# Patient Record
Sex: Female | Born: 1975 | Race: Black or African American | Hispanic: No | Marital: Single | Smoking: Current every day smoker
Health system: Southern US, Community
[De-identification: ages and names within clinical notes are randomized; demographics above are authoritative.]

## PROBLEM LIST (undated history)

## (undated) ENCOUNTER — Ambulatory Visit

## (undated) DIAGNOSIS — K259 Gastric ulcer, unspecified as acute or chronic, without hemorrhage or perforation: Secondary | ICD-10-CM

## (undated) DIAGNOSIS — B9681 Helicobacter pylori [H. pylori] as the cause of diseases classified elsewhere: Secondary | ICD-10-CM

## (undated) DIAGNOSIS — F419 Anxiety disorder, unspecified: Secondary | ICD-10-CM

## (undated) DIAGNOSIS — D649 Anemia, unspecified: Secondary | ICD-10-CM

## (undated) DIAGNOSIS — F4310 Post-traumatic stress disorder, unspecified: Secondary | ICD-10-CM

## (undated) DIAGNOSIS — K279 Peptic ulcer, site unspecified, unspecified as acute or chronic, without hemorrhage or perforation: Secondary | ICD-10-CM

## (undated) DIAGNOSIS — E669 Obesity, unspecified: Secondary | ICD-10-CM

## (undated) DIAGNOSIS — F329 Major depressive disorder, single episode, unspecified: Secondary | ICD-10-CM

## (undated) DIAGNOSIS — E041 Nontoxic single thyroid nodule: Secondary | ICD-10-CM

## (undated) DIAGNOSIS — E785 Hyperlipidemia, unspecified: Secondary | ICD-10-CM

## (undated) DIAGNOSIS — G4730 Sleep apnea, unspecified: Secondary | ICD-10-CM

## (undated) DIAGNOSIS — E559 Vitamin D deficiency, unspecified: Secondary | ICD-10-CM

## (undated) DIAGNOSIS — F32A Depression, unspecified: Secondary | ICD-10-CM

## (undated) DIAGNOSIS — K219 Gastro-esophageal reflux disease without esophagitis: Secondary | ICD-10-CM

## (undated) HISTORY — DX: Sleep apnea, unspecified: G47.30

## (undated) HISTORY — DX: Post-traumatic stress disorder, unspecified: F43.10

## (undated) HISTORY — DX: Gastro-esophageal reflux disease without esophagitis: K21.9

## (undated) HISTORY — DX: Vitamin D deficiency, unspecified: E55.9

## (undated) HISTORY — DX: Nontoxic single thyroid nodule: E04.1

## (undated) HISTORY — DX: Hyperlipidemia, unspecified: E78.5

## (undated) HISTORY — DX: Obesity, unspecified: E66.9

## (undated) HISTORY — DX: Anemia, unspecified: D64.9

## (undated) HISTORY — PX: DILATION AND CURETTAGE OF UTERUS: SHX78

---

## 2004-08-15 ENCOUNTER — Emergency Department: Payer: Self-pay | Admitting: Internal Medicine

## 2004-08-16 ENCOUNTER — Ambulatory Visit: Payer: Self-pay | Admitting: Internal Medicine

## 2004-10-29 ENCOUNTER — Emergency Department: Payer: Self-pay | Admitting: Unknown Physician Specialty

## 2005-05-27 ENCOUNTER — Emergency Department: Payer: Self-pay | Admitting: Emergency Medicine

## 2005-07-30 ENCOUNTER — Emergency Department: Payer: Self-pay | Admitting: Emergency Medicine

## 2006-05-16 ENCOUNTER — Observation Stay: Payer: Self-pay

## 2006-05-22 ENCOUNTER — Observation Stay: Payer: Self-pay

## 2006-05-25 ENCOUNTER — Observation Stay: Payer: Self-pay

## 2006-05-29 ENCOUNTER — Inpatient Hospital Stay: Payer: Self-pay

## 2007-03-05 ENCOUNTER — Emergency Department: Payer: Self-pay | Admitting: Emergency Medicine

## 2007-08-28 ENCOUNTER — Emergency Department: Payer: Self-pay | Admitting: Emergency Medicine

## 2007-11-07 ENCOUNTER — Emergency Department: Payer: Self-pay | Admitting: Emergency Medicine

## 2007-11-09 ENCOUNTER — Emergency Department: Payer: Self-pay | Admitting: Emergency Medicine

## 2008-04-18 ENCOUNTER — Emergency Department: Payer: Self-pay | Admitting: Emergency Medicine

## 2009-02-17 ENCOUNTER — Emergency Department: Payer: Self-pay | Admitting: Emergency Medicine

## 2009-07-06 ENCOUNTER — Emergency Department: Payer: Self-pay | Admitting: Emergency Medicine

## 2010-02-27 ENCOUNTER — Emergency Department: Payer: Self-pay | Admitting: Emergency Medicine

## 2010-03-04 ENCOUNTER — Emergency Department: Payer: Self-pay | Admitting: Emergency Medicine

## 2010-05-04 ENCOUNTER — Emergency Department: Payer: Self-pay | Admitting: Emergency Medicine

## 2010-08-08 ENCOUNTER — Emergency Department: Payer: Self-pay | Admitting: Emergency Medicine

## 2011-04-27 ENCOUNTER — Emergency Department: Payer: Self-pay | Admitting: Unknown Physician Specialty

## 2011-05-02 ENCOUNTER — Emergency Department: Payer: Self-pay | Admitting: Unknown Physician Specialty

## 2011-12-19 ENCOUNTER — Emergency Department: Payer: Self-pay | Admitting: Emergency Medicine

## 2012-06-02 ENCOUNTER — Emergency Department: Payer: Self-pay | Admitting: Emergency Medicine

## 2012-08-30 ENCOUNTER — Emergency Department: Payer: Self-pay | Admitting: Emergency Medicine

## 2012-09-18 ENCOUNTER — Emergency Department: Payer: Self-pay | Admitting: Emergency Medicine

## 2012-10-21 ENCOUNTER — Emergency Department: Payer: Self-pay | Admitting: Emergency Medicine

## 2013-02-23 ENCOUNTER — Emergency Department: Payer: Self-pay | Admitting: Emergency Medicine

## 2013-03-06 ENCOUNTER — Emergency Department: Payer: Self-pay | Admitting: Emergency Medicine

## 2013-07-18 ENCOUNTER — Emergency Department: Payer: Self-pay | Admitting: Emergency Medicine

## 2013-07-18 LAB — COMPREHENSIVE METABOLIC PANEL
Bilirubin,Total: 0.4 mg/dL (ref 0.2–1.0)
Calcium, Total: 9.5 mg/dL (ref 8.5–10.1)
Chloride: 103 mmol/L (ref 98–107)
Creatinine: 0.68 mg/dL (ref 0.60–1.30)
EGFR (Non-African Amer.): >60
Osmolality: 269 (ref 275–301)
Potassium: 3.7 mmol/L (ref 3.5–5.1)
SGOT(AST): 18 U/L (ref 15–37)
SGPT (ALT): 27 U/L (ref 12–78)
Total Protein: 8.6 g/dL — ABNORMAL HIGH (ref 6.4–8.2)

## 2013-07-18 LAB — CBC
HCT: 37.3 % (ref 35.0–47.0)
HGB: 12.3 g/dL (ref 12.0–16.0)
MCV: 82 fL (ref 80–100)
RDW: 17.9 % — ABNORMAL HIGH (ref 11.5–14.5)
WBC: 8.2 10*3/uL (ref 3.6–11.0)

## 2013-07-18 LAB — TROPONIN I: Troponin-I: <0.02 ng/mL

## 2013-11-29 ENCOUNTER — Emergency Department: Payer: Self-pay | Admitting: Emergency Medicine

## 2013-11-29 LAB — RAPID INFLUENZA A&B ANTIGENS

## 2013-12-11 ENCOUNTER — Emergency Department: Payer: Self-pay | Admitting: Emergency Medicine

## 2013-12-11 LAB — CBC WITH DIFFERENTIAL/PLATELET
Basophil #: 0 10*3/uL (ref 0.0–0.1)
Basophil %: 0.7 %
Eosinophil #: 0.2 10*3/uL (ref 0.0–0.7)
Eosinophil %: 2.8 %
HCT: 37.1 % (ref 35.0–47.0)
HGB: 12.1 g/dL (ref 12.0–16.0)
Lymphocyte #: 2 10*3/uL (ref 1.0–3.6)
Lymphocyte %: 30.5 %
MCH: 28.6 pg (ref 26.0–34.0)
MCHC: 32.8 g/dL (ref 32.0–36.0)
MCV: 87 fL (ref 80–100)
MONO ABS: 0.6 x10 3/mm (ref 0.2–0.9)
Monocyte %: 9.4 %
NEUTROS ABS: 3.6 10*3/uL (ref 1.4–6.5)
Neutrophil %: 56.6 %
Platelet: 286 10*3/uL (ref 150–440)
RBC: 4.24 10*6/uL (ref 3.80–5.20)
RDW: 16 % — AB (ref 11.5–14.5)
WBC: 6.4 10*3/uL (ref 3.6–11.0)

## 2013-12-11 LAB — BASIC METABOLIC PANEL
Anion Gap: 5 — ABNORMAL LOW (ref 7–16)
BUN: 6 mg/dL — ABNORMAL LOW (ref 7–18)
CALCIUM: 8.9 mg/dL (ref 8.5–10.1)
Chloride: 103 mmol/L (ref 98–107)
Co2: 29 mmol/L (ref 21–32)
Creatinine: 0.75 mg/dL (ref 0.60–1.30)
EGFR (African American): >60
EGFR (Non-African Amer.): >60
GLUCOSE: 82 mg/dL (ref 65–99)
Osmolality: 271 (ref 275–301)
Potassium: 3.3 mmol/L — ABNORMAL LOW (ref 3.5–5.1)
SODIUM: 137 mmol/L (ref 136–145)

## 2013-12-11 LAB — TROPONIN I: Troponin-I: <0.02 ng/mL

## 2014-01-23 ENCOUNTER — Emergency Department: Payer: Self-pay | Admitting: Internal Medicine

## 2014-01-23 LAB — CBC WITH DIFFERENTIAL/PLATELET
Basophil #: 0 10*3/uL (ref 0.0–0.1)
Basophil %: 0.5 %
EOS ABS: 0.1 10*3/uL (ref 0.0–0.7)
Eosinophil %: 0.8 %
HCT: 36.7 % (ref 35.0–47.0)
HGB: 12 g/dL (ref 12.0–16.0)
LYMPHS ABS: 1.9 10*3/uL (ref 1.0–3.6)
LYMPHS PCT: 21.8 %
MCH: 29.3 pg (ref 26.0–34.0)
MCHC: 32.6 g/dL (ref 32.0–36.0)
MCV: 90 fL (ref 80–100)
Monocyte #: 0.6 x10 3/mm (ref 0.2–0.9)
Monocyte %: 7.3 %
NEUTROS PCT: 69.6 %
Neutrophil #: 6.1 10*3/uL (ref 1.4–6.5)
PLATELETS: 246 10*3/uL (ref 150–440)
RBC: 4.08 10*6/uL (ref 3.80–5.20)
RDW: 16.6 % — ABNORMAL HIGH (ref 11.5–14.5)
WBC: 8.8 10*3/uL (ref 3.6–11.0)

## 2014-01-23 LAB — COMPREHENSIVE METABOLIC PANEL
ALT: 27 U/L (ref 12–78)
AST: 14 U/L — AB (ref 15–37)
Albumin: 3.6 g/dL (ref 3.4–5.0)
Alkaline Phosphatase: 79 U/L
Anion Gap: 5 — ABNORMAL LOW (ref 7–16)
BUN: 6 mg/dL — AB (ref 7–18)
Bilirubin,Total: 0.3 mg/dL (ref 0.2–1.0)
CALCIUM: 8.9 mg/dL (ref 8.5–10.1)
CO2: 28 mmol/L (ref 21–32)
CREATININE: 0.78 mg/dL (ref 0.60–1.30)
Chloride: 105 mmol/L (ref 98–107)
EGFR (African American): >60
EGFR (Non-African Amer.): >60
Glucose: 91 mg/dL (ref 65–99)
Osmolality: 273 (ref 275–301)
POTASSIUM: 3.4 mmol/L — AB (ref 3.5–5.1)
Sodium: 138 mmol/L (ref 136–145)
TOTAL PROTEIN: 7.8 g/dL (ref 6.4–8.2)

## 2014-01-23 LAB — URINALYSIS, COMPLETE
BILIRUBIN, UR: NEGATIVE
Bacteria: NONE SEEN
GLUCOSE, UR: NEGATIVE mg/dL (ref 0–75)
Ketone: NEGATIVE
NITRITE: NEGATIVE
Ph: 6 (ref 4.5–8.0)
RBC,UR: 2 /HPF (ref 0–5)
Specific Gravity: 1.024 (ref 1.003–1.030)

## 2014-01-23 LAB — LIPASE, BLOOD: Lipase: 111 U/L (ref 73–393)

## 2014-02-04 ENCOUNTER — Ambulatory Visit: Payer: Self-pay | Admitting: Gastroenterology

## 2014-02-09 ENCOUNTER — Ambulatory Visit: Payer: Self-pay | Admitting: Gastroenterology

## 2014-02-26 ENCOUNTER — Emergency Department: Payer: Self-pay | Admitting: Emergency Medicine

## 2014-02-26 LAB — CBC
HCT: 38.1 % (ref 35.0–47.0)
HGB: 12.2 g/dL (ref 12.0–16.0)
MCH: 29.2 pg (ref 26.0–34.0)
MCHC: 32 g/dL (ref 32.0–36.0)
MCV: 91 fL (ref 80–100)
Platelet: 252 10*3/uL (ref 150–440)
RBC: 4.18 10*6/uL (ref 3.80–5.20)
RDW: 15.7 % — ABNORMAL HIGH (ref 11.5–14.5)
WBC: 6.6 10*3/uL (ref 3.6–11.0)

## 2014-09-22 ENCOUNTER — Emergency Department: Payer: Self-pay | Admitting: Emergency Medicine

## 2014-09-22 LAB — CBC
HCT: 39.5 % (ref 35.0–47.0)
HGB: 12.5 g/dL (ref 12.0–16.0)
MCH: 29.5 pg (ref 26.0–34.0)
MCHC: 31.7 g/dL — ABNORMAL LOW (ref 32.0–36.0)
MCV: 93 fL (ref 80–100)
PLATELETS: 333 10*3/uL (ref 150–440)
RBC: 4.25 10*6/uL (ref 3.80–5.20)
RDW: 14.8 % — ABNORMAL HIGH (ref 11.5–14.5)
WBC: 11.4 10*3/uL — AB (ref 3.6–11.0)

## 2014-09-22 LAB — COMPREHENSIVE METABOLIC PANEL
ANION GAP: 5 — AB (ref 7–16)
Albumin: 3.6 g/dL (ref 3.4–5.0)
Alkaline Phosphatase: 63 U/L
BUN: 7 mg/dL (ref 7–18)
Bilirubin,Total: 0.3 mg/dL (ref 0.2–1.0)
CHLORIDE: 106 mmol/L (ref 98–107)
CO2: 30 mmol/L (ref 21–32)
Calcium, Total: 8.3 mg/dL — ABNORMAL LOW (ref 8.5–10.1)
Creatinine: 0.84 mg/dL (ref 0.60–1.30)
EGFR (African American): >60
EGFR (Non-African Amer.): >60
Glucose: 104 mg/dL — ABNORMAL HIGH (ref 65–99)
Osmolality: 280 (ref 275–301)
POTASSIUM: 4 mmol/L (ref 3.5–5.1)
SGOT(AST): 13 U/L — ABNORMAL LOW (ref 15–37)
SGPT (ALT): 26 U/L
Sodium: 141 mmol/L (ref 136–145)
Total Protein: 7.2 g/dL (ref 6.4–8.2)

## 2014-09-22 LAB — URINALYSIS, COMPLETE
BACTERIA: NONE SEEN
Bilirubin,UR: NEGATIVE
Blood: NEGATIVE
Glucose,UR: NEGATIVE mg/dL (ref 0–75)
Ketone: NEGATIVE
Leukocyte Esterase: NEGATIVE
NITRITE: NEGATIVE
PH: 7 (ref 4.5–8.0)
Protein: 30
Specific Gravity: 1.021 (ref 1.003–1.030)
Squamous Epithelial: 4
WBC UR: 3 /HPF (ref 0–5)

## 2014-09-22 LAB — LIPASE, BLOOD: LIPASE: 72 U/L — AB (ref 73–393)

## 2014-10-05 ENCOUNTER — Emergency Department: Payer: Self-pay | Admitting: Emergency Medicine

## 2014-10-05 LAB — URINALYSIS, COMPLETE
BILIRUBIN, UR: NEGATIVE
BLOOD: NEGATIVE
Bacteria: NONE SEEN
Glucose,UR: NEGATIVE mg/dL (ref 0–75)
Hyaline Cast: 2
Ketone: NEGATIVE
LEUKOCYTE ESTERASE: NEGATIVE
NITRITE: NEGATIVE
Ph: 5 (ref 4.5–8.0)
Protein: NEGATIVE
RBC,UR: 1 /HPF (ref 0–5)
Specific Gravity: 1.018 (ref 1.003–1.030)
Squamous Epithelial: 4

## 2014-10-05 LAB — CBC
HCT: 38.5 % (ref 35.0–47.0)
HGB: 12.4 g/dL (ref 12.0–16.0)
MCH: 30.1 pg (ref 26.0–34.0)
MCHC: 32.3 g/dL (ref 32.0–36.0)
MCV: 93 fL (ref 80–100)
Platelet: 250 10*3/uL (ref 150–440)
RBC: 4.12 10*6/uL (ref 3.80–5.20)
RDW: 14.5 % (ref 11.5–14.5)
WBC: 6.4 10*3/uL (ref 3.6–11.0)

## 2014-10-05 LAB — HCG, QUANTITATIVE, PREGNANCY

## 2014-10-05 LAB — COMPREHENSIVE METABOLIC PANEL
ALBUMIN: 3.5 g/dL (ref 3.4–5.0)
ALK PHOS: 62 U/L
ANION GAP: 4 — AB (ref 7–16)
AST: 13 U/L — AB (ref 15–37)
BUN: 10 mg/dL (ref 7–18)
Bilirubin,Total: 0.2 mg/dL (ref 0.2–1.0)
Calcium, Total: 9.1 mg/dL (ref 8.5–10.1)
Chloride: 108 mmol/L — ABNORMAL HIGH (ref 98–107)
Co2: 26 mmol/L (ref 21–32)
Creatinine: 0.87 mg/dL (ref 0.60–1.30)
EGFR (African American): >60
EGFR (Non-African Amer.): >60
Glucose: 86 mg/dL (ref 65–99)
OSMOLALITY: 274 (ref 275–301)
Potassium: 4.2 mmol/L (ref 3.5–5.1)
SGPT (ALT): 25 U/L
SODIUM: 138 mmol/L (ref 136–145)
Total Protein: 7 g/dL (ref 6.4–8.2)

## 2014-10-05 LAB — LIPASE, BLOOD: Lipase: 79 U/L (ref 73–393)

## 2014-10-07 LAB — URINE CULTURE

## 2014-10-20 ENCOUNTER — Emergency Department: Payer: Self-pay | Admitting: Emergency Medicine

## 2014-11-03 ENCOUNTER — Emergency Department: Payer: Self-pay | Admitting: Emergency Medicine

## 2014-11-03 LAB — URINALYSIS, COMPLETE
Bilirubin,UR: NEGATIVE
Glucose,UR: NEGATIVE mg/dL (ref 0–75)
KETONE: NEGATIVE
Nitrite: NEGATIVE
PH: 5 (ref 4.5–8.0)
Protein: NEGATIVE
RBC,UR: 2 /HPF (ref 0–5)
SPECIFIC GRAVITY: 1.026 (ref 1.003–1.030)
WBC UR: 17 /HPF (ref 0–5)

## 2014-11-03 LAB — CBC
HCT: 35.6 % (ref 35.0–47.0)
HGB: 11.5 g/dL — AB (ref 12.0–16.0)
MCH: 30.1 pg (ref 26.0–34.0)
MCHC: 32.4 g/dL (ref 32.0–36.0)
MCV: 93 fL (ref 80–100)
Platelet: 214 10*3/uL (ref 150–440)
RBC: 3.84 10*6/uL (ref 3.80–5.20)
RDW: 13.4 % (ref 11.5–14.5)
WBC: 4.7 10*3/uL (ref 3.6–11.0)

## 2014-11-03 LAB — COMPREHENSIVE METABOLIC PANEL
AST: 22 U/L (ref 15–37)
Albumin: 3.4 g/dL (ref 3.4–5.0)
Alkaline Phosphatase: 74 U/L
Anion Gap: 8 (ref 7–16)
BUN: 7 mg/dL (ref 7–18)
Bilirubin,Total: 0.3 mg/dL (ref 0.2–1.0)
Calcium, Total: 8.4 mg/dL — ABNORMAL LOW (ref 8.5–10.1)
Chloride: 112 mmol/L — ABNORMAL HIGH (ref 98–107)
Co2: 23 mmol/L (ref 21–32)
Creatinine: 0.71 mg/dL (ref 0.60–1.30)
EGFR (Non-African Amer.): >60
Glucose: 95 mg/dL (ref 65–99)
Osmolality: 283 (ref 275–301)
POTASSIUM: 3.9 mmol/L (ref 3.5–5.1)
SGPT (ALT): 19 U/L
Sodium: 143 mmol/L (ref 136–145)
Total Protein: 7.4 g/dL (ref 6.4–8.2)

## 2014-11-03 LAB — WET PREP, GENITAL

## 2014-11-03 LAB — LIPASE, BLOOD: Lipase: 89 U/L (ref 73–393)

## 2014-11-03 LAB — GC/CHLAMYDIA PROBE AMP

## 2014-11-03 LAB — TROPONIN I

## 2015-03-20 ENCOUNTER — Other Ambulatory Visit: Payer: Self-pay

## 2015-03-20 ENCOUNTER — Emergency Department: Payer: Medicaid Other

## 2015-03-20 ENCOUNTER — Emergency Department
Admission: EM | Admit: 2015-03-20 | Discharge: 2015-03-20 | Discharge status: Against Medical Advice | Payer: Medicaid Other | Attending: Emergency Medicine | Admitting: Emergency Medicine

## 2015-03-20 ENCOUNTER — Encounter: Payer: Self-pay | Admitting: Emergency Medicine

## 2015-03-20 DIAGNOSIS — R0789 Other chest pain: Secondary | ICD-10-CM | POA: Insufficient documentation

## 2015-03-20 DIAGNOSIS — R102 Pelvic and perineal pain: Secondary | ICD-10-CM | POA: Insufficient documentation

## 2015-03-20 DIAGNOSIS — R1030 Lower abdominal pain, unspecified: Secondary | ICD-10-CM | POA: Diagnosis present

## 2015-03-20 DIAGNOSIS — M25511 Pain in right shoulder: Secondary | ICD-10-CM | POA: Insufficient documentation

## 2015-03-20 DIAGNOSIS — Z720 Tobacco use: Secondary | ICD-10-CM | POA: Diagnosis not present

## 2015-03-20 DIAGNOSIS — Z3202 Encounter for pregnancy test, result negative: Secondary | ICD-10-CM | POA: Insufficient documentation

## 2015-03-20 LAB — URINALYSIS COMPLETE WITH MICROSCOPIC (ARMC ONLY)
BILIRUBIN URINE: NEGATIVE
Glucose, UA: NEGATIVE mg/dL
Ketones, ur: NEGATIVE mg/dL
Nitrite: NEGATIVE
PROTEIN: NEGATIVE mg/dL
SPECIFIC GRAVITY, URINE: 1.018 (ref 1.005–1.030)
pH: 7 (ref 5.0–8.0)

## 2015-03-20 LAB — CBC WITH DIFFERENTIAL/PLATELET
BASOS ABS: 0 10*3/uL (ref 0–0.1)
Basophils Relative: 0 %
Eosinophils Absolute: 0.2 10*3/uL (ref 0–0.7)
Eosinophils Relative: 4 %
HCT: 39.1 % (ref 35.0–47.0)
Hemoglobin: 12.7 g/dL (ref 12.0–16.0)
Lymphocytes Relative: 32 %
Lymphs Abs: 1.9 10*3/uL (ref 1.0–3.6)
MCH: 28.9 pg (ref 26.0–34.0)
MCHC: 32.6 g/dL (ref 32.0–36.0)
MCV: 88.7 fL (ref 80.0–100.0)
MONOS PCT: 10 %
Monocytes Absolute: 0.6 10*3/uL (ref 0.2–0.9)
NEUTROS ABS: 3.2 10*3/uL (ref 1.4–6.5)
Neutrophils Relative %: 54 %
PLATELETS: 253 10*3/uL (ref 150–440)
RBC: 4.4 MIL/uL (ref 3.80–5.20)
RDW: 15 % — ABNORMAL HIGH (ref 11.5–14.5)
WBC: 5.8 10*3/uL (ref 3.6–11.0)

## 2015-03-20 LAB — COMPREHENSIVE METABOLIC PANEL
ALK PHOS: 67 U/L (ref 38–126)
ALT: 17 U/L (ref 14–54)
AST: 15 U/L (ref 15–41)
Albumin: 4.1 g/dL (ref 3.5–5.0)
Anion gap: 5 (ref 5–15)
BUN: 8 mg/dL (ref 6–20)
CHLORIDE: 103 mmol/L (ref 101–111)
CO2: 28 mmol/L (ref 22–32)
CREATININE: 0.74 mg/dL (ref 0.44–1.00)
Calcium: 9.1 mg/dL (ref 8.9–10.3)
GFR calc Af Amer: >60 mL/min (ref >60)
GFR calc non Af Amer: >60 mL/min (ref >60)
Glucose, Bld: 101 mg/dL — ABNORMAL HIGH (ref 65–99)
Potassium: 4 mmol/L (ref 3.5–5.1)
Sodium: 136 mmol/L (ref 135–145)
Total Bilirubin: 0.3 mg/dL (ref 0.3–1.2)
Total Protein: 7.6 g/dL (ref 6.5–8.1)

## 2015-03-20 LAB — TROPONIN I

## 2015-03-20 LAB — LIPASE, BLOOD: LIPASE: 32 U/L (ref 22–51)

## 2015-03-20 LAB — POCT PREGNANCY, URINE: PREG TEST UR: NEGATIVE

## 2015-03-20 NOTE — ED Notes (Signed)
Patient to ED with c/o lower abdominal cramping for about the last 3 days, now along with right shoulder pain radiating through to back.

## 2015-03-20 NOTE — ED Provider Notes (Signed)
[facility] Emergency Department Provider Note  ____________________________________________  Time seen: 1825  I have reviewed the triage vital signs and the nursing notes.   HISTORY  Chief Complaint Abdominal Cramping and Shoulder Pain     HPI Laurie Frederick is a 39 y.o. female who reports suprapubic discomfort over the past 2 days. She reports it feels like her menstrual cycle is "trying to come on". She has a little bit of brown discharge but if she is not bleeding yet. She feels some cramping and discomfort.  In addition to this she says today she started feeling pain in her right chest through to her right upper back. It hurts when she moves her arm. It hurts when she moves her neck. She is tender on palpation.  She denies fever. She is not having any nausea or vomiting.   History reviewed. No pertinent past medical history.  There are no active problems to display for this patient.   History reviewed. No pertinent past surgical history.  No current outpatient prescriptions on file.  Allergies Review of patient's allergies indicates no known allergies.  History reviewed. No pertinent family history.  Social History History  Substance Use Topics  . Smoking status: Current Some Day Smoker  . Smokeless tobacco: Never Used  . Alcohol Use: Yes     Comment: occasional    Review of Systems  Constitutional: Negative for fever. ENT: Negative for sore throat. Cardiovascular: Chest pain right chest. Respiratory: Negative for shortness of breath. Gastrointestinal: Negative for abdominal pain, vomiting and diarrhea. Genitourinary: Suprapubic discomfort with brown vaginal discharge. Musculoskeletal: Negative for back pain. Skin: Negative for rash. Neurological: Negative for headaches   10-point ROS otherwise negative.  ____________________________________________   PHYSICAL EXAM:  VITAL SIGNS: ED Triage Vitals  Enc Vitals Group      BP 03/20/15 1540 128/82 mmHg     Pulse Rate 03/20/15 1540 82     Resp 03/20/15 1540 20     Temp 03/20/15 1540 98.2 F (36.8 C)     Temp Source 03/20/15 1540 Oral     SpO2 03/20/15 1540 100 %     Weight 03/20/15 1540 189 lb (85.73 kg)     Height 03/20/15 1540 5\' 3"  (1.6 m)     Head Cir --      Peak Flow --      Pain Score 03/20/15 1541 8     Pain Loc --      Pain Edu? --      Excl. in GC? --     Constitutional: Alert and oriented. Well appearing and in no distress. ENT   Head: Normocephalic and atraumatic.   Nose: No congestion/rhinnorhea.   Mouth/Throat: Mucous membranes are moist. Cardiovascular: Normal rate, regular rhythm. Chest: Mild tenderness to the right upper chest. Respiratory: Normal respiratory effort without tachypnea. Breath sounds are clear and equal bilaterally. No wheezes/rales/rhonchi. Gastrointestinal: Soft and nontender. No distention.  Back: There is no CVA tenderness. There is muscle spasm and tenderness in the right upper back along the paraspinal muscles. Musculoskeletal: Nontender with normal range of motion in all extremities.  No noted edema. Neurologic:  Normal speech and language. No gross focal neurologic deficits are appreciated.  Skin:  Skin is warm, dry. No rash noted. Psychiatric: Mood and affect are normal. Speech and behavior are normal.  ____________________________________________    LABS (pertinent positives/negatives)  White blood cell 5.8 hemoglobin of 12.7 metabolic panel is within normal limits with normal renal function lipase  is normal at 32,  troponin is negative at less than 0.03. Pregnancy test is negative. Urinalysis is normal with 0-5 white blood cells 0-5 red blood cells 0-5 squamous epithelial cells. ____________________________________________   EKG  EKG obtained at 1553. This was interpreted by me. The rate is 71 with normal sinus rhythm with a sinus arrhythmia. Intervals and axis are normal. No ST  elevation or depression. Normal EKG.  ____________________________________________    RADIOLOGY  Chest x-ray IMPRESSION: No acute cardiopulmonary process.   ____________________________________________   INITIAL IMPRESSION / ASSESSMENT AND PLAN / ED COURSE  Well-appearing female whose menstrual cycle appears to be delayed. She says it's time for her cycle to come on but for 2 days she has had some cramping and discomfort. She is not pregnant her blood tests look good. She does have tenderness in the right chest and right upper back. This is most likely musculoskeletal but we will check a chest x-ray.  ----------------------------------------- 7:58 PM on 03/20/2015 -----------------------------------------  The patient eloped from the emergency department prior to me rechecking her and giving her report on her chest x-ray.  I do believe she was stable. I discussed follow-up with gynecology with her. She goes [facility] and I was going to refer her to [facility] as well as. I will ask our resource nurse to call the patient and make sure that she is doing alright.  ____________________________________________   FINAL CLINICAL IMPRESSION(S) / ED DIAGNOSES  Final diagnoses:  Pelvic pain in female  Chest wall pain      Darien Ramus, MD 03/20/15 2001

## 2015-03-20 NOTE — ED Notes (Signed)
Pt not in room. Pt not in bathroom or in xray. Gown on bed.

## 2015-03-22 ENCOUNTER — Telehealth: Payer: Self-pay | Admitting: Emergency Medicine

## 2015-03-31 ENCOUNTER — Emergency Department
Admission: EM | Admit: 2015-03-31 | Discharge: 2015-04-01 | Disposition: A | Discharge status: Home / Self Care | Payer: Medicaid Other | Attending: Emergency Medicine | Admitting: Emergency Medicine

## 2015-03-31 ENCOUNTER — Encounter: Payer: Self-pay | Admitting: *Deleted

## 2015-03-31 DIAGNOSIS — T43222A Poisoning by selective serotonin reuptake inhibitors, intentional self-harm, initial encounter: Secondary | ICD-10-CM | POA: Insufficient documentation

## 2015-03-31 DIAGNOSIS — Y9289 Other specified places as the place of occurrence of the external cause: Secondary | ICD-10-CM | POA: Diagnosis not present

## 2015-03-31 DIAGNOSIS — F32A Depression, unspecified: Secondary | ICD-10-CM

## 2015-03-31 DIAGNOSIS — Z79899 Other long term (current) drug therapy: Secondary | ICD-10-CM | POA: Diagnosis not present

## 2015-03-31 DIAGNOSIS — F1010 Alcohol abuse, uncomplicated: Secondary | ICD-10-CM | POA: Diagnosis not present

## 2015-03-31 DIAGNOSIS — Z720 Tobacco use: Secondary | ICD-10-CM | POA: Diagnosis not present

## 2015-03-31 DIAGNOSIS — Y9389 Activity, other specified: Secondary | ICD-10-CM | POA: Diagnosis not present

## 2015-03-31 DIAGNOSIS — F321 Major depressive disorder, single episode, moderate: Secondary | ICD-10-CM

## 2015-03-31 DIAGNOSIS — Y998 Other external cause status: Secondary | ICD-10-CM | POA: Insufficient documentation

## 2015-03-31 DIAGNOSIS — T43592A Poisoning by other antipsychotics and neuroleptics, intentional self-harm, initial encounter: Secondary | ICD-10-CM | POA: Diagnosis not present

## 2015-03-31 DIAGNOSIS — F331 Major depressive disorder, recurrent, moderate: Secondary | ICD-10-CM | POA: Diagnosis not present

## 2015-03-31 DIAGNOSIS — F329 Major depressive disorder, single episode, unspecified: Secondary | ICD-10-CM

## 2015-03-31 DIAGNOSIS — X58XXXA Exposure to other specified factors, initial encounter: Secondary | ICD-10-CM | POA: Insufficient documentation

## 2015-03-31 DIAGNOSIS — T43502A Poisoning by unspecified antipsychotics and neuroleptics, intentional self-harm, initial encounter: Secondary | ICD-10-CM

## 2015-03-31 HISTORY — DX: Anxiety disorder, unspecified: F41.9

## 2015-03-31 LAB — COMPREHENSIVE METABOLIC PANEL
ALT: 18 U/L (ref 14–54)
ANION GAP: 10 (ref 5–15)
AST: 16 U/L (ref 15–41)
Albumin: 4.1 g/dL (ref 3.5–5.0)
Alkaline Phosphatase: 77 U/L (ref 38–126)
BUN: 6 mg/dL (ref 6–20)
CALCIUM: 9.2 mg/dL (ref 8.9–10.3)
CO2: 25 mmol/L (ref 22–32)
Chloride: 108 mmol/L (ref 101–111)
Creatinine, Ser: 0.68 mg/dL (ref 0.44–1.00)
GFR calc Af Amer: >60 mL/min (ref >60)
GFR calc non Af Amer: >60 mL/min (ref >60)
Glucose, Bld: 96 mg/dL (ref 65–99)
POTASSIUM: 3.4 mmol/L — AB (ref 3.5–5.1)
Sodium: 143 mmol/L (ref 135–145)
TOTAL PROTEIN: 7.4 g/dL (ref 6.5–8.1)
Total Bilirubin: <0.1 mg/dL — ABNORMAL LOW (ref 0.3–1.2)

## 2015-03-31 LAB — CBC
HCT: 36.1 % (ref 35.0–47.0)
HEMOGLOBIN: 11.8 g/dL — AB (ref 12.0–16.0)
MCH: 28.9 pg (ref 26.0–34.0)
MCHC: 32.6 g/dL (ref 32.0–36.0)
MCV: 88.5 fL (ref 80.0–100.0)
PLATELETS: 239 10*3/uL (ref 150–440)
RBC: 4.08 MIL/uL (ref 3.80–5.20)
RDW: 14.6 % — ABNORMAL HIGH (ref 11.5–14.5)
WBC: 6 10*3/uL (ref 3.6–11.0)

## 2015-03-31 LAB — SALICYLATE LEVEL: Salicylate Lvl: <4 mg/dL (ref 2.8–30.0)

## 2015-03-31 LAB — ETHANOL: Alcohol, Ethyl (B): 161 mg/dL — ABNORMAL HIGH (ref <5)

## 2015-03-31 LAB — ACETAMINOPHEN LEVEL: Acetaminophen (Tylenol), Serum: <10 ug/mL — ABNORMAL LOW (ref 10–30)

## 2015-03-31 LAB — GLUCOSE, CAPILLARY: Glucose-Capillary: 84 mg/dL (ref 65–99)

## 2015-03-31 MED ORDER — NICOTINE 10 MG IN INHA: 1.0000 | RESPIRATORY_TRACT | Status: DC | PRN

## 2015-03-31 MED ORDER — SODIUM CHLORIDE 0.9 % IV BOLUS (SEPSIS)
1000.0000 mL | Freq: Once | INTRAVENOUS | Status: AC
  Administered 2015-03-31: 1000 mL via INTRAVENOUS

## 2015-03-31 NOTE — ED Notes (Signed)
Spoke with Merdis DelayBernice from [employer], gave update on patient.
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
## 2015-03-31 NOTE — ED Notes (Signed)
Sandwich tray provided to patient

## 2015-03-31 NOTE — ED Notes (Addendum)

## 2015-03-31 NOTE — ED Notes (Signed)
BEHAVIORAL HEALTH ROUNDING Patient sleeping: Yes.   Patient alert and oriented: not applicable Behavior appropriate: No.; If no, describe: Pt extremely drowsy, arousable to verbal stimulations, voices SI.  Nutrition and fluids offered: No Toileting and hygiene offered: No Sitter present: yes, 1 to 1 sitter present at bedside. Law enforcement present: No

## 2015-03-31 NOTE — ED Notes (Signed)
ENVIRONMENTAL ASSESSMENT Potentially harmful objects out of patient reach: Yes.   Personal belongings secured: Yes.   Patient dressed in hospital provided attire only: No. Plastic bags out of patient reach: Yes.   Patient care equipment (cords, cables, call bells, lines, and drains) shortened, removed, or accounted for: Yes.   Equipment and supplies removed from bottom of stretcher: Yes.   Potentially toxic materials out of patient reach: Yes.   Sharps container removed or out of patient reach: Yes.      Pt is to be medically cleared, pt has 1 to 1 sitter at bedside.

## 2015-03-31 NOTE — ED Notes (Signed)
Pt states " I have never done this before, but I have been having relationship problems and I lost my job this morning. I tried to kill myself and took 7 Celexa". Pt reports taking Celexa 50 mg approx 1 hour ago.

## 2015-03-31 NOTE — ED Provider Notes (Signed)
[facility] Emergency Department Provider Note  ____________________________________________  Time seen: Approximately 4:04 PM  I have reviewed the triage vital signs and the nursing notes.   HISTORY  Chief Complaint Ingestion    HPI Laurie Frederick is a 39 y.o. female who reports intentionally overdosing on cervical and Celexa because of severe depression related to loss of her job this week.  Patient states she began drinking alcohol approximately 3 shots this morning, she then took approximately 10 Celexa and Seroquel over dose attempt.  She denies pain, she states she is tired, and feels depressed. She denies ingestion on any other substances. She denies Tylenol.  No fever. No headaches. She doesn't have a history of suicide, though she has thought about it previously due to depression.   Past Medical History  Diagnosis Date  . Anxiety     There are no active problems to display for this patient.   History reviewed. No pertinent past surgical history.  Current Outpatient Rx  Name  Route  Sig  Dispense  Refill  . alprazolam (XANAX) 2 MG tablet   Oral   Take 2 mg by mouth 2 (two) times daily.         . citalopram (CELEXA) 20 MG tablet   Oral   Take 20 mg by mouth daily.         . QUEtiapine (SEROQUEL) 50 MG tablet   Oral   Take 150 mg by mouth at bedtime.          Marland Kitchen zolpidem (AMBIEN) 10 MG tablet   Oral   Take 10 mg by mouth at bedtime.           Allergies Review of patient's allergies indicates no known allergies.  No family history on file.  Social History History  Substance Use Topics  . Smoking status: Current Some Day Smoker  . Smokeless tobacco: Never Used  . Alcohol Use: Yes     Comment: occasional    Review of Systems Constitutional: No fever/chills Eyes: No visual changes. ENT: No sore throat. Cardiovascular: Denies chest pain. Respiratory: Denies shortness of breath. Gastrointestinal: No  abdominal pain.  No nausea, no vomiting.  No diarrhea.  No constipation. Genitourinary: Negative for dysuria. Musculoskeletal: Negative for back pain. Skin: Negative for rash. Neurological: Negative for headaches, focal weakness or numbness. Patient reports she feels sleepy.  Positive for suicidal. Her abdominal pain which she had last week is better, she denies pregnancy.  10-point ROS otherwise negative.  ____________________________________________   PHYSICAL EXAM:  VITAL SIGNS: ED Triage Vitals  Enc Vitals Group     BP 03/31/15 1512 119/65 mmHg     Pulse Rate 03/31/15 1512 132     Resp 03/31/15 1512 14     Temp 03/31/15 1512 98.5 F (36.9 C)     Temp Source 03/31/15 1512 Oral     SpO2 03/31/15 1512 96 %     Weight 03/31/15 1512 189 lb (85.73 kg)     Height 03/31/15 1512  (1.6 m)     Head Cir --      Peak Flow --      Pain Score --      Pain Loc --      Pain Edu? --      Excl. in GC? --     Constitutional: Alert and oriented. Somnolent, arouses to voice and is able to follow conversation. Eyes: Conjunctivae are normal. Miosis. EOMI. there is lateral gaze nystagmus Head:  Atraumatic. Nose: No congestion/rhinnorhea. Mouth/Throat: Mucous membranes are slightly dry.  Oropharynx non-erythematous. Neck: No stridor.   Cardiovascular: Normal rate, regular rhythm. Grossly normal heart sounds.  Good peripheral circulation. Respiratory: Normal respiratory effort.  No retractions. Lungs CTAB. Gastrointestinal: Soft and nontender. No distention. No abdominal bruits. No CVA tenderness. Musculoskeletal: No lower extremity tenderness nor edema.  No joint effusions. Neurologic:  Normal speech and language. No gross focal neurologic deficits are appreciated. Speech is normal. No gait instability. Skin:  Skin is warm, dry and intact. No rash noted. Psychiatric: Mood and affect are normal. Speech and behavior are normal.  ____________________________________________   LABS (all  labs ordered are listed, but only abnormal results are displayed)  Labs Reviewed  CBC - Abnormal; Notable for the following:    Hemoglobin 11.8 (*)    RDW 14.6 (*)    All other components within normal limits  COMPREHENSIVE METABOLIC PANEL - Abnormal; Notable for the following:    Potassium 3.4 (*)    Total Bilirubin <0.1 (*)    All other components within normal limits  ETHANOL - Abnormal; Notable for the following:    Alcohol, Ethyl (B) 161 (*)    All other components within normal limits  ACETAMINOPHEN LEVEL - Abnormal; Notable for the following:    Acetaminophen (Tylenol), Serum <10 (*)    All other components within normal limits  SALICYLATE LEVEL  GLUCOSE, CAPILLARY  URINE DRUG SCREEN, QUALITATIVE ([facility] ONLY)  PREGNANCY, URINE   ____________________________________________  EKG  ED ECG REPORT I, QUALE, MARK, the attending physician, personally viewed and interpreted this ECG.   Date: 03/31/2015  EKG Time: 1525  Rate: 120  Rhythm: Sinus tachycardia, normal QTC, LVH by voltage criteria, QRS 80   Intervals: Sinus tachycardia  ST&T Change: No acute ST abdomen mildly  ECG does not demonstrate QTC prolongation or terminal R-wave elevation or QRS widening.   EKG performed at 1640 normal sinus rhythm. No terminal R-wave elevation, normal QRS, QTc slightly decreased to 440. No acute ischemic change ____________________________________________  RADIOLOGY   ____________________________________________   PROCEDURES  Procedure(s) performed: None  Critical Care performed: No  ____________________________________________   INITIAL IMPRESSION / ASSESSMENT AND PLAN / ED COURSE  Pertinent labs & imaging results that were available during my care of the patient were reviewed by me and considered in my medical decision making (see chart for details).  Patient presents after reported ingestion of Seroquel and Celexa. Patient does show evidence of toxicologic effect,  including myosis, dry membranes, and tachycardia. She does not demonstrate hypotension. She does have somnolence. We will treat her with supportive care, evaluate for ASA and APAP levels. I have placed her under involuntary commitment. We will discuss with poison control, regarding ongoing recommendations and care. QTC is normal, EKG does not show any acute electrolyte abnormality at present.  ----------------------------------------- 4:15 PM on 03/31/2015 -----------------------------------------  Called and discussed with Bernice at poison control. They recommended a 6 hour observation period, we will continue to monitor for tachycardia, mental status changes, and QT prolongation. I will obtain a repeat EKG now. Clinically, I suspect the patient will begin to improve, however we will need to monitor closely for evidence of overdose.  ----------------------------------------- 5:29 PM on 03/31/2015 -----------------------------------------  Tachycardia improving, patient remained somnolent at this time. No acute distress. Repeat EKG does not demonstrate QT prolongation or terminal R-wave elevation. ____________________________________________  ----------------------------------------- 7:59 PM on 03/31/2015 -----------------------------------------  She clinically improving. Vital signs stable. At this point, patient is being transferred  to the care of Dr. Scotty CourtStafford on the other side of the emergency room. The patient will remain on telemetry monitoring and continue for observation for medical clearance. Plan is to observe her until at least 10 PM. Discussed plan of care and transfer with Dr. Scotty CourtStafford at this time.   FINAL CLINICAL IMPRESSION(S) / ED DIAGNOSES  overdose, intentional, on anti-psychotic Suicidal ideation, initial, acute    Sharyn CreamerMark Quale, MD 03/31/15 2000

## 2015-03-31 NOTE — ED Notes (Addendum)
BEHAVIORAL HEALTH ROUNDING Patient sleeping: Yes.   Patient alert and oriented: N/A Behavior appropriate: Yes.  ; If no, describe:  Nutrition and fluids offered: No Toileting and hygiene offered: No Sitter present: yes Law enforcement present: No

## 2015-03-31 NOTE — ED Notes (Signed)
ENVIRONMENTAL ASSESSMENT Potentially harmful objects out of patient reach: YES Personal belongings secured: YES Patient dressed in hospital provided attire only: YES Plastic bags out of patient reach: YES Patient care equipment (cords, cables, call bells, lines, and drains) shortened, removed, or accounted for: NO, patient not medically cleared at this time. Equipment and supplies removed from bottom of stretcher: YES Potentially toxic materials out of patient reach: [place] container removed or out of patient reach: YES

## 2015-03-31 NOTE — ED Notes (Signed)
Sitter placed at bedside 

## 2015-03-31 NOTE — ED Notes (Signed)

## 2015-03-31 NOTE — ED Provider Notes (Signed)
-----------------------------------------   11:09 PM on 03/31/2015 -----------------------------------------  Patient is remained hemodynamically stable with normal vital signs since arrival in the ED. Mental status is intact.   Repeat EKG at 2135 is as follows: Interpreted by me  Date: 03/31/2015  Rate: 89  Rhythm: normal sinus rhythm  QRS Axis: normal  Intervals: normal  ST/T Wave abnormalities: normal  Conduction Disutrbances: none  Narrative Interpretation: unremarkable  At this time the patient is medically cleared and stable. She'll remain under involuntary commitment pending psychiatric evaluation due to her high risk polysubstance abuse behavior.  Clinical impression  polysubstance abuse Sedative overdose     Sharman CheekPhillip Barbarita Hutmacher, MD 03/31/15 2311

## 2015-03-31 NOTE — ED Notes (Signed)
ED BHU PLACEMENT JUSTIFICATION Is the patient under IVC or is there intent for IVC: Yes.   Is the patient medically cleared: No. Is there vacancy in the ED BHU: Yes Is the population mix appropriate for patient: No. Is the patient awaiting placement in inpatient or outpatient setting: No. Has the patient had a psychiatric consult: No. Survey of unit performed for contraband, proper placement and condition of furniture, tampering with fixtures in bathroom, shower, and each patient room: Yes.  ; Findings:  APPEARANCE/BEHAVIOR Pt alert to verbal stimulation, unable to hold conversation due to pt drowsiness.  NEURO ASSESSMENT Orientation: time, place and person Hallucinations: No.None noted (Hallucinations) Speech: Slurred Gait: unable to stand RESPIRATORY ASSESSMENT Normal expansion.  Clear to auscultation.  No rales, rhonchi, or wheezing. CARDIOVASCULAR ASSESSMENT regular rate and rhythm, S1, S2 normal, no murmur, click, rub or gallop GASTROINTESTINAL ASSESSMENT soft, nontender, BS WNL, no r/g EXTREMITIES normal strength, tone, and muscle mass PLAN OF CARE Provide calm/safe environment. Vital signs assessed twice daily. ED BHU Assessment once each 12-hour shift. Collaborate with intake RN daily or as condition indicates. Assure the ED provider has rounded once each shift. Provide and encourage hygiene. Provide redirection as needed. Assess for escalating behavior; address immediately and inform ED provider.  Assess family dynamic and appropriateness for visitation as needed: Yes.  ; If necessary, describe findings:  Educate the patient/family about BHU procedures/visitation: No.; If necessary, describe findings: Unable to do pt teaching at this time due to pt drowsiness.

## 2015-03-31 NOTE — ED Notes (Signed)
BEHAVIORAL HEALTH ROUNDING Patient sleeping: Yes.   Patient alert and oriented: not applicable Behavior appropriate: Yes.  ; If no, describe:  Nutrition and fluids offered: No Toileting and hygiene offered: No Sitter present: yes Law enforcement present: No 

## 2015-03-31 NOTE — ED Notes (Signed)
Patient reports drinking 16 oz beers, 3 liquor airplane bottle, and 7 Seroquel one hour PTA. Patient reports she drove herself to hospital. Patient appears slightly more alert. Patient requesting something to eat or drink.

## 2015-04-01 DIAGNOSIS — F331 Major depressive disorder, recurrent, moderate: Secondary | ICD-10-CM

## 2015-04-01 DIAGNOSIS — F321 Major depressive disorder, single episode, moderate: Secondary | ICD-10-CM

## 2015-04-01 DIAGNOSIS — F1010 Alcohol abuse, uncomplicated: Secondary | ICD-10-CM

## 2015-04-01 LAB — PREGNANCY, URINE: PREG TEST UR: NEGATIVE

## 2015-04-01 LAB — URINE DRUG SCREEN, QUALITATIVE (ARMC ONLY)
Amphetamines, Ur Screen: NOT DETECTED
Barbiturates, Ur Screen: NOT DETECTED
Benzodiazepine, Ur Scrn: POSITIVE — AB
COCAINE METABOLITE, UR ~~LOC~~: NOT DETECTED
Cannabinoid 50 Ng, Ur ~~LOC~~: NOT DETECTED
MDMA (Ecstasy)Ur Screen: NOT DETECTED
Methadone Scn, Ur: NOT DETECTED
OPIATE, UR SCREEN: POSITIVE — AB
PHENCYCLIDINE (PCP) UR S: NOT DETECTED
TRICYCLIC, UR SCREEN: POSITIVE — AB

## 2015-04-01 NOTE — ED Notes (Signed)
BEHAVIORAL HEALTH ROUNDING Patient sleeping: No. Patient alert and oriented: yes Behavior appropriate: Yes.  ; If no, describe:  Nutrition and fluids offered: yes Toileting and hygiene offered: Yes  Sitter present: q15 minute observations and security camera monitoring Law enforcement present: Yes  ODS  

## 2015-04-01 NOTE — ED Notes (Signed)
BEHAVIORAL HEALTH ROUNDING Patient sleeping: Yes.   Patient alert and oriented: sleeping Behavior appropriate: Yes.  ; If no, describe: sleeping Nutrition and fluids offered: sleeping Toileting and hygiene offered: sleeping Sitter present: no Law enforcement present: Yes  and ODS 

## 2015-04-01 NOTE — Discharge Instructions (Signed)
Depression Depression is feeling sad, low, down in the dumps, blue, gloomy, or empty. In general, there are two kinds of depression:  Normal sadness or grief. This can happen after something upsetting. It often goes away on its own within 2 weeks. After losing a loved one (bereavement), normal sadness and grief may last longer than two weeks. It usually gets better with time.  Clinical depression. This kind lasts longer than normal sadness or grief. It keeps you from doing the things you normally do in life. It is often hard to function at home, work, or at school. It may affect your relationships with others. Treatment is often needed. GET HELP RIGHT AWAY IF:  You have thoughts about hurting yourself or others.  You lose touch with reality (psychotic symptoms). You may:  See or hear things that are not real.  Have untrue beliefs about your life or people around you.  Your medicine is giving you problems. MAKE SURE YOU:  Understand these instructions.  Will watch your condition.  Will get help right away if you are not doing well or get worse. Document Released: 11/18/2010 Document Revised: 03/02/2014 Document Reviewed: 02/15/2012 [facility] Patient Information 2015 [employer] [place]. This information is not intended to replace advice given to you by your health care provider. Make sure you discuss any questions you have with your health care provider.    Please follow-up at [facility] as scheduled. Return to the emergency department for any [place] concerning symptoms or curative having any thoughts of hurting himself or anyone else.

## 2015-04-01 NOTE — ED Notes (Signed)
Patient assigned to appropriate care area. Patient oriented to unit/care area: Informed that, for their safety, care areas are designed for safety and monitored by security cameras at all times; and visiting hours explained to patient. Patient verbalizes understanding, and verbal contract for safety obtained. 

## 2015-04-01 NOTE — ED Notes (Signed)

## 2015-04-01 NOTE — ED Notes (Signed)
I went in and spoke with pt about the keys - pt agreed to let mom have the house key  I went in under camera and emptied the bag in plain view until the keys were located in the shirt pocket  - i took the only two keys in the bag into the pts room  She watched me take the car key off the ring and the house key remained  - i placed the car key back in the shirt pocket and then went to the lobby with the house key only

## 2015-04-01 NOTE — ED Notes (Signed)
No am meds ordered at this time   Assessment completed  Denies pain

## 2015-04-01 NOTE — ED Notes (Signed)
Took house key to lobby - mother of pt is furious in the lobby stating  "We took a cab here and we must have the car keys - i have to get her childrens to school."

## 2015-04-01 NOTE — ED Notes (Signed)
Lunch provided  Pt observed with no unusual behavior  Appropriate to stimulation  No verbalized needs or concerns at this time  NAD assessed  Continue to monitor 

## 2015-04-01 NOTE — ED Notes (Signed)

## 2015-04-01 NOTE — ED Provider Notes (Signed)
-----------------------------------------   6:25 AM on 04/01/2015 -----------------------------------------   BP 120/66 mmHg  Pulse 98  Temp(Src) 98.5 F (36.9 C) (Oral)  Resp 18  Ht 5\' 3"  (1.6 m)  Wt 189 lb (85.73 kg)  BMI 33.49 kg/m2  SpO2 99%  LMP 03/21/2015  The patient had no acute events since last update.  Calm and cooperative at this time.  Disposition is pending per Psychiatry/Behavioral Medicine team recommendations.     Irean HongJade J Shelle Galdamez, MD 04/01/15 [phone]

## 2015-04-01 NOTE — ED Notes (Signed)
Pt with a visitor (her mother) at this time  - visit observed by Alfredia Clientmary jo

## 2015-04-01 NOTE — ED Notes (Signed)
Pt with another phone call

## 2015-04-01 NOTE — ED Notes (Signed)
As I entered the unit to ask about the car keys - this pt is at the door telling security and the tech  "I want to order some food  - I know I can't have [place] but damn - I need something else."  Pt informed that I can order another breakfast tray but most likely it will be another biscuit   She state  "Just forget it - this is a set up - ya'll aren't telling me the truth."  Pt shown her IVC papers and intake note at this time  Reassured that I am her advocate  Finally asked her about the keys and she stated "they took a cab here - that is messed up  - yes give my momma the keys."  Pt then walked to her room

## 2015-04-01 NOTE — ED Notes (Signed)
ED BHU PLACEMENT JUSTIFICATION Is the patient under IVC or is there intent for IVC: Yes.   Is the patient medically cleared: Yes.   Is there vacancy in the ED BHU: Yes.   Is the population mix appropriate for patient: Yes.   Is the patient awaiting placement in inpatient or outpatient setting: Yes.   Has the patient had a psychiatric consult: No.  to be performed in the morning. Survey of unit performed for contraband, proper placement and condition of furniture, tampering with fixtures in bathroom, shower, and each patient room: Yes.  ; Findings:  APPEARANCE/BEHAVIOR calm, cooperative and adequate rapport can be established NEURO ASSESSMENT Orientation: time, place and person Hallucinations: No.None noted (Hallucinations) Speech: Normal Gait: normal RESPIRATORY ASSESSMENT Normal expansion.  Clear to auscultation.  No rales, rhonchi, or wheezing. CARDIOVASCULAR ASSESSMENT regular rate and rhythm, S1, S2 normal, no murmur, click, rub or gallop GASTROINTESTINAL ASSESSMENT soft, nontender, BS WNL, no r/g EXTREMITIES normal strength, tone, and muscle mass PLAN OF CARE Provide calm/safe environment. Vital signs assessed twice daily. ED BHU Assessment once each 12-hour shift. Collaborate with intake RN daily or as condition indicates. Assure the ED provider has rounded once each shift. Provide and encourage hygiene. Provide redirection as needed. Assess for escalating behavior; address immediately and inform ED provider.  Assess family dynamic and appropriateness for visitation as needed: Yes.  ; If necessary, describe findings:  Educate the patient/family about BHU procedures/visitation: Yes.  ; If necessary, describe findings:

## 2015-04-01 NOTE — ED Notes (Signed)
Breakfast provided   Plan of care discussed - pt stating "the doctor is gonna be here at 0900 today and I will be going home cause I am all right - I already have a psychiatrist I don't need to see one here."   IVC defined and pt informed that the psychiatrist is the only person that can release her from the ED and that he usually consults in the afternoon 12-1 but yesterday it was 2-3   "i want to call my momma so she can bring me some breakfast"  Pt informed that we can only allow the food from our dietary svcs to enter our unit - "Then I want to call down there and order something."  Pt told that food that enters this unit is special and it must be handheld no utensils and no straws   "This is jail - this is bullshit - I am gonna get angry."  Encouraged pt to work with us in her care

## 2015-04-01 NOTE — ED Provider Notes (Signed)
-----------------------------------------   1:48 PM on 04/01/2015 -----------------------------------------  Patient has been seen and evaluated by psychiatry. They state the [facility] has [facility], and they believe the patient to be safe for discharge.  Minna AntisKevin Beautifull Cisar, MD 04/01/15 1348

## 2015-04-01 NOTE — ED Notes (Signed)
Pt received a phone call  -   Appropriate to stimulation  No verbalized needs or concerns at this time  NAD assessed  Continue to monitor

## 2015-04-01 NOTE — ED Notes (Signed)
Pt observed lying in bed with all the lights on and the door open  - no verbalized needs or concerns at this time  Appropriate to stimulation  NAD assessed  Continue to monitor

## 2015-04-01 NOTE — ED Notes (Signed)
Report given Janeece RiggersAndrea RN. Patient in stable condition prior. Patient moved to ED BHU2.

## 2015-04-01 NOTE — ED Notes (Signed)
Car key provided to mother

## 2015-04-01 NOTE — ED Notes (Signed)

## 2015-04-01 NOTE — Consult Note (Signed)
[facility] Consult   Reason for Consult:  Consult for this 39 year old woman with a history of depression who presented to the emergency room after taking an overdose Referring Physician:  gayle Patient Identification: Laurie Frederick MRN:  [id number] Principal Diagnosis: Moderate major depression Diagnosis:   Patient Active Problem List   Diagnosis Date Noted  . 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 Alcohol abuse [F10.10] 04/01/2015    Total Time spent with patient: 1 hour  Subjective:   Bernardina Cacho is a 39 y.o. female patient admitted with "it was a stupid act". Patient presented voluntarily after taking an overdose of prescription medicine.Marland Kitchen  HPI:  Information from the patient and the chart. Patient states that last night she was feeling very depressed and down. She impulsively went to the liquor store and got several singleserving bottles of liquor and also had some beer. After drinking and she took an overdose of her Seroquel took quite a few of her Seroquel tablets. Sometime after doing it she spoke to her daughter on the phone and was overwhelmed with remorse for what she had done. The patient actually drove herself to the emergency room. She states that she has a long history of depression going back about 15 or 16 years. Up until the last couple days she had felt like her mood was pretty good. She does have some chronic sleep problems. Appetite is reasonably okay. Had not had any new health problems. The last couple days she had several stresses. There was conflict with her long-standing boyfriend in that she found him with a another woman visiting. 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 She got to feeling acutely hopeless which is when she drank and took the pills. Patient denies any psychotic symptoms. She has a history of alcohol abuse and had been sober for about 9 months up until the time of this overdose yesterday.  Past  psychiatric history: Patient says she started having depression after her first child was born. She has not had psychiatric hospitalizations. She has been going to [facility] and sees a therapist and a psychiatrist. Currently taking Seroquel 50 mg twice a day, Xanax 2 mg twice a day Celexa 20 mg twice a day. She remembers Zoloft as being a past medicine that she took as well. Also taking Ambien 5 mg at night.  Substance abuse history: Past history of alcohol abuse. Said that she stopped drinking last year in the on him. Had been sober up until yesterday. Denies that she abuses any other drugs.  Family history: Positive for depression and alcohol abuse in several members.  Social history: Patient currently lives by herself with 2 children ages 18 and 60. Her relationship with their father seems somewhat unstable and on functional. She seems to have little other support or family members in the area. 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   HPI Elements:   Quality:  Suicidal ideation and behavior, depression. Severity:  Moderate. Severe transiently yesterday. Timing:  Bad over the last couple days after some stresses. Duration:  Long-standing depression with recent decompensation. Context:  Lost job, stress in relationship.  Past Medical History:  Past Medical History  Diagnosis Date  . Anxiety    History reviewed. No pertinent past surgical history. Family History: No family history on file. Social History:  History  Alcohol Use  . Yes    Comment: occasional     History  Drug Use No    History   Social History  . Marital Status: Single    Spouse Name: N/A  . Number of Children: N/A  . Years of Education: N/A   Social History Main Topics  . Smoking status: Current Some Day Smoker  . Smokeless tobacco: Never Used  . Alcohol Use: Yes     Comment: occasional  . Drug Use: No  . Sexual Activity: Not on file   Other Topics Concern  . None   Social  History Narrative   Additional Social History:                          Allergies:  No Known Allergies  Labs:  Results for orders placed or performed during the hospital encounter of 03/31/15 (from the past 48 hour(s))  CBC     Status: Abnormal   Collection Time: 03/31/15  3:27 PM  Result Value Ref Range   WBC 6.0 3.6 - 11.0 K/uL   RBC 4.08 3.80 - 5.20 MIL/uL   Hemoglobin 11.8 (L) 12.0 - 16.0 g/dL   HCT 36.1 35.0 - 47.0 %   MCV 88.5 80.0 - 100.0 fL   MCH 28.9 26.0 - 34.0 pg   MCHC 32.6 32.0 - 36.0 g/dL   RDW 14.6 (H) 11.5 - 14.5 %   Platelets 239 150 - 440 K/uL  Comprehensive metabolic panel     Status: Abnormal   Collection Time: 03/31/15  3:27 PM  Result Value Ref Range   Sodium 143 135 - 145 mmol/L   Potassium 3.4 (L) 3.5 - 5.1 mmol/L   Chloride 108 101 - 111 mmol/L   CO2 25 22 - 32 mmol/L   Glucose, Bld 96 65 - 99 mg/dL   BUN 6 6 - 20 mg/dL   Creatinine, Ser 0.68 0.44 - 1.00 mg/dL   Calcium 9.2 8.9 - 10.3 mg/dL   Total Protein 7.4 6.5 - 8.1 g/dL   Albumin 4.1 3.5 - 5.0 g/dL   AST 16 15 - 41 U/L   ALT 18 14 - 54 U/L   Alkaline Phosphatase 77 38 - 126 U/L   Total Bilirubin <0.1 (L) 0.3 - 1.2 mg/dL   GFR calc non Af Amer >60 >60 mL/min   GFR calc Af Amer >60 >60 mL/min    Comment: (NOTE) The eGFR has been calculated using the CKD EPI equation. This calculation has not been validated in all clinical situations. eGFR's persistently <60 mL/min signify possible Chronic Kidney Disease.    Anion gap 10 5 - 15  Ethanol (ETOH)     Status: Abnormal   Collection Time: 03/31/15  3:27 PM  Result Value Ref Range   Alcohol, Ethyl (B) 161 (H) <5 mg/dL    Comment:        LOWEST DETECTABLE LIMIT FOR SERUM ALCOHOL IS 11 mg/dL FOR MEDICAL PURPOSES ONLY   Acetaminophen level     Status: Abnormal   Collection Time: 03/31/15  3:27 PM  Result Value Ref Range   Acetaminophen (Tylenol), Serum <10 (L) 10 - 30 ug/mL    Comment:        THERAPEUTIC CONCENTRATIONS  VARY SIGNIFICANTLY. A RANGE OF 10-30 ug/mL MAY BE AN EFFECTIVE CONCENTRATION FOR MANY PATIENTS. HOWEVER, SOME ARE BEST TREATED AT CONCENTRATIONS OUTSIDE THIS RANGE. ACETAMINOPHEN CONCENTRATIONS >150 ug/mL AT 4 HOURS AFTER INGESTION AND >50 ug/mL AT 12 HOURS AFTER INGESTION ARE OFTEN ASSOCIATED WITH TOXIC REACTIONS.   Salicylate level  Status: None   Collection Time: 03/31/15  3:27 PM  Result Value Ref Range   Salicylate Lvl <2.6 2.8 - 30.0 mg/dL  Glucose, capillary     Status: None   Collection Time: 03/31/15  4:16 PM  Result Value Ref Range   Glucose-Capillary 84 65 - 99 mg/dL  Urine Drug Screen, Qualitative [facility])     Status: Abnormal   Collection Time: 04/01/15 12:22 AM  Result Value Ref Range   Tricyclic, Ur Screen POSITIVE (A) NONE DETECTED   Amphetamines, Ur Screen NONE DETECTED NONE DETECTED   MDMA (Ecstasy)Ur Screen NONE DETECTED NONE DETECTED   Cocaine Metabolite,Ur [place] NONE DETECTED NONE DETECTED   Opiate, Ur Screen POSITIVE (A) NONE DETECTED   Phencyclidine (PCP) Ur S NONE DETECTED NONE DETECTED   Cannabinoid 50 Ng, Ur [place] NONE DETECTED NONE DETECTED   Barbiturates, Ur Screen NONE DETECTED NONE DETECTED   Benzodiazepine, Ur Scrn POSITIVE (A) NONE DETECTED   Methadone Scn, Ur NONE DETECTED NONE DETECTED    Comment: (NOTE) 948  Tricyclics, urine               Cutoff 1000 ng/mL 200  Amphetamines, urine             Cutoff 1000 ng/mL 300  MDMA (Ecstasy), urine           Cutoff 500 ng/mL 400  Cocaine Metabolite, urine       Cutoff 300 ng/mL 500  Opiate, urine                   Cutoff 300 ng/mL 600  Phencyclidine (PCP), urine      Cutoff 25 ng/mL 700  Cannabinoid, urine              Cutoff 50 ng/mL 800  Barbiturates, urine             Cutoff 200 ng/mL 900  Benzodiazepine, urine           Cutoff 200 ng/mL 1000 Methadone, urine                Cutoff 300 ng/mL 1100 1200 The urine drug screen provides only a preliminary, unconfirmed 1300 analytical test result and  should not be used for non-medical 1400 purposes. Clinical consideration and professional judgment should 1500 be applied to any positive drug screen result due to possible 1600 interfering substances. A more specific alternate chemical method 1700 must be used in order to obtain a confirmed analytical result.  1800 Gas chromato graphy / mass spectrometry (GC/MS) is the preferred 1900 confirmatory method.   Pregnancy, urine (if pre-menopausal female)     Status: None   Collection Time: 04/01/15 12:22 AM  Result Value Ref Range   Preg Test, Ur NEGATIVE NEGATIVE    Vitals: Blood pressure 116/81, pulse 80, temperature 97.7 F (36.5 C), temperature source Oral, resp. rate 20, height 5' 3"  (1.6 m), weight 85.73 kg (189 lb), last menstrual period 03/21/2015, SpO2 100 %.  Risk to Self: Suicidal Ideation: Yes-Currently Present Suicidal Intent: Yes-Currently Present Is patient at risk for suicide?: Yes Suicidal Plan?: Yes-Currently Present Specify Current Suicidal Plan: today--overdose or seroquel(03-31-2015) Access to Means: Yes Specify Access to Suicidal Means: medication What has been your use of drugs/alcohol within the last 12 months?: yes How many times?: 0 Other Self Harm Risks: relationship; job Triggers for Past Attempts: None known Intentional Self Injurious Behavior: None Risk to Others: Homicidal Ideation: No Thoughts of Harm to Others: No Current Homicidal Intent:  No Current Homicidal Plan: No Access to Homicidal Means: No Identified Victim: none History of harm to others?: No Assessment of Violence: On admission Violent Behavior Description: none Does patient have access to weapons?: No Criminal Charges Pending?: No Does patient have a court date: No Prior Inpatient Therapy: Prior Inpatient Therapy: No Prior Therapy Dates: none Prior Therapy Facilty/Provider(s): none Reason for Treatment: unknown Prior Outpatient Therapy: Prior Outpatient Therapy: Yes Prior Therapy  Dates: quarterly Prior Therapy Facilty/Provider(s): [facility] Reason for Treatment: Depression Does patient have an ACCT team?: No Does patient have Intensive In-House Services?  : No Does patient have [facility] services? : No Does patient have P4CC services?: No  Current Facility-Administered Medications  Medication Dose Route Frequency Provider Last Rate Last Dose  . nicotine (NICOTROL) 10 MG inhaler 1 continuous puffing  1 continuous puffing Inhalation PRN Delman Kitten, MD       Current Outpatient Prescriptions  Medication Sig Dispense Refill  . alprazolam (XANAX) 2 MG tablet Take 2 mg by mouth 2 (two) times daily.    . citalopram (CELEXA) 20 MG tablet Take 20 mg by mouth daily.    . QUEtiapine (SEROQUEL) 50 MG tablet Take 150 mg by mouth at bedtime.     Marland Kitchen zolpidem (AMBIEN) 10 MG tablet Take 10 mg by mouth at bedtime.      Musculoskeletal: Strength & Muscle Tone: within normal limits Gait & Station: normal Patient leans: N/A  Psychiatric Specialty Exam: Physical Exam  Constitutional: She appears well-developed and well-nourished.  HENT:  Head: Normocephalic and atraumatic.  Eyes: Conjunctivae are normal. Pupils are equal, round, and reactive to light.  Neck: Normal range of motion.  Cardiovascular: Normal heart sounds.   Respiratory: Effort normal.  GI: Soft.  Musculoskeletal: Normal range of motion.  Neurological: She is alert.  Skin: Skin is warm and dry.  Psychiatric: Her speech is normal and behavior is normal. Judgment and thought content normal. Her mood appears anxious. Cognition and memory are normal.    Review of Systems  Constitutional: Negative.   HENT: Negative.   Eyes: Negative.   Respiratory: Negative.   Cardiovascular: Negative.   Gastrointestinal: Negative.   Musculoskeletal: Negative.   Skin: Negative.   Neurological: Negative.   Psychiatric/Behavioral: Positive for depression and substance abuse. Negative for suicidal ideas, hallucinations and  memory loss. The patient is nervous/anxious and has insomnia.     Blood pressure 116/81, pulse 80, temperature 97.7 F (36.5 C), temperature source Oral, resp. rate 20, height 5' 3"  (1.6 m), weight 85.73 kg (189 lb), last menstrual period 03/21/2015, SpO2 100 %.Body mass index is 33.49 kg/(m^2).  General Appearance: Fairly Groomed  Engineer, water::  Good  Speech:  Normal Rate  Volume:  Normal  Mood:  Anxious  Affect:  Constricted  Thought Process:  Goal Directed  Orientation:  Full (Time, Place, and Person)  Thought Content:  Negative  Suicidal Thoughts:  No  Homicidal Thoughts:  No  Memory:  Immediate;   Good Recent;   Good Remote;   Good  Judgement:  Intact  Insight:  Present  Psychomotor Activity:  Normal  Concentration:  Good  Recall:  Good  Fund of Knowledge:Good  Language: Good  Akathisia:  No  Handed:  Right  AIMS (if indicated):     Assets:  Communication Skills Desire for Improvement Financial Resources/Insurance Housing Physical Health  ADL's:  Intact  Cognition: WNL  Sleep:      Medical Decision Making: Review of Psycho-Social Stressors (1), Review or order  clinical lab tests (1), Established Problem, Worsening (2), Review of Last Therapy Session (1), Review or order medicine tests (1) and Review of Medication Regimen & Side Effects (2)  Treatment Plan Summary: Plan This is a 39 year old woman with a history of major depression that had been under apparent control until new social stresses occurred. She abused alcohol in a way that she had not done in many months and then took an overdose of her Seroquel. Almost immediately felt remorse and came to the hospital and was compliant with treatment. On evaluation today she is denying any suicidal ideation. Shows good insight. She is able to articulate a positive plan for the future. Fully agreeable to continuing follow-up with outpatient treatment. Understands the importance of continued sobriety. Lab tests and vital signs  stable no indication for further medical management. Patient can be taken off of involuntary commitment. No new prescriptions. She will be referred to continue [place] with her outpatient treatment. She is been educated about the risks of combining benzodiazepine Xanax alcohol together specifically. Case discussed with emergency room physician.  Plan:  No evidence of imminent risk to self or others at present.   Patient does not meet criteria for psychiatric inpatient admission. Supportive therapy provided about ongoing stressors. Discussed crisis plan, support from social network, calling 911, coming to the Emergency Department, and calling Suicide Hotline. Disposition: Patient may be discharged at the discretion of the emergency room physician  Alethia Berthold 04/01/2015 2:08 PM

## 2015-04-01 NOTE — ED Notes (Addendum)
1/1 bags of belongings returned to the pt   Discharge instructions reviewed with her and she verbalized agreement and understanding   [facility]

## 2015-04-01 NOTE — ED Notes (Signed)
Received phone call from registration - pt's mother is here and she is wanting keys from the pts belongings - I informed registration that we do not go back through pt belongings due to them being secured  -  Mother reports that the nurse from last night told her to come and get the keys

## 2015-04-01 NOTE — ED Notes (Signed)
Pt sleepy but easily aroused.  Walks to the bathroom independently without any difficulty.

## 2015-06-01 ENCOUNTER — Other Ambulatory Visit: Payer: Self-pay

## 2015-06-01 ENCOUNTER — Emergency Department: Payer: Medicaid Other

## 2015-06-01 ENCOUNTER — Emergency Department
Admission: EM | Admit: 2015-06-01 | Discharge: 2015-06-01 | Disposition: A | Discharge status: Home / Self Care | Payer: Medicaid Other | Attending: Emergency Medicine | Admitting: Emergency Medicine

## 2015-06-01 ENCOUNTER — Emergency Department
Admission: EM | Admit: 2015-06-01 | Discharge: 2015-06-01 | Disposition: A | Discharge status: Home / Self Care | Payer: Self-pay

## 2015-06-01 ENCOUNTER — Encounter: Payer: Self-pay | Admitting: Emergency Medicine

## 2015-06-01 DIAGNOSIS — M25511 Pain in right shoulder: Secondary | ICD-10-CM

## 2015-06-01 DIAGNOSIS — Z720 Tobacco use: Secondary | ICD-10-CM | POA: Insufficient documentation

## 2015-06-01 DIAGNOSIS — F419 Anxiety disorder, unspecified: Secondary | ICD-10-CM | POA: Diagnosis not present

## 2015-06-01 DIAGNOSIS — Z79899 Other long term (current) drug therapy: Secondary | ICD-10-CM | POA: Insufficient documentation

## 2015-06-01 HISTORY — DX: Depression, unspecified: F32.A

## 2015-06-01 HISTORY — DX: Major depressive disorder, single episode, unspecified: F32.9

## 2015-06-01 LAB — CBC WITH DIFFERENTIAL/PLATELET
Basophils Absolute: 0 10*3/uL (ref 0–0.1)
Basophils Relative: 1 %
Eosinophils Absolute: 0.3 10*3/uL (ref 0–0.7)
Eosinophils Relative: 5 %
HCT: 36.5 % (ref 35.0–47.0)
Hemoglobin: 12 g/dL (ref 12.0–16.0)
Lymphocytes Relative: 38 %
Lymphs Abs: 2 10*3/uL (ref 1.0–3.6)
MCH: 29.4 pg (ref 26.0–34.0)
MCHC: 32.9 g/dL (ref 32.0–36.0)
MCV: 89.4 fL (ref 80.0–100.0)
MONOS PCT: 10 %
Monocytes Absolute: 0.5 10*3/uL (ref 0.2–0.9)
Neutro Abs: 2.4 10*3/uL (ref 1.4–6.5)
Neutrophils Relative %: 46 %
Platelets: 243 10*3/uL (ref 150–440)
RBC: 4.09 MIL/uL (ref 3.80–5.20)
RDW: 15.2 % — AB (ref 11.5–14.5)
WBC: 5.2 10*3/uL (ref 3.6–11.0)

## 2015-06-01 LAB — COMPREHENSIVE METABOLIC PANEL
ALK PHOS: 61 U/L (ref 38–126)
ALT: 13 U/L — ABNORMAL LOW (ref 14–54)
AST: 14 U/L — ABNORMAL LOW (ref 15–41)
Albumin: 4 g/dL (ref 3.5–5.0)
Anion gap: 10 (ref 5–15)
BUN: <5 mg/dL — ABNORMAL LOW (ref 6–20)
CALCIUM: 8.8 mg/dL — AB (ref 8.9–10.3)
CO2: 26 mmol/L (ref 22–32)
CREATININE: 0.62 mg/dL (ref 0.44–1.00)
Chloride: 101 mmol/L (ref 101–111)
GFR calc Af Amer: >60 mL/min (ref >60)
GFR calc non Af Amer: >60 mL/min (ref >60)
Glucose, Bld: 95 mg/dL (ref 65–99)
Potassium: 3.6 mmol/L (ref 3.5–5.1)
Sodium: 137 mmol/L (ref 135–145)
TOTAL PROTEIN: 7.4 g/dL (ref 6.5–8.1)
Total Bilirubin: 0.4 mg/dL (ref 0.3–1.2)

## 2015-06-01 LAB — TROPONIN I: Troponin I: <0.03 ng/mL (ref <0.031)

## 2015-06-01 MED ORDER — NAPROXEN 500 MG PO TABS: 500.0000 mg | ORAL_TABLET | Freq: Two times a day (BID) | ORAL | Status: DC

## 2015-06-01 MED ORDER — OXYCODONE-ACETAMINOPHEN 5-325 MG PO TABS
ORAL_TABLET | ORAL | Status: AC
  Administered 2015-06-01: 13:00:00
  Filled 2015-06-01: qty 1

## 2015-06-01 MED ORDER — KETOROLAC TROMETHAMINE 30 MG/ML IJ SOLN: 30.0000 mg | Freq: Once | INTRAMUSCULAR | Status: DC

## 2015-06-01 MED ORDER — KETOROLAC TROMETHAMINE 60 MG/2ML IM SOLN
INTRAMUSCULAR | Status: AC
  Administered 2015-06-01: 60 mg via INTRAMUSCULAR
  Filled 2015-06-01: qty 2

## 2015-06-01 NOTE — ED Notes (Signed)
Waiting on labs.

## 2015-06-01 NOTE — ED Notes (Signed)
Patient transported to X-ray 

## 2015-06-01 NOTE — ED Notes (Signed)
Patient verbalized need for follow up with specialty provider. Patient verbalized understanding of all instructions given.

## 2015-06-01 NOTE — ED Notes (Addendum)
Patient presents to the ED with right arm and right jaw pain x 3 days.  Patient reports feeling a "heaviness" in her right arm.  Patient's grip strength is much weaker in her right arm. Patient does not have any drift in right arm when tested.  No weakness to right leg.  No facial droop.  Patient states pain is increased when she moves her right arm.  Patient also reports some increased pain when she breathes.  Patient denies any injury to arm.

## 2015-06-01 NOTE — ED Notes (Signed)
Past 3 days c/o pain right chest down her right arm, has new cough, # smokes, pain has slight increased pain with movement, denies fever

## 2015-06-01 NOTE — ED Provider Notes (Addendum)
[facility] Emergency Department Provider Note  ____________________________________________  Time seen: Approximately 11:25 AM  I have reviewed the triage vital signs and the nursing notes.   HISTORY  Chief Complaint Arm Pain   HPI Laurie Frederick is a 39 y.o. female patient reports 3 days of worsening pain in the right shoulder running from the right neck down into the right deltoid area and then stopping. She is the pain is dull and achy moderate to severe in nature. Constant. The worst pain is when she is sitting still. The pain is also increased by deep breathing and by moving the arm. Patient did not strain it. 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 Patient also has anxiety.   Past Medical History  Diagnosis Date  . Anxiety   . Depression     Patient Active Problem List   Diagnosis Date Noted  . 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 Alcohol abuse 04/01/2015    History reviewed. No pertinent past surgical history.  Current Outpatient Rx  Name  Route  Sig  Dispense  Refill  . alprazolam (XANAX) 2 MG tablet   Oral   Take 2 mg by mouth 2 (two) times daily.         . citalopram (CELEXA) 20 MG tablet   Oral   Take 20 mg by mouth daily.         . QUEtiapine (SEROQUEL) 50 MG tablet   Oral   Take 100 mg by mouth at bedtime.          Marland Kitchen zolpidem (AMBIEN) 10 MG tablet   Oral   Take 10 mg by mouth at bedtime.         . naproxen (NAPROSYN) 500 MG tablet   Oral   Take 1 tablet (500 mg total) by mouth 2 (two) times daily with a meal.   20 tablet   2     Allergies Review of patient's allergies indicates no known allergies.  No family history on file.  Social History History  Substance Use Topics  . Smoking status: Current Some Day Smoker -- 0.50 packs/day    Types: Cigarettes  . Smokeless tobacco: Never Used  . Alcohol Use: Yes     Comment: occasional    Review of Systems Constitutional:  No fever/chills Eyes: No visual changes. ENT: No sore throat. Cardiovascular: Denies chest pain. Respiratory: Denies shortness of breath. Gastrointestinal: No abdominal pain.  No nausea, no vomiting.  No diarrhea.  No constipation. Genitourinary: Negative for dysuria. Musculoskeletal: Negative for back pain. Skin: Negative for rash. Neurological: Negative for headaches, focal weakness or numbness.  10-point ROS otherwise negative.  ____________________________________________   PHYSICAL EXAM:  VITAL SIGNS: ED Triage Vitals  Enc Vitals Group     BP 06/01/15 0938 141/97 mmHg     Pulse Rate 06/01/15 0938 84     Resp 06/01/15 0938 16     Temp 06/01/15 0938 98.7 F (37.1 C)     Temp Source 06/01/15 0938 Oral     SpO2 06/01/15 0938 99 %     Weight 06/01/15 0938 189 lb (85.73 kg)     Height 06/01/15 0938  (1.575 m)     Head Cir --      Peak Flow --      Pain Score 06/01/15 0939 9     Pain Loc --      Pain Edu? --  Excl. in GC? --     Constitutional: Alert and oriented. Well appearing and in no acute distress. Eyes: Conjunctivae are normal. PERRL. EOMI. Head: Atraumatic. Nose: No congestion/rhinnorhea. Mouth/Throat: Mucous membranes are moist.  Oropharynx non-erythematous. Neck: No stridor.  Cardiovascular: Normal rate, regular rhythm. Grossly normal heart sounds.  Good peripheral circulation. Respiratory: Normal respiratory effort.  No retractions. Lungs CTAB. Gastrointestinal: Soft and nontender. No distention. No abdominal bruits. No CVA tenderness. Musculoskeletal: No lower extremity tenderness nor edema.  No joint effusions. Patient has no pain on palpation of the neck or shoulder or upper arm. Color there is good sensation is good. Patient complains of pain on any attempt to move the arm. Holds the arm in her lap without moving it. Pulses in the risks are wrists are equal and symmetrical bilaterally. Neurologic:  Normal speech and language. No gross focal  neurologic deficits are appreciated.  Skin:  Skin is warm, dry and intact. No rash noted. Psychiatric: Mood and affect are normal. Speech and behavior are normal.  ____________________________________________   LABS (all labs ordered are listed, but only abnormal results are displayed)  Labs Reviewed  COMPREHENSIVE METABOLIC PANEL - Abnormal; Notable for the following:    BUN <5 (*)    Calcium 8.8 (*)    AST 14 (*)    ALT 13 (*)    All other components within normal limits  CBC WITH DIFFERENTIAL/PLATELET - Abnormal; Notable for the following:    RDW 15.2 (*)    All other components within normal limits  TROPONIN I   ____________________________________________  EKG  EKG read and interpreted by me. Shows normal sinus rhythm at a rate of 82 normal axis no acute changes. ____________________________________________  RADIOLOGY  Chest x-ray and shoulder x-ray were normal ____________________________________________   PROCEDURES    ____________________________________________   INITIAL IMPRESSION / ASSESSMENT AND PLAN / ED COURSE  Pertinent labs & imaging results that were available during my care of the patient were reviewed by me and considered in my medical decision making (see chart for details).  Pain somewhat improved with narcotics the Toradol really didn't do much.  I am uncertain as to the etiology of the patient's pain I do not believe it is cardiac I will refer her for follow-up to orthopedics ____________________________________________   FINAL CLINICAL IMPRESSION(S) / ED DIAGNOSES  Final diagnoses:  Right shoulder pain      Arnaldo Natal, MD 06/01/15 1346 On discharge patient moving her arm well. She says now she has some what of a cough productive of small amounts of yellow sputum. There is no blood in the sputum. Patient's breathing easily and deeply without any apparent pain.  Arnaldo Natal, MD 06/01/15 1351

## 2015-07-04 DIAGNOSIS — F329 Major depressive disorder, single episode, unspecified: Secondary | ICD-10-CM | POA: Diagnosis present

## 2015-07-04 DIAGNOSIS — K760 Fatty (change of) liver, not elsewhere classified: Secondary | ICD-10-CM | POA: Diagnosis present

## 2015-07-04 DIAGNOSIS — E876 Hypokalemia: Secondary | ICD-10-CM | POA: Diagnosis present

## 2015-07-04 DIAGNOSIS — A084 Viral intestinal infection, unspecified: Principal | ICD-10-CM | POA: Diagnosis present

## 2015-07-04 DIAGNOSIS — F17210 Nicotine dependence, cigarettes, uncomplicated: Secondary | ICD-10-CM | POA: Diagnosis present

## 2015-07-04 DIAGNOSIS — F419 Anxiety disorder, unspecified: Secondary | ICD-10-CM | POA: Diagnosis present

## 2015-07-04 LAB — URINALYSIS COMPLETE WITH MICROSCOPIC (ARMC ONLY)
Bacteria, UA: NONE SEEN
Bilirubin Urine: NEGATIVE
Glucose, UA: NEGATIVE mg/dL
Nitrite: NEGATIVE
PH: 5 (ref 5.0–8.0)
PROTEIN: 30 mg/dL — AB
Specific Gravity, Urine: 1.026 (ref 1.005–1.030)

## 2015-07-04 LAB — CBC
HCT: 38.1 % (ref 35.0–47.0)
HEMOGLOBIN: 12.4 g/dL (ref 12.0–16.0)
MCH: 29.2 pg (ref 26.0–34.0)
MCHC: 32.6 g/dL (ref 32.0–36.0)
MCV: 89.7 fL (ref 80.0–100.0)
PLATELETS: 262 10*3/uL (ref 150–440)
RBC: 4.24 MIL/uL (ref 3.80–5.20)
RDW: 15.1 % — ABNORMAL HIGH (ref 11.5–14.5)
WBC: 10.5 10*3/uL (ref 3.6–11.0)

## 2015-07-04 MED ORDER — ONDANSETRON HCL 4 MG/2ML IJ SOLN
4.0000 mg | Freq: Once | INTRAMUSCULAR | Status: AC | PRN
  Administered 2015-07-04: 4 mg via INTRAVENOUS
  Filled 2015-07-04: qty 2

## 2015-07-04 NOTE — ED Notes (Signed)
Pt reports hx of gastritis once before, states that she ate spaghetti and salad for dinner and started vomiting with epigastric pain, pt reports approx 7 times, denies diarrhea

## 2015-07-05 ENCOUNTER — Inpatient Hospital Stay
Admission: EM | Admit: 2015-07-05 | Discharge: 2015-07-09 | DRG: 392 | Disposition: A | Discharge status: Home / Self Care | Payer: Medicaid Other | Attending: Internal Medicine | Admitting: Internal Medicine

## 2015-07-05 ENCOUNTER — Encounter: Payer: Self-pay | Admitting: Emergency Medicine

## 2015-07-05 ENCOUNTER — Emergency Department: Payer: Medicaid Other

## 2015-07-05 DIAGNOSIS — R1013 Epigastric pain: Secondary | ICD-10-CM

## 2015-07-05 DIAGNOSIS — K529 Noninfective gastroenteritis and colitis, unspecified: Secondary | ICD-10-CM | POA: Diagnosis present

## 2015-07-05 DIAGNOSIS — R109 Unspecified abdominal pain: Secondary | ICD-10-CM

## 2015-07-05 DIAGNOSIS — K2970 Gastritis, unspecified, without bleeding: Secondary | ICD-10-CM

## 2015-07-05 DIAGNOSIS — A084 Viral intestinal infection, unspecified: Secondary | ICD-10-CM

## 2015-07-05 LAB — COMPREHENSIVE METABOLIC PANEL
ALBUMIN: 4.4 g/dL (ref 3.5–5.0)
ALK PHOS: 67 U/L (ref 38–126)
ALT: 14 U/L (ref 14–54)
AST: 19 U/L (ref 15–41)
Anion gap: 9 (ref 5–15)
BUN: 10 mg/dL (ref 6–20)
CALCIUM: 9.5 mg/dL (ref 8.9–10.3)
CO2: 27 mmol/L (ref 22–32)
CREATININE: 0.81 mg/dL (ref 0.44–1.00)
Chloride: 104 mmol/L (ref 101–111)
GFR calc Af Amer: >60 mL/min (ref >60)
GFR calc non Af Amer: >60 mL/min (ref >60)
GLUCOSE: 103 mg/dL — AB (ref 65–99)
Potassium: 3.6 mmol/L (ref 3.5–5.1)
SODIUM: 140 mmol/L (ref 135–145)
Total Bilirubin: 0.9 mg/dL (ref 0.3–1.2)
Total Protein: 7.9 g/dL (ref 6.5–8.1)

## 2015-07-05 LAB — PREGNANCY, URINE: PREG TEST UR: NEGATIVE

## 2015-07-05 LAB — LIPASE, BLOOD: LIPASE: 17 U/L — AB (ref 22–51)

## 2015-07-05 LAB — TROPONIN I

## 2015-07-05 MED ORDER — PANTOPRAZOLE SODIUM 40 MG IV SOLR
INTRAVENOUS | Status: AC
  Administered 2015-07-05: 40 mg
  Filled 2015-07-05: qty 40

## 2015-07-05 MED ORDER — MORPHINE SULFATE (PF) 4 MG/ML IV SOLN
4.0000 mg | Freq: Once | INTRAVENOUS | Status: AC
  Administered 2015-07-05: 4 mg via INTRAVENOUS
  Filled 2015-07-05: qty 1

## 2015-07-05 MED ORDER — IOHEXOL 240 MG/ML SOLN
25.0000 mL | Freq: Once | INTRAMUSCULAR | Status: DC | PRN
Start: 2015-07-05 — End: 2015-07-09
  Administered 2015-07-05: 25 mL via ORAL
  Filled 2015-07-05: qty 50

## 2015-07-05 MED ORDER — IOHEXOL 240 MG/ML SOLN: 25.0000 mL | INTRAMUSCULAR | Status: DC

## 2015-07-05 MED ORDER — ONDANSETRON HCL 4 MG/2ML IJ SOLN
4.0000 mg | Freq: Four times a day (QID) | INTRAMUSCULAR | Status: DC | PRN
  Administered 2015-07-05 – 2015-07-06 (×3): 4 mg via INTRAVENOUS
  Filled 2015-07-05 (×4): qty 2

## 2015-07-05 MED ORDER — DIAZEPAM 5 MG/ML IJ SOLN
1.0000 mg | Freq: Once | INTRAMUSCULAR | Status: AC
  Administered 2015-07-05: 1 mg via INTRAVENOUS
  Filled 2015-07-05: qty 2

## 2015-07-05 MED ORDER — RANITIDINE HCL 150 MG PO TABS: 150.0000 mg | ORAL_TABLET | Freq: Two times a day (BID) | ORAL | Status: DC

## 2015-07-05 MED ORDER — PROMETHAZINE HCL 25 MG/ML IJ SOLN
INTRAMUSCULAR | Status: AC
  Administered 2015-07-05: 12.5 mg
  Filled 2015-07-05: qty 1

## 2015-07-05 MED ORDER — ALPRAZOLAM 0.5 MG PO TABS
1.0000 mg | ORAL_TABLET | Freq: Two times a day (BID) | ORAL | Status: DC
  Administered 2015-07-05 – 2015-07-09 (×8): 1 mg via ORAL
  Filled 2015-07-05 (×9): qty 2

## 2015-07-05 MED ORDER — ZOLPIDEM TARTRATE 5 MG PO TABS
5.0000 mg | ORAL_TABLET | Freq: Every evening | ORAL | Status: DC | PRN
  Administered 2015-07-05 – 2015-07-08 (×4): 5 mg via ORAL
  Filled 2015-07-05 (×4): qty 1

## 2015-07-05 MED ORDER — MORPHINE SULFATE (PF) 2 MG/ML IV SOLN
2.0000 mg | INTRAVENOUS | Status: DC | PRN
  Administered 2015-07-05 – 2015-07-06 (×9): 2 mg via INTRAVENOUS
  Filled 2015-07-05 (×9): qty 1

## 2015-07-05 MED ORDER — SODIUM CHLORIDE 0.9 % IV BOLUS (SEPSIS)
1000.0000 mL | Freq: Once | INTRAVENOUS | Status: AC
  Administered 2015-07-05: 1000 mL via INTRAVENOUS

## 2015-07-05 MED ORDER — IOHEXOL 300 MG/ML  SOLN
100.0000 mL | Freq: Once | INTRAMUSCULAR | Status: AC | PRN
  Administered 2015-07-05: 100 mL via INTRAVENOUS

## 2015-07-05 MED ORDER — GI COCKTAIL ~~LOC~~
ORAL | Status: AC
  Administered 2015-07-05: 30 mL via ORAL
  Filled 2015-07-05: qty 30

## 2015-07-05 MED ORDER — PANTOPRAZOLE SODIUM 40 MG IV SOLR
40.0000 mg | Freq: Two times a day (BID) | INTRAVENOUS | Status: DC
  Administered 2015-07-05 – 2015-07-08 (×7): 40 mg via INTRAVENOUS
  Filled 2015-07-05 (×7): qty 40

## 2015-07-05 MED ORDER — METOCLOPRAMIDE HCL 10 MG PO TABS: 10.0000 mg | ORAL_TABLET | Freq: Three times a day (TID) | ORAL | Status: DC

## 2015-07-05 MED ORDER — METOCLOPRAMIDE HCL 5 MG/ML IJ SOLN
5.0000 mg | Freq: Once | INTRAMUSCULAR | Status: AC
  Administered 2015-07-05: 5 mg via INTRAVENOUS
  Filled 2015-07-05: qty 2

## 2015-07-05 MED ORDER — SODIUM CHLORIDE 0.9 % IV SOLN
INTRAVENOUS | Status: AC
  Administered 2015-07-05 – 2015-07-06 (×3): via INTRAVENOUS

## 2015-07-05 MED ORDER — GI COCKTAIL ~~LOC~~
30.0000 mL | Freq: Once | ORAL | Status: AC
  Administered 2015-07-05: 30 mL via ORAL

## 2015-07-05 NOTE — ED Notes (Signed)
Pt sleeping when i entered room. No apparent distress

## 2015-07-05 NOTE — ED Provider Notes (Signed)
Limits reassess the patient after Valium and by mouth challenge. Physical Exam  BP 146/85 mmHg  Pulse 60  Temp(Src) 99.1 F (37.3 C) (Oral)  Resp 18  Ht  (1.575 m)  Wt 181 lb (82.101 kg)  BMI 33.10 kg/m2  SpO2 100%  LMP 07/04/2015  ----------------------------------------- 9:18 AM on 07/05/2015 -----------------------------------------  The patient has vomited her by mouth contrast. She has since received Valium. I gave her a cracker packet as well as ginger ale as requested by the patient. Since trying the by mouth challenge she now says that she feels this is she will vomit again. She is having increased pain to her upper abdomen she is requesting to be admitted to the hospital.  Physical Exam Patient initially calm the room but when I examine her she has moderate to severe tenderness in epigastrium. ED Course  Procedures  MDM Patient with symptoms refractory to multiple medications. We'll admit for intractable abdominal pain with nausea and vomiting. Signed out to Dr. Seth Bake of the medicine service.      Myrna Blazer, MD 07/05/15 [phone]

## 2015-07-05 NOTE — H&P (Signed)
[facility] -  at [facility]   PATIENT NAME: Laurie Frederick    MR#:  [id number]  DATE OF BIRTH:  23-Mar-1976  DATE OF ADMISSION:  07/05/2015  PRIMARY CARE PHYSICIAN: Baruch Gouty, MD   REQUESTING/REFERRING PHYSICIAN: [email or web address]  CHIEF COMPLAINT:   Chief Complaint  Patient presents with  . Abdominal Pain    HISTORY OF PRESENT ILLNESS: Laurie Frederick  is a 39 y.o. female with a known history of depression-has a 24-year-old son who had vomiting fever and cough for 2 days- but he is recovering now. Since yesterday evening after eating home made spaghetti, which was freshly prepared- patient started having severe vomiting in the evening and continued throughout the night so came to emergency room. She denies eating stored food or eating outside in last 2-3 days. She also had some mild fever and epigastric pain because of continuous vomiting. She is in ER since last night stayed overnight in ER tried multiple times pain medications and nausea medicines but he did not help to control the symptoms so finally she is given his admission to hospitalist team.   PAST MEDICAL HISTORY:   Past Medical History  Diagnosis Date  . Anxiety   . Depression     PAST SURGICAL HISTORY:  Past Surgical History  Procedure Laterality Date  . Cesarean section      SOCIAL HISTORY:  Social History  Substance Use Topics  . Smoking status: Current Some Day Smoker -- 0.50 packs/day    Types: Cigarettes  . Smokeless tobacco: Never Used  . Alcohol Use: Yes     Comment: occasional    FAMILY HISTORY:  Family History  Problem Relation Age of Onset  . Lung cancer Father   . Lung cancer Paternal Aunt   . Bone cancer Paternal Uncle     DRUG ALLERGIES: No Known Allergies  REVIEW OF SYSTEMS:   CONSTITUTIONAL: No fever, fatigue or weakness.  EYES: No blurred or double vision.  EARS, NOSE, AND THROAT: No tinnitus or ear pain.  RESPIRATORY: No cough, shortness of breath,  wheezing or hemoptysis.  CARDIOVASCULAR: No chest pain, orthopnea, edema.  GASTROINTESTINAL: positive for nausea, vomiting, no diarrhea, positive abdominal pain.  GENITOURINARY: No dysuria, hematuria.  ENDOCRINE: No polyuria, nocturia,  HEMATOLOGY: No anemia, easy bruising or bleeding SKIN: No rash or lesion. MUSCULOSKELETAL: No joint pain or arthritis.   NEUROLOGIC: No tingling, numbness, weakness.  PSYCHIATRY: No anxiety or depression.   MEDICATIONS AT HOME:  Prior to Admission medications   Medication Sig Start Date End Date Taking? Authorizing Provider  alprazolam Prudy Feeler) 2 MG tablet Take 2 mg by mouth 2 (two) times daily.    Historical Provider, MD  metoCLOPramide (REGLAN) 10 MG tablet Take 1 tablet (10 mg total) by mouth 3 (three) times daily with meals. 07/05/15 07/04/16  Irean Hong, MD  naproxen (NAPROSYN) 500 MG tablet Take 1 tablet (500 mg total) by mouth 2 (two) times daily with a meal. 06/01/15 05/31/16  Arnaldo Natal, MD  ranitidine (ZANTAC) 150 MG tablet Take 1 tablet (150 mg total) by mouth 2 (two) times daily. 07/05/15   Irean Hong, MD  zolpidem (AMBIEN) 10 MG tablet Take 10 mg by mouth at bedtime.    Historical Provider, MD      PHYSICAL EXAMINATION:   VITAL SIGNS: Blood pressure 146/85, pulse 60, temperature 99.1 F (37.3 C), temperature source Oral, resp. rate 18, height  (1.575 m), weight 82.101 kg (181 lb), last menstrual period  07/04/2015, SpO2 100 %.  GENERAL:  39 y.o.-year-old patient lying in the bed with no acute distress.  EYES: Pupils equal, round, reactive to light and accommodation. No scleral icterus. Extraocular muscles intact.  HEENT: Head atraumatic, normocephalic. Oropharynx and nasopharynx clear.  NECK:  Supple, no jugular venous distention. No thyroid enlargement, no tenderness.  LUNGS: Normal breath sounds bilaterally, no wheezing, rales,rhonchi or crepitation. No use of accessory muscles of respiration.  CARDIOVASCULAR: S1, S2 normal. No murmurs,  rubs, or gallops.  ABDOMEN: Soft, mild tender, nondistended. Bowel sounds present. No organomegaly or mass.  EXTREMITIES: No pedal edema, cyanosis, or clubbing.  NEUROLOGIC: Cranial nerves II through XII are intact. Muscle strength 5/5 in all extremities. Sensation intact. Gait not checked.  PSYCHIATRIC: The patient is alert and oriented x 3.  SKIN: No obvious rash, lesion, or ulcer.   LABORATORY PANEL:   CBC  Recent Labs Lab 07/04/15 2324  WBC 10.5  HGB 12.4  HCT 38.1  PLT 262  MCV 89.7  MCH 29.2  MCHC 32.6  RDW 15.1*   ------------------------------------------------------------------------------------------------------------------  Chemistries   Recent Labs Lab 07/04/15 2324  NA 140  K 3.6  CL 104  CO2 27  GLUCOSE 103*  BUN 10  CREATININE 0.81  CALCIUM 9.5  AST 19  ALT 14  ALKPHOS 67  BILITOT 0.9   ------------------------------------------------------------------------------------------------------------------ estimated creatinine clearance is 94.4 mL/min (by C-G formula based on Cr of 0.81). ------------------------------------------------------------------------------------------------------------------ No results for input(s): TSH, T4TOTAL, T3FREE, THYROIDAB in the last 72 hours.  Invalid input(s): FREET3   Coagulation profile No results for input(s): INR, PROTIME in the last 168 hours. ------------------------------------------------------------------------------------------------------------------- No results for input(s): DDIMER in the last 72 hours. -------------------------------------------------------------------------------------------------------------------  Cardiac Enzymes  Recent Labs Lab 07/04/15 2324  TROPONINI <0.03   ------------------------------------------------------------------------------------------------------------------ Invalid input(s):  POCBNP  ---------------------------------------------------------------------------------------------------------------  Urinalysis    Component Value Date/Time   COLORURINE YELLOW* 07/04/2015 2324   COLORURINE Yellow 11/03/2014 0936   APPEARANCEUR CLEAR* 07/04/2015 2324   APPEARANCEUR Hazy 11/03/2014 0936   LABSPEC 1.026 07/04/2015 2324   LABSPEC 1.026 11/03/2014 0936   PHURINE 5.0 07/04/2015 2324   PHURINE 5.0 11/03/2014 0936   GLUCOSEU NEGATIVE 07/04/2015 2324   GLUCOSEU Negative 11/03/2014 0936   HGBUR 3+* 07/04/2015 2324   HGBUR 3+ 11/03/2014 0936   BILIRUBINUR NEGATIVE 07/04/2015 2324   BILIRUBINUR Negative 11/03/2014 0936   KETONESUR TRACE* 07/04/2015 2324   KETONESUR Negative 11/03/2014 0936   PROTEINUR 30* 07/04/2015 2324   PROTEINUR Negative 11/03/2014 0936   NITRITE NEGATIVE 07/04/2015 2324   NITRITE Negative 11/03/2014 0936   LEUKOCYTESUR TRACE* 07/04/2015 2324   LEUKOCYTESUR Trace 11/03/2014 0936     RADIOLOGY: Ct Abdomen Pelvis W Contrast  07/05/2015   CLINICAL DATA:  Mid abdominal pain, nausea and vomiting since eating spaghetti and salad for dinner.  EXAM: CT ABDOMEN AND PELVIS WITH CONTRAST  TECHNIQUE: Multidetector CT imaging of the abdomen and pelvis was performed using the standard protocol following bolus administration of intravenous contrast.  CONTRAST:  OMNIPAQUE IOHEXOL 300 MG/ML  SOLN  COMPARISON:  MRI abdomen 02/04/2014  FINDINGS: Lung bases are clear.  Focal fatty infiltration adjacent to the falciform ligament of the liver. Gallbladder, spleen, pancreas, adrenal glands, kidneys, abdominal aorta, inferior vena cava, and retroperitoneal lymph nodes are unremarkable. Stomach, small bowel, and colon are not abnormally distended. No discrete wall thickening is appreciated although evaluation of the bowel wall is limited due to under distention. No free air or free fluid in the abdomen. Minimal  umbilical hernia containing fat.  Pelvis: Uterus is  anteverted. Intrauterine device is present. Heterogeneous nodular enhancement of the uterus consistent with fibroids. No abnormal adnexal masses. No free or loculated pelvic fluid collections. No pelvic mass or lymphadenopathy. Bladder wall is not thickened. Appendix is normal. No destructive bone lesions.  IMPRESSION: No focal acute process demonstrated in the abdomen or pelvis. No evidence of bowel obstruction or inflammation. Minimal umbilical hernia containing fat. Focal fatty infiltration in the liver.   Electronically Signed   By: Burman Nieves M.D.   On: 07/05/2015 06:51    IMPRESSION AND PLAN:  * Acute viral gastroenteritis Severe intractable vomiting, CT scan of abdomen is negative, no rise in white cell count.  We will give symptomatic treatment for now, for nausea and pain.  I will give IV fluids to keep her hydrated.  * Epigastric pain  This might be secondary to cost and vomiting, might be some mucosal damage.  She didn't notice any blood in the vomitus yet.  I will give Protonix IV.  * Depression and anxiety  Continue home medications  * Smoking  Counseled to quit smoking for 4 minutes and offered a nicotine patch.  All the records are reviewed and case discussed with ED provider. Management plans discussed with the patient, family and they are in agreement.  CODE STATUS: Full  TOTAL TIME TAKING CARE OF THIS PATIENT: 50 minutes.   Altamese Dilling M.D on 07/05/2015   Between 7am to 6pm - Pager - [phone]  After 6pm go to [email or web address] - password [email or web address] [facility]  [facility]  Office  [phone]  CC: Primary care physician; Baruch Gouty, MD

## 2015-07-05 NOTE — ED Provider Notes (Signed)
[facility] Emergency Department Provider Note  ____________________________________________  Time seen: Approximately 4:59 AM  I have reviewed the triage vital signs and the nursing notes.   HISTORY  Chief Complaint Abdominal Pain    HPI Laurie Frederick is a 39 y.o. female who presents to the ED from home with a chief complaint of epigastric pain, nausea and vomiting. Patient reports a history of gastritis with similar presentation previously. States she ate spaghetti and salad for dinner and subsequently began to experience epigastric burning associated with vomiting. Reports 7 episodes of emesis prior to arrival. Denies fever, chills, chest pain, shortness of breath, dysuria, diarrhea.   Past Medical History  Diagnosis Date  . Anxiety   . Depression     Patient Active Problem List   Diagnosis Date Noted  . 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 Alcohol abuse 04/01/2015    Past surgical history None for cholecystectomy  Current Outpatient Rx  Name  Route  Sig  Dispense  Refill  . alprazolam (XANAX) 2 MG tablet   Oral   Take 2 mg by mouth 2 (two) times daily.         . naproxen (NAPROSYN) 500 MG tablet   Oral   Take 1 tablet (500 mg total) by mouth 2 (two) times daily with a meal.   20 tablet   2   . zolpidem (AMBIEN) 10 MG tablet   Oral   Take 10 mg by mouth at bedtime.           Allergies Review of patient's allergies indicates no known allergies.  No family history on file.  Social History Social History  Substance Use Topics  . Smoking status: Current Some Day Smoker -- 0.50 packs/day    Types: Cigarettes  . Smokeless tobacco: Never Used  . Alcohol Use: Yes     Comment: occasional    Review of Systems Constitutional: No fever/chills Eyes: No visual changes. ENT: No sore throat. Cardiovascular: Denies chest pain. Respiratory: Denies shortness of breath. Gastrointestinal: Positive for abdominal pain.   Positive for nausea and vomiting.  No diarrhea.  No constipation. Genitourinary: Negative for dysuria. Musculoskeletal: Negative for back pain. Skin: Negative for rash. Neurological: Negative for headaches, focal weakness or numbness.  10-point ROS otherwise negative.  ____________________________________________   PHYSICAL EXAM:  VITAL SIGNS: ED Triage Vitals  Enc Vitals Group     BP 07/04/15 2307 128/77 mmHg     Pulse Rate 07/04/15 2307 89     Resp 07/04/15 2307 18     Temp 07/04/15 2307 99.1 F (37.3 C)     Temp Source 07/04/15 2307 Oral     SpO2 07/04/15 2307 100 %     Weight 07/04/15 2307 181 lb (82.101 kg)     Height 07/04/15 2307  (1.575 m)     Head Cir --      Peak Flow --      Pain Score 07/04/15 2321 10     Pain Loc --      Pain Edu? --      Excl. in GC? --     Constitutional: Alert and oriented. Tearful and in moderate acute distress. Eyes: Conjunctivae are normal. PERRL. EOMI. Head: Atraumatic. Nose: No congestion/rhinnorhea. Mouth/Throat: Mucous membranes are moist.  Oropharynx non-erythematous. Neck: No stridor.   Cardiovascular: Normal rate, regular rhythm. Grossly normal heart sounds.  Good peripheral circulation. Respiratory: Normal respiratory effort.  No retractions. Lungs CTAB. Gastrointestinal: Soft and nontender. No distention.  No abdominal bruits. No CVA tenderness. Musculoskeletal: No lower extremity tenderness nor edema.  No joint effusions. Neurologic:  Normal speech and language. No gross focal neurologic deficits are appreciated. No gait instability. Skin:  Skin is warm, dry and intact. No rash noted. Psychiatric: Mood and affect are normal. Speech and behavior are normal.  ____________________________________________   LABS (all labs ordered are listed, but only abnormal results are displayed)  Labs Reviewed  LIPASE, BLOOD - Abnormal; Notable for the following:    Lipase 17 (*)    All other components within normal limits   COMPREHENSIVE METABOLIC PANEL - Abnormal; Notable for the following:    Glucose, Bld 103 (*)    All other components within normal limits  CBC - Abnormal; Notable for the following:    RDW 15.1 (*)    All other components within normal limits  URINALYSIS COMPLETEWITH MICROSCOPIC ([facility] ONLY) - Abnormal; Notable for the following:    Color, Urine YELLOW (*)    APPearance CLEAR (*)    Ketones, ur TRACE (*)    Hgb urine dipstick 3+ (*)    Protein, ur 30 (*)    Leukocytes, UA TRACE (*)    Squamous Epithelial / LPF 0-5 (*)    All other components within normal limits  TROPONIN I   ____________________________________________  EKG  ED ECG REPORT I, Roshan Roback J, the attending physician, personally viewed and interpreted this ECG.   Date: 07/05/2015  EKG Time: 0210  Rate: 82  Rhythm: normal EKG, normal sinus rhythm  Axis: Normal  Intervals:none  ST&T Change: Nonspecific  ____________________________________________  RADIOLOGY  CT abdomen and pelvis interpreted per Dr. Andria Meuse:  No focal acute process demonstrated in the abdomen or pelvis. No evidence of bowel obstruction or inflammation. Minimal umbilical hernia containing fat. Focal fatty infiltration in the liver. ____________________________________________   PROCEDURES  Procedure(s) performed: None  Critical Care performed: No  ____________________________________________   INITIAL IMPRESSION / ASSESSMENT AND PLAN / ED COURSE  Pertinent labs & imaging results that were available during my care of the patient were reviewed by me and considered in my medical decision making (see chart for details).  39 year old female with epigastric pain, nausea and vomiting after eating spaghetti and salad. Patient received IV Zofran, Protonix, Phenergan and GI cocktail prior to her arrival in the room. She is currently actively dry heaving. Will continue IV fluid resuscitation, try Reglan and obtain CT abdomen/pelvis to  evaluate infectious/inflammatory etiology.  ----------------------------------------- 7:15 AM on 07/05/2015 -----------------------------------------  Patient soundly asleep. Awakened to reassess. States nausea is improved; however, still complains of epigastric discomfort. Will give small dose IV Valium for muscle relaxation. Plan for PO challenge afterwards. Anticipate discharge home if patient is able to tolerate PO. Care transferred to Dr. Langston Masker. ____________________________________________   FINAL CLINICAL IMPRESSION(S) / ED DIAGNOSES  Final diagnoses:  Non-intractable vomiting with nausea, vomiting of unspecified type  Gastritis      Irean Hong, MD 07/05/15 0800

## 2015-07-05 NOTE — ED Notes (Signed)
Pt uprite on stretcher in exam room, naked, moaning, restless; pt placed in hosp gown; pt c/o mid abd pain with N/V since eating spaghetti and salad for dinner; pt st "just tell the doctor to put me to sleep, I can't stand this pain!"; +BS, abd soft/nondist, c/o generalized discomfort with palpation of abdomen; denies any bowel or urinary c/o; st hx gastritis

## 2015-07-05 NOTE — Progress Notes (Signed)
Pt admitted to room 142, c/o nausea no vomitting, no diarrhea, oriented to room and fall policy and to call nurse for ambulating to bathroom , pt is NPO , pt medicated for nausea

## 2015-07-06 DIAGNOSIS — E876 Hypokalemia: Secondary | ICD-10-CM | POA: Diagnosis present

## 2015-07-06 DIAGNOSIS — A084 Viral intestinal infection, unspecified: Secondary | ICD-10-CM | POA: Diagnosis present

## 2015-07-06 DIAGNOSIS — F17210 Nicotine dependence, cigarettes, uncomplicated: Secondary | ICD-10-CM | POA: Diagnosis present

## 2015-07-06 DIAGNOSIS — K529 Noninfective gastroenteritis and colitis, unspecified: Secondary | ICD-10-CM | POA: Diagnosis present

## 2015-07-06 DIAGNOSIS — F329 Major depressive disorder, single episode, unspecified: Secondary | ICD-10-CM | POA: Diagnosis present

## 2015-07-06 DIAGNOSIS — F419 Anxiety disorder, unspecified: Secondary | ICD-10-CM | POA: Diagnosis present

## 2015-07-06 DIAGNOSIS — K2970 Gastritis, unspecified, without bleeding: Secondary | ICD-10-CM | POA: Diagnosis present

## 2015-07-06 DIAGNOSIS — K760 Fatty (change of) liver, not elsewhere classified: Secondary | ICD-10-CM | POA: Diagnosis present

## 2015-07-06 LAB — CBC
HCT: 34.4 % — ABNORMAL LOW (ref 35.0–47.0)
HEMOGLOBIN: 11.4 g/dL — AB (ref 12.0–16.0)
MCH: 29.6 pg (ref 26.0–34.0)
MCHC: 33.1 g/dL (ref 32.0–36.0)
MCV: 89.4 fL (ref 80.0–100.0)
PLATELETS: 212 10*3/uL (ref 150–440)
RBC: 3.85 MIL/uL (ref 3.80–5.20)
RDW: 15.3 % — ABNORMAL HIGH (ref 11.5–14.5)
WBC: 7.9 10*3/uL (ref 3.6–11.0)

## 2015-07-06 LAB — BASIC METABOLIC PANEL
ANION GAP: 6 (ref 5–15)
BUN: 10 mg/dL (ref 6–20)
CALCIUM: 9 mg/dL (ref 8.9–10.3)
CO2: 26 mmol/L (ref 22–32)
CREATININE: 0.84 mg/dL (ref 0.44–1.00)
Chloride: 110 mmol/L (ref 101–111)
GLUCOSE: 83 mg/dL (ref 65–99)
Potassium: 3.4 mmol/L — ABNORMAL LOW (ref 3.5–5.1)
Sodium: 142 mmol/L (ref 135–145)

## 2015-07-06 MED ORDER — SCOPOLAMINE 1 MG/3DAYS TD PT72
1.0000 | MEDICATED_PATCH | TRANSDERMAL | Status: DC
  Administered 2015-07-06 – 2015-07-09 (×2): 1.5 mg via TRANSDERMAL
  Filled 2015-07-06 (×2): qty 1

## 2015-07-06 MED ORDER — DIPHENHYDRAMINE HCL 50 MG/ML IJ SOLN
12.5000 mg | Freq: Once | INTRAMUSCULAR | Status: AC
  Administered 2015-07-06: 12.5 mg via INTRAVENOUS
  Filled 2015-07-06: qty 1

## 2015-07-06 MED ORDER — NICOTINE 7 MG/24HR TD PT24
7.0000 mg | MEDICATED_PATCH | Freq: Every day | TRANSDERMAL | Status: DC
  Administered 2015-07-06 – 2015-07-08 (×3): 7 mg via TRANSDERMAL
  Filled 2015-07-06 (×3): qty 1

## 2015-07-06 MED ORDER — POTASSIUM CHLORIDE CRYS ER 20 MEQ PO TBCR
40.0000 meq | EXTENDED_RELEASE_TABLET | Freq: Once | ORAL | Status: AC
  Administered 2015-07-06: 40 meq via ORAL
  Filled 2015-07-06: qty 2

## 2015-07-06 MED ORDER — HYDROMORPHONE HCL 1 MG/ML IJ SOLN
1.0000 mg | INTRAMUSCULAR | Status: DC | PRN
  Administered 2015-07-06 – 2015-07-09 (×13): 1 mg via INTRAVENOUS
  Filled 2015-07-06 (×13): qty 1

## 2015-07-06 NOTE — Progress Notes (Signed)
Dr. Anne Hahn notified that pts. Pain was getting no better. No new orders.

## 2015-07-06 NOTE — Progress Notes (Signed)
[facility] - [place] at [facility]   PATIENT NAME: Laurie Frederick    MR#:  [id number]  DATE OF BIRTH:  1975-11-25  SUBJECTIVE:  CHIEF COMPLAINT:   Chief Complaint  Patient presents with  . Abdominal Pain  having severe abdominal pain and nausea but no vomiting, requesting more pain medicine REVIEW OF SYSTEMS:  Review of Systems  Constitutional: Negative for fever, weight loss, malaise/fatigue and diaphoresis.  HENT: Negative for ear discharge, ear pain, hearing loss, nosebleeds, sore throat and tinnitus.   Eyes: Negative for blurred vision and pain.  Respiratory: Negative for cough, hemoptysis, shortness of breath and wheezing.   Cardiovascular: Negative for chest pain, palpitations, orthopnea and leg swelling.  Gastrointestinal: Positive for nausea, vomiting and abdominal pain. Negative for heartburn, diarrhea, constipation and blood in stool.  Genitourinary: Negative for dysuria, urgency and frequency.  Musculoskeletal: Negative for myalgias and back pain.  Skin: Negative for itching and rash.  Neurological: Negative for dizziness, tingling, tremors, focal weakness, seizures, weakness and headaches.  Psychiatric/Behavioral: Positive for depression. The patient is nervous/anxious.    DRUG ALLERGIES:  No Known Allergies VITALS:  Blood pressure 167/78, pulse 64, temperature 98.5 F (36.9 C), temperature source Oral, resp. rate 17, height 5\' 2"  (1.575 m), weight 82.101 kg (181 lb), last menstrual period 07/04/2015, SpO2 100 %. PHYSICAL EXAMINATION:  Physical Exam  Constitutional: She is oriented to person, place, and time and well-developed, well-nourished, and in no distress.  HENT:  Head: Normocephalic and atraumatic.  Eyes: Conjunctivae and EOM are normal. Pupils are equal, round, and reactive to light.  Neck: Normal range of motion. Neck supple. No tracheal deviation present. No thyromegaly present.  Cardiovascular: Normal rate, regular rhythm and  normal heart sounds.   Pulmonary/Chest: Effort normal and breath sounds normal. No respiratory distress. She has no wheezes. She exhibits no tenderness.  Abdominal: Soft. Bowel sounds are normal. She exhibits no distension. There is tenderness in the epigastric area.  Musculoskeletal: Normal range of motion.  Neurological: She is alert and oriented to person, place, and time. No cranial nerve deficit.  Skin: Skin is warm and dry. No rash noted.  Psychiatric: Mood and affect normal.   LABORATORY PANEL:   CBC  Recent Labs Lab 07/06/15 0655  WBC 7.9  HGB 11.4*  HCT 34.4*  PLT 212   ------------------------------------------------------------------------------------------------------------------ Chemistries   Recent Labs Lab 07/04/15 2324 07/06/15 0523  NA 140 142  K 3.6 3.4*  CL 104 110  CO2 27 26  GLUCOSE 103* 83  BUN 10 10  CREATININE 0.81 0.84  CALCIUM 9.5 9.0  AST 19  --   ALT 14  --   ALKPHOS 67  --   BILITOT 0.9  --    RADIOLOGY:  No results found. ASSESSMENT AND PLAN:   * Severe abdominal pain, nausea, vomiting of unknown etiology Severe intractable vomiting, CT scan of abdomen is negative, no rise in white cell count. symptomatic treatment for now, for nausea and pain.  continue IV fluids to keep her hydrated. Consults GI for Further evaluation  * Hypokalemia Replete and recheck  * Epigastric pain This might be secondary to nausea and vomiting - Gastritis, might be some mucosal damage. She didn't notice any blood in the vomitus yet. continue Protonix IV.  * Depression and anxiety Continue home medications - c/s psych  * Smoking Counseled to quit smoking for 4 minutes and offered a nicotine patch.    All the records are reviewed and case discussed  with Care Management/Social Worker. Management plans discussed with the patient, family and they are in agreement.  CODE STATUS: Full code  TOTAL TIME TAKING CARE OF THIS PATIENT: 35 minutes.    More than 50% of the time was spent in counseling/coordination of care: YES  POSSIBLE D/C IN 2-3 DAYS, DEPENDING ON CLINICAL CONDITION.  And GI evaluation   [facility], Husain Costabile M.D on 07/06/2015 at 8:13 AM  Between 7am to 6pm - Pager - [phone]  After 6pm go to [email or web address] - password [email or web address] [facility]  [place] [facility]  Office  [phone]  CC:  Primary care physician; Baruch Gouty, MD

## 2015-07-06 NOTE — Progress Notes (Signed)
Dr. Sherryll Burger notified at patient's request for ice chips. May have ice chips.

## 2015-07-06 NOTE — Progress Notes (Addendum)
Pt. C/o nausea/vomiting and abdominal pain not relieved with meds per MAR. Prime doc notified and new orders put in computer

## 2015-07-06 NOTE — Progress Notes (Signed)
Pt. Resting quietly with eyes closed at this time. C/O abdominal pain and nausea all through the night. Medicated per MAR. Up to bathroom with stand-by assist. IVF infusing. NPO. Will continue to monitor.

## 2015-07-06 NOTE — Progress Notes (Signed)
Pt. C/o pain 10 out of 10. Morphine  administered per IV.

## 2015-07-06 NOTE — Consult Note (Signed)
Pt with severe epigastric abd pain and vomiting.  Was supposed to get EGD but did not do this.  Will do EGD tomorrow afternoon.  Clear liq diet for now.

## 2015-07-06 NOTE — Progress Notes (Signed)
Dr. Markham Jordan stated patient can have clear liquid diet.

## 2015-07-06 NOTE — Progress Notes (Signed)
Pt. Started having nausea and started vomiting. Her pain level went up with the vomiting. Medicated for nausea but not time for pain meds yet. Will continue to monitor.

## 2015-07-06 NOTE — Progress Notes (Signed)
Benadryl given for nausea and morphine for abdominal pain.

## 2015-07-06 NOTE — Consult Note (Signed)
GI Inpatient Consult Note  Reason for Consult:  Abdominal pain, nausea, vomiting   Attending Requesting Consult: Dr. Sherryll Burger  History of Present Illness: Laurie Frederick is a 39 y.o. female who reports she started having severe vomiting Sunday night.  She reports she started  vomiting undigested food within minutes of eating dinner, which was spaghetti and  Pasta salad.  She reports that no one else who ate his supper became ill.  She continued to have episodes of emesis and then started vomiting up yellow and decided to go to emergency department.  She reports she started vomiting "slimy white mucus" this morning and remains nauseous.  She reports severe epigastric pain, 10/10, on controlled with pain medications.  She reports some morphine will help a little, but the pain comes right back.  She reports that she has heartburn and acid reflux on a fairly frequent basis, mostly if she eats late at night.  She denies taking anything to help relieve this.  She denies any changes in her bowel movements.  She  She reports her normal bowel movements are loose.  She denies NSAID use, will use Tylenol occasionally.  She was last seen at [facility] clinic in the GI department on February 26, 2014. At that time she had been having rectal bleeding as well as abdominal pain and cramping.   At that time she reported her pain was localized to the right upper quadrant area.  An ultrasound showed a 7.5 mm gallbladder wall polyp or stone.  She was supposed to see Dr. Renda Rolls on February 23, 2014 that did not make the appointment.    The plan at that time was for patient to use Anusol suppositories to help with the rectal bleeding, and a prescription for Bentyl 10 mg to help with abdominal cramping and pain which was probably related to her IBS.  During this workup she also had an MRI that showed hypoechoic lesions within the liver, the largest measures 2.4 cm.  The MRI showed the dominant lesion inferiorly in the  right hepatic lobe adjacent to the right kidney demonstrates enhancement  characteristics most consistent with focal nodular hyperplasia although is not entirely specific.  Follow-up with MRI in 6 months recommended to evaluate stability.   She had an upper endoscopy completed on February 09, 2014.  Dr. Riki Altes.  Indications; generalized abdominal pain,  Suspected esophageal reflux.  Findings; normal esophagus.  Normal stomach.  Normal examined duodenum.  Recommendations ; use omeprazole 20 mg p.o. Daily.  She has also had a colonoscopy on February 09, 2014. Dr. Bluford Kaufmann.  Indications; generalized abdominal pain, rectal bleeding.  Impression ; the entire examined colon was normal.  Probably has IBS.   Of note,  Patient was noted on last office visit to have  A history of depression, anxiety which was contributing to her overall symptoms at that time.  She also had an overall fear of cancer.  She did talk more about cancer today during our visit and reports losing several relatives recently to different types of cancers.   Patient was scheduled for small bowel series to be completed in July, 2015, this did not occur. The patient did not follow up in our office after that visit.   Past Medical History  Diagnosis Date  . Anxiety   . Depression     Problem List: Patient Active Problem List   Diagnosis Date Noted  . Gastroenteritis 07/06/2015  . Viral gastroenteritis 07/05/2015  . 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  Alcohol abuse 04/01/2015    Past Surgical History: Past Surgical History  Procedure Laterality Date  . Cesarean section      Allergies: No Known Allergies  Home Medications: Prescriptions prior to admission  Medication Sig Dispense Refill Last Dose  . alprazolam (XANAX) 2 MG tablet Take 2 mg by mouth 2 (two) times daily.   unknown at unknown  . naproxen (NAPROSYN) 500 MG tablet Take 1 tablet (500 mg total) by mouth 2 (two) times daily with a meal. (Patient not taking: Reported on  07/05/2015) 20 tablet 2 Not Taking at PRN  . zolpidem (AMBIEN) 10 MG tablet Take 10 mg by mouth at bedtime.   unknown at unknown   Home medication reconciliation was completed with the patient.   Scheduled Inpatient Medications:   . alprazolam  1 mg Oral BID  . nicotine  7 mg Transdermal Daily  . pantoprazole (PROTONIX) IV  40 mg Intravenous Q12H  . scopolamine  1 patch Transdermal Q72H    Continuous Inpatient Infusions:   . sodium chloride 100 mL/hr at 07/06/15 0621    PRN Inpatient Medications:  HYDROmorphone (DILAUDID) injection, iohexol, ondansetron (ZOFRAN) IV, zolpidem  Family History: family history includes Bone cancer in her paternal uncle; Lung cancer in her father and paternal aunt.    Social History:   reports that she has been smoking Cigarettes.  She has been smoking about 0.50 packs per day. She has never used smokeless tobacco. She reports that she drinks alcohol. She reports that she does not use illicit drugs.   Review of Systems: Constitutional: Weight is stable.  Eyes: No changes in vision. ENT: No oral lesions, sore throat.  GI: see HPI.  Heme/Lymph: No easy bruising.  CV: No chest pain.  GU: No hematuria.  Integumentary: No rashes.  Neuro: No headaches.  Psych: No depression/anxiety.  Endocrine: No heat/cold intolerance.  Allergic/Immunologic: No urticaria.  Resp: No cough, SOB.  Musculoskeletal: No joint swelling.    Physical Examination: BP 167/78 mmHg  Pulse 64  Temp(Src) 98.5 F (36.9 C) (Oral)  Resp 17  Ht  (1.575 m)  Wt 82.101 kg (181 lb)  BMI 33.10 kg/m2  SpO2 100%  LMP 07/04/2015 Gen: Im mild to moderate distress, alert and oriented x 4.  Patient had 2 episodes of emesis while I was in the room, became tearful. HEENT: PEERLA, EOMI, Neck: supple, no JVD or thyromegaly Chest: CTA bilaterally, no wheezes, crackles, or other adventitious sounds CV: RRR, no m/g/c/r Abd: soft, mild generalized tenderness, ND, +BS in all four  quadrants; no HSM, guarding, ridigity, or rebound tenderness Ext: no edema, well perfused with 2+ pulses, Skin: no rash or lesions noted Lymph: no LAD  Data: Lab Results  Component Value Date   WBC 7.9 07/06/2015   HGB 11.4* 07/06/2015   HCT 34.4* 07/06/2015   MCV 89.4 07/06/2015   PLT 212 07/06/2015    Recent Labs Lab 07/04/15 2324 07/06/15 0655  HGB 12.4 11.4*   Lab Results  Component Value Date   NA 142 07/06/2015   K 3.4* 07/06/2015   CL 110 07/06/2015   CO2 26 07/06/2015   BUN 10 07/06/2015   CREATININE 0.84 07/06/2015   Lab Results  Component Value Date   ALT 14 07/04/2015   AST 19 07/04/2015   ALKPHOS 67 07/04/2015   BILITOT 0.9 07/04/2015   No results for input(s): APTT, INR, PTT in the last 168 hours.   Imaging:  CLINICAL DATA: Mid abdominal pain, nausea  and vomiting since eating spaghetti and salad for dinner.  EXAM: CT ABDOMEN AND PELVIS WITH CONTRAST  TECHNIQUE: Multidetector CT imaging of the abdomen and pelvis was performed using the standard protocol following bolus administration of intravenous contrast.  CONTRAST: OMNIPAQUE IOHEXOL 300 MG/ML SOLN  COMPARISON: MRI abdomen 02/04/2014  FINDINGS: Lung bases are clear.  Focal fatty infiltration adjacent to the falciform ligament of the liver. Gallbladder, spleen, pancreas, adrenal glands, kidneys, abdominal aorta, inferior vena cava, and retroperitoneal lymph nodes are unremarkable. Stomach, small bowel, and colon are not abnormally distended. No discrete wall thickening is appreciated although evaluation of the bowel wall is limited due to under distention. No free air or free fluid in the abdomen. Minimal umbilical hernia containing fat.  Pelvis: Uterus is anteverted. Intrauterine device is present. Heterogeneous nodular enhancement of the uterus consistent with fibroids. No abnormal adnexal masses. No free or loculated pelvic fluid collections. No pelvic mass or  lymphadenopathy. Bladder wall is not thickened. Appendix is normal. No destructive bone lesions.  IMPRESSION: No focal acute process demonstrated in the abdomen or pelvis. No evidence of bowel obstruction or inflammation. Minimal umbilical hernia containing fat. Focal fatty infiltration in the liver.   Electronically Signed  By: Burman Nieves M.D.  On: 07/05/2015 06:51  Assessment/Plan: Ms. Morr is a 39 y.o. female with   Epigastric abdominal pain, nausea and vomiting.  Recommendations: We will proceed with an upper endoscopy tomorrow afternoon.  She may have clear liquids, nothing red or orange, until tomorrow at 9am.  We agree with Protonix BID. Thank you for the consult. Please call with questions or concerns.  Aggie Douse S Richards,PA-C  I personally performed these services.

## 2015-07-07 ENCOUNTER — Inpatient Hospital Stay: Payer: Medicaid Other | Admitting: Anesthesiology

## 2015-07-07 ENCOUNTER — Encounter
Admission: EM | Disposition: A | Discharge status: Home / Self Care | Payer: Self-pay | Source: Home / Self Care | Attending: Internal Medicine

## 2015-07-07 ENCOUNTER — Encounter: Payer: Self-pay | Admitting: *Deleted

## 2015-07-07 HISTORY — PX: ESOPHAGOGASTRODUODENOSCOPY (EGD) WITH PROPOFOL: SHX5813

## 2015-07-07 LAB — POTASSIUM: POTASSIUM: 3.7 mmol/L (ref 3.5–5.1)

## 2015-07-07 LAB — MAGNESIUM: MAGNESIUM: 2 mg/dL (ref 1.7–2.4)

## 2015-07-07 SURGERY — EGD (ESOPHAGOGASTRODUODENOSCOPY)
Anesthesia: Monitor Anesthesia Care

## 2015-07-07 SURGERY — ESOPHAGOGASTRODUODENOSCOPY (EGD) WITH PROPOFOL
Anesthesia: General | Laterality: Left

## 2015-07-07 MED ORDER — MIDAZOLAM HCL 2 MG/2ML IJ SOLN
INTRAMUSCULAR | Status: DC | PRN
  Administered 2015-07-07: 1 mg via INTRAVENOUS

## 2015-07-07 MED ORDER — SODIUM CHLORIDE 0.9 % IV SOLN
INTRAVENOUS | Status: DC
  Administered 2015-07-07 (×2): 1000 mL via INTRAVENOUS

## 2015-07-07 MED ORDER — FENTANYL CITRATE (PF) 100 MCG/2ML IJ SOLN
INTRAMUSCULAR | Status: DC | PRN
  Administered 2015-07-07: 50 ug via INTRAVENOUS

## 2015-07-07 MED ORDER — SUCRALFATE 1 GM/10ML PO SUSP
1.0000 g | Freq: Four times a day (QID) | ORAL | Status: DC
  Administered 2015-07-07 – 2015-07-09 (×8): 1 g via ORAL
  Filled 2015-07-07 (×8): qty 10

## 2015-07-07 MED ORDER — PROPOFOL 10 MG/ML IV BOLUS
INTRAVENOUS | Status: DC | PRN
  Administered 2015-07-07: 70 mg via INTRAVENOUS

## 2015-07-07 MED ORDER — PROPOFOL INFUSION 10 MG/ML OPTIME
INTRAVENOUS | Status: DC | PRN
  Administered 2015-07-07: 160 ug/kg/min via INTRAVENOUS

## 2015-07-07 MED ORDER — QUETIAPINE FUMARATE 25 MG PO TABS
50.0000 mg | ORAL_TABLET | Freq: Every day | ORAL | Status: DC
  Administered 2015-07-07 – 2015-07-08 (×2): 50 mg via ORAL
  Filled 2015-07-07 (×2): qty 2

## 2015-07-07 MED ORDER — CITALOPRAM HYDROBROMIDE 20 MG PO TABS
20.0000 mg | ORAL_TABLET | Freq: Every day | ORAL | Status: DC
  Administered 2015-07-07 – 2015-07-08 (×2): 20 mg via ORAL
  Filled 2015-07-07 (×2): qty 1

## 2015-07-07 MED ORDER — GLYCOPYRROLATE 0.2 MG/ML IJ SOLN
INTRAMUSCULAR | Status: DC | PRN
  Administered 2015-07-07: 0.2 mg via INTRAVENOUS

## 2015-07-07 NOTE — Transfer of Care (Signed)
Immediate Anesthesia Transfer of Care Note  Patient: Laurie Frederick  Procedure(s) Performed: Procedure(s): ESOPHAGOGASTRODUODENOSCOPY (EGD) WITH PROPOFOL (Left)  Patient Location: PACU  Anesthesia Type:General  Level of Consciousness: awake and patient cooperative  Airway & Oxygen Therapy: Patient Spontanous Breathing and Patient connected to nasal cannula oxygen  Post-op Assessment: Report given to RN and Post -op Vital signs reviewed and stable  Post vital signs: Reviewed and stable  Last Vitals:  Filed Vitals:   07/07/15 1341  BP: 114/71  Pulse: 62  Temp: 97.91F  Resp: 18    Complications: No apparent anesthesia complications

## 2015-07-07 NOTE — Consult Note (Signed)
Pt has gastritis typical for severe vomiting causing traumatic gastritis to the lesser curvature upper body of the stomach.  Biopsy done.  This will require a few days of very gentle diet such as clear liquids and advance to full liquids in a day or two and carafate slurry qid and IV PPI.  She should NOT be given potassium tablets or any other potentially caustic medication.  Should go home on PPI bid and carafate qid, can dissolve in 1-2 oz of water and the pills will be much cheaper than the prescription liquid.

## 2015-07-07 NOTE — Op Note (Signed)
[facility] Gastroenterology Patient Name: Laurie Frederick Procedure Date: 07/07/2015 12:48 PM MRN: [id number] Account #: [id number] Date of Birth: 1975-11-04 Admit Type: Inpatient Age: 40 Room: [facility] ENDO ROOM 3 Gender: Female Note Status: Finalized Procedure:         Upper GI endoscopy Indications:       Epigastric abdominal pain, Nausea with vomiting Providers:         Scot Jun, MD Referring MD:      No Local Md, MD (Referring MD) Medicines:         Propofol per Anesthesia Complications:     No immediate complications. Procedure:         Pre-Anesthesia Assessment:                    - After reviewing the risks and benefits, the patient was                     deemed in satisfactory condition to undergo the procedure.                    After obtaining informed consent, the endoscope was passed                     under direct vision. Throughout the procedure, the                     patient's blood pressure, pulse, and oxygen saturations                     were monitored continuously. The Olympus GIF-160 endoscope                     (S#. [id number]) was introduced through the mouth, and                     advanced to the second part of duodenum. The upper GI                     endoscopy was accomplished without difficulty. The patient                     tolerated the procedure well. Findings:      The examined esophagus was normal. GEJ 40cm.      Localized severe inflammation characterized by erythema, friability and       granularity was found localized on the proximal lesser curvature of the       gastric body. Biopsies were taken with a cold forceps for histology.       This looks like traumatic injury to the stomach from severe vomiting.      The gastric antrum was normal. The greater curvature was normal.      The examined duodenum was normal. Impression:        - Normal esophagus.                    - Acute gastritis. Biopsied.     - Normal antrum.                    - Normal examined duodenum. Recommendation:    - Await pathology results. Liquid diet, advance slowly to                     full liquid diet. Carafate slurry qid, PPI  bid both during                     and after hospitalization. Scot Jun, MD 07/07/2015 1:38:42 PM This report has been signed electronically. Number of Addenda: 0 Note Initiated On: 07/07/2015 12:48 PM      [facility]

## 2015-07-07 NOTE — Progress Notes (Addendum)
Spoke with Dr. Amado Coe regarding pt medications. Dr. Amado Coe ok to have home medications ordered. Pt stating that she takes Seroquel  at bedtime, and celexa  at bedtime.

## 2015-07-07 NOTE — Anesthesia Preprocedure Evaluation (Signed)
Anesthesia Evaluation  Patient identified by MRN, date of birth, ID band Patient awake    Reviewed: Allergy & Precautions, H&P , NPO status , Patient's Chart, lab work & pertinent test results, reviewed documented beta blocker date and time   History of Anesthesia Complications Negative for: history of anesthetic complications  Airway Mallampati: II  TM Distance: >3 FB Neck ROM: full    Dental  (+) Poor Dentition   Pulmonary neg shortness of breath, neg sleep apnea, neg COPD, neg recent URI, Current Smoker,    Pulmonary exam normal breath sounds clear to auscultation       Cardiovascular Exercise Tolerance: Good negative cardio ROS Normal cardiovascular exam Rhythm:regular Rate:Normal     Neuro/Psych PSYCHIATRIC DISORDERS (anxiety and depression) negative neurological ROS     GI/Hepatic negative GI ROS, Neg liver ROS,   Endo/Other  negative endocrine ROS  Renal/GU negative Renal ROS  negative genitourinary   Musculoskeletal   Abdominal   Peds  Hematology negative hematology ROS (+)   Anesthesia Other Findings Past Medical History:   Anxiety                                                      Depression                                                   Reproductive/Obstetrics negative OB ROS                             Anesthesia Physical Anesthesia Plan  ASA: II  Anesthesia Plan: General   Post-op Pain Management:    Induction:   Airway Management Planned:   Additional Equipment:   Intra-op Plan:   Post-operative Plan:   Informed Consent: I have reviewed the patients History and Physical, chart, labs and discussed the procedure including the risks, benefits and alternatives for the proposed anesthesia with the patient or authorized representative who has indicated his/her understanding and acceptance.   Dental Advisory Given  Plan Discussed with: Anesthesiologist, CRNA  and Surgeon  Anesthesia Plan Comments:         Anesthesia Quick Evaluation

## 2015-07-07 NOTE — Progress Notes (Signed)
Pt. Alert and oriented. VSS. Pt. C/o abdominal pain in abdomen throughout the night controlled with IV pain meds. No c/o nausea. Tolerating CLD. Will be NPO after 9 am for EGD today. Up ambulating to bathroom. Pt. Just received pain med and resting quietly at this time.

## 2015-07-07 NOTE — Care Management (Signed)
Met with patient as it appears she does not have health insurance. She states she "is supposed to have Medicaid". She denies needs. She states her PCP is with [facility] in [place]. Case closed.

## 2015-07-07 NOTE — Progress Notes (Signed)
[facility] [place] at [facility]   PATIENT NAME: Laurie Frederick    MR#:  [id number]  DATE OF BIRTH:  02-Oct-1976  SUBJECTIVE:  CHIEF COMPLAINT:   Chief Complaint  Patient presents with  . Abdominal Pain  feeling much better.  REVIEW OF SYSTEMS:  Review of Systems  Constitutional: Negative for fever, weight loss, malaise/fatigue and diaphoresis.  HENT: Negative for ear discharge, ear pain, hearing loss, nosebleeds, sore throat and tinnitus.   Eyes: Negative for blurred vision and pain.  Respiratory: Negative for cough, hemoptysis, shortness of breath and wheezing.   Cardiovascular: Negative for chest pain, palpitations, orthopnea and leg swelling.  Gastrointestinal: Positive for nausea. Negative for heartburn, vomiting, abdominal pain, diarrhea, constipation and blood in stool.  Genitourinary: Negative for dysuria, urgency and frequency.  Musculoskeletal: Negative for myalgias and back pain.  Skin: Negative for itching and rash.  Neurological: Negative for dizziness, tingling, tremors, focal weakness, seizures, weakness and headaches.  Psychiatric/Behavioral: Negative for depression. The patient is not nervous/anxious.    DRUG ALLERGIES:  No Known Allergies VITALS:  Blood pressure 126/64, pulse 65, temperature 98.8 F (37.1 C), temperature source Oral, resp. rate 18, height 5\' 2"  (1.575 m), weight 82.101 kg (181 lb), last menstrual period 07/04/2015, SpO2 99 %. PHYSICAL EXAMINATION:  Physical Exam  Constitutional: She is oriented to person, place, and time and well-developed, well-nourished, and in no distress.  HENT:  Head: Normocephalic and atraumatic.  Eyes: Conjunctivae and EOM are normal. Pupils are equal, round, and reactive to light.  Neck: Normal range of motion. Neck supple. No tracheal deviation present. No thyromegaly present.  Cardiovascular: Normal rate, regular rhythm and normal heart sounds.   Pulmonary/Chest: Effort normal and breath  sounds normal. No respiratory distress. She has no wheezes. She exhibits no tenderness.  Abdominal: Soft. Bowel sounds are normal. She exhibits no distension. There is tenderness in the epigastric area.  Musculoskeletal: Normal range of motion.  Neurological: She is alert and oriented to person, place, and time. No cranial nerve deficit.  Skin: Skin is warm and dry. No rash noted.  Psychiatric: Mood and affect normal.   LABORATORY PANEL:   CBC  Recent Labs Lab 07/06/15 0655  WBC 7.9  HGB 11.4*  HCT 34.4*  PLT 212   ------------------------------------------------------------------------------------------------------------------ Chemistries   Recent Labs Lab 07/04/15 2324 07/06/15 0523 07/07/15 0444  NA 140 142  --   K 3.6 3.4* 3.7  CL 104 110  --   CO2 27 26  --   GLUCOSE 103* 83  --   BUN 10 10  --   CREATININE 0.81 0.84  --   CALCIUM 9.5 9.0  --   MG  --   --  2.0  AST 19  --   --   ALT 14  --   --   ALKPHOS 67  --   --   BILITOT 0.9  --   --    RADIOLOGY:  No results found. ASSESSMENT AND PLAN:  * Severe abdominal pain, nausea, vomiting of unknown etiology Severe intractable vomiting, CT scan of abdomen is negative, no rise in white cell count. Planning for EGD this afternoon  * Hypokalemia Repleted and resolved  * Epigastric pain This might be secondary to nausea and vomiting - Gastritis, might be some mucosal damage. She didn't notice any blood in the vomitus yet. continue Protonix IV.  * Depression and anxiety Continue home medications - await psych input  * Smoking Counseled to quit  smoking for 4 minutes and offered a nicotine patch.    All the records are reviewed and case discussed with Care Management/Social Worker. Management plans discussed with the patient, family and they are in agreement.  CODE STATUS: Full code  TOTAL TIME TAKING CARE OF THIS PATIENT: 35 minutes.   More than 50% of the time was spent in  counseling/coordination of care: YES  POSSIBLE D/C IN 1-2 DAYS, DEPENDING ON CLINICAL CONDITION. And EGD   [facility], Denecia Brunette M.D on 07/07/2015 at 11:44 AM  Between 7am to 6pm - Pager - [phone]  After 6pm go to [email or web address] - password [email or web address] [facility]  [place] [facility]  Office  [phone]  CC: Primary care physician; Baruch Gouty, MD

## 2015-07-08 NOTE — Progress Notes (Signed)
[facility] - [place] at [facility]   PATIENT NAME: Laiba Fuerte    MR#:  [id number]  DATE OF BIRTH:  [birth year]  SUBJECTIVE:  CHIEF COMPLAINT:   Chief Complaint  Patient presents with  . Abdominal Pain  feeling much better. Tolerating CLD. REVIEW OF SYSTEMS:  Review of Systems  Constitutional: Negative for fever, weight loss, malaise/fatigue and diaphoresis.  HENT: Negative for ear discharge, ear pain, hearing loss, nosebleeds, sore throat and tinnitus.   Eyes: Negative for blurred vision and pain.  Respiratory: Negative for cough, hemoptysis, shortness of breath and wheezing.   Cardiovascular: Negative for chest pain, palpitations, orthopnea and leg swelling.  Gastrointestinal: Positive for nausea. Negative for heartburn, vomiting, abdominal pain, diarrhea, constipation and blood in stool.  Genitourinary: Negative for dysuria, urgency and frequency.  Musculoskeletal: Negative for myalgias and back pain.  Skin: Negative for itching and rash.  Neurological: Negative for dizziness, tingling, tremors, focal weakness, seizures, weakness and headaches.  Psychiatric/Behavioral: Negative for depression. The patient is not nervous/anxious.    DRUG ALLERGIES:  No Known Allergies VITALS:  Blood pressure 112/86, pulse 57, temperature 97.5 F (36.4 C), temperature source Oral, resp. rate 18, height  (1.575 m), weight 82.101 kg (181 lb), last menstrual period 07/05/2015, SpO2 100 %. PHYSICAL EXAMINATION:  Physical Exam  Constitutional: She is oriented to person, place, and time and well-developed, well-nourished, and in no distress.  HENT:  Head: Normocephalic and atraumatic.  Eyes: Conjunctivae and EOM are normal. Pupils are equal, round, and reactive to light.  Neck: Normal range of motion. Neck supple. No tracheal deviation present. No thyromegaly present.  Cardiovascular: Normal rate, regular rhythm and normal heart sounds.   Pulmonary/Chest: Effort  normal and breath sounds normal. No respiratory distress. She has no wheezes. She exhibits no tenderness.  Abdominal: Soft. Bowel sounds are normal. She exhibits no distension. There is tenderness in the epigastric area.  Musculoskeletal: Normal range of motion.  Neurological: She is alert and oriented to person, place, and time. No cranial nerve deficit.  Skin: Skin is warm and dry. No rash noted.  Psychiatric: Mood and affect normal.   LABORATORY PANEL:   CBC  Recent Labs Lab 07/06/15 0655  WBC 7.9  HGB 11.4*  HCT 34.4*  PLT 212   ------------------------------------------------------------------------------------------------------------------ Chemistries   Recent Labs Lab 07/04/15 2324 07/06/15 0523 07/07/15 0444  NA 140 142  --   K 3.6 3.4* 3.7  CL 104 110  --   CO2 27 26  --   GLUCOSE 103* 83  --   BUN 10 10  --   CREATININE 0.81 0.84  --   CALCIUM 9.5 9.0  --   MG  --   --  2.0  AST 19  --   --   ALT 14  --   --   ALKPHOS 67  --   --   BILITOT 0.9  --   --    RADIOLOGY:  No results found. ASSESSMENT AND PLAN:  * Severe abdominal pain, nausea, vomiting of unknown etiology Severe intractable vomiting, CT scan of abdomen is negative, no rise in white cell count. S/p EGD on 9/7 showing gastritis typical for severe vomiting causing traumatic gastritis to the lesser curvature upper body of the stomach. Biopsy done.  Per GI - This will require a few days of very gentle diet such as clear liquids and advance to full liquids in a day or two and carafate slurry qid and IV PPI.  Should go home on PPI bid and carafate qid, can dissolve in 1-2 oz of water and the pills will be much cheaper than the prescription liquid.  * Hypokalemia Repleted and resolved She should NOT be given potassium tablets or any other potentially caustic medication per GI  * Epigastric pain Due to Gastritis She didn't notice any blood in the vomitus yet. continue Protonix IV.  *  Depression and anxiety Continue home medications - await psych input  * Smoking Counseled to quit smoking for 4 minutes and offered a nicotine patch.    All the records are reviewed and case discussed with Care Management/Social Worker. Management plans discussed with the patient, family and they are in agreement.  CODE STATUS: Full code  TOTAL TIME TAKING CARE OF THIS PATIENT: 35 minutes.   More than 50% of the time was spent in counseling/coordination of care: YES  POSSIBLE D/C IN AM, DEPENDING ON CLINICAL CONDITION   [facility], Maame Dack M.D on 07/08/2015 at 1:29 PM  Between 7am to 6pm - Pager - [phone]  After 6pm go to [email or web address] - password [email or web address] [facility]  [place] [facility]  Office  [phone]  CC: Primary care physician; Baruch Gouty, MD

## 2015-07-08 NOTE — Consult Note (Signed)
Pt feeling a little better. Discussed proper diet and avoiding of alcohol, spicy food, NSAID, and the need to take medicine for her gastritis for several weeks, at least 4 weeks.  I will sign off.  Dr. Shelle Iron on call tomorrow if things get worse.

## 2015-07-09 ENCOUNTER — Encounter: Payer: Self-pay | Admitting: Unknown Physician Specialty

## 2015-07-09 MED ORDER — TRAMADOL HCL 50 MG PO TABS
50.0000 mg | ORAL_TABLET | Freq: Once | ORAL | Status: AC
  Administered 2015-07-09: 50 mg via ORAL
  Filled 2015-07-09: qty 1

## 2015-07-09 MED ORDER — HYDROMORPHONE HCL 2 MG PO TABS: 2.0000 mg | ORAL_TABLET | ORAL | Status: DC | PRN

## 2015-07-09 MED ORDER — PANTOPRAZOLE SODIUM 40 MG PO TBEC: 40.0000 mg | DELAYED_RELEASE_TABLET | Freq: Every day | ORAL | Status: DC

## 2015-07-09 MED ORDER — QUETIAPINE FUMARATE 50 MG PO TABS: 50.0000 mg | ORAL_TABLET | Freq: Every day | ORAL | Status: DC

## 2015-07-09 MED ORDER — CITALOPRAM HYDROBROMIDE 20 MG PO TABS: 20.0000 mg | ORAL_TABLET | Freq: Every day | ORAL | Status: DC

## 2015-07-09 MED ORDER — SUCRALFATE 1 G PO TABS: 1.0000 g | ORAL_TABLET | Freq: Three times a day (TID) | ORAL | Status: DC

## 2015-07-09 MED ORDER — TRAMADOL HCL 50 MG PO TABS: 50.0000 mg | ORAL_TABLET | Freq: Two times a day (BID) | ORAL | Status: DC | PRN

## 2015-07-09 NOTE — Discharge Summary (Signed)
[facility] - [place] at [facility]   PATIENT NAME: Laurie Frederick    MR#:  [id number]  DATE OF BIRTH:  03/21/1976  DATE OF ADMISSION:  07/05/2015 ADMITTING PHYSICIAN: Altamese Dilling, MD  DATE OF DISCHARGE: 07/09/2015 11:31 AM  PRIMARY CARE PHYSICIAN: Baruch Gouty, MD    ADMISSION DIAGNOSIS:  Gastritis [K29.70] Epigastric pain [R10.13] Intractable abdominal pain [R10.9] DISCHARGE DIAGNOSIS:  Principal Problem:   Viral gastroenteritis Active Problems:   Gastroenteritis  SECONDARY DIAGNOSIS:   Past Medical History  Diagnosis Date  . Anxiety   . Depression    HOSPITAL COURSE:  39 y.o. female with a known history of depression, was admitted with severe epigastric abd pain and vomiting.  Please see Dr. Jim Desanctis dictated history and physical for further details.  GI consultation was obtained with Dr. Mechele Collin recommended EGD which was performed on seventh of September showing gastritis typical for severe vomiting causing traumatic gastritis to the lesser curvature upper body of the stomach. Biopsy were done.   Patient was started on clear liquids and advanced to full liquids which she tolerated it.  He was also started on carafate slurry qid and IV PPI. She was instructed not to use potassium tablets or any other potentially caustic medication.   Her pain was improved quite a bit.  Did not have any further vomiting and she tolerated soft diet today without much difficulty, although she did have some pain for which she was requesting narcotics.  I have given 5 tablets of Dilaudid for extreme breakthrough pain.  Otherwise, I have instructed her to use was extra strength Tylenol and some tramadol as needed.  She is agreeable with the discharge plan and is discharged home in stable condition. DISCHARGE CONDITIONS:  good CONSULTS OBTAINED:  Treatment Team:  Scot Jun, MD DRUG ALLERGIES:  No Known Allergies DISCHARGE MEDICATIONS:   Discharge  Medication List as of 07/09/2015 10:11 AM    START taking these medications   Details  citalopram (CELEXA) 20 MG tablet Take 1 tablet (20 mg total) by mouth at bedtime., Starting 07/09/2015, Until Discontinued, Normal    HYDROmorphone (DILAUDID) 2 MG tablet Take 1 tablet (2 mg total) by mouth every 4 (four) hours as needed for severe pain., Starting 07/09/2015, Until Discontinued, Print    metoCLOPramide (REGLAN) 10 MG tablet Take 1 tablet (10 mg total) by mouth 3 (three) times daily with meals., Starting 07/05/2015, Until Tue 07/04/16, Print    pantoprazole (PROTONIX) 40 MG tablet Take 1 tablet (40 mg total) by mouth daily., Starting 07/09/2015, Until Discontinued, Normal    QUEtiapine (SEROQUEL) 50 MG tablet Take 1 tablet (50 mg total) by mouth at bedtime., Starting 07/09/2015, Until Discontinued, Normal    sucralfate (CARAFATE) 1 G tablet Take 1 tablet (1 g total) by mouth 4 (four) times daily -  with meals and at bedtime. Dissolve in water and take it., Starting 07/09/2015, Until Discontinued, Normal    traMADol (ULTRAM) 50 MG tablet Take 1 tablet (50 mg total) by mouth every 12 (twelve) hours as needed for moderate pain., Starting 07/09/2015, Until Discontinued, Print      CONTINUE these medications which have NOT CHANGED   Details  alprazolam (XANAX) 2 MG tablet Take 2 mg by mouth 2 (two) times daily., Until Discontinued, Historical Med    zolpidem (AMBIEN) 10 MG tablet Take 10 mg by mouth at bedtime., Until Discontinued, Historical Med      STOP taking these medications     naproxen (NAPROSYN)  500 MG tablet        DISCHARGE INSTRUCTIONS:  She should NOT be given potassium tablets or any other potentially caustic medication. DIET:  Regular diet - Discussed proper diet and avoiding of alcohol, spicy food, NSAID, and the need to take medicine for her gastritis for several weeks, at least 4 weeks DISCHARGE CONDITION:  Good ACTIVITY:  Activity as tolerated OXYGEN:  Home Oxygen: No.  Oxygen  Delivery: room air DISCHARGE LOCATION:  home   If you experience worsening of your admission symptoms, develop shortness of breath, life threatening emergency, suicidal or homicidal thoughts you must seek medical attention immediately by calling 911 or calling your MD immediately  if symptoms less severe.  You Must read complete instructions/literature along with all the possible adverse reactions/side effects for all the Medicines you take and that have been prescribed to you. Take any new Medicines after you have completely understood and accpet all the possible adverse reactions/side effects.   Please note  You were cared for by a hospitalist during your hospital stay. If you have any questions about your discharge medications or the care you received while you were in the hospital after you are discharged, you can call the unit and asked to speak with the hospitalist on call if the hospitalist that took care of you is not available. Once you are discharged, your primary care physician will handle any further medical issues. Please note that NO REFILLS for any discharge medications will be authorized once you are discharged, as it is imperative that you return to your primary care physician (or establish a relationship with a primary care physician if you do not have one) for your aftercare needs so that they can reassess your need for medications and monitor your lab values.    On the day of Discharge: VITAL SIGNS:  Blood pressure 130/45, pulse 82, temperature 98.4 F (36.9 C), temperature source Oral, resp. rate 18, height 5\' 2"  (1.575 m), weight 181 lb (82.101 kg), last menstrual period 07/05/2015, SpO2 100 %. PHYSICAL EXAMINATION:  GENERAL:  39 y.o.-year-old patient lying in the bed with no acute distress.  EYES: Pupils equal, round, reactive to light and accommodation. No scleral icterus. Extraocular muscles intact.  HEENT: Head atraumatic, normocephalic. Oropharynx and nasopharynx  clear.  NECK:  Supple, no jugular venous distention. No thyroid enlargement, no tenderness.  LUNGS: Normal breath sounds bilaterally, no wheezing, rales,rhonchi or crepitation. No use of accessory muscles of respiration.  CARDIOVASCULAR: S1, S2 normal. No murmurs, rubs, or gallops.  ABDOMEN: Soft, non-tender, non-distended. Bowel sounds present. No organomegaly or mass.  EXTREMITIES: No pedal edema, cyanosis, or clubbing.  NEUROLOGIC: Cranial nerves II through XII are intact. Muscle strength 5/5 in all extremities. Sensation intact. Gait not checked.  PSYCHIATRIC: The patient is alert and oriented x 3.  SKIN: No obvious rash, lesion, or ulcer.  DATA REVIEW:   CBC  Recent Labs Lab 07/06/15 0655  WBC 7.9  HGB 11.4*  HCT 34.4*  PLT 212    Chemistries   Recent Labs Lab 07/04/15 2324 07/06/15 0523 07/07/15 0444  NA 140 142  --   K 3.6 3.4* 3.7  CL 104 110  --   CO2 27 26  --   GLUCOSE 103* 83  --   BUN 10 10  --   CREATININE 0.81 0.84  --   CALCIUM 9.5 9.0  --   MG  --   --  2.0  AST 19  --   --  ALT 14  --   --   ALKPHOS 67  --   --   BILITOT 0.9  --   --     Management plans discussed with the patient, family and they are in agreement.  CODE STATUS: Full code  TOTAL TIME TAKING CARE OF THIS PATIENT: 55 minutes.    [facility], Blanchard Willhite M.D on 07/09/2015 at 4:13 PM  Between 7am to 6pm - Pager - [phone]  After 6pm go to [email or web address] - password [email or web address] [facility]  [facility]  Office  [phone]  CC: Primary care physician; Baruch Gouty, MD Scot Jun, MD

## 2015-07-09 NOTE — Plan of Care (Signed)
No c/o nausea/vomiting during this shift.  Patient A&O. No acute distress or SOB  noted. Will continue to monitor patient.

## 2015-07-09 NOTE — Discharge Instructions (Signed)
1. Start Protonix 40 milligrams twice daily (#60). 2. Start Reglan 10 mg 3 times daily with meals (#90). 3. Start Carafate 4 times daily and dissolve in 1-2 oz of water 4. Don't take any potentially caustic medications like potassium tablets.   5. Full liquids 12 hours, then soft bland diet 3 days, then slowly advance diet as tolerated. Avoid spicy, greasy foods and dairy. 6. Return to the ER/Call your PCP/GI for worsening symptoms, persistent vomiting, difficulty breathing or other concerns.  Nausea and Vomiting Nausea is a sick feeling that often comes before throwing up (vomiting). Vomiting is a reflex where stomach contents come out of your mouth. Vomiting can cause severe loss of body fluids (dehydration). Children and elderly adults can become dehydrated quickly, especially if they also have diarrhea. Nausea and vomiting are symptoms of a condition or disease. It is important to find the cause of your symptoms. CAUSES   Direct irritation of the stomach lining. This irritation can result from increased acid production (gastroesophageal reflux disease), infection, food poisoning, taking certain medicines (such as nonsteroidal anti-inflammatory drugs), alcohol use, or tobacco use.  Signals from the brain.These signals could be caused by a headache, heat exposure, an inner ear disturbance, increased pressure in the brain from injury, infection, a tumor, or a concussion, pain, emotional stimulus, or metabolic problems.  An obstruction in the gastrointestinal tract (bowel obstruction).  Illnesses such as diabetes, hepatitis, gallbladder problems, appendicitis, kidney problems, cancer, sepsis, atypical symptoms of a heart attack, or eating disorders.  Medical treatments such as chemotherapy and radiation.  Receiving medicine that makes you sleep (general anesthetic) during surgery. DIAGNOSIS Your caregiver may ask for tests to be done if the problems do not improve after a few days. Tests  may also be done if symptoms are severe or if the reason for the nausea and vomiting is not clear. Tests may include:  Urine tests.  Blood tests.  Stool tests.  Cultures (to look for evidence of infection).  X-rays or other imaging studies. Test results can help your caregiver make decisions about treatment or the need for additional tests. TREATMENT You need to stay well hydrated. Drink frequently but in small amounts.You may wish to drink water, sports drinks, clear broth, or eat frozen ice pops or gelatin dessert to help stay hydrated.When you eat, eating slowly may help prevent nausea.There are also some antinausea medicines that may help prevent nausea. HOME CARE INSTRUCTIONS   Take all medicine as directed by your caregiver.  If you do not have an appetite, do not force yourself to eat. However, you must continue to drink fluids.  If you have an appetite, eat a normal diet unless your caregiver tells you differently.  Eat a variety of complex carbohydrates (rice, wheat, potatoes, bread), lean meats, yogurt, fruits, and vegetables.  Avoid high-fat foods because they are more difficult to digest.  Drink enough water and fluids to keep your urine clear or pale yellow.  If you are dehydrated, ask your caregiver for specific rehydration instructions. Signs of dehydration may include:  Severe thirst.  Dry lips and mouth.  Dizziness.  Dark urine.  Decreasing urine frequency and amount.  Confusion.  Rapid breathing or pulse. SEEK IMMEDIATE MEDICAL CARE IF:   You have blood or brown flecks (like coffee grounds) in your vomit.  You have black or bloody stools.  You have a severe headache or stiff neck.  You are confused.  You have severe abdominal pain.  You have chest pain or  trouble breathing.  You do not urinate at least once every 8 hours.  You develop cold or clammy skin.  You continue to vomit for longer than 24 to 48 hours.  You have a fever. MAKE  SURE YOU:   Understand these instructions.  Will watch your condition.  Will get help right away if you are not doing well or get worse. Document Released: 10/16/2005 Document Revised: 01/08/2012 Document Reviewed: 03/15/2011 [facility] Patient Information 2015 [place], [place]. This information is not intended to replace advice given to you by your health care provider. Make sure you discuss any questions you have with your health care provider.  Gastritis, Adult Gastritis is soreness and swelling (inflammation) of the lining of the stomach. Gastritis can develop as a sudden onset (acute) or long-term (chronic) condition. If gastritis is not treated, it can lead to stomach bleeding and ulcers. CAUSES  Gastritis occurs when the stomach lining is weak or damaged. Digestive juices from the stomach then inflame the weakened stomach lining. The stomach lining may be weak or damaged due to viral or bacterial infections. One common bacterial infection is the Helicobacter pylori infection. Gastritis can also result from excessive alcohol consumption, taking certain medicines, or having too much acid in the stomach.  SYMPTOMS  In some cases, there are no symptoms. When symptoms are present, they may include:  Pain or a burning sensation in the upper abdomen.  Nausea.  Vomiting.  An uncomfortable feeling of fullness after eating. DIAGNOSIS  Your caregiver may suspect you have gastritis based on your symptoms and a physical exam. To determine the cause of your gastritis, your caregiver may perform the following:  Blood or stool tests to check for the H pylori bacterium.  Gastroscopy. A thin, flexible tube (endoscope) is passed down the esophagus and into the stomach. The endoscope has a light and camera on the end. Your caregiver uses the endoscope to view the inside of the stomach.  Taking a tissue sample (biopsy) from the stomach to examine under a microscope. TREATMENT  Depending on the cause  of your gastritis, medicines may be prescribed. If you have a bacterial infection, such as an H pylori infection, antibiotics may be given. If your gastritis is caused by too much acid in the stomach, H2 blockers or antacids may be given. Your caregiver may recommend that you stop taking aspirin, ibuprofen, or other nonsteroidal anti-inflammatory drugs (NSAIDs). HOME CARE INSTRUCTIONS  Only take over-the-counter or prescription medicines as directed by your caregiver.  If you were given antibiotic medicines, take them as directed. Finish them even if you start to feel better.  Drink enough fluids to keep your urine clear or pale yellow.  Avoid foods and drinks that make your symptoms worse, such as:  Caffeine or alcoholic drinks.  Chocolate.  Peppermint or mint flavorings.  Garlic and onions.  Spicy foods.  Citrus fruits, such as oranges, lemons, or limes.  Tomato-based foods such as sauce, chili, salsa, and pizza.  Fried and fatty foods.  Eat small, frequent meals instead of large meals. SEEK IMMEDIATE MEDICAL CARE IF:   You have black or dark red stools.  You vomit blood or material that looks like coffee grounds.  You are unable to keep fluids down.  Your abdominal pain gets worse.  You have a fever.  You do not feel better after 1 week.  You have any other questions or concerns. MAKE SURE YOU:  Understand these instructions.  Will watch your condition.  Will get  help right away if you are not doing well or get worse. Document Released: 10/10/2001 Document Revised: 04/16/2012 Document Reviewed: 11/29/2011 [facility] Patient Information 2015 [place], [place]. This information is not intended to replace advice given to you by your health care provider. Make sure you discuss any questions you have with your health care provider.

## 2015-07-09 NOTE — Progress Notes (Signed)
Pt requesting dilaudid for abd pain. Informed pt that she would be going home today and we need to help manager her pain w/o iv narcotic.  Paged MD for orders.

## 2015-07-09 NOTE — Anesthesia Postprocedure Evaluation (Signed)
  Anesthesia Post-op Note  Patient: Laurie Frederick  Procedure(s) Performed: Procedure(s): ESOPHAGOGASTRODUODENOSCOPY (EGD) WITH PROPOFOL (Left)  Anesthesia type:General  Patient location: PACU  Post pain: Pain level controlled  Post assessment: Post-op Vital signs reviewed, Patient's Cardiovascular Status Stable, Respiratory Function Stable, Patent Airway and No signs of Nausea or vomiting  Post vital signs: Reviewed and stable  Last Vitals:  Filed Vitals:   07/09/15 0744  BP: 130/45  Pulse: 82  Temp: 36.9 C  Resp: 18    Level of consciousness: awake, alert  and patient cooperative  Complications: No apparent anesthesia complications

## 2015-07-11 LAB — SURGICAL PATHOLOGY

## 2015-07-12 ENCOUNTER — Encounter: Payer: Self-pay | Admitting: Emergency Medicine

## 2015-07-12 ENCOUNTER — Emergency Department
Admission: EM | Admit: 2015-07-12 | Discharge: 2015-07-12 | Disposition: A | Discharge status: Home / Self Care | Payer: Medicaid Other | Attending: Emergency Medicine | Admitting: Emergency Medicine

## 2015-07-12 DIAGNOSIS — Z720 Tobacco use: Secondary | ICD-10-CM | POA: Insufficient documentation

## 2015-07-12 DIAGNOSIS — Z3202 Encounter for pregnancy test, result negative: Secondary | ICD-10-CM | POA: Insufficient documentation

## 2015-07-12 DIAGNOSIS — Z79899 Other long term (current) drug therapy: Secondary | ICD-10-CM | POA: Insufficient documentation

## 2015-07-12 DIAGNOSIS — F419 Anxiety disorder, unspecified: Secondary | ICD-10-CM | POA: Diagnosis not present

## 2015-07-12 DIAGNOSIS — R1110 Vomiting, unspecified: Secondary | ICD-10-CM | POA: Diagnosis present

## 2015-07-12 DIAGNOSIS — Z765 Malingerer [conscious simulation]: Secondary | ICD-10-CM | POA: Insufficient documentation

## 2015-07-12 DIAGNOSIS — K2970 Gastritis, unspecified, without bleeding: Secondary | ICD-10-CM | POA: Diagnosis not present

## 2015-07-12 LAB — COMPREHENSIVE METABOLIC PANEL
ALBUMIN: 4.2 g/dL (ref 3.5–5.0)
ALT: 18 U/L (ref 14–54)
ANION GAP: 10 (ref 5–15)
AST: 25 U/L (ref 15–41)
Alkaline Phosphatase: 72 U/L (ref 38–126)
BILIRUBIN TOTAL: 0.7 mg/dL (ref 0.3–1.2)
CHLORIDE: 104 mmol/L (ref 101–111)
CO2: 28 mmol/L (ref 22–32)
Calcium: 9.2 mg/dL (ref 8.9–10.3)
Creatinine, Ser: 0.8 mg/dL (ref 0.44–1.00)
GFR calc Af Amer: >60 mL/min (ref >60)
GFR calc non Af Amer: >60 mL/min (ref >60)
GLUCOSE: 125 mg/dL — AB (ref 65–99)
POTASSIUM: 3 mmol/L — AB (ref 3.5–5.1)
SODIUM: 142 mmol/L (ref 135–145)
TOTAL PROTEIN: 7.7 g/dL (ref 6.5–8.1)

## 2015-07-12 LAB — URINALYSIS COMPLETE WITH MICROSCOPIC (ARMC ONLY)
Bilirubin Urine: NEGATIVE
GLUCOSE, UA: NEGATIVE mg/dL
Ketones, ur: NEGATIVE mg/dL
NITRITE: NEGATIVE
PH: 7 (ref 5.0–8.0)
Protein, ur: NEGATIVE mg/dL
SPECIFIC GRAVITY, URINE: 1.011 (ref 1.005–1.030)

## 2015-07-12 LAB — CBC WITH DIFFERENTIAL/PLATELET
BASOS ABS: 0 10*3/uL (ref 0–0.1)
BASOS PCT: 0 %
EOS ABS: 0.5 10*3/uL (ref 0–0.7)
Eosinophils Relative: 7 %
HEMATOCRIT: 38.9 % (ref 35.0–47.0)
Hemoglobin: 12.8 g/dL (ref 12.0–16.0)
Lymphocytes Relative: 23 %
Lymphs Abs: 1.7 10*3/uL (ref 1.0–3.6)
MCH: 29.5 pg (ref 26.0–34.0)
MCHC: 33 g/dL (ref 32.0–36.0)
MCV: 89.4 fL (ref 80.0–100.0)
MONO ABS: 0.5 10*3/uL (ref 0.2–0.9)
MONOS PCT: 6 %
NEUTROS ABS: 4.7 10*3/uL (ref 1.4–6.5)
Neutrophils Relative %: 64 %
PLATELETS: 232 10*3/uL (ref 150–440)
RBC: 4.34 MIL/uL (ref 3.80–5.20)
RDW: 15 % — AB (ref 11.5–14.5)
WBC: 7.4 10*3/uL (ref 3.6–11.0)

## 2015-07-12 LAB — LIPASE, BLOOD: Lipase: 22 U/L (ref 22–51)

## 2015-07-12 MED ORDER — ONDANSETRON HCL 4 MG/2ML IJ SOLN
4.0000 mg | Freq: Once | INTRAMUSCULAR | Status: AC
  Administered 2015-07-12: 4 mg via INTRAVENOUS
  Filled 2015-07-12: qty 2

## 2015-07-12 MED ORDER — HYDROCODONE-ACETAMINOPHEN 5-325 MG PO TABS: 1.0000 | ORAL_TABLET | Freq: Four times a day (QID) | ORAL | Status: DC | PRN

## 2015-07-12 MED ORDER — POTASSIUM CHLORIDE CRYS ER 20 MEQ PO TBCR
20.0000 meq | EXTENDED_RELEASE_TABLET | Freq: Once | ORAL | Status: AC
  Administered 2015-07-12: 20 meq via ORAL
  Filled 2015-07-12: qty 1

## 2015-07-12 MED ORDER — HYDROMORPHONE HCL 2 MG PO TABS
2.0000 mg | ORAL_TABLET | Freq: Once | ORAL | Status: AC
Start: 2015-07-12 — End: 2015-07-12
  Administered 2015-07-12: 2 mg via ORAL
  Filled 2015-07-12: qty 1

## 2015-07-12 MED ORDER — SUCRALFATE 1 G PO TABS
1.0000 g | ORAL_TABLET | Freq: Once | ORAL | Status: AC
  Administered 2015-07-12: 1 g via ORAL
  Filled 2015-07-12: qty 1

## 2015-07-12 MED ORDER — LORAZEPAM 2 MG/ML IJ SOLN
1.0000 mg | Freq: Once | INTRAMUSCULAR | Status: AC
  Administered 2015-07-12: 1 mg via INTRAVENOUS
  Filled 2015-07-12: qty 1

## 2015-07-12 MED ORDER — CIPROFLOXACIN HCL 500 MG PO TABS: 500.0000 mg | ORAL_TABLET | Freq: Two times a day (BID) | ORAL | Status: AC

## 2015-07-12 MED ORDER — HYDROMORPHONE HCL 2 MG PO TABS
ORAL_TABLET | ORAL | Status: DC
Start: 2015-07-12 — End: 2015-07-12
  Filled 2015-07-12: qty 1

## 2015-07-12 MED ORDER — GI COCKTAIL ~~LOC~~
30.0000 mL | Freq: Once | ORAL | Status: AC
  Administered 2015-07-12: 30 mL via ORAL
  Filled 2015-07-12: qty 30

## 2015-07-12 MED ORDER — TRAMADOL HCL 50 MG PO TABS
50.0000 mg | ORAL_TABLET | Freq: Once | ORAL | Status: AC
Start: 2015-07-12 — End: 2015-07-12
  Administered 2015-07-12: 50 mg via ORAL
  Filled 2015-07-12: qty 1

## 2015-07-12 MED ORDER — PROMETHAZINE HCL 25 MG RE SUPP: 25.0000 mg | Freq: Four times a day (QID) | RECTAL | Status: DC | PRN

## 2015-07-12 MED ORDER — SODIUM CHLORIDE 0.9 % IV SOLN
1000.0000 mL | Freq: Once | INTRAVENOUS | Status: AC
  Administered 2015-07-12: 1000 mL via INTRAVENOUS

## 2015-07-12 MED ORDER — METOCLOPRAMIDE HCL 5 MG/ML IJ SOLN
10.0000 mg | Freq: Once | INTRAMUSCULAR | Status: AC
  Administered 2015-07-12: 10 mg via INTRAVENOUS
  Filled 2015-07-12: qty 2

## 2015-07-12 NOTE — ED Notes (Signed)
Pt to triage via w/c, actively vomiting; pt seen here recently for same and dx with gastritis

## 2015-07-12 NOTE — ED Provider Notes (Signed)
Time Seen: Approximately ----------------------------------------- 7:47 AM on 07/12/2015 -----------------------------------------   I have reviewed the triage notes  Chief Complaint: Emesis   History of Present Illness: Laurie Frederick is a 39 y.o. female who presents after being recently discharged from the hospital with extensive GI evaluation. She states she failed to get her Zofran filled and her tramadol. She states she is returning with the same issue of persistent vomiting and stomach pain. Patient's had a recent upper GI exam which showed some gastritis and has also had a recent abdominal pelvic CT on September 5 which overall was negative. She was diagnosed with persisting gastritis and discharged recently and states that she does not have the money to get the Zofran filled. He denies any new complaints such as fever or hematemesis. She denies any back or flank pain that's new and states that she had some upper back pain while she was here in the hospital which is "" gotten worse "". She denies any melena or hematochezia. She denies any dysuria or hematuria. She denies any vaginal discharge or bleeding.   Past Medical History  Diagnosis Date  . Anxiety   . Depression     Patient Active Problem List   Diagnosis Date Noted  . Gastroenteritis 07/06/2015  . Viral gastroenteritis 07/05/2015  . 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 Alcohol abuse 04/01/2015    Past Surgical History  Procedure Laterality Date  . Cesarean section    . Esophagogastroduodenoscopy (egd) with propofol Left 07/07/2015    Procedure: ESOPHAGOGASTRODUODENOSCOPY (EGD) WITH PROPOFOL;  Surgeon: Scot Jun, MD;  Location: [facility] ENDOSCOPY;  Service: Endoscopy;  Laterality: Left;    Past Surgical History  Procedure Laterality Date  . Cesarean section    . Esophagogastroduodenoscopy (egd) with propofol Left 07/07/2015    Procedure: ESOPHAGOGASTRODUODENOSCOPY (EGD) WITH PROPOFOL;  Surgeon: Scot Jun, MD;  Location: [facility] ENDOSCOPY;  Service: Endoscopy;  Laterality: Left;    Current Outpatient Rx  Name  Route  Sig  Dispense  Refill  . alprazolam (XANAX) 2 MG tablet   Oral   Take 2 mg by mouth 2 (two) times daily.         . citalopram (CELEXA) 20 MG tablet   Oral   Take 1 tablet (20 mg total) by mouth at bedtime.   30 tablet   0   . HYDROmorphone (DILAUDID) 2 MG tablet   Oral   Take 1 tablet (2 mg total) by mouth every 4 (four) hours as needed for severe pain.   5 tablet   0   . metoCLOPramide (REGLAN) 10 MG tablet   Oral   Take 1 tablet (10 mg total) by mouth 3 (three) times daily with meals.   90 tablet   1   . pantoprazole (PROTONIX) 40 MG tablet   Oral   Take 1 tablet (40 mg total) by mouth daily.   60 tablet   0   . QUEtiapine (SEROQUEL) 50 MG tablet   Oral   Take 1 tablet (50 mg total) by mouth at bedtime.   30 tablet   0   . sucralfate (CARAFATE) 1 G tablet   Oral   Take 1 tablet (1 g total) by mouth 4 (four) times daily -  with meals and at bedtime. Dissolve in water and take it.   120 tablet   0   . traMADol (ULTRAM) 50 MG tablet   Oral   Take 1 tablet (50 mg total) by mouth  every 12 (twelve) hours as needed for moderate pain.   10 tablet   0   . zolpidem (AMBIEN) 10 MG tablet   Oral   Take 10 mg by mouth at bedtime.           Allergies:  Review of patient's allergies indicates no known allergies.  Family History: Family History  Problem Relation Age of Onset  . Lung cancer Father   . Lung cancer Paternal Aunt   . Bone cancer Paternal Uncle     Social History: Social History  Substance Use Topics  . Smoking status: Current Some Day Smoker -- 0.50 packs/day    Types: Cigarettes  . Smokeless tobacco: Never Used  . Alcohol Use: Yes     Comment: occasional     Review of Systems:   10 point review of systems was performed and was otherwise negative:  Constitutional: No fever Eyes: No visual disturbances ENT: No  sore throat, ear pain Cardiac: No chest pain Respiratory: No shortness of breath, wheezing, or stridor Abdomen: No abdominal pain, no vomiting, No diarrhea Endocrine: No weight loss, No night sweats Extremities: No peripheral edema, cyanosis Skin: No rashes, easy bruising Neurologic: No focal weakness, trouble with speech or swollowing Urologic: No dysuria, Hematuria, or urinary frequency   Physical Exam:  ED Triage Vitals  Enc Vitals Group     BP 07/12/15 0645 140/92 mmHg     Pulse Rate 07/12/15 0645 93     Resp 07/12/15 0645 22     Temp --      Temp src --      SpO2 07/12/15 0645 99 %     Weight 07/12/15 0645 181 lb (82.101 kg)     Height 07/12/15 0645 5\' 2"  (1.575 m)     Head Cir --      Peak Flow --      Pain Score 07/12/15 0648 10     Pain Loc --      Pain Edu? --      Excl. in GC? --     General: Awake , Alert , and Oriented times 3; very anxious, dry heaves GCS 15 Head: Normal cephalic , atraumatic Eyes: Pupils equal , round, reactive to light Nose/Throat: No nasal drainage, patent upper airway without erythema or exudate.  Neck: Supple, Full range of motion, No anterior adenopathy or palpable thyroid masses Lungs: Clear to ascultation without wheezes , rhonchi, or rales Heart: Regular rate, regular rhythm without murmurs , gallops , or rubs Abdomen: Soft, non tender without rebound, guarding , or rigidity; bowel sounds positive and symmetric in all 4 quadrants. No organomegaly .        Extremities: 2 plus symmetric pulses. No edema, clubbing or cyanosis Neurologic: normal ambulation, Motor symmetric without deficits, sensory intact Skin: warm, dry, no rashes   Labs:   All laboratory work was reviewed including any pertinent negatives or positives listed below:  Labs Reviewed  CBC WITH DIFFERENTIAL/PLATELET  COMPREHENSIVE METABOLIC PANEL  LIPASE, BLOOD  URINALYSIS COMPLETEWITH MICROSCOPIC ([facility] ONLY)  POC URINE PREG, ED   laboratory work reviewed showed  some minor hypokalemia   Reexamination: Reexamination shows no peritoneal findings. Negative Murphy's sign, negative tenderness over McBurney's point  ED Course:  Patient had a poor urine sample but does show some leukocytes and she was given some prescription for Cipro as an outpatient. I think most of her persistent vomiting and discomforts mostly psychogenic. She's had an extensive workup here at the hospital which  shows gastritis. The patient was given a fluid bolus along with Reglan 10 mg IV Ativan 1 mg IV GI cocktail by mouth 1, Carafate by mouth 1, Ultram by mouth 1,.The amount of gastritis discomfort that she is expressing does not seem to be consistent with the objective findings on her studies. Patient has plenty of prescription medication that she was discharged with and some of it has been filled in some has not. She was asked if she had the means to fill the prescriptions states that she gets her child support today and should be able to fill her medications on an outpatient basis. She has taken all of her Dilaudid that she was prescribed with and I gave her a short prescription for Lortabs but advised her that this would be the final narcotic prescription that she would receive until she demonstrates further outpatient management. She states she wants a second opinion and she was referred to gastroenterology unassigned. She was advised to return here if she has any new findings such as hematemesis or blood in her stool. She was also prescribed Phenergan suppositories for persistent vomiting. Patient was requesting in-hospital management for her pain which I declined. I advised her there did not seem to be any findings indicative of her laboratory results were all the objective studies or just performed a require further inpatient management.    Assessment: Acute epigastric pain History of-induced gastritis Drug-seeking behavior   Final Clinical Impression:  Gastritis Final  diagnoses:  None     Plan:  Patient was advised to return immediately if condition worsens. Patient was advised to follow up with her primary care physician or other specialized physicians involved and in their current assessment.            Jennye Moccasin, MD 07/12/15 1002

## 2015-07-12 NOTE — Discharge Instructions (Signed)
Gastritis, Adult °Gastritis is soreness and puffiness (inflammation) of the lining of the stomach. If you do not get help, gastritis can cause bleeding and sores (ulcers) in the stomach. °HOME CARE  °· Only take medicine as told by your doctor. °· If you were given antibiotic medicines, take them as told. Finish the medicines even if you start to feel better. °· Drink enough fluids to keep your pee (urine) clear or pale yellow. °· Avoid foods and drinks that make your problems worse. Foods you may want to avoid include: °¨ Caffeine or alcohol. °¨ Chocolate. °¨ Mint. °¨ Garlic and onions. °¨ Spicy foods. °¨ Citrus fruits, including oranges, lemons, or limes. °¨ Food containing tomatoes, including sauce, chili, salsa, and pizza. °¨ Fried and fatty foods. °· Eat small meals throughout the day instead of large meals. °GET HELP RIGHT AWAY IF:  °· You have black or dark red poop (stools). °· You throw up (vomit) blood. It may look like coffee grounds. °· You cannot keep fluids down. °· Your belly (abdominal) pain gets worse. °· You have a fever. °· You do not feel better after 1 week. °· You have any other questions or concerns. °MAKE SURE YOU:  °· Understand these instructions. °· Will watch your condition. °· Will get help right away if you are not doing well or get worse. °Document Released: 04/03/2008 Document Revised: 01/08/2012 Document Reviewed: 11/29/2011 °ExitCare® Patient Information ©2015 [employer]. This information is not intended to replace advice given to you by your health care provider. Make sure you discuss any questions you have with your health care provider. ° °

## 2015-07-13 ENCOUNTER — Inpatient Hospital Stay
Admission: EM | Admit: 2015-07-13 | Discharge: 2015-07-16 | DRG: 392 | Disposition: A | Discharge status: Home / Self Care | Payer: Medicaid Other | Attending: Internal Medicine | Admitting: Internal Medicine

## 2015-07-13 DIAGNOSIS — G4730 Sleep apnea, unspecified: Secondary | ICD-10-CM | POA: Diagnosis present

## 2015-07-13 DIAGNOSIS — E559 Vitamin D deficiency, unspecified: Secondary | ICD-10-CM | POA: Diagnosis present

## 2015-07-13 DIAGNOSIS — F329 Major depressive disorder, single episode, unspecified: Secondary | ICD-10-CM | POA: Diagnosis present

## 2015-07-13 DIAGNOSIS — E860 Dehydration: Secondary | ICD-10-CM | POA: Diagnosis present

## 2015-07-13 DIAGNOSIS — K7689 Other specified diseases of liver: Secondary | ICD-10-CM | POA: Insufficient documentation

## 2015-07-13 DIAGNOSIS — B9681 Helicobacter pylori [H. pylori] as the cause of diseases classified elsewhere: Secondary | ICD-10-CM | POA: Diagnosis present

## 2015-07-13 DIAGNOSIS — Z6830 Body mass index (BMI) 30.0-30.9, adult: Secondary | ICD-10-CM | POA: Diagnosis not present

## 2015-07-13 DIAGNOSIS — E041 Nontoxic single thyroid nodule: Secondary | ICD-10-CM | POA: Diagnosis present

## 2015-07-13 DIAGNOSIS — F4310 Post-traumatic stress disorder, unspecified: Secondary | ICD-10-CM | POA: Diagnosis present

## 2015-07-13 DIAGNOSIS — Z79899 Other long term (current) drug therapy: Secondary | ICD-10-CM | POA: Diagnosis not present

## 2015-07-13 DIAGNOSIS — D649 Anemia, unspecified: Secondary | ICD-10-CM | POA: Insufficient documentation

## 2015-07-13 DIAGNOSIS — E785 Hyperlipidemia, unspecified: Secondary | ICD-10-CM | POA: Diagnosis present

## 2015-07-13 DIAGNOSIS — K2970 Gastritis, unspecified, without bleeding: Secondary | ICD-10-CM | POA: Diagnosis present

## 2015-07-13 DIAGNOSIS — E669 Obesity, unspecified: Secondary | ICD-10-CM | POA: Diagnosis present

## 2015-07-13 DIAGNOSIS — R1115 Cyclical vomiting syndrome unrelated to migraine: Secondary | ICD-10-CM

## 2015-07-13 DIAGNOSIS — F17210 Nicotine dependence, cigarettes, uncomplicated: Secondary | ICD-10-CM | POA: Diagnosis present

## 2015-07-13 DIAGNOSIS — K219 Gastro-esophageal reflux disease without esophagitis: Secondary | ICD-10-CM | POA: Diagnosis present

## 2015-07-13 DIAGNOSIS — E8889 Other specified metabolic disorders: Secondary | ICD-10-CM

## 2015-07-13 DIAGNOSIS — R030 Elevated blood-pressure reading, without diagnosis of hypertension: Secondary | ICD-10-CM | POA: Diagnosis present

## 2015-07-13 DIAGNOSIS — N390 Urinary tract infection, site not specified: Secondary | ICD-10-CM | POA: Diagnosis present

## 2015-07-13 DIAGNOSIS — R112 Nausea with vomiting, unspecified: Secondary | ICD-10-CM | POA: Diagnosis present

## 2015-07-13 DIAGNOSIS — E876 Hypokalemia: Secondary | ICD-10-CM | POA: Diagnosis present

## 2015-07-13 DIAGNOSIS — K2960 Other gastritis without bleeding: Secondary | ICD-10-CM | POA: Diagnosis present

## 2015-07-13 DIAGNOSIS — K2990 Gastroduodenitis, unspecified, without bleeding: Secondary | ICD-10-CM

## 2015-07-13 DIAGNOSIS — K824 Cholesterolosis of gallbladder: Secondary | ICD-10-CM | POA: Insufficient documentation

## 2015-07-13 LAB — COMPREHENSIVE METABOLIC PANEL
ALT: 15 U/L (ref 14–54)
AST: 20 U/L (ref 15–41)
Albumin: 4.5 g/dL (ref 3.5–5.0)
Alkaline Phosphatase: 68 U/L (ref 38–126)
Anion gap: 13 (ref 5–15)
BILIRUBIN TOTAL: 0.7 mg/dL (ref 0.3–1.2)
BUN: 7 mg/dL (ref 6–20)
CO2: 26 mmol/L (ref 22–32)
CREATININE: 0.7 mg/dL (ref 0.44–1.00)
Calcium: 9.6 mg/dL (ref 8.9–10.3)
Chloride: 103 mmol/L (ref 101–111)
GFR calc Af Amer: >60 mL/min (ref >60)
Glucose, Bld: 113 mg/dL — ABNORMAL HIGH (ref 65–99)
POTASSIUM: 3.2 mmol/L — AB (ref 3.5–5.1)
Sodium: 142 mmol/L (ref 135–145)
TOTAL PROTEIN: 8.1 g/dL (ref 6.5–8.1)

## 2015-07-13 LAB — CBC
HEMATOCRIT: 38.9 % (ref 35.0–47.0)
Hemoglobin: 12.7 g/dL (ref 12.0–16.0)
MCH: 29.2 pg (ref 26.0–34.0)
MCHC: 32.6 g/dL (ref 32.0–36.0)
MCV: 89.5 fL (ref 80.0–100.0)
PLATELETS: 282 10*3/uL (ref 150–440)
RBC: 4.35 MIL/uL (ref 3.80–5.20)
RDW: 15 % — AB (ref 11.5–14.5)
WBC: 9.3 10*3/uL (ref 3.6–11.0)

## 2015-07-13 LAB — URINALYSIS COMPLETE WITH MICROSCOPIC (ARMC ONLY)
Bilirubin Urine: NEGATIVE
Glucose, UA: NEGATIVE mg/dL
HGB URINE DIPSTICK: NEGATIVE
LEUKOCYTES UA: NEGATIVE
Nitrite: NEGATIVE
PROTEIN: 100 mg/dL — AB
SPECIFIC GRAVITY, URINE: 1.03 (ref 1.005–1.030)
pH: 6 (ref 5.0–8.0)

## 2015-07-13 LAB — LIPASE, BLOOD: Lipase: 37 U/L (ref 22–51)

## 2015-07-13 LAB — POCT PREGNANCY, URINE: PREG TEST UR: NEGATIVE

## 2015-07-13 MED ORDER — ACETAMINOPHEN 325 MG PO TABS: 650.0000 mg | ORAL_TABLET | Freq: Four times a day (QID) | ORAL | Status: DC | PRN

## 2015-07-13 MED ORDER — ALPRAZOLAM 0.5 MG PO TABS
2.0000 mg | ORAL_TABLET | Freq: Two times a day (BID) | ORAL | Status: DC | PRN
  Administered 2015-07-13: 2 mg via ORAL
  Filled 2015-07-13 (×3): qty 4

## 2015-07-13 MED ORDER — DEXTROSE-NACL 5-0.9 % IV SOLN
Freq: Once | INTRAVENOUS | Status: AC
  Administered 2015-07-13: 1000 mL via INTRAVENOUS

## 2015-07-13 MED ORDER — ENOXAPARIN SODIUM 40 MG/0.4ML ~~LOC~~ SOLN
40.0000 mg | SUBCUTANEOUS | Status: DC
  Administered 2015-07-13 – 2015-07-15 (×3): 40 mg via SUBCUTANEOUS
  Filled 2015-07-13 (×4): qty 0.4

## 2015-07-13 MED ORDER — ONDANSETRON 4 MG PO TBDP
ORAL_TABLET | ORAL | Status: AC
  Filled 2015-07-13: qty 1

## 2015-07-13 MED ORDER — ONDANSETRON HCL 4 MG/2ML IJ SOLN
4.0000 mg | Freq: Once | INTRAMUSCULAR | Status: AC
  Administered 2015-07-13: 4 mg via INTRAVENOUS
  Filled 2015-07-13: qty 2

## 2015-07-13 MED ORDER — HYDROCODONE-ACETAMINOPHEN 5-325 MG PO TABS
1.0000 | ORAL_TABLET | ORAL | Status: DC | PRN
  Administered 2015-07-13: 1 via ORAL
  Filled 2015-07-13: qty 1

## 2015-07-13 MED ORDER — ACETAMINOPHEN 650 MG RE SUPP: 650.0000 mg | Freq: Four times a day (QID) | RECTAL | Status: DC | PRN

## 2015-07-13 MED ORDER — SODIUM CHLORIDE 0.9 % IV SOLN
INTRAVENOUS | Status: DC
  Administered 2015-07-13 – 2015-07-14 (×2): via INTRAVENOUS

## 2015-07-13 MED ORDER — ONDANSETRON HCL 4 MG/2ML IJ SOLN
4.0000 mg | Freq: Four times a day (QID) | INTRAMUSCULAR | Status: DC | PRN
  Administered 2015-07-13 – 2015-07-14 (×2): 4 mg via INTRAVENOUS
  Filled 2015-07-13 (×2): qty 2

## 2015-07-13 MED ORDER — ALUM & MAG HYDROXIDE-SIMETH 200-200-20 MG/5ML PO SUSP: 30.0000 mL | Freq: Four times a day (QID) | ORAL | Status: DC | PRN

## 2015-07-13 MED ORDER — ZOLPIDEM TARTRATE 5 MG PO TABS
5.0000 mg | ORAL_TABLET | Freq: Every evening | ORAL | Status: DC | PRN
  Administered 2015-07-14: 5 mg via ORAL
  Filled 2015-07-13: qty 1

## 2015-07-13 MED ORDER — SENNOSIDES-DOCUSATE SODIUM 8.6-50 MG PO TABS: 1.0000 | ORAL_TABLET | Freq: Every evening | ORAL | Status: DC | PRN

## 2015-07-13 MED ORDER — ONDANSETRON HCL 4 MG PO TABS: 4.0000 mg | ORAL_TABLET | Freq: Four times a day (QID) | ORAL | Status: DC | PRN

## 2015-07-13 MED ORDER — MORPHINE SULFATE (PF) 2 MG/ML IV SOLN
2.0000 mg | INTRAVENOUS | Status: DC | PRN
  Administered 2015-07-13 – 2015-07-14 (×2): 2 mg via INTRAVENOUS
  Filled 2015-07-13 (×2): qty 1

## 2015-07-13 MED ORDER — HYDROMORPHONE HCL 1 MG/ML IJ SOLN
0.5000 mg | INTRAMUSCULAR | Status: AC | PRN
  Administered 2015-07-13 (×2): 0.5 mg via INTRAVENOUS
  Filled 2015-07-13 (×2): qty 1

## 2015-07-13 MED ORDER — ONDANSETRON 4 MG PO TBDP
4.0000 mg | ORAL_TABLET | Freq: Once | ORAL | Status: AC | PRN
  Administered 2015-07-13: 4 mg via ORAL

## 2015-07-13 NOTE — ED Notes (Signed)
Pt here with reports of abdominal pain and she has not been able to eat in four days   She was hospitalized here on Sunday 07/04/15 and discharged last Friday  She reports that she has not been able to keep anything down even medicine since leaving here   10/10 pain during triage

## 2015-07-13 NOTE — ED Provider Notes (Signed)
[facility] Emergency Department Provider Note  ____________________________________________  Time seen: 2:05 PM  I have reviewed the triage vital signs and the nursing notes.   HISTORY  Chief Complaint Abdominal Pain  nausea vomiting    HPI Laurie Frederick is a 39 y.o. female who was recently admitted to the hospital due to similar symptoms of abdominal pain and vomiting. She reports that since being discharged hospital she has had ongoing vomiting hasn't has not been able to tolerate any food, fluids, or medications by mouth.  The patient presented the hospital yesterday as well. She was evaluated and treated for her gastritis and nausea vomiting. She was prescribed Phenergan suppositories and she had run out of her pain medication and was prescribed Lortab.  She reports pain in the epigastric area.   She denies any diarrhea currently.   Past Medical History  Diagnosis Date  . Sleep apnea   . GERD (gastroesophageal reflux disease)   . Hyperlipidemia   . Depression   . Anxiety   . PTSD (post-traumatic stress disorder)   . Obesity   . Anemia   . Vitamin D deficiency   . Nontoxic uninodular goiter     Patient Active Problem List   Diagnosis Date Noted  . Anemia 07/13/2015  . Hyperlipidemia 07/13/2015  . Vitamin D deficiency 07/13/2015  . Thyroid nodule 07/13/2015  . Polyp of gallbladder 07/13/2015  . Liver nodule 07/13/2015    Past Surgical History  Procedure Laterality Date  . Cesarean section      Current Outpatient Rx  Name  Route  Sig  Dispense  Refill  . alprazolam (XANAX) 2 MG tablet   Oral   Take 2 mg by mouth 2 (two) times daily.         . citalopram (CELEXA) 40 MG tablet   Oral   Take by mouth. Take 2 tablets daily         . gabapentin (NEURONTIN) 300 MG capsule   Oral   Take 300 mg by mouth 2 (two) times daily.         . meloxicam (MOBIC) 15 MG tablet   Oral   Take 15 mg by mouth daily as needed for pain.        Marland Kitchen QUEtiapine (SEROQUEL) 50 MG tablet   Oral   Take by mouth. Take 2 tablets daily at bedtime         . Vitamin D, Ergocalciferol, (DRISDOL) 50000 UNITS CAPS capsule   Oral   Take 50,000 Units by mouth every 7 (seven) days.         Marland Kitchen zolpidem (AMBIEN) 10 MG tablet   Oral   Take 10 mg by mouth at bedtime.           Allergies Review of patient's allergies indicates no known allergies.  Family History  Problem Relation Age of Onset  . Depression Mother   . Anxiety disorder Mother   . Arthritis Mother   . Hyperlipidemia Mother   . Cancer Father     lung cancer  . Alcohol abuse Father   . Hypertension Maternal Grandmother   . Diabetes Paternal Grandmother   . Heart disease Paternal Grandmother   . Hypertension Paternal Grandmother     Social History Social History  Substance Use Topics  . Smoking status: Current Some Day Smoker  . Smokeless tobacco: Not on file  . Alcohol Use: No    Review of Systems  Constitutional: Negative for fever. ENT: Negative for  sore throat. Cardiovascular: Negative for chest pain. Respiratory: Negative for shortness of breath. Gastrointestinal: Positive for abdominal pain, vomiting. Genitourinary: Negative for dysuria. Musculoskeletal: No myalgias or injuries. Skin: Negative for rash. Neurological: Negative for headaches   10-point ROS otherwise negative.  ____________________________________________   PHYSICAL EXAM:  VITAL SIGNS: ED Triage Vitals  Enc Vitals Group     BP 07/13/15 0847 145/96 mmHg     Pulse Rate 07/13/15 0847 64     Resp 07/13/15 0847 19     Temp 07/13/15 0847 98.3 F (36.8 C)     Temp Source 07/13/15 0847 Oral     SpO2 07/13/15 0847 100 %     Weight 07/13/15 0847 181 lb (82.101 kg)     Height 07/13/15 0847 5\' 3"  (1.6 m)     Head Cir --      Peak Flow --      Pain Score 07/13/15 0905 10     Pain Loc --      Pain Edu? --      Excl. in GC? --     Constitutional: Alert and oriented.  Appears uncomfortable and somewhat ill. She has a bag of emesis asked her. ENT   Head: Normocephalic and atraumatic.   Nose: No congestion/rhinnorhea.   Mouth/Throat: Mucous membranes are moist. Cardiovascular: Normal rate, regular rhythm, no murmur noted Respiratory:  Normal respiratory effort, no tachypnea.    Breath sounds are clear and equal bilaterally.  Gastrointestinal: Soft with tenderness in the epigastric area. No distention.  Back: No muscle spasm, no tenderness, no CVA tenderness. Musculoskeletal: No deformity noted. Nontender with normal range of motion in all extremities.  No noted edema. Neurologic:  Normal speech and language. No gross focal neurologic deficits are appreciated.  Skin:  Skin is warm, dry. No rash noted. Psychiatric: Intact thought process. Overall appropriate.  ____________________________________________    LABS (pertinent positives/negatives)  Labs Reviewed  URINALYSIS COMPLETEWITH MICROSCOPIC ([facility] ONLY) - Abnormal; Notable for the following:    Color, Urine YELLOW (*)    APPearance CLEAR (*)    Ketones, ur 2+ (*)    Protein, ur 100 (*)    Bacteria, UA RARE (*)    Squamous Epithelial / LPF 0-5 (*)    All other components within normal limits  COMPREHENSIVE METABOLIC PANEL - Abnormal; Notable for the following:    Potassium 3.2 (*)    Glucose, Bld 113 (*)    All other components within normal limits  CBC - Abnormal; Notable for the following:    RDW 15.0 (*)    All other components within normal limits  LIPASE, BLOOD     ____________________________________________   INITIAL IMPRESSION / ASSESSMENT AND PLAN / ED COURSE  Pertinent labs & imaging results that were available during my care of the patient were reviewed by me and considered in my medical decision making (see chart for details).   Uncomfortable appearing 39 year old female with recent hospital admission for gastritis with nausea and vomiting. She is failing  outpatient management with Phenergan suppositories and other antiemetics. She has 2+ ketones noted in her urine. I will treat her with a liter of D5 normal saline, Zofran, and a half milligram of Dilaudid.    ----------------------------------------- 3:37 PM on 07/13/2015 -----------------------------------------  Patient feels a little bit better after 800 mL's of D5 normal saline and Zofran. We will provide a by mouth challenge.  ----------------------------------------- 4:55 PM on 07/13/2015 -----------------------------------------  Patient's nausea has returned. She is not able tolerate by  mouth's. She has been failing outpatient management. We discussed this with the hospitalist service for evaluation and admission to the hospital.  ____________________________________________   FINAL CLINICAL IMPRESSION(S) / ED DIAGNOSES  Final diagnoses:  Refractory nausea and vomiting  Ketosis  Intractable cyclical vomiting with nausea      Darien Ramus, MD 07/13/15 1656

## 2015-07-13 NOTE — H&P (Signed)
[facility] [place] [facility]   PATIENT NAME: Laurie Frederick    MR#:  [id number]  DATE OF BIRTH:  Mar 29, 1976  DATE OF ADMISSION:  07/13/2015  PRIMARY CARE PHYSICIAN: Baruch Gouty, MD   REQUESTING/REFERRING PHYSICIAN: Dr. Carollee Massed  CHIEF COMPLAINT:  Nausea and vomiting HISTORY OF PRESENT ILLNESS:  Laurie Frederick  is a 39 y.o. female with a known history of gastritis who was recently discharged on the hospital after almost a week stay with persistent nausea and vomiting. Patient reports that she had an EGD performed which showed gastritis and the thought was that her nausea and vomiting was secondary to gastritis. She was discharged and able to tolerate a liquid diet however today she comes in with persistent nausea and vomiting and unable to take in medications. She has been trying Zofran for nausea but due to nausea and vomiting she is unable to tolerate this. She also has midepigastric abdominal pain which was present during her last hospital physician. She had an EGD during her last auscultation which showed gastritis. At this time I'm unable to obtain records from her last hospitalization as the record indicates the patient has been marked for potential merged. The emergency not yet occurred, sedated for this patient may be incomplete.  PAST MEDICAL HISTORY:   Past Medical History  Diagnosis Date  . Sleep apnea   . GERD (gastroesophageal reflux disease)   . Hyperlipidemia   . Depression   . Anxiety   . PTSD (post-traumatic stress disorder)   . Obesity   . Anemia   . Vitamin D deficiency   . Nontoxic uninodular goiter     PAST SURGICAL HISTORY:   Past Surgical History  Procedure Laterality Date  . Cesarean section      SOCIAL HISTORY:  None  FAMILY HISTORY:   Family History  Problem Relation Age of Onset  . Depression Mother   . Anxiety disorder Mother   . Arthritis Mother   . Hyperlipidemia Mother   . Cancer Father     lung  cancer  . Alcohol abuse Father   . Hypertension Maternal Grandmother   . Diabetes Paternal Grandmother   . Heart disease Paternal Grandmother   . Hypertension Paternal Grandmother     DRUG ALLERGIES:  No Known Allergies   REVIEW OF SYSTEMS:  CONSTITUTIONAL: No fever, fatigue , positive weakness.  EYES: No blurred or double vision.  EARS, NOSE, AND THROAT: No tinnitus or ear pain.  RESPIRATORY: No cough, shortness of breath, wheezing or hemoptysis.  CARDIOVASCULAR: No chest pain, orthopnea, edema.  GASTROINTESTINAL: Positive nausea, vomiting, no diarrhea positive abdominal pain.  GENITOURINARY: No dysuria, hematuria.  ENDOCRINE: No polyuria, nocturia,  HEMATOLOGY: No anemia, easy bruising or bleeding SKIN: No rash or lesion. MUSCULOSKELETAL: No joint pain or arthritis.   NEUROLOGIC: No tingling, numbness, weakness.  PSYCHIATRY: No anxiety or depression.   MEDICATIONS AT HOME:   Prior to Admission medications   Medication Sig Start Date End Date Taking? Authorizing Provider  alprazolam Prudy Feeler) 2 MG tablet Take 2 mg by mouth 2 (two) times daily.    Historical Provider, MD  citalopram (CELEXA) 40 MG tablet Take by mouth. Take 2 tablets daily    Historical Provider, MD  QUEtiapine (SEROQUEL) 50 MG tablet Take by mouth. Take 2 tablets daily at bedtime    Historical Provider, MD  zolpidem (AMBIEN) 10 MG tablet Take 10 mg by mouth at bedtime.    Historical Provider, MD  VITAL SIGNS:  Blood pressure 153/83, pulse 56, temperature 98.4 F (36.9 C), temperature source Oral, resp. rate 16, height 5\' 3"  (1.6 m), weight 82.101 kg (181 lb), last menstrual period 07/06/2015, SpO2 100 %.  PHYSICAL EXAMINATION:  GENERAL:  39 y.o.-year-old patient lying in the bed with no acute distress.  EYES: Pupils equal, round, reactive to light and accommodation. No scleral icterus. Extraocular muscles intact.  HEENT: Head atraumatic, normocephalic. Oropharynx  clear.  NECK:  Supple, no jugular  venous distention. No thyroid enlargement, no tenderness.  LUNGS: Normal breath sounds bilaterally, no wheezing, rales,rhonchi or crepitation. No use of accessory muscles of respiration.  CARDIOVASCULAR: S1, S2 normal. No murmurs, rubs, or gallops.  ABDOMEN: Soft, nontender, nondistended. Bowel sounds present. No organomegaly or mass.  EXTREMITIES: No pedal edema, cyanosis, or clubbing.  NEUROLOGIC: Cranial nerves II through XII are grossly intact. No focal deficits. PSYCHIATRIC: The patient is alert and oriented x 3.  SKIN: No obvious rash, lesion, or ulcer.   LABORATORY PANEL:   CBC  Recent Labs Lab 07/13/15 0901  WBC 9.3  HGB 12.7  HCT 38.9  PLT 282   ------------------------------------------------------------------------------------------------------------------  Chemistries   Recent Labs Lab 07/13/15 0901  NA 142  K 3.2*  CL 103  CO2 26  GLUCOSE 113*  BUN 7  CREATININE 0.70  CALCIUM 9.6  AST 20  ALT 15  ALKPHOS 68  BILITOT 0.7   ------------------------------------------------------------------------------------------------------------------  Cardiac Enzymes No results for input(s): TROPONINI in the last 168 hours. ------------------------------------------------------------------------------------------------------------------  RADIOLOGY:  No results found.  EKG:  No orders found for this or any previous visit.  IMPRESSION AND PLAN:  This is a 39 year old female who was recently admitted to hospital with a diagnosis of gastritis who presents with persistent nausea and vomiting.  1. Nausea and vomiting: This could be secondary to gastritis or cyclic vomiting syndrome. I will continue with conservative management. We can consider another GI evaluation if after 1-2 days her symptoms are relatively uncontrolled.  2. Hypokalemia: This is secondary to problem #1. Potassium will be repleted and rechecked in a.m.    All the records are reviewed and case  discussed with ED provider. Management plans discussed with the patient and she is in agreement.  CODE STATUS: Full  TOTAL TIME TAKING CARE OF THIS PATIENT: 40 minutes.    Nachman Sundt M.D on 07/13/2015 at 4:56 PM  Between 7am to 6pm - Pager - [phone] After 6pm go to [email or web address] - password [email or web address] [facility]  [facility]  Office  [phone]  CC: Primary care physician; Baruch Gouty, MD

## 2015-07-14 ENCOUNTER — Inpatient Hospital Stay: Payer: Self-pay | Admitting: Family Medicine

## 2015-07-14 LAB — COMPREHENSIVE METABOLIC PANEL
ALT: 11 U/L — AB (ref 14–54)
ANION GAP: 8 (ref 5–15)
AST: 11 U/L — ABNORMAL LOW (ref 15–41)
Albumin: 3.7 g/dL (ref 3.5–5.0)
Alkaline Phosphatase: 53 U/L (ref 38–126)
BUN: 8 mg/dL (ref 6–20)
CHLORIDE: 105 mmol/L (ref 101–111)
CO2: 28 mmol/L (ref 22–32)
CREATININE: 0.61 mg/dL (ref 0.44–1.00)
Calcium: 8.7 mg/dL — ABNORMAL LOW (ref 8.9–10.3)
Glucose, Bld: 99 mg/dL (ref 65–99)
Potassium: 3 mmol/L — ABNORMAL LOW (ref 3.5–5.1)
SODIUM: 141 mmol/L (ref 135–145)
Total Bilirubin: 0.9 mg/dL (ref 0.3–1.2)
Total Protein: 6.6 g/dL (ref 6.5–8.1)

## 2015-07-14 LAB — URINE CULTURE

## 2015-07-14 MED ORDER — ALPRAZOLAM 0.5 MG PO TABS
2.0000 mg | ORAL_TABLET | Freq: Two times a day (BID) | ORAL | Status: DC
  Administered 2015-07-14 – 2015-07-15 (×3): 2 mg via ORAL
  Filled 2015-07-14: qty 4

## 2015-07-14 MED ORDER — METOCLOPRAMIDE HCL 5 MG/ML IJ SOLN
5.0000 mg | Freq: Three times a day (TID) | INTRAMUSCULAR | Status: DC
  Administered 2015-07-14 – 2015-07-16 (×7): 5 mg via INTRAVENOUS
  Filled 2015-07-14 (×7): qty 2

## 2015-07-14 MED ORDER — METOCLOPRAMIDE HCL 5 MG/ML IJ SOLN
10.0000 mg | Freq: Once | INTRAMUSCULAR | Status: AC
  Administered 2015-07-14: 10 mg via INTRAVENOUS
  Filled 2015-07-14: qty 2

## 2015-07-14 MED ORDER — HYDROCODONE-ACETAMINOPHEN 10-325 MG PO TABS
1.0000 | ORAL_TABLET | ORAL | Status: DC | PRN
  Administered 2015-07-14: 2 via ORAL
  Administered 2015-07-14: 1 via ORAL
  Administered 2015-07-15 – 2015-07-16 (×6): 2 via ORAL
  Filled 2015-07-14 (×6): qty 2
  Filled 2015-07-14: qty 1
  Filled 2015-07-14: qty 2

## 2015-07-14 MED ORDER — HYDRALAZINE HCL 20 MG/ML IJ SOLN
10.0000 mg | Freq: Four times a day (QID) | INTRAMUSCULAR | Status: DC | PRN
  Administered 2015-07-14: 10 mg via INTRAVENOUS
  Filled 2015-07-14: qty 1

## 2015-07-14 MED ORDER — DICYCLOMINE HCL 20 MG PO TABS
20.0000 mg | ORAL_TABLET | Freq: Three times a day (TID) | ORAL | Status: DC
  Administered 2015-07-14 – 2015-07-16 (×5): 20 mg via ORAL
  Filled 2015-07-14 (×5): qty 1

## 2015-07-14 MED ORDER — NICOTINE 7 MG/24HR TD PT24
7.0000 mg | MEDICATED_PATCH | Freq: Every day | TRANSDERMAL | Status: DC
Start: 2015-07-14 — End: 2015-07-16
  Administered 2015-07-14 – 2015-07-16 (×3): 7 mg via TRANSDERMAL
  Filled 2015-07-14 (×3): qty 1

## 2015-07-14 MED ORDER — HYDROCODONE-ACETAMINOPHEN 10-325 MG PO TABS
1.0000 | ORAL_TABLET | ORAL | Status: DC | PRN
  Administered 2015-07-14: 1 via ORAL
  Filled 2015-07-14: qty 1

## 2015-07-14 MED ORDER — POTASSIUM CHLORIDE 10 MEQ/100ML IV SOLN
10.0000 meq | INTRAVENOUS | Status: AC
  Administered 2015-07-14: 10 meq via INTRAVENOUS
  Filled 2015-07-14 (×4): qty 100

## 2015-07-14 MED ORDER — HYDROCODONE-ACETAMINOPHEN 5-325 MG PO TABS
1.0000 | ORAL_TABLET | ORAL | Status: DC | PRN
  Administered 2015-07-14: 2 via ORAL
  Filled 2015-07-14: qty 2

## 2015-07-14 MED ORDER — QUETIAPINE FUMARATE 100 MG PO TABS
100.0000 mg | ORAL_TABLET | Freq: Every day | ORAL | Status: DC
  Administered 2015-07-14 – 2015-07-15 (×2): 100 mg via ORAL
  Filled 2015-07-14 (×2): qty 1

## 2015-07-14 MED ORDER — POTASSIUM CHLORIDE IN NACL 20-0.9 MEQ/L-% IV SOLN
INTRAVENOUS | Status: DC
  Administered 2015-07-14 – 2015-07-16 (×4): via INTRAVENOUS
  Filled 2015-07-14 (×7): qty 1000

## 2015-07-14 MED ORDER — PANTOPRAZOLE SODIUM 40 MG IV SOLR
40.0000 mg | Freq: Two times a day (BID) | INTRAVENOUS | Status: DC
  Administered 2015-07-14 – 2015-07-16 (×5): 40 mg via INTRAVENOUS
  Filled 2015-07-14 (×5): qty 40

## 2015-07-14 MED ORDER — SUCRALFATE 1 G PO TABS
1.0000 g | ORAL_TABLET | Freq: Three times a day (TID) | ORAL | Status: DC
  Administered 2015-07-14 – 2015-07-16 (×7): 1 g via ORAL
  Filled 2015-07-14 (×7): qty 1

## 2015-07-14 MED ORDER — CITALOPRAM HYDROBROMIDE 20 MG PO TABS
40.0000 mg | ORAL_TABLET | Freq: Every day | ORAL | Status: DC
  Administered 2015-07-14 – 2015-07-15 (×2): 40 mg via ORAL
  Filled 2015-07-14 (×2): qty 2

## 2015-07-14 NOTE — Progress Notes (Signed)
MD paged to make aware of high bp

## 2015-07-14 NOTE — Progress Notes (Signed)
Pt upset about pain medication being changed/ Dr. Elpidio Anis paged to make aware/ orders received

## 2015-07-14 NOTE — Progress Notes (Signed)
[facility] - [place] at [facility] - Inpatient   PATIENT NAME: Laurie Frederick    MR#:  [id number]  DATE OF BIRTH:  06-04-1976  SUBJECTIVE:  CHIEF COMPLAINT:   Chief Complaint  Patient presents with  . Abdominal Pain   still complains of nausea and vomiting. Nursing staff reports no vomiting today. No hematemesis or melena. No weight loss.  REVIEW OF SYSTEMS:    Review of Systems  Constitutional: Positive for malaise/fatigue. Negative for fever and chills.  HENT: Negative for sore throat.   Eyes: Negative for blurred vision, double vision and pain.  Respiratory: Negative for cough, hemoptysis, shortness of breath and wheezing.   Cardiovascular: Negative for chest pain, palpitations, orthopnea and leg swelling.  Gastrointestinal: Positive for nausea, vomiting and abdominal pain. Negative for heartburn, diarrhea and constipation.  Genitourinary: Negative for dysuria and hematuria.  Musculoskeletal: Negative for back pain and joint pain.  Skin: Negative for rash.  Neurological: Negative for sensory change, speech change, focal weakness and headaches.  Endo/Heme/Allergies: Does not bruise/bleed easily.  Psychiatric/Behavioral: Negative for depression. The patient is not nervous/anxious.       DRUG ALLERGIES:  No Known Allergies  VITALS:  Blood pressure 182/86, pulse 56, temperature 98.7 F (37.1 C), temperature source Oral, resp. rate 18, height 5\' 3"  (1.6 m), weight 78.019 kg (172 lb), last menstrual period 07/06/2015, SpO2 100 %.  PHYSICAL EXAMINATION:   Physical Exam  GENERAL:  39 y.o.-year-old patient lying in the bed with no acute distress.  EYES: Pupils equal, round, reactive to light and accommodation. No scleral icterus. Extraocular muscles intact.  HEENT: Head atraumatic, normocephalic. Oropharynx and nasopharynx clear.  NECK:  Supple, no jugular venous distention. No thyroid enlargement, no tenderness.  LUNGS: Normal breath sounds bilaterally, no  wheezing, rales, rhonchi. No use of accessory muscles of respiration.  CARDIOVASCULAR: S1, S2 normal. No murmurs, rubs, or gallops.  ABDOMEN: Soft, nontender, nondistended. Bowel sounds present. No organomegaly or mass.  EXTREMITIES: No cyanosis, clubbing or edema b/l.    NEUROLOGIC: Cranial nerves II through XII are intact. No focal Motor or sensory deficits b/l.   PSYCHIATRIC: The patient is alert and oriented x 3.  SKIN: No obvious rash, lesion, or ulcer.    LABORATORY PANEL:   CBC  Recent Labs Lab 07/13/15 0901  WBC 9.3  HGB 12.7  HCT 38.9  PLT 282   ------------------------------------------------------------------------------------------------------------------  Chemistries   Recent Labs Lab 07/14/15 0410  NA 141  K 3.0*  CL 105  CO2 28  GLUCOSE 99  BUN 8  CREATININE 0.61  CALCIUM 8.7*  AST 11*  ALT 11*  ALKPHOS 53  BILITOT 0.9   ------------------------------------------------------------------------------------------------------------------  Cardiac Enzymes No results for input(s): TROPONINI in the last 168 hours. ------------------------------------------------------------------------------------------------------------------  RADIOLOGY:  No results found.   ASSESSMENT AND PLAN:   * Gastritis is likely cause of intractable vomiting and abdominal pain. Recent CT scan of the abdomen and pelvis reviewed and showed nothing acute. Will add Bentyl and IV PPI. Along with scheduled IV Reglan. Limited use of IV narcotic medications. Discussed this with patient.  * Dehydration and hypokalemia Replace orally and IV  * Elevated blood pressure without diagnosis of hypertension Consistently elevated blood pressure during the hospital stay. Could be due to abdominal pain. Will use IV when necessary medications at this time. If consistently elevated over the next 24 hours though add oral medications.   All the records are reviewed and case discussed with  Care Management/Social Workerr. Management plans  discussed with the patient, family and they are in agreement.  CODE STATUS: FULL  DVT Prophylaxis: SCDs  TOTAL TIME TAKING CARE OF THIS PATIENT: 37 minutes.   POSSIBLE D/C IN 1-2 DAYS, DEPENDING ON CLINICAL CONDITION.   Milagros Loll R M.D on 07/14/2015 at 1:29 PM  Between 7am to 6pm - Pager - [phone]  After 6pm go to [email or web address] - password [email or web address]  [facility]  Office  [phone]  CC: Primary care physician; Baruch Gouty, MD

## 2015-07-14 NOTE — Progress Notes (Signed)
Initial Nutrition Assessment   INTERVENTION:   Meals and Snacks: Cater to patient preferences Medical Food Supplement Therapy: will recommend Boost Breeze po TID, each supplement provides 250 kcal and 9 grams of protein, on follow if intake poor and/or remains on CL diet order   NUTRITION DIAGNOSIS:   Inadequate oral intake related to altered GI function as evidenced by  (CL diet order, nausea).  GOAL:   Patient will meet greater than or equal to 90% of their needs  MONITOR:    (Energy Intake, digestive system, glucose Profile, Electrolyte and renal Profile, Anthropometrics)  REASON FOR ASSESSMENT:   Malnutrition Screening Tool    ASSESSMENT:   Pt admitted with n/v secondary to gastritis or cyclic vomitting syndrome. Pt with recent discharge from [facility] with 1 week stay with intractible n/v; EGD performed consistent with gastritis per MD note.  Past Medical History  Diagnosis Date  . Sleep apnea   . GERD (gastroesophageal reflux disease)   . Hyperlipidemia   . Depression   . Anxiety   . PTSD (post-traumatic stress disorder)   . Obesity   . Anemia   . Vitamin D deficiency   . Nontoxic uninodular goiter     Diet Order:  Diet clear liquid Room service appropriate?: Yes; Fluid consistency:: Thin    Current Nutrition: Pt taking sips of liquids on visit.  Food/Nutrition-Related History: Pt reports after being discharged this past Saturday she ate hot dog and hamburger. Pt states 'I may have over did it.' Pt reports n/v started and has been able to tolerate very little since Saturday.   Medications: NS with KCl at 63mL/hr, carafate, KCl, reglan,   Electrolyte/Renal Profile and Glucose Profile:   Recent Labs Lab 07/13/15 0901 07/14/15 0410  NA 142 141  K 3.2* 3.0*  CL 103 105  CO2 26 28  BUN 7 8  CREATININE 0.70 0.61  CALCIUM 9.6 8.7*  GLUCOSE 113* 99   Protein Profile:  Recent Labs Lab 07/13/15 0901 07/14/15 0410  ALBUMIN 4.5 3.7     Gastrointestinal Profile: Last BM: 07/09/2015 in [facility], no emesis today per Nsg   Nutrition-Focused Physical Exam Findings: Nutrition-Focused physical exam completed. Findings are WDL for fat depletion, muscle depletion, and edema.    Weight Change: Pt reports weight of 210lbs back in February 2016 and weight of 189lbs 4 months ago (18% weight loss in 4 months)   Height:   Ht Readings from Last 1 Encounters:  07/13/15  (1.6 m)    Weight:   Wt Readings from Last 1 Encounters:  07/13/15 172 lb (78.019 kg)     BMI:  Body mass index is 30.48 kg/(m^2).  Estimated Nutritional Needs:   Kcal:  BEE: 1177kcals, TEE: (IF 1.1-1.3)(AF 1.2) 1553-1836kcals   Protein:  42-52g protein (0.8-1.0g/kg)  Fluid:  1308-1517mL of fluid (25-67mL/kg)  EDUCATION NEEDS:   Education needs no appropriate at this time   MODERATE Care Level  Leda Quail, RD, LDN Pager [phone]

## 2015-07-15 LAB — BASIC METABOLIC PANEL
ANION GAP: 6 (ref 5–15)
BUN: 6 mg/dL (ref 6–20)
CHLORIDE: 108 mmol/L (ref 101–111)
CO2: 27 mmol/L (ref 22–32)
CREATININE: 0.84 mg/dL (ref 0.44–1.00)
Calcium: 8.4 mg/dL — ABNORMAL LOW (ref 8.9–10.3)
GFR calc non Af Amer: >60 mL/min (ref >60)
Glucose, Bld: 103 mg/dL — ABNORMAL HIGH (ref 65–99)
POTASSIUM: 2.9 mmol/L — AB (ref 3.5–5.1)
SODIUM: 141 mmol/L (ref 135–145)

## 2015-07-15 LAB — MAGNESIUM: Magnesium: 1.7 mg/dL (ref 1.7–2.4)

## 2015-07-15 MED ORDER — PANTOPRAZOLE SODIUM 40 MG PO TBEC: 40.0000 mg | DELAYED_RELEASE_TABLET | Freq: Two times a day (BID) | ORAL | Status: DC

## 2015-07-15 MED ORDER — SENNA 8.6 MG PO TABS
2.0000 | ORAL_TABLET | Freq: Every day | ORAL | Status: DC
  Administered 2015-07-15 – 2015-07-16 (×2): 17.2 mg via ORAL
  Filled 2015-07-15 (×2): qty 2

## 2015-07-15 MED ORDER — CLARITHROMYCIN 500 MG PO TABS
500.0000 mg | ORAL_TABLET | Freq: Two times a day (BID) | ORAL | Status: DC
Start: 2015-07-15 — End: 2015-09-03

## 2015-07-15 MED ORDER — POTASSIUM CHLORIDE CRYS ER 20 MEQ PO TBCR
40.0000 meq | EXTENDED_RELEASE_TABLET | ORAL | Status: AC
  Administered 2015-07-15 (×2): 40 meq via ORAL
  Filled 2015-07-15 (×2): qty 2

## 2015-07-15 MED ORDER — POTASSIUM CHLORIDE CRYS ER 20 MEQ PO TBCR
20.0000 meq | EXTENDED_RELEASE_TABLET | Freq: Once | ORAL | Status: AC
  Administered 2015-07-15: 20 meq via ORAL
  Filled 2015-07-15: qty 1

## 2015-07-15 MED ORDER — POTASSIUM CHLORIDE 10 MEQ/100ML IV SOLN
10.0000 meq | INTRAVENOUS | Status: DC
  Filled 2015-07-15 (×4): qty 100

## 2015-07-15 MED ORDER — CLARITHROMYCIN 500 MG PO TABS
500.0000 mg | ORAL_TABLET | Freq: Two times a day (BID) | ORAL | Status: DC
  Administered 2015-07-15 – 2015-07-16 (×3): 500 mg via ORAL
  Filled 2015-07-15 (×3): qty 1

## 2015-07-15 MED ORDER — AMOXICILLIN 500 MG PO CAPS
1000.0000 mg | ORAL_CAPSULE | Freq: Two times a day (BID) | ORAL | Status: DC
  Administered 2015-07-15 – 2015-07-16 (×3): 1000 mg via ORAL
  Filled 2015-07-15 (×3): qty 2

## 2015-07-15 MED ORDER — AMOXICILLIN 500 MG PO CAPS: 1000.0000 mg | ORAL_CAPSULE | Freq: Two times a day (BID) | ORAL | Status: DC

## 2015-07-15 NOTE — Plan of Care (Signed)
Problem: Phase I Progression Outcomes Goal: Pain controlled with appropriate interventions Outcome: Completed/Met Date Met:  07/15/15 Pain control with oral pain medication Goal: OOB as tolerated unless otherwise ordered Outcome: Progressing Ambulating to the bathroom. Goal: Voiding-avoid urinary catheter unless indicated Outcome: Completed/Met Date Met:  07/15/15 No difficulty voiding.

## 2015-07-15 NOTE — Consult Note (Signed)
  Pt seen and examined. Please see C. Peggye Pitt' notes. Pt recently hospitalized for nausea, vomiting, and epigastric pain. Found to have significant gastritis by Dr. Mechele Collin. Unfortunately, pt could not keep her meds or foods down after discharge. Also, found to have UTI and H.pylori as well. Nausea has improved but continues to have epigastric/restrosternal pain.  Recommend PPI BID. Treat UTI, which probably contributed to overall GI symptoms. Once, pt is able to maintain PO, then can start treatment for H.pylori x 10 days. Will follow. thanks

## 2015-07-15 NOTE — Progress Notes (Signed)
[facility] - [place] at [facility]   PATIENT NAME: Laurie Frederick    MR#:  [id number]  DATE OF BIRTH:  May 09, 1976  SUBJECTIVE:  CHIEF COMPLAINT:   Chief Complaint  Patient presents with  . Abdominal Pain  Still complains of nausea and vomiting. Nursing staff reports no vomiting today. No hematemesis or melena. No weight loss.  REVIEW OF SYSTEMS:    Review of Systems  Constitutional: Positive for malaise/fatigue. Negative for fever and chills.  HENT: Negative for sore throat.   Eyes: Negative for blurred vision, double vision and pain.  Respiratory: Negative for cough, hemoptysis, shortness of breath and wheezing.   Cardiovascular: Negative for chest pain, palpitations, orthopnea and leg swelling.  Gastrointestinal: Positive for nausea, vomiting and abdominal pain. Negative for heartburn, diarrhea and constipation.  Genitourinary: Negative for dysuria and hematuria.  Musculoskeletal: Negative for back pain and joint pain.  Skin: Negative for rash.  Neurological: Negative for sensory change, speech change, focal weakness and headaches.  Endo/Heme/Allergies: Does not bruise/bleed easily.  Psychiatric/Behavioral: Negative for depression. The patient is not nervous/anxious.       DRUG ALLERGIES:  No Known Allergies  VITALS:  Blood pressure 112/67, pulse 64, temperature 98.1 F (36.7 C), temperature source Oral, resp. rate 16, height  (1.6 m), weight 78.019 kg (172 lb), last menstrual period 07/06/2015, SpO2 100 %.  PHYSICAL EXAMINATION:   Physical Exam  GENERAL:  39 y.o.-year-old patient lying in the bed with no acute distress.  EYES: Pupils equal, round, reactive to light and accommodation. No scleral icterus. Extraocular muscles intact.  HEENT: Head atraumatic, normocephalic. Oropharynx and nasopharynx clear.  NECK:  Supple, no jugular venous distention. No thyroid enlargement, no tenderness.  LUNGS: Normal breath sounds bilaterally, no  wheezing, rales, rhonchi. No use of accessory muscles of respiration.  CARDIOVASCULAR: S1, S2 normal. No murmurs, rubs, or gallops.  ABDOMEN: Soft, nontender, nondistended. Bowel sounds present. No organomegaly or mass.  EXTREMITIES: No cyanosis, clubbing or edema b/l.    NEUROLOGIC: Cranial nerves II through XII are intact. No focal Motor or sensory deficits b/l.   PSYCHIATRIC: The patient is alert and oriented x 3.  SKIN: No obvious rash, lesion, or ulcer.    LABORATORY PANEL:   CBC  Recent Labs Lab 07/13/15 0901  WBC 9.3  HGB 12.7  HCT 38.9  PLT 282   ------------------------------------------------------------------------------------------------------------------  Chemistries   Recent Labs Lab 07/14/15 0410 07/15/15 0516  NA 141 141  K 3.0* 2.9*  CL 105 108  CO2 28 27  GLUCOSE 99 103*  BUN 8 6  CREATININE 0.61 0.84  CALCIUM 8.7* 8.4*  MG  --  1.7  AST 11*  --   ALT 11*  --   ALKPHOS 53  --   BILITOT 0.9  --    ------------------------------------------------------------------------------------------------------------------  Cardiac Enzymes No results for input(s): TROPONINI in the last 168 hours. ------------------------------------------------------------------------------------------------------------------  RADIOLOGY:  No results found.   ASSESSMENT AND PLAN:   * Gastritis is likely cause of intractable vomiting and abdominal pain. Recent CT scan of the abdomen and pelvis reviewed and showed nothing acute. H. Pylori positive on biopsy from recent EGD Start Amoxicillin and clarithromycin with BID PPI.  * Dehydration and hypokalemia Replace orally and IV  * Elevated blood pressure without diagnosis of hypertension Consistently elevated blood pressure during the hospital stay. Could be due to abdominal pain. Will use IV when necessary medications at this time. If consistently elevated over the next 24 hours  though add oral medications.   All  the records are reviewed and case discussed with Care Management/Social Workerr. Management plans discussed with the patient, family and they are in agreement.  CODE STATUS: FULL  DVT Prophylaxis: SCDs  TOTAL TIME TAKING CARE OF THIS PATIENT: 35 minutes.   POSSIBLE D/C IN 1-2 DAYS, DEPENDING ON CLINICAL CONDITION.   Milagros Loll R M.D on 07/15/2015 at 2:16 PM  Between 7am to 6pm - Pager - [phone]  After 6pm go to [email or web address] - password [email or web address] [facility]  [facility]  Office  [phone]  CC: Primary care physician; Baruch Gouty, MD

## 2015-07-15 NOTE — Progress Notes (Signed)
Spoke with Dr. Betti Cruz about Potassium level.  MD order 20 mEq of potassium once.

## 2015-07-15 NOTE — Consult Note (Signed)
GI Inpatient Consult Note  Reason for Consult:  Abdominal pain and vomiting   Attending Requesting Consult:  Dr. Elpidio Anis  History of Present Illness: Laurie Frederick is a 39 y.o. female who was recently admitted at [facility] From September 5 through July 09, 2015.   Her hospital course: 39 y.o. female with a known history of depression, was admitted with severe epigastric abd pain and vomiting. Please see Dr. Jim Desanctis dictated history and physical for further details. GI consultation was obtained with Dr. Mechele Collin recommended EGD which was performed on seventh of September showing gastritis typical for severe vomiting causing traumatic gastritis to the lesser curvature upper body of the stomach. Biopsy were done.   Patient was started on clear liquids and advanced to full liquids which she tolerated it. He was also started on carafate slurry qid and IV PPI. She was instructed not to use potassium tablets or any other potentially caustic medication.   Her pain was improved quite a bit. Did not have any further vomiting and she tolerated soft diet today without much difficulty, although she did have some pain for which she was requesting narcotics. I have given 5 tablets of Dilaudid for extreme breakthrough pain. Otherwise, I have instructed her to use was extra strength Tylenol and some tramadol as needed. She is agreeable with the discharge plan and is discharged home in stable condition.   I participated in her care during her hospitalization.  The HPI from the consult note:  Laurie Frederick is a 39 y.o. female who reports she started having severe vomiting Sunday night. She reports she started vomiting undigested food within minutes of eating dinner, which was spaghetti and Pasta salad. She reports that no one else who ate his supper became ill. She continued to have episodes of emesis and then started vomiting up yellow and decided to go to emergency department.  She reports she started vomiting "slimy white mucus" this morning and remains nauseous.  She reports severe epigastric pain, 10/10, on controlled with pain medications. She reports some morphine will help a little, but the pain comes right back.  She reports that she has heartburn and acid reflux on a fairly frequent basis, mostly if she eats late at night. She denies taking anything to help relieve this.  She denies any changes in her bowel movements. She She reports her normal bowel movements are loose.  She denies NSAID use, will use Tylenol occasionally.  She was last seen at [facility] in the GI department on February 26, 2014. At that time she had been having rectal bleeding as well as abdominal pain and cramping. At that time she reported her pain was localized to the right upper quadrant area. An ultrasound showed a 7.5 mm gallbladder wall polyp or stone. She was supposed to see Dr. Renda Rolls on February 23, 2014 that did not make the appointment. The plan at that time was for patient to use Anusol suppositories to help with the rectal bleeding, and a prescription for Bentyl 10 mg to help with abdominal cramping and pain which was probably related to her IBS.  During this workup she also had an MRI that showed hypoechoic lesions within the liver, the largest measures 2.4 cm. The MRI showed the dominant lesion inferiorly in the right hepatic lobe adjacent to the right kidney demonstrates enhancement characteristics most consistent with focal nodular hyperplasia although is not entirely specific. Follow-up with MRI in 6 months recommended to evaluate stability.   She had  an upper endoscopy completed on February 09, 2014. Dr. Riki Altes. Indications; generalized abdominal pain, Suspected esophageal reflux. Findings; normal esophagus. Normal stomach. Normal examined duodenum. Recommendations ; use omeprazole 20 mg p.o. Daily.  She has also had a colonoscopy on February 09, 2014. Dr. Bluford Kaufmann.  Indications; generalized abdominal pain, rectal bleeding. Impression ; the entire examined colon was normal. Probably has IBS.  Of note, Patient was noted on last office visit to have A history of depression, anxiety which was contributing to her overall symptoms at that time. She also had an overall fear of cancer. She did talk more about cancer today during our visit and reports losing several relatives recently to different types of cancers.  Patient was scheduled for small bowel series to be completed in July, 2015, this did not occur. The patient did not follow up in our office after that visit.  During her stay she had an upper endoscopy with Dr. Mechele Collin.  The pathology from that upper endoscopy:  Surgical Pathology  CASE: [id number]  PATIENT: Laurie Frederick  Surgical Pathology Report      SPECIMEN SUBMITTED:  A. Stomach, severe gastritis body, cbx   CLINICAL HISTORY:  None provided   PRE-OPERATIVE DIAGNOSIS:  Epigastric pain, vomiting   POST-OPERATIVE DIAGNOSIS:  Severe gastritis   DIAGNOSIS:  A. STOMACH, BODY; COLD BIOPSY:  - HELICOBACTER PYLORI-ASSOCIATED GASTRITIS, WITH MILD CHRONIC ACTIVE  INFLAMMATION.  - NEGATIVE FOR INTESTINAL METAPLASIA, ATROPHY, DYSPLASIA, AND  MALIGNANCY.  - H. PYLORI IDENTIFIED BY IMMUNOHISTOCHEMISTRY.   Comment:  Immunohistochemistry (IHC) was performed because there is active  gastritis, and no H pylori were identified in the routine hematoxylin  and eosin sections. IHC demonstrates a moderate number of H pylori  organisms localized to the gastric pits and glands. This is a variant  pattern of involvement in which the organisms are extremely difficult to  identify without IHC. All controls stained appropriately.   GROSS DESCRIPTION:  A. Labeled: C biopsy severe gastritis body  Tissue Fragment(s): 2  Measurement: 0.4 and 0.6 cm  Comment: Tan   Entirely submitted in cassette(s): 1   Final Diagnosis performed by Ronald Lobo, MD. Electronically signed  07/11/2015 4:04:42PM   Since she has been home she reports that she has been trying to take the medication as directed, however, unfortunately she is unable to keep anything down.  She reports she will throw up the medication within 5 minutes of taking.  She is still experiencing epigastric abdominal pain which she describes as deep burning.  She reports the rest of her abdomen is not as painful as it was during her first admission.  She reports taking the pantoprazole once a day as directed.  She denies NSAID use.  She had not been started on any type of treatment for the H pylori.  Her follow-up appointment and [facility] in the GI department was scheduled for August 02, 2015.  She reported to the emergency department at [facility] on July 12, 2015 with the same complaints as today.  They did discover a urinary tract infection.  Gave her a prescription for Cipro 500 mg twice a day, and Phenergan suppositories to help control her nausea.    Past Medical History:  Past Medical History  Diagnosis Date  . Sleep apnea   . GERD (gastroesophageal reflux disease)   . Hyperlipidemia   . Depression   . Anxiety   . PTSD (post-traumatic stress disorder)   . Obesity   . Anemia   . Vitamin  D deficiency   . Nontoxic uninodular goiter     Problem List: Patient Active Problem List   Diagnosis Date Noted  . Anemia 07/13/2015  . Hyperlipidemia 07/13/2015  . Vitamin D deficiency 07/13/2015  . Thyroid nodule 07/13/2015  . Polyp of gallbladder 07/13/2015  . Liver nodule 07/13/2015  . Nausea & vomiting 07/13/2015    Past Surgical History: Past Surgical History  Procedure Laterality Date  . Cesarean section      Allergies: No Known Allergies  Home Medications: Prescriptions prior to admission  Medication Sig Dispense Refill Last Dose  . alprazolam (XANAX) 2 MG tablet Take 2 mg by mouth 2 (two) times daily.   07/12/2015 at Unknown time  .  ciprofloxacin (CIPRO) 500 MG tablet Take 500 mg by mouth 2 (two) times daily.   07/12/2015 at Unknown time  . citalopram (CELEXA) 20 MG tablet Take 40 mg by mouth at bedtime.   07/12/2015 at Unknown time  . HYDROcodone-acetaminophen (NORCO/VICODIN) 5-325 MG per tablet Take 1 tablet by mouth every 6 (six) hours as needed for moderate pain.   07/13/2015 at 0900  . pantoprazole (PROTONIX) 40 MG tablet Take 40 mg by mouth daily.   07/12/2015 at Unknown time  . promethazine (PHENERGAN) 25 MG suppository Place 25 mg rectally every 6 (six) hours as needed for nausea or vomiting.   07/13/2015 at Unknown time  . QUEtiapine (SEROQUEL) 50 MG tablet Take 100 mg by mouth at bedtime.    07/12/2015 at Unknown time  . sucralfate (CARAFATE) 1 G tablet Take 1 g by mouth 4 (four) times daily -  with meals and at bedtime.   07/12/2015 at Unknown time  . zolpidem (AMBIEN) 10 MG tablet Take 10 mg by mouth at bedtime.   07/12/2015 at Unknown time   Home medication reconciliation was completed with the patient.   Scheduled Inpatient Medications:   . alprazolam  2 mg Oral BID  . citalopram  40 mg Oral QHS  . dicyclomine  20 mg Oral TID AC  . enoxaparin (LOVENOX) injection  40 mg Subcutaneous Q24H  . metoCLOPramide (REGLAN) injection  5 mg Intravenous TID AC & HS  . nicotine  7 mg Transdermal Daily  . pantoprazole (PROTONIX) IV  40 mg Intravenous Q12H  . potassium chloride  40 mEq Oral Q4H  . QUEtiapine  100 mg Oral QHS  . senna  2 tablet Oral Daily  . sucralfate  1 g Oral TID WC & HS    Continuous Inpatient Infusions:   . 0.9 % NaCl with KCl 20 mEq / L 75 mL/hr at 07/14/15 2251    PRN Inpatient Medications:  acetaminophen **OR** acetaminophen, ALPRAZolam, alum & mag hydroxide-simeth, hydrALAZINE, HYDROcodone-acetaminophen, ondansetron **OR** ondansetron (ZOFRAN) IV, senna-docusate, zolpidem  Family History: family history includes Alcohol abuse in her father; Anxiety disorder in her mother; Arthritis in her  mother; Cancer in her father; Depression in her mother; Diabetes in her paternal grandmother; Heart disease in her paternal grandmother; Hyperlipidemia in her mother; Hypertension in her maternal grandmother and paternal grandmother.    Social History:   reports that she has been smoking.  She does not have any smokeless tobacco history on file. She reports that she does not drink alcohol or use illicit drugs.   Review of Systems: Constitutional: Weight is stable.  Eyes: No changes in vision. ENT: No oral lesions, sore throat.  GI: see HPI.  Heme/Lymph: No easy bruising.  CV: No chest pain.  GU: No hematuria.  Integumentary: No rashes.  Neuro: No headaches.  Psych: No depression/anxiety.  Endocrine: No heat/cold intolerance.  Allergic/Immunologic: No urticaria.  Resp: No cough, SOB.  Musculoskeletal: No joint swelling.    Physical Examination: BP 112/67 mmHg  Pulse 64  Temp(Src) 98.1 F (36.7 C) (Oral)  Resp 16  Ht 5\' 3"  (1.6 m)  Wt 78.019 kg (172 lb)  BMI 30.48 kg/m2  SpO2 100%  LMP 07/06/2015 (Approximate) Gen: alert and oriented x 4, in mild distress HEENT: PEERLA, EOMI, Neck: supple, no JVD or thyromegaly Chest: CTA bilaterally, no wheezes, crackles, or other adventitious sounds CV: RRR, no m/g/c/r Abd: soft, epigastric tenderness, ND, +BS in all four quadrants; no HSM, guarding, ridigity, or rebound tenderness Ext: no edema, well perfused with 2+ pulses, Skin: no rash or lesions noted Lymph: no LAD  Data: Lab Results  Component Value Date   WBC 9.3 07/13/2015   HGB 12.7 07/13/2015   HCT 38.9 07/13/2015   MCV 89.5 07/13/2015   PLT 282 07/13/2015    Recent Labs Lab 07/13/15 0901  HGB 12.7   Lab Results  Component Value Date   NA 141 07/15/2015   K 2.9* 07/15/2015   CL 108 07/15/2015   CO2 27 07/15/2015   BUN 6 07/15/2015   CREATININE 0.84 07/15/2015   Lab Results  Component Value Date   ALT 11* 07/14/2015   AST 11* 07/14/2015   ALKPHOS 53  07/14/2015   BILITOT 0.9 07/14/2015   No results for input(s): APTT, INR, PTT in the last 168 hours. Assessment/Plan: Ms. Stay is a 39 y.o. female with severe gastritis with h pylori infection from biopsy, vomiting and epigastric pain.  She has been taking protonix times once daily, but has been unable to keep any pills down due to vomiting.  She also has a UTI for which she started treatment of Cipro on the 07/12/2015.  Recommendations: We recommend that she be started on appropriate treatment of H pylori as soon as she is able to tolerate keeping clear liquids and pills down.  Amoxicillin  1 g twice a day and clarithromycin 500 mg twice a day for 14 days along with continuing the Protonix 40 mg twice a day.  She has an appointment at [facility] in the GI department on August 02, 2015 with Fransico Setters, NP, she does need to keep this follow-up appointment.  We recommend continuous anti emetics to help her with her nausea and vomiting.  We recommend only  IV potassium be given.    We will continue to follow with you.  Thank you for the consult. Please call with questions or concerns.  Carney Harder, PA-C  I personally performed these services.

## 2015-07-15 NOTE — Discharge Instructions (Signed)
°  DIET:  Regular diet  DISCHARGE CONDITION:  Stable  ACTIVITY:  Activity as tolerated  OXYGEN:  Home Oxygen: No.   Oxygen Delivery: room air  DISCHARGE LOCATION:  home   If you experience worsening of your admission symptoms, develop shortness of breath, life threatening emergency, suicidal or homicidal thoughts you must seek medical attention immediately by calling 911 or calling your MD immediately  if symptoms less severe.  You Must read complete instructions/literature along with all the possible adverse reactions/side effects for all the Medicines you take and that have been prescribed to you. Take any new Medicines after you have completely understood and accpet all the possible adverse reactions/side effects.   Please note  You were cared for by a hospitalist during your hospital stay. If you have any questions about your discharge medications or the care you received while you were in the hospital after you are discharged, you can call the unit and asked to speak with the hospitalist on call if the hospitalist that took care of you is not available. Once you are discharged, your primary care physician will handle any further medical issues. Please note that NO REFILLS for any discharge medications will be authorized once you are discharged, as it is imperative that you return to your primary care physician (or establish a relationship with a primary care physician if you do not have one) for your aftercare needs so that they can reassess your need for medications and monitor your lab values.  Small amounts of liquids and soft diet till full diet is tolerated, Avoid Alcohol, NSAIDs, Tobacco.

## 2015-07-16 DIAGNOSIS — K2970 Gastritis, unspecified, without bleeding: Secondary | ICD-10-CM | POA: Diagnosis present

## 2015-07-16 DIAGNOSIS — B9681 Helicobacter pylori [H. pylori] as the cause of diseases classified elsewhere: Secondary | ICD-10-CM | POA: Diagnosis present

## 2015-07-16 DIAGNOSIS — K2990 Gastroduodenitis, unspecified, without bleeding: Secondary | ICD-10-CM

## 2015-07-16 LAB — BASIC METABOLIC PANEL
ANION GAP: 5 (ref 5–15)
BUN: 6 mg/dL (ref 6–20)
CALCIUM: 8 mg/dL — AB (ref 8.9–10.3)
CO2: 26 mmol/L (ref 22–32)
Chloride: 110 mmol/L (ref 101–111)
Creatinine, Ser: 0.82 mg/dL (ref 0.44–1.00)
GFR calc Af Amer: >60 mL/min (ref >60)
GLUCOSE: 90 mg/dL (ref 65–99)
Potassium: 3.9 mmol/L (ref 3.5–5.1)
SODIUM: 141 mmol/L (ref 135–145)

## 2015-07-16 MED ORDER — PANTOPRAZOLE SODIUM 40 MG PO TBEC: 40.0000 mg | DELAYED_RELEASE_TABLET | Freq: Two times a day (BID) | ORAL | Status: DC

## 2015-07-16 MED ORDER — NICOTINE 7 MG/24HR TD PT24: 7.0000 mg | MEDICATED_PATCH | Freq: Every day | TRANSDERMAL | Status: DC

## 2015-07-16 MED ORDER — HYDROCODONE-ACETAMINOPHEN 5-325 MG PO TABS: 1.0000 | ORAL_TABLET | Freq: Four times a day (QID) | ORAL | Status: DC | PRN

## 2015-07-16 NOTE — Progress Notes (Signed)
P&[employer] approved IV to PO change policy Description contains the criteria that are approved  PHARMACIST - PHYSICIAN COMMUNICATION CONCERNING: IV to Oral Route Change Policy  RECOMMENDATION: This patient is receiving pantoprazole by the intravenous route.  Based on criteria approved by the Pharmacy and Therapeutics Committee, the intravenous medication(s) is/are being converted to the equivalent oral dose form(s).   DESCRIPTION: These criteria include:  The patient is eating (either orally or via tube) and/or has been taking other orally administered medications for a least 24 hours  The patient has no evidence of active gastrointestinal bleeding or impaired GI absorption (gastrectomy, short bowel, patient on TNA or NPO).  If you have questions about this conversion, please contact the Pharmacy Department    [phone] )  [facility]   [phone] )  [facility]   [phone] )  [facility]   [phone] )  [facility]   [phone] )  [facility]   Cy Blamer, [facility] 07/16/2015 9:01 AM

## 2015-07-16 NOTE — Progress Notes (Signed)
Patient discharged home. IV removed. Instructions and prescriptions given to patient, verbalized understanding. Waiting on family to provide transportation.

## 2015-07-19 ENCOUNTER — Encounter: Payer: Self-pay | Admitting: Emergency Medicine

## 2015-07-21 ENCOUNTER — Inpatient Hospital Stay
Admission: EM | Admit: 2015-07-21 | Discharge: 2015-07-26 | DRG: 392 | Disposition: A | Discharge status: Home / Self Care | Payer: Medicaid Other | Attending: Internal Medicine | Admitting: Internal Medicine

## 2015-07-21 ENCOUNTER — Encounter: Payer: Self-pay | Admitting: Urgent Care

## 2015-07-21 DIAGNOSIS — E876 Hypokalemia: Secondary | ICD-10-CM | POA: Diagnosis present

## 2015-07-21 DIAGNOSIS — K2960 Other gastritis without bleeding: Principal | ICD-10-CM | POA: Diagnosis present

## 2015-07-21 DIAGNOSIS — R112 Nausea with vomiting, unspecified: Secondary | ICD-10-CM | POA: Diagnosis present

## 2015-07-21 DIAGNOSIS — E041 Nontoxic single thyroid nodule: Secondary | ICD-10-CM | POA: Diagnosis present

## 2015-07-21 DIAGNOSIS — K219 Gastro-esophageal reflux disease without esophagitis: Secondary | ICD-10-CM | POA: Diagnosis present

## 2015-07-21 DIAGNOSIS — R634 Abnormal weight loss: Secondary | ICD-10-CM

## 2015-07-21 DIAGNOSIS — R1013 Epigastric pain: Secondary | ICD-10-CM | POA: Insufficient documentation

## 2015-07-21 DIAGNOSIS — F419 Anxiety disorder, unspecified: Secondary | ICD-10-CM | POA: Diagnosis present

## 2015-07-21 DIAGNOSIS — K2970 Gastritis, unspecified, without bleeding: Secondary | ICD-10-CM

## 2015-07-21 DIAGNOSIS — E669 Obesity, unspecified: Secondary | ICD-10-CM | POA: Diagnosis present

## 2015-07-21 DIAGNOSIS — R1110 Vomiting, unspecified: Secondary | ICD-10-CM

## 2015-07-21 DIAGNOSIS — Z79891 Long term (current) use of opiate analgesic: Secondary | ICD-10-CM

## 2015-07-21 DIAGNOSIS — F17210 Nicotine dependence, cigarettes, uncomplicated: Secondary | ICD-10-CM | POA: Diagnosis present

## 2015-07-21 DIAGNOSIS — Z6831 Body mass index (BMI) 31.0-31.9, adult: Secondary | ICD-10-CM

## 2015-07-21 DIAGNOSIS — G4730 Sleep apnea, unspecified: Secondary | ICD-10-CM | POA: Diagnosis present

## 2015-07-21 DIAGNOSIS — E785 Hyperlipidemia, unspecified: Secondary | ICD-10-CM | POA: Diagnosis present

## 2015-07-21 DIAGNOSIS — B9681 Helicobacter pylori [H. pylori] as the cause of diseases classified elsewhere: Secondary | ICD-10-CM

## 2015-07-21 DIAGNOSIS — E559 Vitamin D deficiency, unspecified: Secondary | ICD-10-CM | POA: Diagnosis present

## 2015-07-21 DIAGNOSIS — F4310 Post-traumatic stress disorder, unspecified: Secondary | ICD-10-CM | POA: Diagnosis present

## 2015-07-21 DIAGNOSIS — K769 Liver disease, unspecified: Secondary | ICD-10-CM | POA: Diagnosis present

## 2015-07-21 DIAGNOSIS — Z79899 Other long term (current) drug therapy: Secondary | ICD-10-CM

## 2015-07-21 DIAGNOSIS — F329 Major depressive disorder, single episode, unspecified: Secondary | ICD-10-CM | POA: Diagnosis present

## 2015-07-21 LAB — CBC
HEMATOCRIT: 40.4 % (ref 35.0–47.0)
HEMOGLOBIN: 12.9 g/dL (ref 12.0–16.0)
MCH: 29 pg (ref 26.0–34.0)
MCHC: 32 g/dL (ref 32.0–36.0)
MCV: 90.6 fL (ref 80.0–100.0)
Platelets: 273 10*3/uL (ref 150–440)
RBC: 4.46 MIL/uL (ref 3.80–5.20)
RDW: 14.8 % — ABNORMAL HIGH (ref 11.5–14.5)
WBC: 7.8 10*3/uL (ref 3.6–11.0)

## 2015-07-21 LAB — COMPREHENSIVE METABOLIC PANEL
ALBUMIN: 4.3 g/dL (ref 3.5–5.0)
ALK PHOS: 67 U/L (ref 38–126)
ALT: 19 U/L (ref 14–54)
ANION GAP: 9 (ref 5–15)
AST: 25 U/L (ref 15–41)
BUN: <5 mg/dL — ABNORMAL LOW (ref 6–20)
CALCIUM: 9.4 mg/dL (ref 8.9–10.3)
CO2: 24 mmol/L (ref 22–32)
Chloride: 109 mmol/L (ref 101–111)
Creatinine, Ser: 0.72 mg/dL (ref 0.44–1.00)
GFR calc non Af Amer: >60 mL/min (ref >60)
Glucose, Bld: 107 mg/dL — ABNORMAL HIGH (ref 65–99)
POTASSIUM: 3.7 mmol/L (ref 3.5–5.1)
SODIUM: 142 mmol/L (ref 135–145)
Total Bilirubin: 0.6 mg/dL (ref 0.3–1.2)
Total Protein: 7.6 g/dL (ref 6.5–8.1)

## 2015-07-21 LAB — LIPASE, BLOOD: LIPASE: 26 U/L (ref 22–51)

## 2015-07-21 MED ORDER — HALOPERIDOL LACTATE 5 MG/ML IJ SOLN
5.0000 mg | Freq: Once | INTRAMUSCULAR | Status: AC
  Administered 2015-07-21: 5 mg via INTRAVENOUS
  Filled 2015-07-21: qty 1

## 2015-07-21 MED ORDER — ONDANSETRON HCL 4 MG/2ML IJ SOLN
4.0000 mg | Freq: Once | INTRAMUSCULAR | Status: AC
  Administered 2015-07-21: 4 mg via INTRAVENOUS

## 2015-07-21 MED ORDER — DEXTROSE 5 % AND 0.9 % NACL IV BOLUS
1000.0000 mL | Freq: Once | INTRAVENOUS | Status: AC
  Administered 2015-07-21: 1000 mL via INTRAVENOUS

## 2015-07-21 MED ORDER — FAMOTIDINE IN NACL 20-0.9 MG/50ML-% IV SOLN
20.0000 mg | Freq: Once | INTRAVENOUS | Status: AC
  Administered 2015-07-21: 20 mg via INTRAVENOUS
  Filled 2015-07-21: qty 50

## 2015-07-21 MED ORDER — DIPHENHYDRAMINE HCL 50 MG/ML IJ SOLN
50.0000 mg | Freq: Once | INTRAMUSCULAR | Status: AC
  Administered 2015-07-21: 50 mg via INTRAVENOUS
  Filled 2015-07-21: qty 1

## 2015-07-21 MED ORDER — ONDANSETRON HCL 4 MG/2ML IJ SOLN
INTRAMUSCULAR | Status: AC
  Administered 2015-07-21: 4 mg via INTRAVENOUS
  Filled 2015-07-21: qty 2

## 2015-07-21 MED ORDER — PROMETHAZINE HCL 25 MG/ML IJ SOLN
25.0000 mg | Freq: Once | INTRAMUSCULAR | Status: AC
  Administered 2015-07-21: 25 mg via INTRAVENOUS
  Filled 2015-07-21: qty 1

## 2015-07-21 MED ORDER — PANTOPRAZOLE SODIUM 40 MG IV SOLR
40.0000 mg | Freq: Once | INTRAVENOUS | Status: AC
  Administered 2015-07-21: 40 mg via INTRAVENOUS
  Filled 2015-07-21: qty 40

## 2015-07-21 MED ORDER — METOCLOPRAMIDE HCL 5 MG/ML IJ SOLN
10.0000 mg | Freq: Once | INTRAMUSCULAR | Status: AC
  Administered 2015-07-21: 10 mg via INTRAVENOUS
  Filled 2015-07-21: qty 2

## 2015-07-21 NOTE — Discharge Summary (Signed)
[facility] [place] at [facility]   PATIENT NAME: Laurie Frederick    MR#:  [id number]  DATE OF BIRTH:  January 13, 1976  DATE OF ADMISSION:  07/13/2015 ADMITTING PHYSICIAN: Adrian Saran, MD  DATE OF DISCHARGE: 07/16/2015 11:33 AM  PRIMARY CARE PHYSICIAN: Baruch Gouty, MD    ADMISSION DIAGNOSIS:  Ketosis [E87.2] Refractory nausea and vomiting [R11.2] Intractable cyclical vomiting with nausea [G43.A1]  DISCHARGE DIAGNOSIS:  Active Problems:   Nausea & vomiting   Helicobacter positive gastritis   Gastritis and gastroduodenitis   SECONDARY DIAGNOSIS:   Past Medical History  Diagnosis Date  . Sleep apnea   . GERD (gastroesophageal reflux disease)   . Hyperlipidemia   . Depression   . Anxiety   . PTSD (post-traumatic stress disorder)   . Obesity   . Anemia   . Vitamin D deficiency   . Nontoxic uninodular goiter      ADMITTING HISTORY  Laurie Frederick is a 39 y.o. female with a known history of gastritis who was recently discharged on the hospital after almost a week stay with persistent nausea and vomiting. Patient reports that she had an EGD performed which showed gastritis and the thought was that her nausea and vomiting was secondary to gastritis. She was discharged and able to tolerate a liquid diet however today she comes in with persistent nausea and vomiting and unable to take in medications. She has been trying Zofran for nausea but due to nausea and vomiting she is unable to tolerate this. She also has midepigastric abdominal pain which was present during her last hospital physician. She had an EGD during her last auscultation which showed gastritis. At this time I'm unable to obtain records from her last hospitalization as the record indicates the patient has been marked for potential merged. The emergency not yet occurred, sedated for this patient may be incomplete.   HOSPITAL COURSE:   * Gastritis is likely cause of intractable vomiting and  abdominal pain. Recent CT scan of the abdomen and pelvis reviewed and showed nothing acute. H. Pylori positive on biopsy from recent EGD Started Amoxicillin and clarithromycin with BID PPI. Symptomatic treatment helped patient significantly and she has tolerated diet prior to discharge prior to discharge. Some nausea but no vomiting. F/U with GI in 2 weeks.  * Dehydration and hypokalemia Replaced orally and IV  * Elevated blood pressure without diagnosis of hypertension Likely from pain. Improved by discharge/  Stable for discharge   CONSULTS OBTAINED:  Treatment Team:  Wallace Cullens, MD  DRUG ALLERGIES:  No Known Allergies  DISCHARGE MEDICATIONS:   Discharge Medication List as of 07/16/2015 11:08 AM    START taking these medications   Details  amoxicillin (AMOXIL) 500 MG capsule Take 2 capsules (1,000 mg total) by mouth 2 (two) times daily., Starting 07/15/2015, Until Discontinued, Normal    clarithromycin (BIAXIN) 500 MG tablet Take 1 tablet (500 mg total) by mouth 2 (two) times daily., Starting 07/15/2015, Until Discontinued, Normal    nicotine (NICODERM CQ - DOSED IN MG/24 HR) 7 mg/24hr patch Place 1 patch (7 mg total) onto the skin daily., Starting 07/16/2015, Until Discontinued, Normal      CONTINUE these medications which have CHANGED   Details  HYDROcodone-acetaminophen (NORCO/VICODIN) 5-325 MG per tablet Take 1 tablet by mouth every 6 (six) hours as needed for moderate pain., Starting 07/16/2015, Until Discontinued, Print    pantoprazole (PROTONIX) 40 MG tablet Take 1 tablet (40 mg total) by mouth 2 (two)  times daily before a meal., Starting 07/15/2015, Until Discontinued, Normal      CONTINUE these medications which have NOT CHANGED   Details  alprazolam (XANAX) 2 MG tablet Take 2 mg by mouth 2 (two) times daily., Until Discontinued, Historical Med    citalopram (CELEXA) 20 MG tablet Take 40 mg by mouth at bedtime., Until Discontinued, Historical Med    promethazine  (PHENERGAN) 25 MG suppository Place 25 mg rectally every 6 (six) hours as needed for nausea or vomiting., Until Discontinued, Historical Med    QUEtiapine (SEROQUEL) 50 MG tablet Take 100 mg by mouth at bedtime. , Until Discontinued, Historical Med    sucralfate (CARAFATE) 1 G tablet Take 1 g by mouth 4 (four) times daily -  with meals and at bedtime., Until Discontinued, Historical Med    zolpidem (AMBIEN) 10 MG tablet Take 10 mg by mouth at bedtime., Until Discontinued, Historical Med      STOP taking these medications     ciprofloxacin (CIPRO) 500 MG tablet          Today    VITAL SIGNS:  Blood pressure 129/75, pulse 64, temperature 98.1 F (36.7 C), temperature source Oral, resp. rate 19, height  (1.6 m), weight 78.019 kg (172 lb), last menstrual period 07/06/2015, SpO2 100 %.  I/O:  No intake or output data in the 24 hours ending 07/21/15 2007  PHYSICAL EXAMINATION:  Physical Exam  GENERAL:  39 y.o.-year-old patient lying in the bed with no acute distress.  LUNGS: Normal breath sounds bilaterally, no wheezing, rales,rhonchi or crepitation. No use of accessory muscles of respiration.  CARDIOVASCULAR: S1, S2 normal. No murmurs, rubs, or gallops.  ABDOMEN: Soft, non-tender, non-distended. Bowel sounds present. No organomegaly or mass.  NEUROLOGIC: Moves all 4 extremities. PSYCHIATRIC: The patient is alert and oriented x 3.  SKIN: No obvious rash, lesion, or ulcer.   DATA REVIEW:   CBC No results for input(s): WBC, HGB, HCT, PLT in the last 168 hours.  Chemistries   Recent Labs Lab 07/15/15 0516 07/16/15 0520  NA 141 141  K 2.9* 3.9  CL 108 110  CO2 27 26  GLUCOSE 103* 90  BUN 6 6  CREATININE 0.84 0.82  CALCIUM 8.4* 8.0*  MG 1.7  --     Cardiac Enzymes No results for input(s): TROPONINI in the last 168 hours.  Microbiology Results  Results for orders placed or performed during the hospital encounter of 07/12/15  Urine culture     Status: None    Collection Time: 07/12/15  7:30 AM  Result Value Ref Range Status   Specimen Description URINE, CLEAN CATCH  Final   Special Requests NONE  Final   Culture MULTIPLE SPECIES PRESENT, SUGGEST RECOLLECTION  Final   Report Status 07/14/2015 FINAL  Final    RADIOLOGY:  No results found.    Follow up with PCP in 1 week.  Management plans discussed with the patient, family and they are in agreement.  CODE STATUS:   TOTAL TIME TAKING CARE OF THIS PATIENT ON DAY OF DISCHARGE: more than 30 minutes.    Milagros Loll R M.D on 07/21/2015 at 8:07 PM  Between 7am to 6pm - Pager - [phone]  After 6pm go to [email or web address] - password [email or web address] [facility]  [place]  Office  [phone]  CC: Primary care physician; Baruch Gouty, MD

## 2015-07-21 NOTE — ED Notes (Addendum)
Patient presents to triage via EMS. Patient with reports of LUQ abdominal pain with (+) N/V. Recent 3 days admission for gastritis and intractable vomiting. Patient started with symptoms tonight after eating heavy meal; "I think it was the pintos with red pepper flakes." Rx at home for Amoxicillin, Clarithromycin, and Protonix.

## 2015-07-22 ENCOUNTER — Encounter: Payer: Self-pay | Admitting: Internal Medicine

## 2015-07-22 ENCOUNTER — Observation Stay: Payer: Medicaid Other

## 2015-07-22 DIAGNOSIS — R1110 Vomiting, unspecified: Secondary | ICD-10-CM

## 2015-07-22 DIAGNOSIS — R112 Nausea with vomiting, unspecified: Secondary | ICD-10-CM | POA: Diagnosis present

## 2015-07-22 DIAGNOSIS — R1013 Epigastric pain: Secondary | ICD-10-CM | POA: Diagnosis not present

## 2015-07-22 LAB — BASIC METABOLIC PANEL
Anion gap: 5 (ref 5–15)
CHLORIDE: 111 mmol/L (ref 101–111)
CO2: 26 mmol/L (ref 22–32)
CREATININE: 0.6 mg/dL (ref 0.44–1.00)
Calcium: 8.4 mg/dL — ABNORMAL LOW (ref 8.9–10.3)
GFR calc Af Amer: >60 mL/min (ref >60)
GFR calc non Af Amer: >60 mL/min (ref >60)
GLUCOSE: 139 mg/dL — AB (ref 65–99)
Potassium: 3.8 mmol/L (ref 3.5–5.1)
Sodium: 142 mmol/L (ref 135–145)

## 2015-07-22 LAB — CBC
HCT: 34.1 % — ABNORMAL LOW (ref 35.0–47.0)
Hemoglobin: 11.1 g/dL — ABNORMAL LOW (ref 12.0–16.0)
MCH: 29.1 pg (ref 26.0–34.0)
MCHC: 32.6 g/dL (ref 32.0–36.0)
MCV: 89.4 fL (ref 80.0–100.0)
PLATELETS: 244 10*3/uL (ref 150–440)
RBC: 3.82 MIL/uL (ref 3.80–5.20)
RDW: 15.1 % — AB (ref 11.5–14.5)
WBC: 5.2 10*3/uL (ref 3.6–11.0)

## 2015-07-22 MED ORDER — HYDROMORPHONE HCL 1 MG/ML IJ SOLN
0.5000 mg | Freq: Once | INTRAMUSCULAR | Status: AC
  Administered 2015-07-22: 0.5 mg via INTRAVENOUS

## 2015-07-22 MED ORDER — SODIUM CHLORIDE 0.9 % IV BOLUS (SEPSIS)
1000.0000 mL | Freq: Once | INTRAVENOUS | Status: AC
  Administered 2015-07-22: 1000 mL via INTRAVENOUS

## 2015-07-22 MED ORDER — QUETIAPINE FUMARATE 25 MG PO TABS
50.0000 mg | ORAL_TABLET | Freq: Every day | ORAL | Status: DC
  Administered 2015-07-22 – 2015-07-25 (×4): 50 mg via ORAL
  Filled 2015-07-22 (×4): qty 2

## 2015-07-22 MED ORDER — HYDROMORPHONE HCL 1 MG/ML IJ SOLN
1.0000 mg | Freq: Once | INTRAMUSCULAR | Status: AC
  Administered 2015-07-22: 1 mg via INTRAVENOUS
  Filled 2015-07-22: qty 1

## 2015-07-22 MED ORDER — ALPRAZOLAM 1 MG PO TABS
2.0000 mg | ORAL_TABLET | Freq: Two times a day (BID) | ORAL | Status: DC
  Administered 2015-07-22 – 2015-07-26 (×10): 2 mg via ORAL
  Filled 2015-07-22 (×10): qty 2

## 2015-07-22 MED ORDER — METOCLOPRAMIDE HCL 5 MG PO TABS
10.0000 mg | ORAL_TABLET | Freq: Three times a day (TID) | ORAL | Status: DC
  Administered 2015-07-22 – 2015-07-25 (×12): 10 mg via ORAL
  Administered 2015-07-26: 5 mg via ORAL
  Filled 2015-07-22 (×13): qty 2

## 2015-07-22 MED ORDER — IOHEXOL 240 MG/ML SOLN: 25.0000 mL | INTRAMUSCULAR | Status: AC

## 2015-07-22 MED ORDER — GI COCKTAIL ~~LOC~~
30.0000 mL | ORAL | Status: AC
  Filled 2015-07-22: qty 30

## 2015-07-22 MED ORDER — MORPHINE SULFATE (PF) 2 MG/ML IV SOLN
2.0000 mg | INTRAVENOUS | Status: DC | PRN
  Administered 2015-07-22 – 2015-07-23 (×5): 2 mg via INTRAVENOUS
  Filled 2015-07-22 (×5): qty 1

## 2015-07-22 MED ORDER — LORAZEPAM 2 MG/ML IJ SOLN: 2.0000 mg | Freq: Once | INTRAMUSCULAR | Status: DC

## 2015-07-22 MED ORDER — HYDROMORPHONE HCL 1 MG/ML IJ SOLN
INTRAMUSCULAR | Status: AC
  Administered 2015-07-22: 0.5 mg via INTRAVENOUS
  Filled 2015-07-22: qty 1

## 2015-07-22 MED ORDER — PROMETHAZINE HCL 25 MG PO TABS
12.5000 mg | ORAL_TABLET | Freq: Four times a day (QID) | ORAL | Status: DC | PRN
  Administered 2015-07-24: 12.5 mg via ORAL
  Filled 2015-07-22: qty 1

## 2015-07-22 MED ORDER — CLARITHROMYCIN 500 MG PO TABS
500.0000 mg | ORAL_TABLET | Freq: Two times a day (BID) | ORAL | Status: DC
  Administered 2015-07-22 – 2015-07-26 (×10): 500 mg via ORAL
  Filled 2015-07-22 (×13): qty 1

## 2015-07-22 MED ORDER — ONDANSETRON HCL 4 MG/2ML IJ SOLN
4.0000 mg | Freq: Once | INTRAMUSCULAR | Status: AC
  Administered 2015-07-22: 4 mg via INTRAVENOUS
  Filled 2015-07-22: qty 2

## 2015-07-22 MED ORDER — PANTOPRAZOLE SODIUM 40 MG PO TBEC
40.0000 mg | DELAYED_RELEASE_TABLET | Freq: Two times a day (BID) | ORAL | Status: DC
  Administered 2015-07-22 – 2015-07-23 (×3): 40 mg via ORAL
  Filled 2015-07-22 (×3): qty 1

## 2015-07-22 MED ORDER — OXYCODONE HCL 5 MG PO TABS
5.0000 mg | ORAL_TABLET | ORAL | Status: DC | PRN
  Administered 2015-07-22 – 2015-07-23 (×3): 5 mg via ORAL
  Filled 2015-07-22 (×3): qty 1

## 2015-07-22 MED ORDER — PROMETHAZINE HCL 25 MG/ML IJ SOLN
12.5000 mg | INTRAMUSCULAR | Status: DC | PRN
  Administered 2015-07-22 – 2015-07-23 (×5): 12.5 mg via INTRAVENOUS
  Filled 2015-07-22 (×5): qty 1

## 2015-07-22 MED ORDER — ACETAMINOPHEN 650 MG RE SUPP: 650.0000 mg | Freq: Four times a day (QID) | RECTAL | Status: DC | PRN

## 2015-07-22 MED ORDER — ZOLPIDEM TARTRATE 5 MG PO TABS
10.0000 mg | ORAL_TABLET | Freq: Every day | ORAL | Status: DC
  Administered 2015-07-22 – 2015-07-25 (×4): 10 mg via ORAL
  Filled 2015-07-22 (×4): qty 2

## 2015-07-22 MED ORDER — SUCRALFATE 1 G PO TABS
1.0000 g | ORAL_TABLET | Freq: Three times a day (TID) | ORAL | Status: DC
  Administered 2015-07-22 – 2015-07-26 (×17): 1 g via ORAL
  Filled 2015-07-22 (×17): qty 1

## 2015-07-22 MED ORDER — AMOXICILLIN 500 MG PO CAPS
1000.0000 mg | ORAL_CAPSULE | Freq: Two times a day (BID) | ORAL | Status: DC
  Administered 2015-07-22 – 2015-07-26 (×10): 1000 mg via ORAL
  Filled 2015-07-22 (×14): qty 2

## 2015-07-22 MED ORDER — HEPARIN SODIUM (PORCINE) 5000 UNIT/ML IJ SOLN
5000.0000 [IU] | Freq: Three times a day (TID) | INTRAMUSCULAR | Status: DC
  Administered 2015-07-22 – 2015-07-25 (×11): 5000 [IU] via SUBCUTANEOUS
  Filled 2015-07-22 (×12): qty 1

## 2015-07-22 MED ORDER — ACETAMINOPHEN 325 MG PO TABS
650.0000 mg | ORAL_TABLET | Freq: Four times a day (QID) | ORAL | Status: DC | PRN
  Administered 2015-07-25: 06:00:00 650 mg via ORAL
  Filled 2015-07-22: qty 2

## 2015-07-22 MED ORDER — CITALOPRAM HYDROBROMIDE 20 MG PO TABS
20.0000 mg | ORAL_TABLET | Freq: Every day | ORAL | Status: DC
  Administered 2015-07-22 – 2015-07-25 (×4): 20 mg via ORAL
  Filled 2015-07-22 (×4): qty 1

## 2015-07-22 MED ORDER — KETOROLAC TROMETHAMINE 30 MG/ML IJ SOLN
30.0000 mg | Freq: Once | INTRAMUSCULAR | Status: AC
  Administered 2015-07-22: 30 mg via INTRAVENOUS
  Filled 2015-07-22: qty 1

## 2015-07-22 MED ORDER — SODIUM CHLORIDE 0.9 % IV SOLN
INTRAVENOUS | Status: DC
  Administered 2015-07-22 – 2015-07-25 (×7): via INTRAVENOUS

## 2015-07-22 MED ORDER — NICOTINE 7 MG/24HR TD PT24
7.0000 mg | MEDICATED_PATCH | Freq: Every day | TRANSDERMAL | Status: DC
  Administered 2015-07-22 – 2015-07-26 (×5): 7 mg via TRANSDERMAL
  Filled 2015-07-22 (×6): qty 1

## 2015-07-22 NOTE — ED Provider Notes (Signed)
[facility] Emergency Department Provider Note  ____________________________________________  Time seen: 11:30 PM  I have reviewed the triage vital signs and the nursing notes.   HISTORY  Chief Complaint Abdominal Pain; Nausea; and Emesis    HPI Laurie Frederick is a 39 y.o. female who complains of severe epigastric pain and nausea and vomiting since eating dinner tonight. She was recently hospitalized with intractable nausea and vomiting which turned out to be h pylori positive gastritis based on EGD.She had been compliant with diet instructions are given to her, but tonight her mom  cooked pinto beans with red pepper flakes in them and shortly after eating this meal, her symptoms worsened again. She is otherwise been compliant with Protonix clarithromycin and amoxicillin that she had been prescribed. She has those medicines with her in the emergency room. No hematemesis. No chest pain shortness breath fever chills or syncope.     Past Medical History  Diagnosis Date  . Sleep apnea   . GERD (gastroesophageal reflux disease)   . Hyperlipidemia   . Depression   . Anxiety   . PTSD (post-traumatic stress disorder)   . Obesity   . Anemia   . Vitamin D deficiency   . Nontoxic uninodular goiter      Patient Active Problem List   Diagnosis Date Noted  . Helicobacter positive gastritis 07/16/2015  . Gastritis and gastroduodenitis 07/16/2015  . Anemia 07/13/2015  . Hyperlipidemia 07/13/2015  . Vitamin D deficiency 07/13/2015  . Thyroid nodule 07/13/2015  . Polyp of gallbladder 07/13/2015  . Liver nodule 07/13/2015  . Nausea & vomiting 07/13/2015  . Gastroenteritis 07/06/2015  . Viral gastroenteritis 07/05/2015  . 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 Alcohol abuse 04/01/2015     Past Surgical History  Procedure Laterality Date  . Cesarean section    . Esophagogastroduodenoscopy (egd) with propofol Left 07/07/2015    Procedure:  ESOPHAGOGASTRODUODENOSCOPY (EGD) WITH PROPOFOL;  Surgeon: Scot Jun, MD;  Location: [facility] ENDOSCOPY;  Service: Endoscopy;  Laterality: Left;  . Cesarean section       Current Outpatient Rx  Name  Route  Sig  Dispense  Refill  . alprazolam (XANAX) 2 MG tablet   Oral   Take 2 mg by mouth 2 (two) times daily.         Marland Kitchen amoxicillin (AMOXIL) 500 MG capsule   Oral   Take 2 capsules (1,000 mg total) by mouth 2 (two) times daily.   26 capsule   0   . citalopram (CELEXA) 20 MG tablet   Oral   Take 1 tablet (20 mg total) by mouth at bedtime.   30 tablet   0   . clarithromycin (BIAXIN) 500 MG tablet   Oral   Take 1 tablet (500 mg total) by mouth 2 (two) times daily.   26 tablet   0   . HYDROcodone-acetaminophen (NORCO) 5-325 MG per tablet   Oral   Take 1 tablet by mouth every 6 (six) hours as needed for moderate pain.   20 tablet   0   . HYDROcodone-acetaminophen (NORCO/VICODIN) 5-325 MG per tablet   Oral   Take 1 tablet by mouth every 6 (six) hours as needed for moderate pain.   20 tablet   0   . HYDROmorphone (DILAUDID) 2 MG tablet   Oral   Take 1 tablet (2 mg total) by mouth every 4 (four) hours as needed for severe pain.   5 tablet   0   .  metoCLOPramide (REGLAN) 10 MG tablet   Oral   Take 1 tablet (10 mg total) by mouth 3 (three) times daily with meals.   90 tablet   1   . nicotine (NICODERM CQ - DOSED IN MG/24 HR) 7 mg/24hr patch   Transdermal   Place 1 patch (7 mg total) onto the skin daily.   28 patch   0   . pantoprazole (PROTONIX) 40 MG tablet   Oral   Take 1 tablet (40 mg total) by mouth daily.   60 tablet   0   . pantoprazole (PROTONIX) 40 MG tablet   Oral   Take 1 tablet (40 mg total) by mouth 2 (two) times daily before a meal.   60 tablet   0   . promethazine (PHENERGAN) 25 MG suppository   Rectal   Place 1 suppository (25 mg total) rectally every 6 (six) hours as needed for nausea or vomiting.   12 each   0   . QUEtiapine  (SEROQUEL) 50 MG tablet   Oral   Take 1 tablet (50 mg total) by mouth at bedtime.   30 tablet   0   . sucralfate (CARAFATE) 1 G tablet   Oral   Take 1 tablet (1 g total) by mouth 4 (four) times daily -  with meals and at bedtime. Dissolve in water and take it.   120 tablet   0   . traMADol (ULTRAM) 50 MG tablet   Oral   Take 1 tablet (50 mg total) by mouth every 12 (twelve) hours as needed for moderate pain.   10 tablet   0   . zolpidem (AMBIEN) 10 MG tablet   Oral   Take 10 mg by mouth at bedtime.            Allergies Review of patient's allergies indicates no known allergies.   Family History  Problem Relation Age of Onset  . Lung cancer Father   . Lung cancer Paternal Aunt   . Bone cancer Paternal Uncle   . Depression Mother   . Anxiety disorder Mother   . Arthritis Mother   . Hyperlipidemia Mother   . Cancer Father     lung cancer  . Alcohol abuse Father   . Hypertension Maternal Grandmother   . Diabetes Paternal Grandmother   . Heart disease Paternal Grandmother   . Hypertension Paternal Grandmother     Social History Social History  Substance Use Topics  . Smoking status: Current Some Day Smoker -- 0.50 packs/day    Types: Cigarettes  . Smokeless tobacco: None  . Alcohol Use: No     Comment: occasional    Review of Systems  Constitutional:   No fever or chills. No weight changes Eyes:   No blurry vision or double vision.  ENT:   No sore throat. Cardiovascular:   No chest pain. Respiratory:   No dyspnea or cough. Gastrointestinal:   Epigastric abdominal pain with vomiting as above. No diarrhea..  No BRBPR or melena. Genitourinary:   Negative for dysuria, urinary retention, bloody urine, or difficulty urinating. Musculoskeletal:   Negative for back pain. No joint swelling or pain. Skin:   Negative for rash. Neurological:   Negative for headaches, focal weakness or numbness. Psychiatric:  No anxiety or depression.   Endocrine:  No hot/cold  intolerance, changes in energy, or sleep difficulty.  10-point ROS otherwise negative.  ____________________________________________   PHYSICAL EXAM:  VITAL SIGNS: ED Triage Vitals  Enc Vitals  Group     BP 07/21/15 2227 160/101 mmHg     Pulse Rate 07/21/15 2227 96     Resp 07/21/15 2227 18     Temp 07/21/15 2227 98.6 F (37 C)     Temp Source 07/21/15 2227 Oral     SpO2 07/21/15 2227 100 %     Weight --      Height --      Head Cir --      Peak Flow --      Pain Score 07/21/15 2228 10     Pain Loc --      Pain Edu? --      Excl. in GC? --      Constitutional:   Alert and oriented. Well appearing and in no distress. Eyes:   No scleral icterus. No conjunctival pallor. PERRL. EOMI ENT   Head:   Normocephalic and atraumatic.   Nose:   No congestion/rhinnorhea. No septal hematoma   Mouth/Throat:   MMM, no pharyngeal erythema. No peritonsillar mass. No uvula shift.   Neck:   No stridor. No SubQ emphysema. No meningismus. Hematological/Lymphatic/Immunilogical:   No cervical lymphadenopathy. Cardiovascular:   RRR. Normal and symmetric distal pulses are present in all extremities. No murmurs, rubs, or gallops. Respiratory:   Normal respiratory effort without tachypnea nor retractions. Breath sounds are clear and equal bilaterally. No wheezes/rales/rhonchi. Gastrointestinal:   Soft with epigastric tenderness.. No distention. There is no CVA tenderness.  No rebound, rigidity, or guarding. Genitourinary:   deferred Musculoskeletal:   Nontender with normal range of motion in all extremities. No joint effusions.  No lower extremity tenderness.  No edema. Neurologic:   Normal speech and language.  CN 2-10 normal. Motor grossly intact. No pronator drift.  Normal gait. No gross focal neurologic deficits are appreciated.  Skin:    Skin is warm, dry and intact. No rash noted.  No petechiae, purpura, or bullae. Psychiatric:   Mood and affect are normal. Speech and behavior  are normal. Patient exhibits appropriate insight and judgment.  ____________________________________________    LABS (pertinent positives/negatives) (all labs ordered are listed, but only abnormal results are displayed) Labs Reviewed  COMPREHENSIVE METABOLIC PANEL - Abnormal; Notable for the following:    Glucose, Bld 107 (*)    BUN <5 (*)    All other components within normal limits  CBC - Abnormal; Notable for the following:    RDW 14.8 (*)    All other components within normal limits  LIPASE, BLOOD  URINALYSIS COMPLETEWITH MICROSCOPIC ([facility] ONLY)  POC URINE PREG, ED   ____________________________________________   EKG    ____________________________________________    RADIOLOGY    ____________________________________________   PROCEDURES   ____________________________________________   INITIAL IMPRESSION / ASSESSMENT AND PLAN / ED COURSE  Pertinent labs & imaging results that were available during my care of the patient were reviewed by me and considered in my medical decision making (see chart for details).  Patient presents with abdominal pain and vomiting this consistent with an exacerbation for gastritis due to diet noncompliance. We'll give her IV fluids, pain medicine and nausea medicine to try and control her symptoms. Unfortunately she has severe disease and if her symptoms are to be intractable she may need to be observed in the hospital for serial IV medications.  ----------------------------------------- 1:45 AM on 07/22/2015 -----------------------------------------  Patient reports her nausea is better and has been noted to globe a lot of water. She does still complain of persistent severe abdominal pain.  Seems this is at least in part related to the discomfort from vomiting. Review of the records does note that she is taking Dilaudid tablets at home, so we will increase her dosage of IV Dilaudid and Toradol here and attempt to control the  symptoms. Still has persistent epigastric tenderness.  ----------------------------------------- 2:40 AM on 07/22/2015 -----------------------------------------  Patient reports she doesn't feel any better. He recently vomited again. Still tender in the epigastrium. We'll continue IV fluids and IV antiemetics and discussed with hospitalist for admission.     ____________________________________________   FINAL CLINICAL IMPRESSION(S) / ED DIAGNOSES  Final diagnoses:  Epigastric pain  Helicobacter positive gastritis      Sharman Cheek, MD 07/22/15 [phone]

## 2015-07-22 NOTE — Plan of Care (Addendum)
Problem: Discharge Progression Outcomes Goal: Other Discharge Outcomes/Goals Outcome: Progressing Plan of care progress: -continues IV fluids -emesis x2 after PO contrast for CT scan, CT scan cancelled by MD, pt continues to have emesis when taking in anything PO -PRN phenergan for nausea/vomiting with relief -PRN morphine for abdominal pain with relief -GI consulting -[place] of abdomen pending

## 2015-07-22 NOTE — Progress Notes (Addendum)
[facility] [place] at [facility]   PATIENT NAME: Laurie Frederick    MR#:  [id number]  DATE OF BIRTH:  Nov 17, 1975  SUBJECTIVE:  Patient here with persistent nausea and vomiting. Patient was recently discharged from the hospital. This is her third hospitalization for nausea and vomiting. She was well prior to discharge. She ate a large dinner and subsequently continued to have nausea and vomiting afterwards.  REVIEW OF SYSTEMS:    Review of Systems  Constitutional: Positive for weight loss and malaise/fatigue. Negative for fever and chills.  HENT: Negative for sore throat.   Eyes: Negative for blurred vision, double vision and pain.  Respiratory: Negative for cough, hemoptysis, shortness of breath and wheezing.   Cardiovascular: Negative for chest pain, palpitations, orthopnea and leg swelling.  Gastrointestinal: Positive for nausea, vomiting and abdominal pain. Negative for heartburn, diarrhea and constipation.  Genitourinary: Negative for dysuria and hematuria.  Musculoskeletal: Negative for back pain and joint pain.  Skin: Negative for rash.  Neurological: Negative for sensory change, speech change, focal weakness and headaches.  Endo/Heme/Allergies: Does not bruise/bleed easily.  Psychiatric/Behavioral: Negative for depression. The patient is not nervous/anxious.       DRUG ALLERGIES:  No Known Allergies  VITALS:  Blood pressure 109/58, pulse 75, temperature 98.4 F (36.9 C), temperature source Oral, resp. rate 18, height  (1.6 m), weight 79.652 kg (175 lb 9.6 oz), last menstrual period 07/06/2015, SpO2 100 %.  PHYSICAL EXAMINATION:   Physical Exam  Constitutional: She is oriented to person, place, and time and well-developed, well-nourished, and in no distress. No distress.  HENT:  Head: Normocephalic.  Eyes: No scleral icterus.  Neck: Normal range of motion. Neck supple. No JVD present. No tracheal deviation present.  Cardiovascular:  Normal rate, regular rhythm and normal heart sounds.  Exam reveals no gallop and no friction rub.   No murmur heard. Pulmonary/Chest: Effort normal and breath sounds normal. No respiratory distress. She has no wheezes. She has no rales. She exhibits no tenderness.  Abdominal: Soft. Bowel sounds are normal. She exhibits no distension and no mass. There is tenderness (Mid epigastric region). There is no rebound and no guarding.  Musculoskeletal: Normal range of motion. She exhibits no edema.  Neurological: She is alert and oriented to person, place, and time.  Skin: Skin is warm. No rash noted. No erythema.  Psychiatric: Affect and judgment normal.       LABORATORY PANEL:   CBC  Recent Labs Lab 07/22/15 0459  WBC 5.2  HGB 11.1*  HCT 34.1*  PLT 244   ------------------------------------------------------------------------------------------------------------------  Chemistries   Recent Labs Lab 07/21/15 2241 07/22/15 0459  NA 142 142  K 3.7 3.8  CL 109 111  CO2 24 26  GLUCOSE 107* 139*  BUN <5* <5*  CREATININE 0.72 0.60  CALCIUM 9.4 8.4*  AST 25  --   ALT 19  --   ALKPHOS 67  --   BILITOT 0.6  --    ------------------------------------------------------------------------------------------------------------------  Cardiac Enzymes No results for input(s): TROPONINI in the last 168 hours. ------------------------------------------------------------------------------------------------------------------  RADIOLOGY:  No results found.   ASSESSMENT AND PLAN:   39 year old female recently diagnosed with H. pylori gastritis who presents with persistent nausea and vomiting as well as weight loss.   1. Intractable Nausea and vomiting: This is likely secondary gastritis. She was being treated for H. pylori. Continue supportive medication. I will consult GI to see if they've may want to perform another endoscopy as this  is her third hospitalization. I will order right  upper chronic ultrasound to evaluate for gallstones.  2. Weight loss: Patient had had a recent CT of the abdomen shows no acute etiology/masses. I will check TSH and HIV. 3. H. pylori gastritis: Continue triple treatment.    All the records are reviewed and case discussed with Care Management/Social Worker. Management plans discussed with the patient and she is  in agreement.  CODE STATUS: FULL    TOTAL TIME TAKING CARE OF THIS PATIENT: 25 minutes.   POSSIBLE D/C IN 1-2 DAYS, DEPENDING ON CLINICAL CONDITION.   MODY, SITAL M.D on 07/22/2015 at 11:00 AM  Between 7am to 6pm - Pager - [phone]  After 6pm go to [email or web address] - password [email or web address] [facility]  [facility]  Office  [phone]  CC: Primary care physician; Baruch Gouty, MD

## 2015-07-22 NOTE — Care Management (Signed)
Admitted to this facility with the diagnosis of nausea/vomitting. Discharged from this facility 07/16/15. EGD done 07/07/15. Lives with family. Sister is Leticia Clas [phone]). Sees Dr. Sherie Don at [facility] in [place].  Didn't  see Dr Sherie Don in between hospital visits. Medicaid is listed as payor source. Takes care of all basic activities of daily lving herself. States she does have a driver's license, but doesn't drive. Does not work. Mother, Corrie Dandy, will transport. Gwenette Greet RN MSN Care Management [phone]

## 2015-07-22 NOTE — Consult Note (Signed)
[facility]  [address] [place], [place] [address] Phone: [phone] Fax : [phone]  Consultation  Referring Gabrelle Roca:     No ref. Priyansh Pry found Primary Care Physician:  Baruch Gouty, MD Primary Gastroenterologist:  Dr. Mechele Collin         Reason for Consultation:     Nausea vomiting and abdominal pain  Date of Admission:  07/21/2015 Date of Consultation:  07/22/2015         HPI:   Laurie Frederick is a 39 y.o. female who comes in with nausea vomiting epigastric pain. The patient was recently discharged from the hospital twice for the same. The patient states she went home and started eating more which caused her to relapse and have the pain again. The patient had an upper endoscopy by Dr. Mechele Collin which showed severe gastritis. The patient was on a PPI for this and she was being treated for H. pylori. The patient states that she has been vomiting up the H. pylori medication. There is no report of any vomiting blood or black stools. The patient does report that she has some diarrhea from the antibiotics she has been getting. The patient was to follow up with Dr. Mechele Collin on October 2 but because she came back in for her abdominal pain and a GI consult was called. The patient reports the pain to be extreme and she has significant nausea despite Phenergan and morphine and Toradol  Past Medical History  Diagnosis Date  . Sleep apnea   . GERD (gastroesophageal reflux disease)   . Hyperlipidemia   . Depression   . Anxiety   . PTSD (post-traumatic stress disorder)   . Obesity   . Anemia   . Vitamin D deficiency   . Nontoxic uninodular goiter     Past Surgical History  Procedure Laterality Date  . Cesarean section    . Esophagogastroduodenoscopy (egd) with propofol Left 07/07/2015    Procedure: ESOPHAGOGASTRODUODENOSCOPY (EGD) WITH PROPOFOL;  Surgeon: Scot Jun, MD;  Location: [facility] ENDOSCOPY;  Service: Endoscopy;  Laterality: Left;  . Cesarean section        Prior to Admission medications   Medication Sig Start Date End Date Taking? Authorizing Jovian Lembcke  alprazolam Prudy Feeler) 2 MG tablet Take 2 mg by mouth 2 (two) times daily.   Yes Historical Chidiebere Wynn, MD  amoxicillin (AMOXIL) 500 MG capsule Take 2 capsules (1,000 mg total) by mouth 2 (two) times daily. 07/15/15  Yes Srikar Sudini, MD  citalopram (CELEXA) 20 MG tablet Take 1 tablet (20 mg total) by mouth at bedtime. 07/09/15  Yes Vipul Sherryll Burger, MD  clarithromycin (BIAXIN) 500 MG tablet Take 1 tablet (500 mg total) by mouth 2 (two) times daily. 07/15/15  Yes Srikar Sudini, MD  HYDROcodone-acetaminophen (NORCO) 5-325 MG per tablet Take 1 tablet by mouth every 6 (six) hours as needed for moderate pain. 07/12/15  Yes Jennye Moccasin, MD  HYDROcodone-acetaminophen (NORCO/VICODIN) 5-325 MG per tablet Take 1 tablet by mouth every 6 (six) hours as needed for moderate pain. 07/16/15  Yes Srikar Sudini, MD  HYDROmorphone (DILAUDID) 2 MG tablet Take 1 tablet (2 mg total) by mouth every 4 (four) hours as needed for severe pain. 07/09/15  Yes Vipul Sherryll Burger, MD  metoCLOPramide (REGLAN) 10 MG tablet Take 1 tablet (10 mg total) by mouth 3 (three) times daily with meals. 07/05/15 07/04/16 Yes Irean Hong, MD  nicotine (NICODERM CQ - DOSED IN MG/24 HR) 7 mg/24hr patch Place 1 patch (7 mg total)  onto the skin daily. 07/16/15  Yes Srikar Sudini, MD  pantoprazole (PROTONIX) 40 MG tablet Take 1 tablet (40 mg total) by mouth daily. 07/09/15  Yes Vipul Sherryll Burger, MD  pantoprazole (PROTONIX) 40 MG tablet Take 1 tablet (40 mg total) by mouth 2 (two) times daily before a meal. 07/15/15  Yes Srikar Sudini, MD  promethazine (PHENERGAN) 25 MG suppository Place 1 suppository (25 mg total) rectally every 6 (six) hours as needed for nausea or vomiting. 07/12/15  Yes Jennye Moccasin, MD  QUEtiapine (SEROQUEL) 50 MG tablet Take 1 tablet (50 mg total) by mouth at bedtime. 07/09/15  Yes Vipul Sherryll Burger, MD  sucralfate (CARAFATE) 1 G tablet Take 1 tablet (1 g total) by  mouth 4 (four) times daily -  with meals and at bedtime. Dissolve in water and take it. 07/09/15  Yes Vipul Sherryll Burger, MD  traMADol (ULTRAM) 50 MG tablet Take 1 tablet (50 mg total) by mouth every 12 (twelve) hours as needed for moderate pain. 07/09/15  Yes Vipul Sherryll Burger, MD  zolpidem (AMBIEN) 10 MG tablet Take 10 mg by mouth at bedtime.   Yes Historical Antonio Creswell, MD    Family History  Problem Relation Age of Onset  . Lung cancer Father   . Lung cancer Paternal Aunt   . Bone cancer Paternal Uncle   . Depression Mother   . Anxiety disorder Mother   . Arthritis Mother   . Hyperlipidemia Mother   . Cancer Father     lung cancer  . Alcohol abuse Father   . Hypertension Maternal Grandmother   . Diabetes Paternal Grandmother   . Heart disease Paternal Grandmother   . Hypertension Paternal Grandmother      Social History  Substance Use Topics  . Smoking status: Current Some Day Smoker -- 0.50 packs/day    Types: Cigarettes  . Smokeless tobacco: None  . Alcohol Use: Yes     Comment: occasional    Allergies as of 07/21/2015  . (No Known Allergies)    Review of Systems:    All systems reviewed and negative except where noted in HPI.   Physical Exam:  Vital signs in last 24 hours: Temp:  [98.2 F (36.8 C)-99.2 F (37.3 C)] 99.2 F (37.3 C) (09/22 1314) Pulse Rate:  [73-96] 84 (09/22 1314) Resp:  [16-18] 18 (09/22 0750) BP: (103-174)/(58-108) 174/94 mmHg (09/22 1314) SpO2:  [99 %-100 %] 100 % (09/22 1314) Weight:  [175 lb 9.6 oz (79.652 kg)] 175 lb 9.6 oz (79.652 kg) (09/22 0337) Last BM Date: 07/21/15 General:   Pleasant, cooperative in NAD Head:  Normocephalic and atraumatic. Eyes:   No icterus.   Conjunctiva pink. PERRLA. Ears:  Normal auditory acuity. Neck:  Supple; no masses or thyroidomegaly Lungs: Respirations even and unlabored. Lungs clear to auscultation bilaterally.   No wheezes, crackles, or rhonchi.  Heart:  Regular rate and rhythm;  Without murmur, clicks, rubs or  gallops Abdomen:  Soft, nondistended, epigastric tenderness Normal bowel sounds. No appreciable masses or hepatomegaly.  No rebound or guarding.  Rectal:  Not performed. Msk:  Symmetrical without gross deformities.  Strength  Extremities:  Without edema, cyanosis or clubbing. Neurologic:  Alert and oriented x3;  grossly normal neurologically. Skin:  Intact without significant lesions or rashes. Cervical Nodes:  No significant cervical adenopathy. Psych:  Alert and cooperative. Normal affect.  LAB RESULTS:  Recent Labs  07/21/15 2241 07/22/15 0459  WBC 7.8 5.2  HGB 12.9 11.1*  HCT 40.4 34.1*  PLT 273  244   BMET  Recent Labs  07/21/15 2241 07/22/15 0459  NA 142 142  K 3.7 3.8  CL 109 111  CO2 24 26  GLUCOSE 107* 139*  BUN <5* <5*  CREATININE 0.72 0.60  CALCIUM 9.4 8.4*   LFT  Recent Labs  07/21/15 2241  PROT 7.6  ALBUMIN 4.3  AST 25  ALT 19  ALKPHOS 67  BILITOT 0.6   PT/INR No results for input(s): LABPROT, INR in the last 72 hours.  STUDIES: US Abdomen Complete  07/22/2015   CLINICAL DATA:  Weight loss.  Epigastric pain.  EXAM: ULTRASOUND ABDOMEN COMPLETE  COMPARISON:  CT 07/05/2015 ; MRI 02/04/2014.  FINDINGS: Gallbladder: 9 mm non mobile echogenic focus within the gallbladder lumen potentially representing nonmobile stone or polyp. No gallbladder wall thickening. No pericholecystic fluid. Negative sonographic Murphy sign.  Common bile duct: Diameter: 2 mm  Liver: Within anterior right hepatic lobe there is a 1.5 x 1.1 x 1.3 cm hypoechoic lesion. Additionally within the inferior right hepatic lobe there is a 2.1 x 2.3 x 1.7 cm hypoechoic lesion. These lesions are poorly visualized and characterized on current exam.  IVC: No abnormality visualized.  Pancreas: Visualized portion unremarkable.  Spleen: Size and appearance within normal limits.  Right Kidney: Length: 12.4 cm. Echogenicity within normal limits. No mass or hydronephrosis visualized.  Left Kidney:  Length: 11.1 cm. Echogenicity within normal limits. No mass or hydronephrosis visualized.  Abdominal aorta: No aneurysm visualized.  Other findings: None.  IMPRESSION: Two hypoechoic hepatic lesions as described above. Patient had prior abdominal MRI 02/04/2014 for evaluation of these lesions, one lesion which was indeterminate in etiology. On this MRI it was recommended that the patient have a six-month follow-up MRI evaluation to demonstrate stability ; therefore, a follow-up evaluation with MRI (pre and post contrast) for comparison with prior MRI is recommended.  Polyp versus stone within the gallbladder lumen. Recommend follow-up gallbladder ultrasound in 12 months.   Electronically Signed   By: Annia Belt M.D.   On: 07/22/2015 13:08      Impression / Plan:   Laurie Frederick is a 39 y.o. y/o female with who comes in with recurrent nausea and vomiting with abdominal pain. The patient states she has been in the hospital 2 times in the past for the same thing with the last month. The patient was just recently discharged after having an upper endoscopy that showed her to have severe gastritis. The patient will be started back on the medication she was taking previously except it is unlikely she'll be able to keep down the treatment for H. pylori. She is now on Phenergan and double dose PPI. The patient will be monitored through her stay very the patient has been explained that gastritis and severe gastritis does not resolve overnight and she will have to be patient while the healing takes place.   Thank you for involving me in the care of this patient.        Darlina Rumpf, MD  07/22/2015, 3:37 PM   Note: This dictation was prepared with Dragon dictation along with smaller phrase technology. Any transcriptional errors that result from this process are unintentional.

## 2015-07-22 NOTE — ED Notes (Signed)
MD at bedside. 

## 2015-07-22 NOTE — ED Notes (Signed)
Pt reports some improvement in nausea.

## 2015-07-22 NOTE — H&P (Signed)
[facility] [place] at [facility]   PATIENT NAME: Laurie Frederick    MR#:  [id number]  DATE OF BIRTH:  02-11-1976     DATE OF ADMISSION:  07/21/2015  PRIMARY CARE PHYSICIAN: Baruch Gouty, MD   REQUESTING/REFERRING PHYSICIAN: Scotty Court   CHIEF COMPLAINT:   Chief Complaint  Patient presents with  . Abdominal Pain  . Nausea  . Emesis    HISTORY OF PRESENT ILLNESS:  Laurie Frederick  is a 39 y.o. female with a known history of gastritis presenting intractable nausea and vomiting. She states that she was doing well since discharge from hospital however after "eating some pinto beans" she began vomiting uncontrollably. She's had multiple episodes of emesis nonbloody/nonbilious. Also complains of associated abdominal cramping sensation intensity 4/10 nonradiating no worsening or relieving factors. Denies further symptomatology at this time  PAST MEDICAL HISTORY:   Past Medical History  Diagnosis Date  . Sleep apnea   . GERD (gastroesophageal reflux disease)   . Hyperlipidemia   . Depression   . Anxiety   . PTSD (post-traumatic stress disorder)   . Obesity   . Anemia   . Vitamin D deficiency   . Nontoxic uninodular goiter     PAST SURGICAL HISTORY:   Past Surgical History  Procedure Laterality Date  . Cesarean section    . Esophagogastroduodenoscopy (egd) with propofol Left 07/07/2015    Procedure: ESOPHAGOGASTRODUODENOSCOPY (EGD) WITH PROPOFOL;  Surgeon: Scot Jun, MD;  Location[facility] ENDOSCOPY;  Service: Endoscopy;  Laterality: Left;  . Cesarean section      SOCIAL HISTORY:   Social History  Substance Use Topics  . Smoking status: Current Some Day Smoker -- 0.50 packs/day    Types: Cigarettes  . Smokeless tobacco: Not on file  . Alcohol Use: Yes     Comment: occasional    FAMILY HISTORY:   Family History  Problem Relation Age of Onset  . Lung cancer Father   . Lung cancer Paternal Aunt   . Bone cancer Paternal Uncle   .  Depression Mother   . Anxiety disorder Mother   . Arthritis Mother   . Hyperlipidemia Mother   . Cancer Father     lung cancer  . Alcohol abuse Father   . Hypertension Maternal Grandmother   . Diabetes Paternal Grandmother   . Heart disease Paternal Grandmother   . Hypertension Paternal Grandmother     DRUG ALLERGIES:  No Known Allergies  REVIEW OF SYSTEMS:  REVIEW OF SYSTEMS:  CONSTITUTIONAL: Denies fevers, chills, positive fatigue, weakness.  EYES: Denies blurred vision, double vision, or eye pain.  EARS, NOSE, THROAT: Denies tinnitus, ear pain, hearing loss.  RESPIRATORY: denies cough, shortness of breath, wheezing  CARDIOVASCULAR: Denies chest pain, palpitations, edema.  GASTROINTESTINAL: Positive nausea, vomiting, abdominal pain.  GENITOURINARY: Denies dysuria, hematuria.  ENDOCRINE: Denies nocturia or thyroid problems. HEMATOLOGIC AND LYMPHATIC: Denies easy bruising or bleeding.  SKIN: Denies rash or lesions.  MUSCULOSKELETAL: Denies pain in neck, back, shoulder, knees, hips, or further arthritic symptoms.  NEUROLOGIC: Denies paralysis, paresthesias.  PSYCHIATRIC: Denies anxiety or depressive symptoms. Otherwise full review of systems performed by me is negative.   MEDICATIONS AT HOME:   Prior to Admission medications   Medication Sig Start Date End Date Taking? Authorizing Provider  alprazolam Prudy Feeler) 2 MG tablet Take 2 mg by mouth 2 (two) times daily.   Yes Historical Provider, MD  amoxicillin (AMOXIL) 500 MG capsule Take 2 capsules (1,000 mg total) by mouth  2 (two) times daily. 07/15/15  Yes Srikar Sudini, MD  citalopram (CELEXA) 20 MG tablet Take 1 tablet (20 mg total) by mouth at bedtime. 07/09/15  Yes Vipul Sherryll Burger, MD  clarithromycin (BIAXIN) 500 MG tablet Take 1 tablet (500 mg total) by mouth 2 (two) times daily. 07/15/15  Yes Srikar Sudini, MD  HYDROcodone-acetaminophen (NORCO) 5-325 MG per tablet Take 1 tablet by mouth every 6 (six) hours as needed for moderate  pain. 07/12/15  Yes Jennye Moccasin, MD  HYDROcodone-acetaminophen (NORCO/VICODIN) 5-325 MG per tablet Take 1 tablet by mouth every 6 (six) hours as needed for moderate pain. 07/16/15  Yes Srikar Sudini, MD  HYDROmorphone (DILAUDID) 2 MG tablet Take 1 tablet (2 mg total) by mouth every 4 (four) hours as needed for severe pain. 07/09/15  Yes Vipul Sherryll Burger, MD  metoCLOPramide (REGLAN) 10 MG tablet Take 1 tablet (10 mg total) by mouth 3 (three) times daily with meals. 07/05/15 07/04/16 Yes Irean Hong, MD  nicotine (NICODERM CQ - DOSED IN MG/24 HR) 7 mg/24hr patch Place 1 patch (7 mg total) onto the skin daily. 07/16/15  Yes Srikar Sudini, MD  pantoprazole (PROTONIX) 40 MG tablet Take 1 tablet (40 mg total) by mouth daily. 07/09/15  Yes Vipul Sherryll Burger, MD  pantoprazole (PROTONIX) 40 MG tablet Take 1 tablet (40 mg total) by mouth 2 (two) times daily before a meal. 07/15/15  Yes Srikar Sudini, MD  promethazine (PHENERGAN) 25 MG suppository Place 1 suppository (25 mg total) rectally every 6 (six) hours as needed for nausea or vomiting. 07/12/15  Yes Jennye Moccasin, MD  QUEtiapine (SEROQUEL) 50 MG tablet Take 1 tablet (50 mg total) by mouth at bedtime. 07/09/15  Yes Vipul Sherryll Burger, MD  sucralfate (CARAFATE) 1 G tablet Take 1 tablet (1 g total) by mouth 4 (four) times daily -  with meals and at bedtime. Dissolve in water and take it. 07/09/15  Yes Vipul Sherryll Burger, MD  traMADol (ULTRAM) 50 MG tablet Take 1 tablet (50 mg total) by mouth every 12 (twelve) hours as needed for moderate pain. 07/09/15  Yes Vipul Sherryll Burger, MD  zolpidem (AMBIEN) 10 MG tablet Take 10 mg by mouth at bedtime.   Yes Historical Provider, MD      VITAL SIGNS:  Blood pressure 133/76, pulse 79, temperature 98.6 F (37 C), temperature source Oral, resp. rate 16, last menstrual period 07/06/2015, SpO2 99 %.  PHYSICAL EXAMINATION:  VITAL SIGNS: Filed Vitals:   07/22/15 0300  BP: 133/76  Pulse: 79  Temp:   Resp:    GENERAL:39 y.o.female currently in no acute distress.   HEAD: Normocephalic, atraumatic.  EYES: Pupils equal, round, reactive to light. Extraocular muscles intact. No scleral icterus.  MOUTH: Moist mucosal membrane. Dentition intact. No abscess noted.  EAR, NOSE, THROAT: Clear without exudates. No external lesions.  NECK: Supple. No thyromegaly. No nodules. No JVD.  PULMONARY: Clear to ascultation, without wheeze rails or rhonci. No use of accessory muscles, Good respiratory effort. good air entry bilaterally CHEST: Nontender to palpation.  CARDIOVASCULAR: S1 and S2. Regular rate and rhythm. No murmurs, rubs, or gallops. No edema. Pedal pulses 2+ bilaterally.  GASTROINTESTINAL: Soft, nontender, nondistended. No masses. Positive bowel sounds. No hepatosplenomegaly.  MUSCULOSKELETAL: No swelling, clubbing, or edema. Range of motion full in all extremities.  NEUROLOGIC: Cranial nerves II through XII are intact. No gross focal neurological deficits. Sensation intact. Reflexes intact.  SKIN: No ulceration, lesions, rashes, or cyanosis. Skin warm and dry. Turgor intact.  PSYCHIATRIC: Mood,  affect within normal limits. The patient is awake, alert and oriented x 3. Insight, judgment intact.    LABORATORY PANEL:   CBC  Recent Labs Lab 07/21/15 2241  WBC 7.8  HGB 12.9  HCT 40.4  PLT 273   ------------------------------------------------------------------------------------------------------------------  Chemistries   Recent Labs Lab 07/15/15 0516  07/21/15 2241  NA 141  < > 142  K 2.9*  < > 3.7  CL 108  < > 109  CO2 27  < > 24  GLUCOSE 103*  < > 107*  BUN 6  < > <5*  CREATININE 0.84  < > 0.72  CALCIUM 8.4*  < > 9.4  MG 1.7  --   --   AST  --   --  25  ALT  --   --  19  ALKPHOS  --   --  67  BILITOT  --   --  0.6  < > = values in this interval not displayed. ------------------------------------------------------------------------------------------------------------------  Cardiac Enzymes No results for input(s): TROPONINI in the  last 168 hours. ------------------------------------------------------------------------------------------------------------------  RADIOLOGY:  No results found.  EKG:   Orders placed or performed during the hospital encounter of 07/05/15  . ED EKG  . ED EKG  . EKG 12-Lead  . EKG 12-Lead  . EKG    IMPRESSION AND PLAN:   39 year old African-American female history of gastritis presenting with intractable nausea and vomiting  1. Intractable nausea/vomiting unspecified: Likely secondary to H. pylori gastritis for which she started taking medications continue her triple treatment, add additional medications for nausea, if remains symptomatic we'll consult gastroenterology will hold off for now 2. Hypokalemia: Replace goal of 4-5 3. Venous thromboembolism prophylactic heparin subcutaneous  All the records are reviewed and case discussed with ED provider. Management plans discussed with the patient, family and they are in agreement.  CODE STATUS: Full  TOTAL TIME TAKING CARE OF THIS PATIENT: 25 minutes.    Hower,  Mardi Mainland.D on 07/22/2015 at 3:16 AM  Between 7am to 6pm - Pager - [phone]  After 6pm: House Pager: - [phone]  [facility]  Office  [phone]  CC: Primary care physician; Baruch Gouty, MD

## 2015-07-23 DIAGNOSIS — F4310 Post-traumatic stress disorder, unspecified: Secondary | ICD-10-CM | POA: Diagnosis present

## 2015-07-23 DIAGNOSIS — E041 Nontoxic single thyroid nodule: Secondary | ICD-10-CM | POA: Diagnosis present

## 2015-07-23 DIAGNOSIS — K219 Gastro-esophageal reflux disease without esophagitis: Secondary | ICD-10-CM | POA: Diagnosis present

## 2015-07-23 DIAGNOSIS — E876 Hypokalemia: Secondary | ICD-10-CM | POA: Diagnosis present

## 2015-07-23 DIAGNOSIS — B9681 Helicobacter pylori [H. pylori] as the cause of diseases classified elsewhere: Secondary | ICD-10-CM | POA: Diagnosis present

## 2015-07-23 DIAGNOSIS — F419 Anxiety disorder, unspecified: Secondary | ICD-10-CM | POA: Diagnosis present

## 2015-07-23 DIAGNOSIS — Z79891 Long term (current) use of opiate analgesic: Secondary | ICD-10-CM | POA: Diagnosis not present

## 2015-07-23 DIAGNOSIS — R634 Abnormal weight loss: Secondary | ICD-10-CM | POA: Diagnosis present

## 2015-07-23 DIAGNOSIS — E559 Vitamin D deficiency, unspecified: Secondary | ICD-10-CM | POA: Diagnosis present

## 2015-07-23 DIAGNOSIS — G4730 Sleep apnea, unspecified: Secondary | ICD-10-CM | POA: Diagnosis present

## 2015-07-23 DIAGNOSIS — K769 Liver disease, unspecified: Secondary | ICD-10-CM | POA: Diagnosis present

## 2015-07-23 DIAGNOSIS — E669 Obesity, unspecified: Secondary | ICD-10-CM | POA: Diagnosis present

## 2015-07-23 DIAGNOSIS — F17210 Nicotine dependence, cigarettes, uncomplicated: Secondary | ICD-10-CM | POA: Diagnosis present

## 2015-07-23 DIAGNOSIS — K2960 Other gastritis without bleeding: Secondary | ICD-10-CM | POA: Diagnosis present

## 2015-07-23 DIAGNOSIS — Z6831 Body mass index (BMI) 31.0-31.9, adult: Secondary | ICD-10-CM | POA: Diagnosis not present

## 2015-07-23 DIAGNOSIS — F329 Major depressive disorder, single episode, unspecified: Secondary | ICD-10-CM | POA: Diagnosis present

## 2015-07-23 DIAGNOSIS — E785 Hyperlipidemia, unspecified: Secondary | ICD-10-CM | POA: Diagnosis present

## 2015-07-23 DIAGNOSIS — Z79899 Other long term (current) drug therapy: Secondary | ICD-10-CM | POA: Diagnosis not present

## 2015-07-23 DIAGNOSIS — R1013 Epigastric pain: Secondary | ICD-10-CM | POA: Diagnosis not present

## 2015-07-23 LAB — BASIC METABOLIC PANEL
ANION GAP: 8 (ref 5–15)
BUN: <5 mg/dL — ABNORMAL LOW (ref 6–20)
CALCIUM: 8.9 mg/dL (ref 8.9–10.3)
CO2: 26 mmol/L (ref 22–32)
Chloride: 104 mmol/L (ref 101–111)
Creatinine, Ser: 0.58 mg/dL (ref 0.44–1.00)
GFR calc non Af Amer: >60 mL/min (ref >60)
GLUCOSE: 99 mg/dL (ref 65–99)
POTASSIUM: 3.3 mmol/L — AB (ref 3.5–5.1)
Sodium: 138 mmol/L (ref 135–145)

## 2015-07-23 LAB — TSH: TSH: 0.568 u[IU]/mL (ref 0.350–4.500)

## 2015-07-23 MED ORDER — POTASSIUM CHLORIDE 20 MEQ/15ML (10%) PO SOLN
40.0000 meq | Freq: Once | ORAL | Status: AC
  Administered 2015-07-23: 13:00:00 40 meq via ORAL
  Filled 2015-07-23: qty 30

## 2015-07-23 MED ORDER — PANTOPRAZOLE SODIUM 40 MG IV SOLR
40.0000 mg | Freq: Two times a day (BID) | INTRAVENOUS | Status: DC
  Administered 2015-07-23 – 2015-07-24 (×3): 40 mg via INTRAVENOUS
  Filled 2015-07-23 (×3): qty 40

## 2015-07-23 MED ORDER — CALCIUM CARBONATE ANTACID 500 MG PO CHEW
1.0000 | CHEWABLE_TABLET | Freq: Two times a day (BID) | ORAL | Status: DC
  Administered 2015-07-23 – 2015-07-26 (×7): 200 mg via ORAL
  Filled 2015-07-23 (×7): qty 1

## 2015-07-23 MED ORDER — BOOST / RESOURCE BREEZE PO LIQD
1.0000 | Freq: Three times a day (TID) | ORAL | Status: DC
  Administered 2015-07-23 – 2015-07-26 (×9): 1 via ORAL

## 2015-07-23 MED ORDER — HYDROMORPHONE HCL 1 MG/ML IJ SOLN
1.0000 mg | INTRAMUSCULAR | Status: DC | PRN
  Administered 2015-07-23 – 2015-07-26 (×14): 1 mg via INTRAVENOUS
  Filled 2015-07-23 (×14): qty 1

## 2015-07-23 MED ORDER — BOOST / RESOURCE BREEZE PO LIQD: 1.0000 | Freq: Three times a day (TID) | ORAL | Status: DC

## 2015-07-23 NOTE — Progress Notes (Signed)
Initial Nutrition Assessment   INTERVENTION:   Medical Food Supplement Therapy: recommend Boost Breeze po TID, each supplement provides 250 kcal and 9 grams of protein   NUTRITION DIAGNOSIS:   Inadequate oral intake related to altered GI function as evidenced by  (NPO/CL diet order with n/v).  GOAL:   Patient will meet greater than or equal to 90% of their needs  MONITOR:    (Energy Intake, Digestive System, Electrolyte and renal Profile, Anthropometrics)  REASON FOR ASSESSMENT:   Malnutrition Screening Tool    ASSESSMENT:   Pt admitted with n/v secondary to gastritis or cyclic vomitting syndrome. Pt with multiple discharges for the same. Pt very sleepy on visit this am, difficult to keep awake for conversation.  Past Medical History  Diagnosis Date  . Sleep apnea   . GERD (gastroesophageal reflux disease)   . Hyperlipidemia   . Depression   . Anxiety   . PTSD (post-traumatic stress disorder)   . Obesity   . Anemia   . Vitamin D deficiency   . Nontoxic uninodular goiter     Diet Order:  Diet clear liquid Room service appropriate?: Yes; Fluid consistency:: Thin    Current Nutrition: Pt reports bites of popsicle, 0% recorded.  Food/Nutrition-Related History: Pt reports last eating night before last and having a full meal without problems before the vomitting starts again. Difficult to clarify further back than that as pt falling back asleep. On last admission writer recalls, pt reported to RD eating a big meal after previous discharge too.   Medications:   Electrolyte/Renal Profile and Glucose Profile:   Recent Labs Lab 07/21/15 2241 07/22/15 0459 07/23/15 0405  NA 142 142 138  K 3.7 3.8 3.3*  CL 109 111 104  CO2 BUN <5* <5* <5*  CREATININE 0.72 0.60 0.58  CALCIUM 9.4 8.4* 8.9  GLUCOSE 107* 139* 99   Protein Profile:  Recent Labs Lab 07/21/15 2241  ALBUMIN 4.3    Gastrointestinal Profile: Last BM:  07/21/2015, +n/v  overnight   Nutrition-Focused Physical Exam Findings:  Unable to complete Nutrition-Focused physical exam at this time.    Weight Change: Pt with weight loss per CHL of 7% in the past month.   Skin:  Reviewed, no issues  Height:   Ht Readings from Last 1 Encounters:  07/22/15  (1.6 m)    Weight:   Wt Readings from Last 1 Encounters:  07/22/15 175 lb 9.6 oz (79.652 kg)    Wt Readings from Last 10 Encounters:  07/22/15 175 lb 9.6 oz (79.652 kg)  07/13/15 172 lb (78.019 kg)  07/13/15 186 lb (84.369 kg)  07/12/15 181 lb (82.101 kg)  07/04/15 181 lb (82.101 kg)  06/01/15 189 lb (85.73 kg)  03/31/15 189 lb (85.73 kg)  03/20/15 189 lb (85.73 kg)    Ideal Body Weight:    52.3kg  BMI:  Body mass index is 31.11 kg/(m^2).  Estimated Nutritional Needs:   Kcal:  using IBW of 52.3kg, BEE: 1177kcals, TEE: (IF 1.1-1.3)(AF 1.2) 1553-1836kcals  Protein:  42-52g protein (0.8-1.0g/kg)   Fluid:  1308-1514mL of fluid (25-74mL/kg)  EDUCATION NEEDS:   Education needs no appropriate at this time  MODERATE Care Level   Leda Quail, RD, LDN Pager [phone]

## 2015-07-23 NOTE — Plan of Care (Addendum)
Problem: Discharge Progression Outcomes Goal: Other Discharge Outcomes/Goals Outcome: Progressing Plan of care progress: -continues IV NS -unable to tolerate clear liquids this morning but pt has been able to tolerate some clear liquids on her tray this afternoon, plan to discharge once able to tolerate diet -continues ABT -PRN pain and nausea meds with effectiveness

## 2015-07-23 NOTE — Plan of Care (Signed)
Problem: Discharge Progression Outcomes Goal: Other Discharge Outcomes/Goals Outcome: Progressing Plan of Care Progress to Goal:   Pt has vomited x2 during shift and has reported pain x2. Pt has hiccups intermittently, MD paged and spoke with Dr. Sheryle Hail. Tums was ordered and administered to pt. No other signs of distress noted. Will continue to monitor.

## 2015-07-23 NOTE — Progress Notes (Signed)
[facility] - [place] at [facility]   PATIENT NAME: Laurie Frederick    MR#:  [id number]  DATE OF BIRTH:  1975-11-21  SUBJECTIVE:  Patient still on able to take in by mouth. Daily with nausea and abdominal pain. Very tearful this morning.  REVIEW OF SYSTEMS:    Review of Systems  Constitutional: Positive for weight loss and malaise/fatigue. Negative for fever and chills.  HENT: Negative for sore throat.   Eyes: Negative for blurred vision, double vision and pain.  Respiratory: Negative for cough, hemoptysis, shortness of breath and wheezing.   Cardiovascular: Negative for chest pain, palpitations, orthopnea and leg swelling.  Gastrointestinal: Positive for nausea, vomiting and abdominal pain. Negative for heartburn, diarrhea and constipation.  Genitourinary: Negative for dysuria and hematuria.  Musculoskeletal: Negative for back pain and joint pain.  Skin: Negative for rash.  Neurological: Negative for sensory change, speech change, focal weakness and headaches.  Endo/Heme/Allergies: Does not bruise/bleed easily.  Psychiatric/Behavioral: Negative for depression. The patient is not nervous/anxious.       DRUG ALLERGIES:  No Known Allergies  VITALS:  Blood pressure 174/87, pulse 67, temperature 98.3 F (36.8 C), temperature source Oral, resp. rate 17, height  (1.6 m), weight 79.652 kg (175 lb 9.6 oz), last menstrual period 07/06/2015, SpO2 100 %.  PHYSICAL EXAMINATION:   Physical Exam  Constitutional: She is oriented to person, place, and time and well-developed, well-nourished, and in no distress. No distress.  HENT:  Head: Normocephalic.  Eyes: No scleral icterus.  Neck: Normal range of motion. Neck supple. No JVD present. No tracheal deviation present.  Cardiovascular: Normal rate, regular rhythm and normal heart sounds.  Exam reveals no gallop and no friction rub.   No murmur heard. Pulmonary/Chest: Effort normal and breath sounds normal.  No respiratory distress. She has no wheezes. She has no rales. She exhibits no tenderness.  Abdominal: Soft. Bowel sounds are normal. She exhibits no distension and no mass. There is tenderness (Mid epigastric region). There is no rebound and no guarding.  Musculoskeletal: Normal range of motion. She exhibits no edema.  Neurological: She is alert and oriented to person, place, and time.  Skin: Skin is warm. No rash noted. No erythema.  Psychiatric: Affect and judgment normal.       LABORATORY PANEL:   CBC  Recent Labs Lab 07/22/15 0459  WBC 5.2  HGB 11.1*  HCT 34.1*  PLT 244   ------------------------------------------------------------------------------------------------------------------  Chemistries   Recent Labs Lab 07/21/15 2241  07/23/15 0405  NA 142  < > 138  K 3.7  < > 3.3*  CL 109  < > 104  CO2 24  < > 26  GLUCOSE 107*  < > 99  BUN <5*  < > <5*  CREATININE 0.72  < > 0.58  CALCIUM 9.4  < > 8.9  AST 25  --   --   ALT 19  --   --   ALKPHOS 67  --   --   BILITOT 0.6  --   --   < > = values in this interval not displayed. ------------------------------------------------------------------------------------------------------------------  Cardiac Enzymes No results for input(s): TROPONINI in the last 168 hours. ------------------------------------------------------------------------------------------------------------------  RADIOLOGY:  US Abdomen Complete  07/22/2015   CLINICAL DATA:  Weight loss.  Epigastric pain.  EXAM: ULTRASOUND ABDOMEN COMPLETE  COMPARISON:  CT 07/05/2015 ; MRI 02/04/2014.  FINDINGS: Gallbladder: 9 mm non mobile echogenic focus within the gallbladder lumen potentially representing nonmobile stone or polyp.  No gallbladder wall thickening. No pericholecystic fluid. Negative sonographic Murphy sign.  Common bile duct: Diameter: 2 mm  Liver: Within anterior right hepatic lobe there is a 1.5 x 1.1 x 1.3 cm hypoechoic lesion. Additionally within  the inferior right hepatic lobe there is a 2.1 x 2.3 x 1.7 cm hypoechoic lesion. These lesions are poorly visualized and characterized on current exam.  IVC: No abnormality visualized.  Pancreas: Visualized portion unremarkable.  Spleen: Size and appearance within normal limits.  Right Kidney: Length: 12.4 cm. Echogenicity within normal limits. No mass or hydronephrosis visualized.  Left Kidney: Length: 11.1 cm. Echogenicity within normal limits. No mass or hydronephrosis visualized.  Abdominal aorta: No aneurysm visualized.  Other findings: None.  IMPRESSION: Two hypoechoic hepatic lesions as described above. Patient had prior abdominal MRI 02/04/2014 for evaluation of these lesions, one lesion which was indeterminate in etiology. On this MRI it was recommended that the patient have a six-month follow-up MRI evaluation to demonstrate stability ; therefore, a follow-up evaluation with MRI (pre and post contrast) for comparison with prior MRI is recommended.  Polyp versus stone within the gallbladder lumen. Recommend follow-up gallbladder ultrasound in 12 months.   Electronically Signed   By: Annia Belt M.D.   On: 07/22/2015 13:08     ASSESSMENT AND PLAN:   39 year old female recently diagnosed with H. pylori gastritis who presents with persistent nausea and vomiting as well as weight loss.   1. Intractable Nausea and vomiting: This is secondary to severe gastritis. Patient underwent EGD during her hospitalization in early September which showed severe gastritis and H. pylori positive. As per GI consultation this will take time to heal. Continue supportive care with antiemetics and IV fluids. She is also taking pain medications for her midepigastric abdominal pain which is likely also contributing to her nausea and vomiting. I did discuss this with the patient. I will change Protonix to IV since she is not really taking in oral medications. Continue Carafate.  2. Weight loss: Patient had had a recent CT of  the abdomen shows no acute etiology/masses.3. H. pylori gastritis: Continue triple treatment. Patient is considering HIV test. I will order TSH  3. Hepatic lesions on abdominal ultrasound: Patient will need a follow-up MRI in October. She also has a gallbladders polyp which will need a follow-up ultrasound in a year  4. Hypokalemia: This will be repleted. This is secondary to her nausea and vomiting.  5. Tobacco dependence: Patient was encouraged to stop smoking. Patient was counseled for 3 minutes.   6. H. pylori: Continue triple therapy.   Management plans discussed with the patient and she is  in agreement.  CODE STATUS: FULL    TOTAL TIME TAKING CARE OF THIS PATIENT: 25 minutes.   POSSIBLE D/C IN 2-3DAYS, DEPENDING ON CLINICAL CONDITION.   MODY, SITAL M.D on 07/23/2015 at 11:37 AM  Between 7am to 6pm - Pager - [phone]  After 6pm go to [email or web address] - password [email or web address] [facility]  [facility]  Office  [phone]  CC: Primary care physician; Baruch Gouty, MD

## 2015-07-23 NOTE — Progress Notes (Signed)
Dr Juliene Pina made aware that pt states oxycodone and morphine not effective enough for pain control, pt states that last admission dilaudid worked better, new order dilaudid  every 4 hours as needed for pain, also mentioned that pt continues to have emesis throughout shift and some concern related to the PO abts, MD states that protonix changed to IV today in hopes to improve nausea vomiting, will reevaluate tomorrow

## 2015-07-24 LAB — BASIC METABOLIC PANEL
Anion gap: 5 (ref 5–15)
CHLORIDE: 106 mmol/L (ref 101–111)
CO2: 28 mmol/L (ref 22–32)
Calcium: 8.8 mg/dL — ABNORMAL LOW (ref 8.9–10.3)
Creatinine, Ser: 0.7 mg/dL (ref 0.44–1.00)
GFR calc Af Amer: >60 mL/min (ref >60)
GFR calc non Af Amer: >60 mL/min (ref >60)
GLUCOSE: 90 mg/dL (ref 65–99)
POTASSIUM: 3.5 mmol/L (ref 3.5–5.1)
Sodium: 139 mmol/L (ref 135–145)

## 2015-07-24 NOTE — Progress Notes (Signed)
[facility] - [place] at [facility]   PATIENT NAME: Laurie Frederick    MR#:  [id number]  DATE OF BIRTH:  July 02, 1976  SUBJECTIVE:  The patient tolerated clear liquid diet, has nausea and abdominal pain. But no vomiting. She wanted to add advance diet.  REVIEW OF SYSTEMS:    Review of Systems  Constitutional: Positive for weight loss and malaise/fatigue. Negative for fever and chills.  HENT: Negative for sore throat.   Eyes: Negative for blurred vision, double vision and pain.  Respiratory: Negative for cough, hemoptysis, shortness of breath and wheezing.   Cardiovascular: Negative for chest pain, palpitations, orthopnea and leg swelling.  Gastrointestinal: Positive for nausea and abdominal pain. Negative for heartburn, vomiting, diarrhea and constipation.  Genitourinary: Negative for dysuria and hematuria.  Musculoskeletal: Negative for back pain and joint pain.  Skin: Negative for rash.  Neurological: Negative for sensory change, speech change, focal weakness and headaches.  Endo/Heme/Allergies: Does not bruise/bleed easily.  Psychiatric/Behavioral: Negative for depression. The patient is not nervous/anxious.       DRUG ALLERGIES:  No Known Allergies  VITALS:  Blood pressure 134/79, pulse 73, temperature 97.7 F (36.5 C), temperature source Oral, resp. rate 20, height  (1.6 m), weight 79.652 kg (175 lb 9.6 oz), last menstrual period 07/06/2015, SpO2 100 %.  PHYSICAL EXAMINATION:   Physical Exam  Constitutional: She is oriented to person, place, and time and well-developed, well-nourished, and in no distress. No distress.  HENT:  Head: Normocephalic.  Eyes: No scleral icterus.  Neck: Normal range of motion. Neck supple. No JVD present. No tracheal deviation present.  Cardiovascular: Normal rate, regular rhythm and normal heart sounds.  Exam reveals no gallop and no friction rub.   No murmur heard. Pulmonary/Chest: Effort normal and breath  sounds normal. No respiratory distress. She has no wheezes. She has no rales. She exhibits no tenderness.  Abdominal: Soft. Bowel sounds are normal. She exhibits no distension and no mass. There is tenderness (Mid epigastric region). There is no rebound and no guarding.  Musculoskeletal: Normal range of motion. She exhibits no edema.  Neurological: She is alert and oriented to person, place, and time.  Skin: Skin is warm. No rash noted. No erythema.  Psychiatric: Affect and judgment normal.       LABORATORY PANEL:   CBC  Recent Labs Lab 07/22/15 0459  WBC 5.2  HGB 11.1*  HCT 34.1*  PLT 244   ------------------------------------------------------------------------------------------------------------------  Chemistries   Recent Labs Lab 07/21/15 2241  07/24/15 0506  NA 142  < > 139  K 3.7  < > 3.5  CL 109  < > 106  CO2 24  < > 28  GLUCOSE 107*  < > 90  BUN <5*  < > <5*  CREATININE 0.72  < > 0.70  CALCIUM 9.4  < > 8.8*  AST 25  --   --   ALT 19  --   --   ALKPHOS 67  --   --   BILITOT 0.6  --   --   < > = values in this interval not displayed. ------------------------------------------------------------------------------------------------------------------  Cardiac Enzymes No results for input(s): TROPONINI in the last 168 hours. ------------------------------------------------------------------------------------------------------------------  RADIOLOGY:  No results found.   ASSESSMENT AND PLAN:   39 year old female recently diagnosed with H. pylori gastritis who presents with persistent nausea and vomiting as well as weight loss.   1. Intractable Nausea and vomiting: This is secondary to severe gastritis. Patient underwent EGD  during her hospitalization in early September which showed severe gastritis and H. pylori positive. As per GI consultation this will take time to heal. Continue supportive care with antiemetics and IV fluids, continue protonix and  Carafate. Advance to full liquid diet.  2. Weight loss: Patient had had a recent CT of the abdomen shows no acute etiology/masses.3. H. pylori gastritis: Continue triple treatment.  3. Hepatic lesions on abdominal ultrasound: Patient will need a follow-up MRI in October. She also has a gallbladders polyp which will need a follow-up ultrasound in a year  4. Hypokalemia: repleted and improved.  5. Tobacco dependence: Patient was encouraged to stop smoking. Patient was counseled for 3 minutes.   6. H. pylori: Continue triple therapy.  I discussed with the patient about current condition,  treatment and discharge plan. I answered all the questions (>12 questions listed by the patient) the patient had.  Management plans discussed with the patient and she is  in agreement.  CODE STATUS: FULL    TOTAL TIME TAKING CARE OF THIS PATIENT: 52 minutes.   POSSIBLE D/C IN 2 DAYS, DEPENDING ON CLINICAL CONDITION.   Shaune Pollack M.D on 07/24/2015 at 1:19 PM  Between 7am to 6pm - Pager - [phone]  After 6pm go to [email or web address] - password [email or web address]  [address] [facility]  Office  [phone]  CC: Primary care physician; Baruch Gouty, MD

## 2015-07-24 NOTE — Progress Notes (Signed)
Ask Dr Imogene Burn if pt. Could go outside for a few minutes a couple of times per day.  Dr Imogene Burn stated that he pt. Could not go outside.

## 2015-07-24 NOTE — Plan of Care (Signed)
Problem: Discharge Progression Outcomes Goal: Other Discharge Outcomes/Goals Outcome: Progressing VSS. IVF and ABX continues. Dilaudid givenx2 for epigastric pain with improvement. No c/o n/v, continues scheduled nausea meds. Pt is tolerating clear liquid diet. Diet advanced to full liquid.

## 2015-07-24 NOTE — Plan of Care (Signed)
Problem: Discharge Progression Outcomes Goal: Other Discharge Outcomes/Goals Outcome: Progressing Plan of Care Progress to Goal:   Pt has report nausea once during shift and was administered pain meds x2. Pt has been resting comfortably during shift. No emesis. Pt had concerns about [place] that reveal liver lesions. Pt encouraged to write questions and ask MD in morning. Pt is hoping her diet will advance today.  No other signs of distress noted. Will continue to monitor.

## 2015-07-25 MED ORDER — PANTOPRAZOLE SODIUM 40 MG PO TBEC
40.0000 mg | DELAYED_RELEASE_TABLET | Freq: Two times a day (BID) | ORAL | Status: DC
  Administered 2015-07-25 – 2015-07-26 (×3): 40 mg via ORAL
  Filled 2015-07-25 (×3): qty 1

## 2015-07-25 NOTE — Plan of Care (Signed)
Problem: Discharge Progression Outcomes Goal: Other Discharge Outcomes/Goals Outcome: Progressing Plan of Care Progress to Goal:   Pt has reported pain multiple times during shift. No report of nausea this shift. Pt started her menses today. Pt is tolerating diet well. No other signs of distress noted. Will continue to monitor.

## 2015-07-25 NOTE — Plan of Care (Signed)
Problem: Discharge Progression Outcomes Goal: Other Discharge Outcomes/Goals Outcome: Progressing VSS. IVF d/c. Epigastric pain managed with dilaudid during the shift. No c/o n/v. Advanced to soft diet, tolerates it well.

## 2015-07-25 NOTE — Progress Notes (Signed)
IV to PO:   39 yo female on Protonix  IV Q12H.  Patient is eating and taking other meds by mouth.  Will transition to Protonix  PO BID as per policy.    Stormy Card, [facility] 07/25/2015

## 2015-07-25 NOTE — Progress Notes (Addendum)
[facility] - [place] at [facility]   PATIENT NAME: Laurie Frederick    MR#:  [id number]  DATE OF BIRTH:  Sep 23, 1976  SUBJECTIVE:  The patient tolerated full liquid diet, no nausea or vomitting but has abdominal pain.  REVIEW OF SYSTEMS:    Review of Systems  Constitutional: Positive for malaise/fatigue. Negative for fever, chills and weight loss.  HENT: Negative for sore throat.   Eyes: Negative for blurred vision, double vision and pain.  Respiratory: Negative for cough, hemoptysis, shortness of breath and wheezing.   Cardiovascular: Negative for chest pain, palpitations, orthopnea and leg swelling.  Gastrointestinal: Positive for abdominal pain. Negative for heartburn, nausea, vomiting, diarrhea and constipation.  Genitourinary: Negative for dysuria and hematuria.  Musculoskeletal: Negative for back pain and joint pain.  Skin: Negative for rash.  Neurological: Negative for sensory change, speech change, focal weakness and headaches.  Endo/Heme/Allergies: Does not bruise/bleed easily.  Psychiatric/Behavioral: Negative for depression. The patient is not nervous/anxious.       DRUG ALLERGIES:  No Known Allergies  VITALS:  Blood pressure 137/60, pulse 50, temperature 97.9 F (36.6 C), temperature source Oral, resp. rate 18, height  (1.6 m), weight 79.652 kg (175 lb 9.6 oz), last menstrual period 07/06/2015, SpO2 100 %.  PHYSICAL EXAMINATION:   Physical Exam  Constitutional: She is oriented to person, place, and time and well-developed, well-nourished, and in no distress. No distress.  HENT:  Head: Normocephalic.  Eyes: No scleral icterus.  Neck: Normal range of motion. Neck supple. No JVD present. No tracheal deviation present.  Cardiovascular: Normal rate, regular rhythm and normal heart sounds.  Exam reveals no gallop and no friction rub.   No murmur heard. Pulmonary/Chest: Effort normal and breath sounds normal. No respiratory distress. She  has no wheezes. She has no rales. She exhibits no tenderness.  Abdominal: Soft. Bowel sounds are normal. She exhibits no distension and no mass. There is tenderness (Mid epigastric region). There is no rebound and no guarding.  Musculoskeletal: Normal range of motion. She exhibits no edema.  Neurological: She is alert and oriented to person, place, and time.  Skin: Skin is warm. No rash noted. No erythema.  Psychiatric: Affect and judgment normal.       LABORATORY PANEL:   CBC  Recent Labs Lab 07/22/15 0459  WBC 5.2  HGB 11.1*  HCT 34.1*  PLT 244   ------------------------------------------------------------------------------------------------------------------  Chemistries   Recent Labs Lab 07/21/15 2241  07/24/15 0506  NA 142  < > 139  K 3.7  < > 3.5  CL 109  < > 106  CO2 24  < > 28  GLUCOSE 107*  < > 90  BUN <5*  < > <5*  CREATININE 0.72  < > 0.70  CALCIUM 9.4  < > 8.8*  AST 25  --   --   ALT 19  --   --   ALKPHOS 67  --   --   BILITOT 0.6  --   --   < > = values in this interval not displayed. ------------------------------------------------------------------------------------------------------------------  Cardiac Enzymes No results for input(s): TROPONINI in the last 168 hours. ------------------------------------------------------------------------------------------------------------------  RADIOLOGY:  No results found.   ASSESSMENT AND PLAN:   39 year old female recently diagnosed with H. pylori gastritis who presents with persistent nausea and vomiting as well as weight loss.   1. Intractable Nausea and vomiting: This is secondary to severe  H. pylori gastritis.  Better.  Patient underwent EGD during her  hospitalization in early September which showed severe gastritis and H. pylori positive. As per GI consultation this will take time to heal. Continue supportive care with antiemetics and IV fluids, continue protonix and Carafate. Advance to soft  diet. Continue triple treatment.  2. Weight loss: Patient had had a recent CT of the abdomen shows no acute etiology/masses.  3. Hepatic lesions on abdominal ultrasound: Patient will need a follow-up MRI in October. She also has a gallbladders polyp which will need a follow-up ultrasound in a year  4. Hypokalemia: repleted and improved.  5. Tobacco dependence: Patient was encouraged to stop smoking. Patient was counseled for 3 minutes.   6. H. pylori: Continue triple therapy.  I discussed with the patient about current condition,  treatment and discharge plan.   Management plans discussed with the patient and she is  in agreement.  CODE STATUS: FULL    TOTAL TIME TAKING CARE OF THIS PATIENT: 52 minutes.   POSSIBLE D/C IN 1 DAYS, DEPENDING ON CLINICAL CONDITION.   Shaune Pollack M.D on 07/25/2015 at 1:12 PM  Between 7am to 6pm - Pager - [phone]  After 6pm go to [email or web address] - password [email or web address] [facility]  [place] [facility]  Office  [phone]  CC: Primary care physician; Baruch Gouty, MD

## 2015-07-26 ENCOUNTER — Inpatient Hospital Stay: Payer: Self-pay | Admitting: Family Medicine

## 2015-07-26 LAB — CBC
HEMATOCRIT: 33.9 % — AB (ref 35.0–47.0)
Hemoglobin: 11.3 g/dL — ABNORMAL LOW (ref 12.0–16.0)
MCH: 29.7 pg (ref 26.0–34.0)
MCHC: 33.3 g/dL (ref 32.0–36.0)
MCV: 89.3 fL (ref 80.0–100.0)
PLATELETS: 211 10*3/uL (ref 150–440)
RBC: 3.8 MIL/uL (ref 3.80–5.20)
RDW: 14.4 % (ref 11.5–14.5)
WBC: 4.1 10*3/uL (ref 3.6–11.0)

## 2015-07-26 MED ORDER — OXYCODONE-ACETAMINOPHEN 5-325 MG PO TABS
1.0000 | ORAL_TABLET | Freq: Three times a day (TID) | ORAL | Status: DC | PRN
  Administered 2015-07-26: 1 via ORAL
  Filled 2015-07-26: qty 1

## 2015-07-26 NOTE — Discharge Instructions (Signed)
Abdominal Pain °Many things can cause abdominal pain. Usually, abdominal pain is not caused by a disease and will improve without treatment. It can often be observed and treated at home. Your health care provider will do a physical exam and possibly order blood tests and X-rays to help determine the seriousness of your pain. However, in many cases, more time must pass before a clear cause of the pain can be found. Before that point, your health care provider may not know if you need more testing or further treatment. °HOME CARE INSTRUCTIONS  °Monitor your abdominal pain for any changes. The following actions may help to alleviate any discomfort you are experiencing: °· Only take over-the-counter or prescription medicines as directed by your health care provider. °· Do not take laxatives unless directed to do so by your health care provider. °· Try a clear liquid diet (broth, tea, or water) as directed by your health care provider. Slowly move to a bland diet as tolerated. °SEEK MEDICAL CARE IF: °· You have unexplained abdominal pain. °· You have abdominal pain associated with nausea or diarrhea. °· You have pain when you urinate or have a bowel movement. °· You experience abdominal pain that wakes you in the night. °· You have abdominal pain that is worsened or improved by eating food. °· You have abdominal pain that is worsened with eating fatty foods. °· You have a fever. °SEEK IMMEDIATE MEDICAL CARE IF:  °· Your pain does not go away within 2 hours. °· You keep throwing up (vomiting). °· Your pain is felt only in portions of the abdomen, such as the right side or the left lower portion of the abdomen. °· You pass bloody or black tarry stools. °MAKE SURE YOU: °· Understand these instructions.   °· Will watch your condition.   °· Will get help right away if you are not doing well or get worse.   °Document Released: 07/26/2005 Document Revised: 10/21/2013 Document Reviewed: 06/25/2013 °[employer]® Patient Information  ©2015 [employer]. This information is not intended to replace advice given to you by your health care provider. Make sure you discuss any questions you have with your health care provider. ° °Gastritis, Adult °Gastritis is soreness and swelling (inflammation) of the lining of the stomach. Gastritis can develop as a sudden onset (acute) or long-term (chronic) condition. If gastritis is not treated, it can lead to stomach bleeding and ulcers. °CAUSES  °Gastritis occurs when the stomach lining is weak or damaged. Digestive juices from the stomach then inflame the weakened stomach lining. The stomach lining may be weak or damaged due to viral or bacterial infections. One common bacterial infection is the Helicobacter pylori infection. Gastritis can also result from excessive alcohol consumption, taking certain medicines, or having too much acid in the stomach.  °SYMPTOMS  °In some cases, there are no symptoms. When symptoms are present, they may include: °· Pain or a burning sensation in the upper abdomen. °· Nausea. °· Vomiting. °· An uncomfortable feeling of fullness after eating. °DIAGNOSIS  °Your caregiver may suspect you have gastritis based on your symptoms and a physical exam. To determine the cause of your gastritis, your caregiver may perform the following: °· Blood or stool tests to check for the H pylori bacterium. °· Gastroscopy. A thin, flexible tube (endoscope) is passed down the esophagus and into the stomach. The endoscope has a light and camera on the end. Your caregiver uses the endoscope to view the inside of the stomach. °· Taking a tissue sample (biopsy)   from the stomach to examine under a microscope. TREATMENT  Depending on the cause of your gastritis, medicines may be prescribed. If you have a bacterial infection, such as an H pylori infection, antibiotics may be given. If your gastritis is caused by too much acid in the stomach, H2 blockers or antacids may be given. Your caregiver may  recommend that you stop taking aspirin, ibuprofen, or other nonsteroidal anti-inflammatory drugs (NSAIDs). HOME CARE INSTRUCTIONS  Only take over-the-counter or prescription medicines as directed by your caregiver.  If you were given antibiotic medicines, take them as directed. Finish them even if you start to feel better.  Drink enough fluids to keep your urine clear or pale yellow.  Avoid foods and drinks that make your symptoms worse, such as:  Caffeine or alcoholic drinks.  Chocolate.  Peppermint or mint flavorings.  Garlic and onions.  Spicy foods.  Citrus fruits, such as oranges, lemons, or limes.  Tomato-based foods such as sauce, chili, salsa, and pizza.  Fried and fatty foods.  Eat small, frequent meals instead of large meals. SEEK IMMEDIATE MEDICAL CARE IF:   You have black or dark red stools.  You vomit blood or material that looks like coffee grounds.  You are unable to keep fluids down.  Your abdominal pain gets worse.  You have a fever.  You do not feel better after 1 week.  You have any other questions or concerns. MAKE SURE YOU:  Understand these instructions.  Will watch your condition.  Will get help right away if you are not doing well or get worse. Document Released: 10/10/2001 Document Revised: 04/16/2012 Document Reviewed: 11/29/2011 [facility] Patient Information 2015 [place], [place]. This information is not intended to replace advice given to you by your health care provider. Make sure you discuss any questions you have with your health care provider.  Follow up Dr. Markham Jordan for hepatic lesion.

## 2015-07-26 NOTE — Plan of Care (Signed)
Problem: Discharge Progression Outcomes Goal: Other Discharge Outcomes/Goals Outcome: Completed/Met Date Met:  07/26/15 Continues with abdominal and back pain, relieved by Dilaudid and Reglan. States tolerating diet better. Urinating well. Last BM 2 days ago. Denies any nausea or vomiting. Up with assistance or up ad lib. Seen by Dr, Bridgett Larsson who has planned discharge for her today. Gave po Percocet and tolerated well. Discharged at 12:11am with all belongings, accompanied by mother. No complaints of pain at time of discharge. Reviewed all discharge instructions and demonstrated understanding.

## 2015-07-26 NOTE — Plan of Care (Signed)
Problem: Discharge Progression Outcomes Goal: Other Discharge Outcomes/Goals Outcome: Progressing Plan of care progress to goals: 1. C/o epigastric pain relieved by PRN Dilaudid Q4H throughout shift  2. Hemodynamically:             -VSS, afebrile              -no c/o nausea or emesis  3. Tolerating soft foods diet, pt stated she felt very hungry tonight & able to tolerate popsicle w/o problem  4. +1 assist to bathroom for safety. Bed alarm on, hourly rounds. Pt understands how to use call system for assistance

## 2015-07-26 NOTE — Discharge Summary (Signed)
[facility] [place] at [facility]   PATIENT NAME: Laurie Frederick    MR#:  [id number]  DATE OF BIRTH:  Mar 23, 1976  DATE OF ADMISSION:  07/21/2015 ADMITTING PHYSICIAN: Wyatt Haste, MD  DATE OF DISCHARGE: 07/26/2015 12:17 PM  PRIMARY CARE PHYSICIAN: Baruch Gouty, MD    ADMISSION DIAGNOSIS:  Epigastric pain [R10.13] Helicobacter positive gastritis [B96.81] Intractable vomiting with nausea, vomiting of unspecified type [R11.10]   DISCHARGE DIAGNOSIS:   Intractable Nausea and vomiting secondary to severe H. pylori gastritis SECONDARY DIAGNOSIS:   Past Medical History  Diagnosis Date  . Sleep apnea   . GERD (gastroesophageal reflux disease)   . Hyperlipidemia   . Depression   . Anxiety   . PTSD (post-traumatic stress disorder)   . Obesity   . Anemia   . Vitamin D deficiency   . Nontoxic uninodular goiter     HOSPITAL COURSE:   1. Intractable Nausea and vomiting, secondary to severe H. pylori gastritis. Improved.  Patient underwent EGD during her hospitalization in early September which showed severe gastritis and H. pylori positive. As per GI consultation this will take time to heal. She has been treated with with antiemetics, IV fluids, protonix and Carafate. Tolerated soft diet. Continue triple treatment.  2. Weight loss: Patient had had a recent CT of the abdomen shows no acute etiology/masses.  3. Hepatic lesions on abdominal ultrasound: Patient will need a follow-up MRI in October. She also has a gallbladders polyp which will need a follow-up ultrasound in a year. Follow-up GI physician as outpatient.  4. Hypokalemia: repleted and improved.  5. Tobacco dependence: Patient was encouraged to stop smoking. Patient was counseled for 3 minutes.   6. H. pylori: Continue triple therapy.  DISCHARGE CONDITIONS:   Stable, discharged to home today.  CONSULTS OBTAINED:  Treatment Team:  Shaune Pollack, MD  DRUG ALLERGIES:  No Known  Allergies  DISCHARGE MEDICATIONS:   Discharge Medication List as of 07/26/2015 10:49 AM    CONTINUE these medications which have NOT CHANGED   Details  alprazolam (XANAX) 2 MG tablet Take 2 mg by mouth 2 (two) times daily., Until Discontinued, Historical Med    amoxicillin (AMOXIL) 500 MG capsule Take 2 capsules (1,000 mg total) by mouth 2 (two) times daily., Starting 07/15/2015, Until Discontinued, Normal    citalopram (CELEXA) 20 MG tablet Take 1 tablet (20 mg total) by mouth at bedtime., Starting 07/09/2015, Until Discontinued, Normal    clarithromycin (BIAXIN) 500 MG tablet Take 1 tablet (500 mg total) by mouth 2 (two) times daily., Starting 07/15/2015, Until Discontinued, Normal    !! HYDROcodone-acetaminophen (NORCO) 5-325 MG per tablet Take 1 tablet by mouth every 6 (six) hours as needed for moderate pain., Starting 07/12/2015, Until Discontinued, Print    !! HYDROcodone-acetaminophen (NORCO/VICODIN) 5-325 MG per tablet Take 1 tablet by mouth every 6 (six) hours as needed for moderate pain., Starting 07/16/2015, Until Discontinued, Print    HYDROmorphone (DILAUDID) 2 MG tablet Take 1 tablet (2 mg total) by mouth every 4 (four) hours as needed for severe pain., Starting 07/09/2015, Until Discontinued, Print    metoCLOPramide (REGLAN) 10 MG tablet Take 1 tablet (10 mg total) by mouth 3 (three) times daily with meals., Starting 07/05/2015, Until Tue 07/04/16, Print    nicotine (NICODERM CQ - DOSED IN MG/24 HR) 7 mg/24hr patch Place 1 patch (7 mg total) onto the skin daily., Starting 07/16/2015, Until Discontinued, Normal    !! pantoprazole (PROTONIX) 40 MG tablet Take  1 tablet (40 mg total) by mouth daily., Starting 07/09/2015, Until Discontinued, Normal    !! pantoprazole (PROTONIX) 40 MG tablet Take 1 tablet (40 mg total) by mouth 2 (two) times daily before a meal., Starting 07/15/2015, Until Discontinued, Normal    promethazine (PHENERGAN) 25 MG suppository Place 1 suppository (25 mg total)  rectally every 6 (six) hours as needed for nausea or vomiting., Starting 07/12/2015, Until Discontinued, Print    QUEtiapine (SEROQUEL) 50 MG tablet Take 1 tablet (50 mg total) by mouth at bedtime., Starting 07/09/2015, Until Discontinued, Normal    sucralfate (CARAFATE) 1 G tablet Take 1 tablet (1 g total) by mouth 4 (four) times daily -  with meals and at bedtime. Dissolve in water and take it., Starting 07/09/2015, Until Discontinued, Normal    traMADol (ULTRAM) 50 MG tablet Take 1 tablet (50 mg total) by mouth every 12 (twelve) hours as needed for moderate pain., Starting 07/09/2015, Until Discontinued, Print    zolpidem (AMBIEN) 10 MG tablet Take 10 mg by mouth at bedtime., Until Discontinued, Historical Med     !! - Potential duplicate medications found. Please discuss with provider.       DISCHARGE INSTRUCTIONS:    If you experience worsening of your admission symptoms, develop shortness of breath, life threatening emergency, suicidal or homicidal thoughts you must seek medical attention immediately by calling 911 or calling your MD immediately  if symptoms less severe.  You Must read complete instructions/literature along with all the possible adverse reactions/side effects for all the Medicines you take and that have been prescribed to you. Take any new Medicines after you have completely understood and accept all the possible adverse reactions/side effects.   Please note  You were cared for by a hospitalist during your hospital stay. If you have any questions about your discharge medications or the care you received while you were in the hospital after you are discharged, you can call the unit and asked to speak with the hospitalist on call if the hospitalist that took care of you is not available. Once you are discharged, your primary care physician will handle any further medical issues. Please note that NO REFILLS for any discharge medications will be authorized once you are discharged,  as it is imperative that you return to your primary care physician (or establish a relationship with a primary care physician if you do not have one) for your aftercare needs so that they can reassess your need for medications and monitor your lab values.    Today   SUBJECTIVE   Mild abdominal pain. No nausea, vomiting or diarrhea.   VITAL SIGNS:  Blood pressure 125/65, pulse 85, temperature 97.6 F (36.4 C), temperature source Oral, resp. rate 20, height  (1.6 m), weight 79.652 kg (175 lb 9.6 oz), last menstrual period 07/06/2015, SpO2 99 %.  I/O:   Intake/Output Summary (Last 24 hours) at 07/26/15 1546 Last data filed at 07/26/15 0800  Gross per 24 hour  Intake    360 ml  Output      0 ml  Net    360 ml    PHYSICAL EXAMINATION:  GENERAL:  39 y.o.-year-old patient lying in the bed with no acute distress.  EYES: Pupils equal, round, reactive to light and accommodation. No scleral icterus. Extraocular muscles intact.  HEENT: Head atraumatic, normocephalic. Oropharynx and nasopharynx clear. Moist oral mucosa NECK:  Supple, no jugular venous distention. No thyroid enlargement, no tenderness.  LUNGS: Normal breath sounds bilaterally, no  wheezing, rales,rhonchi or crepitation. No use of accessory muscles of respiration.  CARDIOVASCULAR: S1, S2 normal. No murmurs, rubs, or gallops.  ABDOMEN: Soft, non-tender, non-distended. Bowel sounds present. No organomegaly or mass.  EXTREMITIES: No pedal edema, cyanosis, or clubbing.  NEUROLOGIC: Cranial nerves II through XII are intact. Muscle strength 5/5 in all extremities. Sensation intact. Gait not checked.  PSYCHIATRIC: The patient is alert and oriented x 3.  SKIN: No obvious rash, lesion, or ulcer.   DATA REVIEW:   CBC  Recent Labs Lab 07/26/15 0836  WBC 4.1  HGB 11.3*  HCT 33.9*  PLT 211    Chemistries   Recent Labs Lab 07/21/15 2241  07/24/15 0506  NA 142  < > 139  K 3.7  < > 3.5  CL 109  < > 106  CO2 24  < >  28  GLUCOSE 107*  < > 90  BUN <5*  < > <5*  CREATININE 0.72  < > 0.70  CALCIUM 9.4  < > 8.8*  AST 25  --   --   ALT 19  --   --   ALKPHOS 67  --   --   BILITOT 0.6  --   --   < > = values in this interval not displayed.  Cardiac Enzymes No results for input(s): TROPONINI in the last 168 hours.  Microbiology Results  Results for orders placed or performed during the hospital encounter of 07/12/15  Urine culture     Status: None   Collection Time: 07/12/15  7:30 AM  Result Value Ref Range Status   Specimen Description URINE, CLEAN CATCH  Final   Special Requests NONE  Final   Culture MULTIPLE SPECIES PRESENT, SUGGEST RECOLLECTION  Final   Report Status 07/14/2015 FINAL  Final    RADIOLOGY:  No results found.      Management plans discussed with the patient, family and they are in agreement.  CODE STATUS:   TOTAL TIME TAKING CARE OF THIS PATIENT: 38 minutes.    Shaune Pollack M.D on 07/26/2015 at 3:46 PM  Between 7am to 6pm - Pager - [phone]  After 6pm go to [email or web address] - password [email or web address] [facility]  [place]  [facility]  Office  [phone]  CC: Primary care physician; Baruch Gouty, MD

## 2015-07-27 ENCOUNTER — Inpatient Hospital Stay: Payer: Self-pay | Admitting: Family Medicine

## 2015-07-31 ENCOUNTER — Emergency Department: Payer: Medicaid Other

## 2015-07-31 ENCOUNTER — Emergency Department
Admission: EM | Admit: 2015-07-31 | Discharge: 2015-07-31 | Disposition: A | Discharge status: Home / Self Care | Payer: Medicaid Other | Attending: Emergency Medicine | Admitting: Emergency Medicine

## 2015-07-31 DIAGNOSIS — Z720 Tobacco use: Secondary | ICD-10-CM | POA: Insufficient documentation

## 2015-07-31 DIAGNOSIS — R42 Dizziness and giddiness: Secondary | ICD-10-CM | POA: Diagnosis not present

## 2015-07-31 DIAGNOSIS — R079 Chest pain, unspecified: Secondary | ICD-10-CM | POA: Diagnosis present

## 2015-07-31 DIAGNOSIS — F329 Major depressive disorder, single episode, unspecified: Secondary | ICD-10-CM | POA: Diagnosis not present

## 2015-07-31 DIAGNOSIS — F13239 Sedative, hypnotic or anxiolytic dependence with withdrawal, unspecified: Secondary | ICD-10-CM | POA: Insufficient documentation

## 2015-07-31 DIAGNOSIS — F419 Anxiety disorder, unspecified: Secondary | ICD-10-CM | POA: Insufficient documentation

## 2015-07-31 DIAGNOSIS — F13939 Sedative, hypnotic or anxiolytic use, unspecified with withdrawal, unspecified: Secondary | ICD-10-CM

## 2015-07-31 LAB — BASIC METABOLIC PANEL
Anion gap: 4 — ABNORMAL LOW (ref 5–15)
BUN: 5 mg/dL — AB (ref 6–20)
CALCIUM: 9.1 mg/dL (ref 8.9–10.3)
CO2: 27 mmol/L (ref 22–32)
CREATININE: 0.61 mg/dL (ref 0.44–1.00)
Chloride: 107 mmol/L (ref 101–111)
GFR calc non Af Amer: >60 mL/min (ref >60)
Glucose, Bld: 98 mg/dL (ref 65–99)
Potassium: 3.5 mmol/L (ref 3.5–5.1)
SODIUM: 138 mmol/L (ref 135–145)

## 2015-07-31 LAB — CBC
HCT: 35.4 % (ref 35.0–47.0)
Hemoglobin: 11.8 g/dL — ABNORMAL LOW (ref 12.0–16.0)
MCH: 29.6 pg (ref 26.0–34.0)
MCHC: 33.3 g/dL (ref 32.0–36.0)
MCV: 89 fL (ref 80.0–100.0)
PLATELETS: 223 10*3/uL (ref 150–440)
RBC: 3.97 MIL/uL (ref 3.80–5.20)
RDW: 14.9 % — AB (ref 11.5–14.5)
WBC: 7.3 10*3/uL (ref 3.6–11.0)

## 2015-07-31 LAB — TROPONIN I

## 2015-07-31 MED ORDER — SODIUM CHLORIDE 0.9 % IV SOLN
Freq: Once | INTRAVENOUS | Status: AC
  Administered 2015-07-31: 08:00:00 via INTRAVENOUS

## 2015-07-31 MED ORDER — ONDANSETRON HCL 4 MG/2ML IJ SOLN
4.0000 mg | Freq: Once | INTRAMUSCULAR | Status: AC
  Administered 2015-07-31: 4 mg via INTRAVENOUS
  Filled 2015-07-31: qty 2

## 2015-07-31 MED ORDER — ALPRAZOLAM 1 MG PO TABS: 2.0000 mg | ORAL_TABLET | Freq: Two times a day (BID) | ORAL | Status: DC

## 2015-07-31 MED ORDER — QUETIAPINE FUMARATE 50 MG PO TABS: 50.0000 mg | ORAL_TABLET | Freq: Every day | ORAL | Status: DC

## 2015-07-31 MED ORDER — PROMETHAZINE HCL 25 MG PO TABS: 25.0000 mg | ORAL_TABLET | ORAL | Status: DC | PRN

## 2015-07-31 MED ORDER — KETOROLAC TROMETHAMINE 30 MG/ML IJ SOLN
30.0000 mg | Freq: Once | INTRAMUSCULAR | Status: AC
  Administered 2015-07-31: 30 mg via INTRAVENOUS
  Filled 2015-07-31: qty 1

## 2015-07-31 MED ORDER — QUETIAPINE FUMARATE 25 MG PO TABS: 50.0000 mg | ORAL_TABLET | Freq: Every day | ORAL | Status: DC

## 2015-07-31 MED ORDER — SERTRALINE HCL 50 MG PO TABS
ORAL_TABLET | ORAL | Status: AC
  Administered 2015-07-31: 50 mg
  Filled 2015-07-31: qty 1

## 2015-07-31 MED ORDER — DIAZEPAM 5 MG PO TABS
10.0000 mg | ORAL_TABLET | Freq: Once | ORAL | Status: AC
  Administered 2015-07-31: 10 mg via ORAL
  Filled 2015-07-31: qty 2

## 2015-07-31 NOTE — Discharge Instructions (Signed)
Benzodiazepine Withdrawal  °Benzodiazepines are a group of drugs that are prescribed for both short-term and long-term treatment of a variety of medical conditions. For some of these conditions, such as seizures and sudden and severe muscle spasms, they are used only for a few hours or a few days. For other conditions, such as anxiety, sleep problems, or frequent muscle spasms or to help prevent seizures, they are used for an extended period, usually weeks or months. °Benzodiazepines work by changing the way your brain functions. Normally, chemicals in your brain called neurotransmitters send messages between your brain cells. The neurotransmitter that benzodiazepines affect is called gamma-aminobutyric acid (GABA). GABA sends out messages that have a calming effect on many of the functions of your brain. Benzodiazepines make these messages stronger and increase this calming effect. °Short-term use of benzodiazepines usually does not cause problems when you stop taking the drugs. However, if you take benzodiazepines for a long time, your body can adjust to the drug and require more of it to produce the same effect (drug tolerance). Eventually, you can develop physical dependence on benzodiazepines, which is when you experience negative effects if your dosage of benzodiazepines is reduced or stopped too quickly. These negative effects are called symptoms of withdrawal. °SYMPTOMS °Symptoms of withdrawal may begin anytime within the first 10 days after you stop taking the benzodiazepine. They can last from several weeks up to a few months but usually are the worst between the first 10 to 14 days.  °The actual symptoms also vary, depending on the type of benzodiazepine you take. Possible symptoms include: °· Anxiety. °· Excitability. °· Irritability. °· Depression. °· Mood swings. °· Trouble sleeping. °· Confusion. °· Uncontrollable shaking (tremors). °· Muscle weakness. °· Seizures. °DIAGNOSIS °To diagnose  benzodiazepine withdrawal, your caregiver will examine you for certain signs, such as: °· Rapid heartbeat. °· Rapid breathing. °· Tremors. °· High blood pressure. °· Fever. °· Mood changes. °Your caregiver also may ask the following questions about your use of benzodiazepines: °· What type of benzodiazepine did you take? °· How much did you take each day? °· How long did you take the drug? °· When was the last time you took the drug? °· Do you take any other drugs? °· Have you had alcohol recently? °· Have you had a seizure recently? °· Have you lost consciousness recently? °· Have you had trouble remembering recent events? °· Have you had a recent increase in anxiety, irritability, or trouble sleeping? °A drug test also may be administered. °TREATMENT °The treatment for benzodiazepine withdrawal can vary, depending on the type and severity of your symptoms, what type of benzodiazepine you have been taking, and how long you have been taking the benzodiazepine. Sometimes it is necessary for you to be treated in a hospital, especially if you are at risk of seizures.  °Often, treatment includes a prescription for a long-acting benzodiazepine, the dosage of which is reduced slowly over a long period. This period could be several weeks or months. Eventually, your dosage will be reduced to a point that you can stop taking the drug, without experiencing withdrawal symptoms. This is called tapered withdrawal. Occasionally, minor symptoms of withdrawal continue for a few days or weeks after you have completed a tapered withdrawal. °SEEK IMMEDIATE MEDICAL CARE IF: °· You have a seizure. °· You develop a craving for drugs or alcohol. °· You begin to experience symptoms of withdrawal during your tapered withdrawal. °· You become very confused. °· You lose consciousness. °· You   have trouble breathing.  You think about hurting yourself or someone else. Document Released: 10/05/2011 Document Revised: 01/08/2012 Document  Reviewed: 10/05/2011 [facility] Patient Information 2015 [place], [place]. This information is not intended to replace advice given to you by your health care provider. Make sure you discuss any questions you have with your health care provider.  Benzodiazepine Withdrawal  Benzodiazepines are a group of drugs that are prescribed for both short-term and long-term treatment of a variety of medical conditions. For some of these conditions, such as seizures and sudden and severe muscle spasms, they are used only for a few hours or a few days. For other conditions, such as anxiety, sleep problems, or frequent muscle spasms or to help prevent seizures, they are used for an extended period, usually weeks or months. Benzodiazepines work by changing the way your brain functions. Normally, chemicals in your brain called neurotransmitters send messages between your brain cells. The neurotransmitter that benzodiazepines affect is called gamma-aminobutyric acid (GABA). GABA sends out messages that have a calming effect on many of the functions of your brain. Benzodiazepines make these messages stronger and increase this calming effect. Short-term use of benzodiazepines usually does not cause problems when you stop taking the drugs. However, if you take benzodiazepines for a long time, your body can adjust to the drug and require more of it to produce the same effect (drug tolerance). Eventually, you can develop physical dependence on benzodiazepines, which is when you experience negative effects if your dosage of benzodiazepines is reduced or stopped too quickly. These negative effects are called symptoms of withdrawal. SYMPTOMS Symptoms of withdrawal may begin anytime within the first 10 days after you stop taking the benzodiazepine. They can last from several weeks up to a few months but usually are the worst between the first 10 to 14 days.  The actual symptoms also vary, depending on the type of benzodiazepine you  take. Possible symptoms include:  Anxiety.  Excitability.  Irritability.  Depression.  Mood swings.  Trouble sleeping.  Confusion.  Uncontrollable shaking (tremors).  Muscle weakness.  Seizures. DIAGNOSIS To diagnose benzodiazepine withdrawal, your caregiver will examine you for certain signs, such as:  Rapid heartbeat.  Rapid breathing.  Tremors.  High blood pressure.  Fever.  Mood changes. Your caregiver also may ask the following questions about your use of benzodiazepines:  What type of benzodiazepine did you take?  How much did you take each day?  How long did you take the drug?  When was the last time you took the drug?  Do you take any other drugs?  Have you had alcohol recently?  Have you had a seizure recently?  Have you lost consciousness recently?  Have you had trouble remembering recent events?  Have you had a recent increase in anxiety, irritability, or trouble sleeping? A drug test also may be administered. TREATMENT The treatment for benzodiazepine withdrawal can vary, depending on the type and severity of your symptoms, what type of benzodiazepine you have been taking, and how long you have been taking the benzodiazepine. Sometimes it is necessary for you to be treated in a hospital, especially if you are at risk of seizures.  Often, treatment includes a prescription for a long-acting benzodiazepine, the dosage of which is reduced slowly over a long period. This period could be several weeks or months. Eventually, your dosage will be reduced to a point that you can stop taking the drug, without experiencing withdrawal symptoms. This is called tapered withdrawal. Occasionally, minor symptoms  of withdrawal continue for a few days or weeks after you have completed a tapered withdrawal. SEEK IMMEDIATE MEDICAL CARE IF:  You have a seizure.  You develop a craving for drugs or alcohol.  You begin to experience symptoms of withdrawal during  your tapered withdrawal.  You become very confused.  You lose consciousness.  You have trouble breathing.  You think about hurting yourself or someone else. Document Released: 10/05/2011 Document Revised: 01/08/2012 Document Reviewed: 10/05/2011 [facility] Patient Information 2015 [place], [place]. This information is not intended to replace advice given to you by your health care provider. Make sure you discuss any questions you have with your health care provider.

## 2015-07-31 NOTE — ED Notes (Signed)
Pt states nauseated at time of d/c. MD notified and phenergan rx written. Administered  iv zofran per MD order at d/c.

## 2015-07-31 NOTE — ED Notes (Signed)
Patient reports 2 recent admissions in September.  Reports since being discharged has had chest pain and dizziness.  Patient states "I just don't feel right."

## 2015-07-31 NOTE — ED Provider Notes (Signed)
[facility] Emergency Department Provider Note     Time seen: ----------------------------------------- 7:48 AM on 07/31/2015 -----------------------------------------    I have reviewed the triage vital signs and the nursing notes.   HISTORY  Chief Complaint Chest Pain and Dizziness    HPI Rhegan Trunnell is a 39 y.o. female who presents ER with chest pain and dizziness. Patient states she just doesn't feel right.Patient reports she has recently run out of her Seroquel and Xanax, her Shirlee Latch is dizziness and feeling like she is off balance. She is also tearful, feels like her anxiety is out of control. She was recently admitted the hospital for gastritis and H. pylori infection. Patient states she still has epigastric pain but is not vomiting.   Past Medical History  Diagnosis Date  . Sleep apnea   . GERD (gastroesophageal reflux disease)   . Hyperlipidemia   . Depression   . Anxiety   . PTSD (post-traumatic stress disorder)   . Obesity   . Anemia   . Vitamin D deficiency   . Nontoxic uninodular goiter     Patient Active Problem List   Diagnosis Date Noted  . Intractable nausea and vomiting 07/22/2015  . Epigastric pain   . Helicobacter positive gastritis 07/16/2015  . Gastritis and gastroduodenitis 07/16/2015  . Anemia 07/13/2015  . Hyperlipidemia 07/13/2015  . Vitamin D deficiency 07/13/2015  . Thyroid nodule 07/13/2015  . Polyp of gallbladder 07/13/2015  . Liver nodule 07/13/2015  . Nausea & vomiting 07/13/2015  . Gastroenteritis 07/06/2015  . Viral gastroenteritis 07/05/2015  . 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 Alcohol abuse 04/01/2015    Past Surgical History  Procedure Laterality Date  . Cesarean section    . Esophagogastroduodenoscopy (egd) with propofol Left 07/07/2015    Procedure: ESOPHAGOGASTRODUODENOSCOPY (EGD) WITH PROPOFOL;  Surgeon: Scot Jun, MD;  Location: [facility] ENDOSCOPY;  Service: Endoscopy;   Laterality: Left;  . Cesarean section      Allergies Review of patient's allergies indicates no known allergies.  Social History Social History  Substance Use Topics  . Smoking status: Current Some Day Smoker -- 0.50 packs/day    Types: Cigarettes  . Smokeless tobacco: Not on file  . Alcohol Use: Yes     Comment: occasional    Review of Systems Constitutional: Negative for fever. Eyes: Negative for visual changes. ENT: Negative for sore throat. Cardiovascular: Negative for chest pain. Respiratory: Negative for shortness of breath. Gastrointestinal: Negative for abdominal pain, vomiting and diarrhea. Genitourinary: Negative for dysuria. Musculoskeletal: Negative for back pain. Skin: Negative for rash. Neurological: Negative for headaches, focal weakness or numbness. Positive for dizziness Psychiatric: Positive for anxiety  10-point ROS otherwise negative.  ____________________________________________   PHYSICAL EXAM:  VITAL SIGNS: ED Triage Vitals  Enc Vitals Group     BP 07/31/15 0456 147/97 mmHg     Pulse Rate 07/31/15 0456 83     Resp --      Temp 07/31/15 0456 98.7 F (37.1 C)     Temp Source 07/31/15 0456 Oral     SpO2 07/31/15 0456 100 %     Weight 07/31/15 0456 181 lb (82.101 kg)     Height 07/31/15 0456  (1.6 m)     Head Cir --      Peak Flow --      Pain Score 07/31/15 0500 6     Pain Loc --      Pain Edu? --      Excl. in  GC? --     Constitutional: Alert and oriented. Well appearing and in no distress. Eyes: Conjunctivae are normal. PERRL. Normal extraocular movements. ENT   Head: Normocephalic and atraumatic.   Nose: No congestion/rhinnorhea.   Mouth/Throat: Mucous membranes are moist.   Neck: No stridor. Cardiovascular: Normal rate, regular rhythm. Normal and symmetric distal pulses are present in all extremities. No murmurs, rubs, or gallops. Respiratory: Normal respiratory effort without tachypnea nor retractions. Breath  sounds are clear and equal bilaterally. No wheezes/rales/rhonchi. Gastrointestinal: Soft and nontender. No distention. No abdominal bruits.  Musculoskeletal: Nontender with normal range of motion in all extremities. No joint effusions.  No lower extremity tenderness nor edema. Neurologic:  Normal speech and language. No gross focal neurologic deficits are appreciated. Speech is normal. No gait instability. Skin:  Skin is warm, dry and intact. No rash noted. Psychiatric: Depressed mood and affect, patient tearful and also anxious. ____________________________________________  EKG: Interpreted by me. Sinus rhythm with a rate of 77 bpm, possible voltage criteria for LVH, normal axis. No evidence of acute infarction.  ____________________________________________  ED COURSE:  Pertinent labs & imaging results that were available during my care of the patient were reviewed by me and considered in my medical decision making (see chart for details). We'll check basic labs, patient will be restarted on Seroquel and benzodiazepines. She is likely having some withdrawal symptoms. ____________________________________________    LABS (pertinent positives/negatives)  Labs Reviewed  BASIC METABOLIC PANEL - Abnormal; Notable for the following:    BUN 5 (*)    Anion gap 4 (*)    All other components within normal limits  CBC - Abnormal; Notable for the following:    Hemoglobin 11.8 (*)    RDW 14.9 (*)    All other components within normal limits  TROPONIN I   ____________________________________________  FINAL ASSESSMENT AND PLAN  Dizziness, medication withdrawal  Plan: Patient with labs and imaging as dictated above. Patient is in no acute distress, labs at this point are unremarkable. She was started back on Valium and Seroquel. She is given enough to get her to Sunday. Otherwise she will likely be back in the ER with similar symptoms. She is stable for outpatient follow-up on  Monday.   Emily Filbert, MD   Emily Filbert, MD 07/31/15 [phone]

## 2015-08-02 ENCOUNTER — Emergency Department
Admission: EM | Admit: 2015-08-02 | Discharge: 2015-08-03 | Disposition: A | Discharge status: Home / Self Care | Payer: Medicaid Other | Attending: Emergency Medicine | Admitting: Emergency Medicine

## 2015-08-02 ENCOUNTER — Encounter: Payer: Self-pay | Admitting: Emergency Medicine

## 2015-08-02 DIAGNOSIS — K273 Acute peptic ulcer, site unspecified, without hemorrhage or perforation: Secondary | ICD-10-CM | POA: Diagnosis not present

## 2015-08-02 DIAGNOSIS — R109 Unspecified abdominal pain: Secondary | ICD-10-CM | POA: Diagnosis present

## 2015-08-02 DIAGNOSIS — K2970 Gastritis, unspecified, without bleeding: Secondary | ICD-10-CM | POA: Diagnosis not present

## 2015-08-02 DIAGNOSIS — Z720 Tobacco use: Secondary | ICD-10-CM | POA: Diagnosis not present

## 2015-08-02 DIAGNOSIS — Z792 Long term (current) use of antibiotics: Secondary | ICD-10-CM | POA: Diagnosis not present

## 2015-08-02 DIAGNOSIS — Z3202 Encounter for pregnancy test, result negative: Secondary | ICD-10-CM | POA: Insufficient documentation

## 2015-08-02 DIAGNOSIS — Z79899 Other long term (current) drug therapy: Secondary | ICD-10-CM | POA: Insufficient documentation

## 2015-08-02 DIAGNOSIS — E876 Hypokalemia: Secondary | ICD-10-CM | POA: Insufficient documentation

## 2015-08-02 DIAGNOSIS — R531 Weakness: Secondary | ICD-10-CM | POA: Diagnosis not present

## 2015-08-02 LAB — URINALYSIS COMPLETE WITH MICROSCOPIC (ARMC ONLY)
BILIRUBIN URINE: NEGATIVE
Glucose, UA: NEGATIVE mg/dL
LEUKOCYTES UA: NEGATIVE
Nitrite: NEGATIVE
PH: 7 (ref 5.0–8.0)
PROTEIN: 30 mg/dL — AB
Specific Gravity, Urine: 1.017 (ref 1.005–1.030)

## 2015-08-02 LAB — CBC
HEMATOCRIT: 38.7 % (ref 35.0–47.0)
HEMOGLOBIN: 12.9 g/dL (ref 12.0–16.0)
MCH: 29.6 pg (ref 26.0–34.0)
MCHC: 33.2 g/dL (ref 32.0–36.0)
MCV: 89.1 fL (ref 80.0–100.0)
Platelets: 259 10*3/uL (ref 150–440)
RBC: 4.34 MIL/uL (ref 3.80–5.20)
RDW: 14.7 % — AB (ref 11.5–14.5)
WBC: 9.1 10*3/uL (ref 3.6–11.0)

## 2015-08-02 LAB — COMPREHENSIVE METABOLIC PANEL
ALBUMIN: 4.4 g/dL (ref 3.5–5.0)
ALK PHOS: 59 U/L (ref 38–126)
ALT: 16 U/L (ref 14–54)
ANION GAP: 9 (ref 5–15)
AST: 16 U/L (ref 15–41)
CALCIUM: 9.7 mg/dL (ref 8.9–10.3)
CO2: 30 mmol/L (ref 22–32)
Chloride: 101 mmol/L (ref 101–111)
Creatinine, Ser: 0.68 mg/dL (ref 0.44–1.00)
GFR calc Af Amer: >60 mL/min (ref >60)
GFR calc non Af Amer: >60 mL/min (ref >60)
GLUCOSE: 99 mg/dL (ref 65–99)
POTASSIUM: 3.4 mmol/L — AB (ref 3.5–5.1)
SODIUM: 140 mmol/L (ref 135–145)
Total Bilirubin: 0.8 mg/dL (ref 0.3–1.2)
Total Protein: 8 g/dL (ref 6.5–8.1)

## 2015-08-02 LAB — TROPONIN I: Troponin I: <0.03 ng/mL (ref <0.031)

## 2015-08-02 LAB — LIPASE, BLOOD: Lipase: 25 U/L (ref 22–51)

## 2015-08-02 LAB — POCT PREGNANCY, URINE: PREG TEST UR: NEGATIVE

## 2015-08-02 NOTE — ED Notes (Signed)
Patient ambulatory to triage with steady gait, without difficulty or distress noted; pt reports N/V and mid upper abd pain; pt has been seen here multiple times for same

## 2015-08-03 ENCOUNTER — Emergency Department: Payer: Medicaid Other

## 2015-08-03 ENCOUNTER — Encounter: Payer: Self-pay | Admitting: Emergency Medicine

## 2015-08-03 ENCOUNTER — Emergency Department
Admission: EM | Admit: 2015-08-03 | Discharge: 2015-08-03 | Disposition: A | Discharge status: Home / Self Care | Payer: Medicaid Other | Source: Home / Self Care | Attending: Emergency Medicine | Admitting: Emergency Medicine

## 2015-08-03 DIAGNOSIS — K2970 Gastritis, unspecified, without bleeding: Secondary | ICD-10-CM

## 2015-08-03 DIAGNOSIS — K279 Peptic ulcer, site unspecified, unspecified as acute or chronic, without hemorrhage or perforation: Secondary | ICD-10-CM

## 2015-08-03 DIAGNOSIS — R1110 Vomiting, unspecified: Secondary | ICD-10-CM

## 2015-08-03 LAB — COMPREHENSIVE METABOLIC PANEL
ALT: 16 U/L (ref 14–54)
ANION GAP: 9 (ref 5–15)
AST: 17 U/L (ref 15–41)
Albumin: 4.5 g/dL (ref 3.5–5.0)
Alkaline Phosphatase: 57 U/L (ref 38–126)
BUN: 7 mg/dL (ref 6–20)
CHLORIDE: 100 mmol/L — AB (ref 101–111)
CO2: 30 mmol/L (ref 22–32)
CREATININE: 0.68 mg/dL (ref 0.44–1.00)
Calcium: 9.5 mg/dL (ref 8.9–10.3)
GFR calc non Af Amer: >60 mL/min (ref >60)
Glucose, Bld: 93 mg/dL (ref 65–99)
POTASSIUM: 3.2 mmol/L — AB (ref 3.5–5.1)
SODIUM: 139 mmol/L (ref 135–145)
Total Bilirubin: 1.2 mg/dL (ref 0.3–1.2)
Total Protein: 8 g/dL (ref 6.5–8.1)

## 2015-08-03 LAB — CBC
HCT: 36.7 % (ref 35.0–47.0)
Hemoglobin: 12.5 g/dL (ref 12.0–16.0)
MCH: 30 pg (ref 26.0–34.0)
MCHC: 33.9 g/dL (ref 32.0–36.0)
MCV: 88.5 fL (ref 80.0–100.0)
Platelets: 289 K/uL (ref 150–440)
RBC: 4.15 MIL/uL (ref 3.80–5.20)
RDW: 14.8 % — ABNORMAL HIGH (ref 11.5–14.5)
WBC: 9.6 K/uL (ref 3.6–11.0)

## 2015-08-03 LAB — LIPASE, BLOOD: LIPASE: 30 U/L (ref 22–51)

## 2015-08-03 MED ORDER — SUCRALFATE 1 G PO TABS
1.0000 g | ORAL_TABLET | Freq: Once | ORAL | Status: AC
  Administered 2015-08-03: 1 g via ORAL
  Filled 2015-08-03: qty 1

## 2015-08-03 MED ORDER — METOCLOPRAMIDE HCL 10 MG PO TABS: 10.0000 mg | ORAL_TABLET | Freq: Three times a day (TID) | ORAL | Status: DC

## 2015-08-03 MED ORDER — SUCRALFATE 1 G PO TABS: 1.0000 g | ORAL_TABLET | Freq: Two times a day (BID) | ORAL | Status: DC

## 2015-08-03 MED ORDER — ONDANSETRON HCL 4 MG/2ML IJ SOLN
4.0000 mg | Freq: Once | INTRAMUSCULAR | Status: AC
  Administered 2015-08-03: 4 mg via INTRAVENOUS
  Filled 2015-08-03: qty 2

## 2015-08-03 MED ORDER — QUETIAPINE FUMARATE 25 MG PO TABS
50.0000 mg | ORAL_TABLET | Freq: Once | ORAL | Status: AC
  Administered 2015-08-03: 50 mg via ORAL

## 2015-08-03 MED ORDER — SODIUM CHLORIDE 0.9 % IV BOLUS (SEPSIS)
1000.0000 mL | Freq: Once | INTRAVENOUS | Status: AC
  Administered 2015-08-03: 1000 mL via INTRAVENOUS

## 2015-08-03 MED ORDER — QUETIAPINE FUMARATE 25 MG PO TABS: 50.0000 mg | ORAL_TABLET | Freq: Every day | ORAL | Status: DC

## 2015-08-03 MED ORDER — METOCLOPRAMIDE HCL 10 MG PO TABS
10.0000 mg | ORAL_TABLET | Freq: Once | ORAL | Status: AC
  Administered 2015-08-03: 10 mg via ORAL
  Filled 2015-08-03: qty 1

## 2015-08-03 MED ORDER — FAMOTIDINE 20 MG PO TABS
40.0000 mg | ORAL_TABLET | Freq: Once | ORAL | Status: AC
  Administered 2015-08-03: 40 mg via ORAL
  Filled 2015-08-03: qty 2

## 2015-08-03 MED ORDER — QUETIAPINE FUMARATE 25 MG PO TABS
ORAL_TABLET | ORAL | Status: AC
  Administered 2015-08-03: 50 mg via ORAL
  Filled 2015-08-03: qty 2

## 2015-08-03 MED ORDER — DIAZEPAM 5 MG PO TABS
5.0000 mg | ORAL_TABLET | Freq: Once | ORAL | Status: AC
  Administered 2015-08-03: 5 mg via ORAL
  Filled 2015-08-03: qty 1

## 2015-08-03 MED ORDER — PANTOPRAZOLE SODIUM 40 MG PO TBEC
40.0000 mg | DELAYED_RELEASE_TABLET | Freq: Once | ORAL | Status: AC
  Administered 2015-08-03: 40 mg via ORAL
  Filled 2015-08-03: qty 1

## 2015-08-03 MED ORDER — GI COCKTAIL ~~LOC~~
30.0000 mL | Freq: Once | ORAL | Status: AC
  Administered 2015-08-03: 30 mL via ORAL
  Filled 2015-08-03: qty 30

## 2015-08-03 NOTE — ED Provider Notes (Signed)
[facility] Emergency Department Provider Note  ____________________________________________  Time seen: Approximately 410 AM  I have reviewed the triage vital signs and the nursing notes.   HISTORY  Chief Complaint Abdominal Pain    HPI Laurie Frederick is a 39 y.o. female who comes in today with epigastric pain. The patient reports that she has been told that she has H. pylori, gastritis and ulcers and has been dealing with these symptoms for the past 3 weeks. The patient reports that she has had constant pain and continued vomiting. She reports that her body feels weak. The patient was a follow-up with GI and was given amoxicillin and clarithromycin but she has not followed up. The patient reports that she finished her medications 2 days ago and she still having pain. The patient was admitted twice for this pain and reports that nothing has been improving. The patient has not been on any acid blocking medications. She was last discharged 2 weeks ago and reports she has not had time to follow-up. The patient reports that she can keep anything down the pain as a 9 out of 10 in intensity. The patient is very nauseous and very tearful.   Past Medical History  Diagnosis Date  . Sleep apnea   . GERD (gastroesophageal reflux disease)   . Hyperlipidemia   . Depression   . Anxiety   . PTSD (post-traumatic stress disorder)   . Obesity   . Anemia   . Vitamin D deficiency   . Nontoxic uninodular goiter     Patient Active Problem List   Diagnosis Date Noted  . Intractable nausea and vomiting 07/22/2015  . Epigastric pain   . Helicobacter positive gastritis 07/16/2015  . Gastritis and gastroduodenitis 07/16/2015  . Anemia 07/13/2015  . Hyperlipidemia 07/13/2015  . Vitamin D deficiency 07/13/2015  . Thyroid nodule 07/13/2015  . Polyp of gallbladder 07/13/2015  . Liver nodule 07/13/2015  . Nausea & vomiting 07/13/2015  . Gastroenteritis 07/06/2015  . Viral  gastroenteritis 07/05/2015  . 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 Alcohol abuse 04/01/2015    Past Surgical History  Procedure Laterality Date  . Cesarean section    . Esophagogastroduodenoscopy (egd) with propofol Left 07/07/2015    Procedure: ESOPHAGOGASTRODUODENOSCOPY (EGD) WITH PROPOFOL;  Surgeon: Scot Jun, MD;  Location: [facility] ENDOSCOPY;  Service: Endoscopy;  Laterality: Left;  . Cesarean section      Current Outpatient Rx  Name  Route  Sig  Dispense  Refill  . ALPRAZolam (XANAX) 1 MG tablet   Oral   Take 2 tablets (2 mg total) by mouth 2 (two) times daily.   6 tablet   0   . citalopram (CELEXA) 20 MG tablet   Oral   Take 1 tablet (20 mg total) by mouth at bedtime.   30 tablet   0   . HYDROcodone-acetaminophen (NORCO/VICODIN) 5-325 MG per tablet   Oral   Take 1 tablet by mouth every 6 (six) hours as needed for moderate pain.   20 tablet   0   . HYDROmorphone (DILAUDID) 2 MG tablet   Oral   Take 1 tablet (2 mg total) by mouth every 4 (four) hours as needed for severe pain.   5 tablet   0   . metoCLOPramide (REGLAN) 10 MG tablet   Oral   Take 1 tablet (10 mg total) by mouth 3 (three) times daily with meals.   90 tablet   1   . nicotine (NICODERM  CQ - DOSED IN MG/24 HR) 7 mg/24hr patch   Transdermal   Place 1 patch (7 mg total) onto the skin daily.   28 patch   0   . pantoprazole (PROTONIX) 40 MG tablet   Oral   Take 1 tablet (40 mg total) by mouth 2 (two) times daily before a meal.   60 tablet   0   . promethazine (PHENERGAN) 25 MG tablet   Oral   Take 1 tablet (25 mg total) by mouth every 4 (four) hours as needed for nausea or vomiting.   12 tablet   0   . QUEtiapine (SEROQUEL) 50 MG tablet   Oral   Take 1 tablet (50 mg total) by mouth at bedtime.   6 tablet   0   . sucralfate (CARAFATE) 1 G tablet   Oral   Take 1 tablet (1 g total) by mouth 4 (four) times daily -  with meals and at bedtime. Dissolve in water and take it.    120 tablet   0   . traMADol (ULTRAM) 50 MG tablet   Oral   Take 1 tablet (50 mg total) by mouth every 12 (twelve) hours as needed for moderate pain.   10 tablet   0   . alprazolam (XANAX) 2 MG tablet   Oral   Take 2 mg by mouth 2 (two) times daily.         Marland Kitchen amoxicillin (AMOXIL) 500 MG capsule   Oral   Take 2 capsules (1,000 mg total) by mouth 2 (two) times daily. Patient not taking: Reported on 08/03/2015   26 capsule   0   . clarithromycin (BIAXIN) 500 MG tablet   Oral   Take 1 tablet (500 mg total) by mouth 2 (two) times daily. Patient not taking: Reported on 08/03/2015   26 tablet   0   . HYDROcodone-acetaminophen (NORCO) 5-325 MG per tablet   Oral   Take 1 tablet by mouth every 6 (six) hours as needed for moderate pain.   20 tablet   0   . metoCLOPramide (REGLAN) 10 MG tablet   Oral   Take 1 tablet (10 mg total) by mouth 3 (three) times daily with meals.   30 tablet   0   . pantoprazole (PROTONIX) 40 MG tablet   Oral   Take 1 tablet (40 mg total) by mouth daily.   60 tablet   0   . promethazine (PHENERGAN) 25 MG suppository   Rectal   Place 1 suppository (25 mg total) rectally every 6 (six) hours as needed for nausea or vomiting. Patient not taking: Reported on 08/03/2015   12 each   0   . QUEtiapine (SEROQUEL) 50 MG tablet   Oral   Take 1 tablet (50 mg total) by mouth at bedtime.   30 tablet   0   . sucralfate (CARAFATE) 1 G tablet   Oral   Take 1 tablet (1 g total) by mouth 2 (two) times daily.   30 tablet   0   . zolpidem (AMBIEN) 10 MG tablet   Oral   Take 10 mg by mouth at bedtime.           Allergies Review of patient's allergies indicates no known allergies.  Family History  Problem Relation Age of Onset  . Lung cancer Father   . Lung cancer Paternal Aunt   . Bone cancer Paternal Uncle   . Depression Mother   . Anxiety  disorder Mother   . Arthritis Mother   . Hyperlipidemia Mother   . Cancer Father     lung cancer  .  Alcohol abuse Father   . Hypertension Maternal Grandmother   . Diabetes Paternal Grandmother   . Heart disease Paternal Grandmother   . Hypertension Paternal Grandmother     Social History Social History  Substance Use Topics  . Smoking status: Current Some Day Smoker -- 0.50 packs/day    Types: Cigarettes  . Smokeless tobacco: None  . Alcohol Use: Yes     Comment: occasional    Review of Systems Constitutional: No fever/chills Eyes: No visual changes. ENT: No sore throat. Cardiovascular: Denies chest pain. Respiratory: Denies shortness of breath. Gastrointestinal: Epigastric pain nausea and vomiting No diarrhea.  No constipation. Genitourinary: Negative for dysuria. Musculoskeletal: Negative for back pain. Skin: Negative for rash. Neurological: Generalized weakness  10-point ROS otherwise negative.  ____________________________________________   PHYSICAL EXAM:  VITAL SIGNS: ED Triage Vitals  Enc Vitals Group     BP 08/03/15 0030 151/100 mmHg     Pulse Rate 08/03/15 0030 62     Resp 08/03/15 0030 18     Temp 08/03/15 0030 98 F (36.7 C)     Temp Source 08/03/15 0030 Oral     SpO2 08/03/15 0030 99 %     Weight 08/03/15 0030 181 lb (82.101 kg)     Height 08/03/15 0030  (1.575 m)     Head Cir --      Peak Flow --      Pain Score 08/03/15 0029 10     Pain Loc --      Pain Edu? --      Excl. in GC? --     Constitutional: Alert and oriented. Well appearing and in moderate distress. Eyes: Conjunctivae are normal. PERRL. EOMI. Head: Atraumatic. Nose: No congestion/rhinnorhea. Mouth/Throat: Mucous membranes are moist.  Oropharynx non-erythematous. Cardiovascular: Normal rate, regular rhythm. Grossly normal heart sounds.  Good peripheral circulation. Respiratory: Normal respiratory effort.  No retractions. Lungs CTAB. Gastrointestinal: Soft with mild epigastric tenderness to palpation. No distention. Positive bowel sounds Musculoskeletal: No lower extremity  tenderness nor edema.   Neurologic:  Normal speech and language.  Skin:  Skin is warm, dry and intact.  Psychiatric: Mood and affect are normal.   ____________________________________________   LABS (all labs ordered are listed, but only abnormal results are displayed)  Labs Reviewed  COMPREHENSIVE METABOLIC PANEL - Abnormal; Notable for the following:    Potassium 3.2 (*)    Chloride 100 (*)    All other components within normal limits  CBC - Abnormal; Notable for the following:    RDW 14.8 (*)    All other components within normal limits  LIPASE, BLOOD  URINALYSIS COMPLETEWITH MICROSCOPIC ([facility] ONLY)   ____________________________________________  EKG  ED ECG REPORT I, Rebecka Apley, the attending physician, personally viewed and interpreted this ECG.   Date: 10/0/2016  EKG Time: 2043  Rate: 57  Rhythm: sinus bradycardia  Axis: Normal  Intervals:none  ST&T Change: None  ____________________________________________  RADIOLOGY  Chest x-ray: No radiographic evidence for free intraperitoneal air on this single upright view of the chest, no active cardiopulmonary disease ____________________________________________   PROCEDURES  Procedure(s) performed: None  Critical Care performed: No  ____________________________________________   INITIAL IMPRESSION / ASSESSMENT AND PLAN / ED COURSE  Pertinent labs & imaging results that were available during my care of the patient were reviewed by me and  considered in my medical decision making (see chart for details).  This is a 39 year old female who is a history of gastritis and ulcers who comes in with epigastric pain. The patient has been having this pain for the last 3 weeks but has not followed up as she has been advised. I did give the patient a dose of Carafate, GI cocktail and Pepcid for her symptoms. I will give the patient a dose of Protonix and Reglan as well. I will encourage the patient to follow-up with  the GI physician. The patient also received a liter of normal saline as well as some Zofran.  The patient is continuing to complain that she is nauseous and concerned about vomiting. I informed her that these medications she needs to help her ulcers to heal. I encouraged her to follow-up with GI and to try a clear liquid diet for a few days to see if that would help her stomach. Otherwise the patient also asked for some Seroquel which she missed last night and some Valium. I will give the patient is medications and discharge her to home to follow-up with GI as previously discussed. ____________________________________________   FINAL CLINICAL IMPRESSION(S) / ED DIAGNOSES  Final diagnoses:  Gastritis  Intractable vomiting with nausea, vomiting of unspecified type  Peptic ulcer disease      Rebecka Apley, MD 08/03/15 [phone]

## 2015-08-03 NOTE — ED Notes (Signed)
Pt to triage via w/c with no distress noted, brought in by [facility]; pt c/o N/V/ and mid abd pain; pt has been seen here multiple times for same; was here earlier tonight and left while in lobby to call EMS to bring her back

## 2015-08-03 NOTE — ED Notes (Signed)
Pt apparently left while waiting in lobby for available exam room and called EMS to bring her back; pt discharged off board and re-registered per charge nurse

## 2015-08-03 NOTE — Discharge Instructions (Signed)
Gastritis, Adult Gastritis is soreness and swelling (inflammation) of the lining of the stomach. Gastritis can develop as a sudden onset (acute) or long-term (chronic) condition. If gastritis is not treated, it can lead to stomach bleeding and ulcers. CAUSES  Gastritis occurs when the stomach lining is weak or damaged. Digestive juices from the stomach then inflame the weakened stomach lining. The stomach lining may be weak or damaged due to viral or bacterial infections. One common bacterial infection is the Helicobacter pylori infection. Gastritis can also result from excessive alcohol consumption, taking certain medicines, or having too much acid in the stomach.  SYMPTOMS  In some cases, there are no symptoms. When symptoms are present, they may include:  Pain or a burning sensation in the upper abdomen.  Nausea.  Vomiting.  An uncomfortable feeling of fullness after eating. DIAGNOSIS  Your caregiver may suspect you have gastritis based on your symptoms and a physical exam. To determine the cause of your gastritis, your caregiver may perform the following:  Blood or stool tests to check for the H pylori bacterium.  Gastroscopy. A thin, flexible tube (endoscope) is passed down the esophagus and into the stomach. The endoscope has a light and camera on the end. Your caregiver uses the endoscope to view the inside of the stomach.  Taking a tissue sample (biopsy) from the stomach to examine under a microscope. TREATMENT  Depending on the cause of your gastritis, medicines may be prescribed. If you have a bacterial infection, such as an H pylori infection, antibiotics may be given. If your gastritis is caused by too much acid in the stomach, H2 blockers or antacids may be given. Your caregiver may recommend that you stop taking aspirin, ibuprofen, or other nonsteroidal anti-inflammatory drugs (NSAIDs). HOME CARE INSTRUCTIONS  Only take over-the-counter or prescription medicines as directed by  your caregiver.  If you were given antibiotic medicines, take them as directed. Finish them even if you start to feel better.  Drink enough fluids to keep your urine clear or pale yellow.  Avoid foods and drinks that make your symptoms worse, such as:  Caffeine or alcoholic drinks.  Chocolate.  Peppermint or mint flavorings.  Garlic and onions.  Spicy foods.  Citrus fruits, such as oranges, lemons, or limes.  Tomato-based foods such as sauce, chili, salsa, and pizza.  Fried and fatty foods.  Eat small, frequent meals instead of large meals. SEEK IMMEDIATE MEDICAL CARE IF:   You have black or dark red stools.  You vomit blood or material that looks like coffee grounds.  You are unable to keep fluids down.  Your abdominal pain gets worse.  You have a fever.  You do not feel better after 1 week.  You have any other questions or concerns. MAKE SURE YOU:  Understand these instructions.  Will watch your condition.  Will get help right away if you are not doing well or get worse. Document Released: 10/10/2001 Document Revised: 04/16/2012 Document Reviewed: 11/29/2011 [facility] Patient Information 2015 [place], [place]. This information is not intended to replace advice given to you by your health care provider. Make sure you discuss any questions you have with your health care provider.  Nausea and Vomiting Nausea is a sick feeling that often comes before throwing up (vomiting). Vomiting is a reflex where stomach contents come out of your mouth. Vomiting can cause severe loss of body fluids (dehydration). Children and elderly adults can become dehydrated quickly, especially if they also have diarrhea. Nausea and vomiting are  symptoms of a condition or disease. It is important to find the cause of your symptoms. CAUSES   Direct irritation of the stomach lining. This irritation can result from increased acid production (gastroesophageal reflux disease), infection, food  poisoning, taking certain medicines (such as nonsteroidal anti-inflammatory drugs), alcohol use, or tobacco use.  Signals from the brain.These signals could be caused by a headache, heat exposure, an inner ear disturbance, increased pressure in the brain from injury, infection, a tumor, or a concussion, pain, emotional stimulus, or metabolic problems.  An obstruction in the gastrointestinal tract (bowel obstruction).  Illnesses such as diabetes, hepatitis, gallbladder problems, appendicitis, kidney problems, cancer, sepsis, atypical symptoms of a heart attack, or eating disorders.  Medical treatments such as chemotherapy and radiation.  Receiving medicine that makes you sleep (general anesthetic) during surgery. DIAGNOSIS Your caregiver may ask for tests to be done if the problems do not improve after a few days. Tests may also be done if symptoms are severe or if the reason for the nausea and vomiting is not clear. Tests may include:  Urine tests.  Blood tests.  Stool tests.  Cultures (to look for evidence of infection).  X-rays or other imaging studies. Test results can help your caregiver make decisions about treatment or the need for additional tests. TREATMENT You need to stay well hydrated. Drink frequently but in small amounts.You may wish to drink water, sports drinks, clear broth, or eat frozen ice pops or gelatin dessert to help stay hydrated.When you eat, eating slowly may help prevent nausea.There are also some antinausea medicines that may help prevent nausea. HOME CARE INSTRUCTIONS   Take all medicine as directed by your caregiver.  If you do not have an appetite, do not force yourself to eat. However, you must continue to drink fluids.  If you have an appetite, eat a normal diet unless your caregiver tells you differently.  Eat a variety of complex carbohydrates (rice, wheat, potatoes, bread), lean meats, yogurt, fruits, and vegetables.  Avoid high-fat foods  because they are more difficult to digest.  Drink enough water and fluids to keep your urine clear or pale yellow.  If you are dehydrated, ask your caregiver for specific rehydration instructions. Signs of dehydration may include:  Severe thirst.  Dry lips and mouth.  Dizziness.  Dark urine.  Decreasing urine frequency and amount.  Confusion.  Rapid breathing or pulse. SEEK IMMEDIATE MEDICAL CARE IF:   You have blood or brown flecks (like coffee grounds) in your vomit.  You have black or bloody stools.  You have a severe headache or stiff neck.  You are confused.  You have severe abdominal pain.  You have chest pain or trouble breathing.  You do not urinate at least once every 8 hours.  You develop cold or clammy skin.  You continue to vomit for longer than 24 to 48 hours.  You have a fever. MAKE SURE YOU:   Understand these instructions.  Will watch your condition.  Will get help right away if you are not doing well or get worse. Document Released: 10/16/2005 Document Revised: 01/08/2012 Document Reviewed: 03/15/2011 [facility] Patient Information 2015 [place], [place]. This information is not intended to replace advice given to you by your health care provider. Make sure you discuss any questions you have with your health care provider.  Peptic Ulcer A peptic ulcer is a sore in the lining of your esophagus (esophageal ulcer), stomach (gastric ulcer), or in the first part of your small intestine (  duodenal ulcer). The ulcer causes erosion into the deeper tissue. CAUSES  Normally, the lining of the stomach and the small intestine protects itself from the acid that digests food. The protective lining can be damaged by:  An infection caused by a bacterium called Helicobacter pylori (H. pylori).  Regular use of nonsteroidal anti-inflammatory drugs (NSAIDs), such as ibuprofen or aspirin.  Smoking tobacco. Other risk factors include being older than 50, drinking  alcohol excessively, and having a family history of ulcer disease.  SYMPTOMS   Burning pain or gnawing in the area between the chest and the belly button.  Heartburn.  Nausea and vomiting.  Bloating. The pain can be worse on an empty stomach and at night. If the ulcer results in bleeding, it can cause:  Black, tarry stools.  Vomiting of bright red blood.  Vomiting of coffee-ground-looking materials. DIAGNOSIS  A diagnosis is usually made based upon your history and an exam. Other tests and procedures may be performed to find the cause of the ulcer. Finding a cause will help determine the best treatment. Tests and procedures may include:  Blood tests, stool tests, or breath tests to check for the bacterium H. pylori.  An upper gastrointestinal (GI) series of the esophagus, stomach, and small intestine.  An endoscopy to examine the esophagus, stomach, and small intestine.  A biopsy. TREATMENT  Treatment may include:  Eliminating the cause of the ulcer, such as smoking, NSAIDs, or alcohol.  Medicines to reduce the amount of acid in your digestive tract.  Antibiotic medicines if the ulcer is caused by the H. pylori bacterium.  An upper endoscopy to treat a bleeding ulcer.  Surgery if the bleeding is severe or if the ulcer created a hole somewhere in the digestive system. HOME CARE INSTRUCTIONS   Avoid tobacco, alcohol, and caffeine. Smoking can increase the acid in the stomach, and continued smoking will impair the healing of ulcers.  Avoid foods and drinks that seem to cause discomfort or aggravate your ulcer.  Only take medicines as directed by your caregiver. Do not substitute over-the-counter medicines for prescription medicines without talking to your caregiver.  Keep any follow-up appointments and tests as directed. SEEK MEDICAL CARE IF:   Your do not improve within 7 days of starting treatment.  You have ongoing indigestion or heartburn. SEEK IMMEDIATE MEDICAL  CARE IF:   You have sudden, sharp, or persistent abdominal pain.  You have bloody or dark black, tarry stools.  You vomit blood or vomit that looks like coffee grounds.  You become light-headed, weak, or feel faint.  You become sweaty or clammy. MAKE SURE YOU:   Understand these instructions.  Will watch your condition.  Will get help right away if you are not doing well or get worse. Document Released: 10/13/2000 Document Revised: 03/02/2014 Document Reviewed: 05/15/2012 [facility] Patient Information 2015 [place], [place]. This information is not intended to replace advice given to you by your health care provider. Make sure you discuss any questions you have with your health care provider.

## 2015-08-04 ENCOUNTER — Encounter: Payer: Self-pay | Admitting: Emergency Medicine

## 2015-08-04 ENCOUNTER — Emergency Department
Admission: EM | Admit: 2015-08-04 | Discharge: 2015-08-04 | Disposition: A | Discharge status: Home / Self Care | Payer: Medicaid Other | Source: Home / Self Care | Attending: Emergency Medicine | Admitting: Emergency Medicine

## 2015-08-04 DIAGNOSIS — E876 Hypokalemia: Secondary | ICD-10-CM

## 2015-08-04 DIAGNOSIS — R1115 Cyclical vomiting syndrome unrelated to migraine: Secondary | ICD-10-CM

## 2015-08-04 DIAGNOSIS — R1013 Epigastric pain: Secondary | ICD-10-CM

## 2015-08-04 LAB — CBC WITH DIFFERENTIAL/PLATELET
BASOS ABS: 0 10*3/uL (ref 0–0.1)
Basophils Relative: 1 %
Eosinophils Absolute: 0 10*3/uL (ref 0–0.7)
Eosinophils Relative: 0 %
HEMATOCRIT: 37.9 % (ref 35.0–47.0)
Hemoglobin: 12.4 g/dL (ref 12.0–16.0)
LYMPHS PCT: 23 %
Lymphs Abs: 1.9 10*3/uL (ref 1.0–3.6)
MCH: 28.8 pg (ref 26.0–34.0)
MCHC: 32.6 g/dL (ref 32.0–36.0)
MCV: 88.2 fL (ref 80.0–100.0)
MONO ABS: 0.9 10*3/uL (ref 0.2–0.9)
MONOS PCT: 11 %
NEUTROS ABS: 5.5 10*3/uL (ref 1.4–6.5)
Neutrophils Relative %: 65 %
Platelets: 275 10*3/uL (ref 150–440)
RBC: 4.29 MIL/uL (ref 3.80–5.20)
RDW: 14.4 % (ref 11.5–14.5)
WBC: 8.4 10*3/uL (ref 3.6–11.0)

## 2015-08-04 LAB — COMPREHENSIVE METABOLIC PANEL
ALK PHOS: 57 U/L (ref 38–126)
ALT: 15 U/L (ref 14–54)
AST: 12 U/L — AB (ref 15–41)
Albumin: 4.7 g/dL (ref 3.5–5.0)
Anion gap: 11 (ref 5–15)
BILIRUBIN TOTAL: 0.6 mg/dL (ref 0.3–1.2)
BUN: 11 mg/dL (ref 6–20)
CALCIUM: 9.8 mg/dL (ref 8.9–10.3)
CO2: 29 mmol/L (ref 22–32)
CREATININE: 0.67 mg/dL (ref 0.44–1.00)
Chloride: 99 mmol/L — ABNORMAL LOW (ref 101–111)
GFR calc Af Amer: >60 mL/min (ref >60)
Glucose, Bld: 93 mg/dL (ref 65–99)
Potassium: 2.8 mmol/L — CL (ref 3.5–5.1)
Sodium: 139 mmol/L (ref 135–145)
TOTAL PROTEIN: 7.9 g/dL (ref 6.5–8.1)

## 2015-08-04 LAB — URINALYSIS COMPLETE WITH MICROSCOPIC (ARMC ONLY)
BILIRUBIN URINE: NEGATIVE
Bacteria, UA: NONE SEEN
GLUCOSE, UA: NEGATIVE mg/dL
Leukocytes, UA: NEGATIVE
Nitrite: NEGATIVE
Protein, ur: 100 mg/dL — AB
Specific Gravity, Urine: 1.027 (ref 1.005–1.030)
pH: 6 (ref 5.0–8.0)

## 2015-08-04 LAB — LIPASE, BLOOD: LIPASE: 31 U/L (ref 22–51)

## 2015-08-04 LAB — PREGNANCY, URINE: PREG TEST UR: NEGATIVE

## 2015-08-04 MED ORDER — POTASSIUM CHLORIDE CRYS ER 20 MEQ PO TBCR
40.0000 meq | EXTENDED_RELEASE_TABLET | Freq: Once | ORAL | Status: AC
  Administered 2015-08-04: 40 meq via ORAL
  Filled 2015-08-04: qty 2

## 2015-08-04 MED ORDER — HYDROMORPHONE HCL 2 MG PO TABS: 2.0000 mg | ORAL_TABLET | ORAL | Status: DC | PRN

## 2015-08-04 MED ORDER — LORAZEPAM 2 MG/ML IJ SOLN
1.0000 mg | Freq: Once | INTRAMUSCULAR | Status: AC
  Administered 2015-08-04: 1 mg via INTRAVENOUS
  Filled 2015-08-04: qty 1

## 2015-08-04 MED ORDER — MORPHINE SULFATE (PF) 4 MG/ML IV SOLN
4.0000 mg | Freq: Once | INTRAVENOUS | Status: AC
  Administered 2015-08-04: 4 mg via INTRAVENOUS
  Filled 2015-08-04: qty 1

## 2015-08-04 MED ORDER — PROMETHAZINE HCL 25 MG/ML IJ SOLN
25.0000 mg | Freq: Once | INTRAMUSCULAR | Status: AC
Start: 2015-08-04 — End: 2015-08-04
  Administered 2015-08-04: 25 mg via INTRAVENOUS
  Filled 2015-08-04: qty 1

## 2015-08-04 MED ORDER — PROMETHAZINE HCL 25 MG RE SUPP: 25.0000 mg | Freq: Four times a day (QID) | RECTAL | Status: DC | PRN

## 2015-08-04 MED ORDER — SODIUM CHLORIDE 0.9 % IV BOLUS (SEPSIS)
2000.0000 mL | Freq: Once | INTRAVENOUS | Status: AC
  Administered 2015-08-04: 2000 mL via INTRAVENOUS

## 2015-08-04 MED ORDER — HALOPERIDOL LACTATE 5 MG/ML IJ SOLN
2.0000 mg | Freq: Once | INTRAMUSCULAR | Status: AC
  Administered 2015-08-04: 2 mg via INTRAVENOUS
  Filled 2015-08-04: qty 1

## 2015-08-04 MED ORDER — POTASSIUM CHLORIDE 10 MEQ/100ML IV SOLN
10.0000 meq | INTRAVENOUS | Status: AC
  Administered 2015-08-04 (×3): 10 meq via INTRAVENOUS
  Filled 2015-08-04 (×3): qty 100

## 2015-08-04 NOTE — ED Notes (Signed)
Reviewed d/c instructions, medications and follow-up care with pt, who voiced understanding. Pt called her mother for a ride, departed under her own power, and in NAD.

## 2015-08-04 NOTE — ED Provider Notes (Signed)
CSN: [id number]     Arrival date & time 08/04/15  0713 History   First MD Initiated Contact with Patient 08/04/15 0725     Chief Complaint  Patient presents with  . Nausea  . Emesis  . Abdominal Pain     (Consider location/radiation/quality/duration/timing/severity/associated sxs/prior Treatment) The history is provided by the patient.  Laurie Frederick is a 39 y.o. female hx of GERD, anxiety, H pylori with gastric ulcers here with abdominal pain, vomiting. Patient was seen multiple times over the last month for the same symptoms. She has had 2 admissions last month and had a CT abdomen pelvis that was unremarkable as well as a GI consult and the EGD that shows H pylori with some ulcers. She was sent home and finished a course of amoxicillin, still taking clarithromycin. She didn't fill her prescription for any nausea medicine. Came here multiple times over the last several weeks, most recently yesterday for the same symptoms. She was given prescription of Phenergan but did not fill it and has been vomiting all night. Also has persistent abdominal pain and didn't take her Seroquel or Valium. She states that she has been anxious as well. She has 2 kids at home and requesting to be admitted for persistent symptoms.   Past Medical History  Diagnosis Date  . Sleep apnea   . GERD (gastroesophageal reflux disease)   . Hyperlipidemia   . Depression   . Anxiety   . PTSD (post-traumatic stress disorder)   . Obesity   . Anemia   . Vitamin D deficiency   . Nontoxic uninodular goiter    Past Surgical History  Procedure Laterality Date  . Cesarean section    . Esophagogastroduodenoscopy (egd) with propofol Left 07/07/2015    Procedure: ESOPHAGOGASTRODUODENOSCOPY (EGD) WITH PROPOFOL;  Surgeon: Scot Jun, MD;  Location: [facility] ENDOSCOPY;  Service: Endoscopy;  Laterality: Left;  . Cesarean section     Family History  Problem Relation Age of Onset  . Lung cancer Father   . Lung cancer  Paternal Aunt   . Bone cancer Paternal Uncle   . Depression Mother   . Anxiety disorder Mother   . Arthritis Mother   . Hyperlipidemia Mother   . Cancer Father     lung cancer  . Alcohol abuse Father   . Hypertension Maternal Grandmother   . Diabetes Paternal Grandmother   . Heart disease Paternal Grandmother   . Hypertension Paternal Grandmother    Social History  Substance Use Topics  . Smoking status: Current Some Day Smoker -- 0.50 packs/day    Types: Cigarettes  . Smokeless tobacco: None  . Alcohol Use: Yes     Comment: occasional   OB History    Gravida Para Term Preterm AB TAB SAB Ectopic Multiple Living   0 0 0 0 0 0 0 0       Review of Systems  Gastrointestinal: Positive for vomiting and abdominal pain.  All other systems reviewed and are negative.     Allergies  Review of patient's allergies indicates no known allergies.  Home Medications   Prior to Admission medications   Medication Sig Start Date End Date Taking? Authorizing Provider  ALPRAZolam Prudy Feeler) 1 MG tablet Take 2 tablets (2 mg total) by mouth 2 (two) times daily. 07/31/15 07/30/16  Emily Filbert, MD  alprazolam Prudy Feeler) 2 MG tablet Take 2 mg by mouth 2 (two) times daily.    Historical Provider, MD  amoxicillin (AMOXIL) 500 MG capsule  Take 2 capsules (1,000 mg total) by mouth 2 (two) times daily. Patient not taking: Reported on 08/03/2015 07/15/15   Milagros Loll, MD  citalopram (CELEXA) 20 MG tablet Take 1 tablet (20 mg total) by mouth at bedtime. 07/09/15   Delfino Lovett, MD  clarithromycin (BIAXIN) 500 MG tablet Take 1 tablet (500 mg total) by mouth 2 (two) times daily. Patient not taking: Reported on 08/03/2015 07/15/15   Milagros Loll, MD  HYDROcodone-acetaminophen (NORCO) 5-325 MG per tablet Take 1 tablet by mouth every 6 (six) hours as needed for moderate pain. 07/12/15   Jennye Moccasin, MD  HYDROcodone-acetaminophen (NORCO/VICODIN) 5-325 MG per tablet Take 1 tablet by mouth every 6 (six) hours as  needed for moderate pain. 07/16/15   Milagros Loll, MD  HYDROmorphone (DILAUDID) 2 MG tablet Take 1 tablet (2 mg total) by mouth every 4 (four) hours as needed for severe pain. 07/09/15   Delfino Lovett, MD  metoCLOPramide (REGLAN) 10 MG tablet Take 1 tablet (10 mg total) by mouth 3 (three) times daily with meals. 07/05/15 07/04/16  Irean Hong, MD  metoCLOPramide (REGLAN) 10 MG tablet Take 1 tablet (10 mg total) by mouth 3 (three) times daily with meals. 08/03/15 08/02/16  Rebecka Apley, MD  nicotine (NICODERM CQ - DOSED IN MG/24 HR) 7 mg/24hr patch Place 1 patch (7 mg total) onto the skin daily. 07/16/15   Srikar Sudini, MD  pantoprazole (PROTONIX) 40 MG tablet Take 1 tablet (40 mg total) by mouth daily. 07/09/15   Delfino Lovett, MD  pantoprazole (PROTONIX) 40 MG tablet Take 1 tablet (40 mg total) by mouth 2 (two) times daily before a meal. 07/15/15   Milagros Loll, MD  promethazine (PHENERGAN) 25 MG suppository Place 1 suppository (25 mg total) rectally every 6 (six) hours as needed for nausea or vomiting. Patient not taking: Reported on 08/03/2015 07/12/15   Jennye Moccasin, MD  promethazine (PHENERGAN) 25 MG tablet Take 1 tablet (25 mg total) by mouth every 4 (four) hours as needed for nausea or vomiting. 07/31/15   Emily Filbert, MD  QUEtiapine (SEROQUEL) 50 MG tablet Take 1 tablet (50 mg total) by mouth at bedtime. 07/09/15   Delfino Lovett, MD  QUEtiapine (SEROQUEL) 50 MG tablet Take 1 tablet (50 mg total) by mouth at bedtime. 07/31/15   Emily Filbert, MD  sucralfate (CARAFATE) 1 G tablet Take 1 tablet (1 g total) by mouth 4 (four) times daily -  with meals and at bedtime. Dissolve in water and take it. 07/09/15   Delfino Lovett, MD  sucralfate (CARAFATE) 1 G tablet Take 1 tablet (1 g total) by mouth 2 (two) times daily. 08/03/15 08/02/16  Rebecka Apley, MD  traMADol (ULTRAM) 50 MG tablet Take 1 tablet (50 mg total) by mouth every 12 (twelve) hours as needed for moderate pain. 07/09/15   Delfino Lovett, MD  zolpidem  (AMBIEN) 10 MG tablet Take 10 mg by mouth at bedtime.    Historical Provider, MD   BP 168/100 mmHg  Pulse 84  Temp(Src) 97.4 F (36.3 C) (Oral)  Resp 16  Ht 5\' 3"  (1.6 m)  Wt 181 lb (82.101 kg)  BMI 32.07 kg/m2  SpO2 99%  LMP 08/02/2015 (Exact Date) Physical Exam  Constitutional: She is oriented to person, place, and time.  Uncomfortable, dehydrated   HENT:  Head: Normocephalic.  MM slightly dry   Eyes: Conjunctivae are normal. Pupils are equal, round, and reactive to light.  Neck: Normal range of  motion. Neck supple.  Cardiovascular: Normal rate, regular rhythm and normal heart sounds.   Pulmonary/Chest: Effort normal and breath sounds normal. No respiratory distress. She has no wheezes. She has no rales.  Abdominal: Soft. Bowel sounds are normal.  Minimal epigastric tenderness   Musculoskeletal: Normal range of motion. She exhibits no edema.  Neurological: She is alert and oriented to person, place, and time.  Skin: Skin is warm and dry.  Psychiatric: She has a normal mood and affect. Her behavior is normal. Thought content normal.  Nursing note and vitals reviewed.   ED Course  Procedures (including critical care time) Labs Review Labs Reviewed  COMPREHENSIVE METABOLIC PANEL - Abnormal; Notable for the following:    Potassium 2.8 (*)    Chloride 99 (*)    AST 12 (*)    All other components within normal limits  URINALYSIS COMPLETEWITH MICROSCOPIC ([facility] ONLY) - Abnormal; Notable for the following:    Color, Urine YELLOW (*)    APPearance HAZY (*)    Ketones, ur 2+ (*)    Hgb urine dipstick 3+ (*)    Protein, ur 100 (*)    Squamous Epithelial / LPF 6-30 (*)    All other components within normal limits  CBC WITH DIFFERENTIAL/PLATELET  LIPASE, BLOOD  PREGNANCY, URINE    Imaging Review Dg Chest 1 View  08/03/2015   CLINICAL DATA:  Initial evaluation for left-sided chest pain since Monday. Evaluate for free air.  EXAM: CHEST 1 VIEW  COMPARISON:  Prior radiograph  from 07/31/2015.  FINDINGS: The cardiac and mediastinal silhouettes are stable in size and contour, and remain within normal limits.  The lungs are normally inflated. No airspace consolidation, pleural effusion, or pulmonary edema is identified. There is no pneumothorax.  No acute osseous abnormality identified. No free air seen underneath the hemidiaphragms on this upright projection.  IMPRESSION: 1. No radiographic evidence for free intraperitoneal air on this single upright view of the chest. 2. No active cardiopulmonary disease.   Electronically Signed   By: Rise Mu M.D.   On: 08/03/2015 06:59   I have personally reviewed and evaluated these images and lab results as part of my medical decision-making.   EKG Interpretation None      MDM   Final diagnoses:  None   Laurie Frederick is a 39 y.o. female here with vomiting. Was seen yesterday for same thing and didn't fill prescription. Patient had multiple admissions in the past month for same symptoms. Likely cyclic vomiting. Will check labs.   12:25 PM UA showed ketone. K 2.8, given potassium. Able to tolerate potassium pills with no vomiting. Tolerated some food. Given 3 potassium runs. I had case manager see her. She has medicaid so I encourage her to have someone pick up prescription. Will prescribe phenergan suppository as well and pain meds. BP improved with pain meds.     Richardean Canal, MD 08/04/15 [phone]

## 2015-08-04 NOTE — Care Management Note (Signed)
Case Management Note  Patient Details  Name: Adlene Adduci MRN: [id number] Date of Birth: Apr 06, 1976  Subjective/Objective:    Spoke to patient at length about her illness, and lack of follow up. She admits ahe gets anxious when vomitng and it is a cycle. She states she would like to come into the hospital for 3-4 days, as she feels that would give her time to get better. I have explained to her that we have rules to follow that tell us when we can bring people into the hospital, and per the MD right now, she does not fall unde4r those rules. She talks about her mental illness and again states her anxiety is higher when she feels sick. I have again re-enforced that she needs to take the medications as prescribed to feel better. I have also explained to her that if she needs to have suppository form we can ask the MD to do that. She does say she has a case worker with her medicaid , but she does not remember the name of that person. I have e-mailed Roderic Palau at [facility] to see  Who follows her and might be able to give support.               Action/Plan:   Expected Discharge Date:                  Expected Discharge Plan:     In-House Referral:     Discharge planning Services     Post Acute Care Choice:    Choice offered to:     DME Arranged:    DME Agency:     HH Arranged:    HH Agency:     Status of Service:     Medicare Important Message Given:    Date Medicare IM Given:    Medicare IM give by:    Date Additional Medicare IM Given:    Additional Medicare Important Message give by:     If discussed at Long Length of Stay Meetings, dates discussed:    Additional Comments:  Berna Bue, RN 08/04/2015, 9:02 AM

## 2015-08-04 NOTE — Discharge Instructions (Signed)
Stay hydrated.   Take dilaudid for pain. Do NOT drive with it.   Take phenergan suppository for nausea or vomiting.   See your doctor. Call GI for appointment.   Recheck potassium in a week.   Return to ER if you have uncontrolled pain, persistent vomiting, fever.

## 2015-08-04 NOTE — ED Notes (Signed)
Pt presents with abd pain, n/v/d. Pt just here yesterday and was dx with H Pylori, pt sent home with rx and did not get filled. Pt states she is not feeling any better.  Pt had labs drawn yesterday.

## 2015-08-04 NOTE — Care Management Note (Signed)
Case Management Note  Patient Details  Name: Laurie Frederick MRN: [id number] Date of Birth: April 11, 1976  Subjective/Objective:  Spoke to patient again at bedside at MD says the patient will be discharged . She has been treated for her low K. She is stating again she wants to stay in hospital for 2-3 days to get her nausea sorted out, and work on her gastric inflammation identified in her EGD earlier in September.   She also verbalizes she will need to go to a GI MD again for followup,  and that she feels she is in too much pain to go through her PCP which is required under her insurance    . I have explained to her that this   Not different from anything most of [place] would have to do, and that the process is there for her safety, and that of others. We cannot commit fraud by admitting her to the hospital without a diagnosis that is accepted as one for admisswion. That is against the law and the MD is deeming her fit for discharge to home, but she MUST follow directions and get the meds prescribed for her at discharge filled this time.  Action/Plan:   Expected Discharge Date:                  Expected Discharge Plan:     In-House Referral:     Discharge planning Services     Post Acute Care Choice:    Choice offered to:     DME Arranged:    DME Agency:     HH Arranged:    HH Agency:     Status of Service:     Medicare Important Message Given:    Date Medicare IM Given:    Medicare IM give by:    Date Additional Medicare IM Given:    Additional Medicare Important Message give by:     If discussed at Long Length of Stay Meetings, dates discussed:    Additional Comments:  Berna Bue, RN 08/04/2015, 1:08 PM

## 2015-08-05 ENCOUNTER — Other Ambulatory Visit: Payer: Self-pay | Admitting: Physician Assistant

## 2015-08-05 DIAGNOSIS — R932 Abnormal findings on diagnostic imaging of liver and biliary tract: Secondary | ICD-10-CM

## 2015-08-12 ENCOUNTER — Inpatient Hospital Stay: Payer: Medicaid Other | Admitting: Family Medicine

## 2015-08-13 ENCOUNTER — Ambulatory Visit: Admission: RE | Admit: 2015-08-13 | Payer: Medicaid Other | Source: Ambulatory Visit

## 2015-08-16 ENCOUNTER — Encounter: Payer: Self-pay | Admitting: Family Medicine

## 2015-08-16 ENCOUNTER — Ambulatory Visit (INDEPENDENT_AMBULATORY_CARE_PROVIDER_SITE_OTHER): Payer: Medicaid Other | Admitting: Family Medicine

## 2015-08-16 VITALS — BP 119/68 | HR 110 | Temp 99.4°F | Ht 63.0 in | Wt 172.0 lb

## 2015-08-16 DIAGNOSIS — R1013 Epigastric pain: Secondary | ICD-10-CM | POA: Diagnosis not present

## 2015-08-16 DIAGNOSIS — Z79899 Other long term (current) drug therapy: Secondary | ICD-10-CM

## 2015-08-16 DIAGNOSIS — B9681 Helicobacter pylori [H. pylori] as the cause of diseases classified elsewhere: Secondary | ICD-10-CM

## 2015-08-16 DIAGNOSIS — K7689 Other specified diseases of liver: Secondary | ICD-10-CM

## 2015-08-16 DIAGNOSIS — E876 Hypokalemia: Secondary | ICD-10-CM | POA: Insufficient documentation

## 2015-08-16 DIAGNOSIS — F321 Major depressive disorder, single episode, moderate: Secondary | ICD-10-CM

## 2015-08-16 DIAGNOSIS — K2970 Gastritis, unspecified, without bleeding: Secondary | ICD-10-CM

## 2015-08-16 DIAGNOSIS — F1010 Alcohol abuse, uncomplicated: Secondary | ICD-10-CM | POA: Diagnosis not present

## 2015-08-16 NOTE — Assessment & Plan Note (Signed)
Check K+ and Mg2+ today; last level in the ER was 2.8 on October 5th

## 2015-08-16 NOTE — Assessment & Plan Note (Signed)
Managed by psychiatrist

## 2015-08-16 NOTE — Assessment & Plan Note (Addendum)
Unfortunately, she has not picked up the antibiotics prescribed by gastroenterologist on October 6th; she complains that she is in pain from her gastritis, but isn't treating it; I don't know how much of this is either conscious or subconscious so that she can continue to seek pain medicine; I explained that the GI doctor needs to know about this, her level of pain and her not completing her treatment for the H pylori; there could be something serious and she might need to have another scope / procedure done to look at things; I urged her to please call Dr. Nigel Bridgemanh's office when she leaves here to talk with them; I will let that office be the primary caretaker for this problem; stressed to patient seriousness, that she could have an ulcer getting ready to bleed, and that if she vomits blood, has blood in the stool, seek medical care immediately; office manager present for that part of the visit

## 2015-08-16 NOTE — Assessment & Plan Note (Signed)
Abdominal exam does not suggest surgical abdomen; stressed to patient the need to f/u with GI right away; reasons to go immediately to the ER include vomiting blood, blood in the stool, etc.

## 2015-08-16 NOTE — Patient Instructions (Addendum)
Please take all of your medicine bottles up to your pharmacy and sit down with your pharmacist and try to figure out if there is a problem with the instructions for your H pylori treatment; tou were given a prescription for a 10 day supply of antibiotics by the GI provider on October 6th and you should be done with that now, so please work with the GI doctor as to how best to treat your H pylori at this point  You absolutely need to take BOTH antibiotics, not one and then the other; you have to take them together as directed by your gastroenterologist  Please contact Dr. Nigel Bridgemanh's office about your pain TODAY; they need to know what your symptoms are; if you are not taking your medicine, they may need to repeat a scope and take a look to see what's going on; this can be serious, so call them as soon as you are done here today  I also need to let you explain to them how you have not started the 10 day course of antibiotics to treat your H pylori; they need to know this so they can decide if they need to see you today or set you up for a repeat procedure to look at your esophagus and stomach  You absolutely need to finish your treatment for your H pylori infection; work that out with Dr. Nigel Bridgemanh's office  I am worried about your use of narcotics; based on your gastroenterology provider, Tylenol should be sufficient to meet your pain needs; if not, the gastroenterologist needs to know this  If you mix the narcotics and sleeping pills and anxiety pills, even if under a doctor's direction, that can be dangerous and lead to a fatal unintentional overdose; you could die if you mix pain medicine plus sleeping pills and anxiety pills  It is illegal to obtain narcotics without a prescription, even if from a well-meaning friend or family member; it is absolutely illegal and dangerous to take medicines that are not prescribed for you; do NOT risk it  Do not drink ANY alcohol, especially with the anxiety medicine and  sleeping medicine that you take; drinking alcohol plus taking anxiety and/or sleeping pills could kill you  I will be happy to see you about your back, so feel free to make an appointment to see me about your back if you wish, or you are welcome to see another provider if you desire; I respect your wishes  Please [facility] to let them know about your financial difficulties and see if there is another way they can help you

## 2015-08-16 NOTE — Assessment & Plan Note (Signed)
Gastroenterologist has ordered MRI follow-up

## 2015-08-16 NOTE — Assessment & Plan Note (Addendum)
Patient had a blood alcohol level of 161 June 1st; I will recheck level today, but she says she has not had any alcohol for months

## 2015-08-16 NOTE — Assessment & Plan Note (Signed)
Managed by psychiatrist; I explained the danger of mixing benzodiazepines with narcotics, risk of unintentional overdose; I will not prescribe narcotics for her and I urged her to not take any narcotics from any other source because of the risk of death, accidental fatal overdose even if taken under direction of doctor; I offered her help to come off of any of these medicines if she felt like she was taking them inappropriately; she says she needs the psych medicines and cited reasons going back to age 89 as to why she needs them; that will be between her and her psychiatrist

## 2015-08-16 NOTE — Progress Notes (Signed)
BP 119/68 mmHg  Pulse 110  Temp(Src) 99.4 F (37.4 C)  Ht 5\' 3"  (1.6 m)  Wt 172 lb (78.019 kg)  BMI 30.48 kg/m2  SpO2 100%  LMP 08/02/2015 (Exact Date)   Subjective:    Patient ID: Laurie Frederick, female    DOB: August 21, 1976, 39 y.o.   MRN: [id number]  HPI: Laurie Frederick is a 39 y.o. female  Chief Complaint  Patient presents with  . Hospitalization Follow-up    she was seen for H Pylori, Gastritis.   Patient is here for hospital follow-up She has been in and out of the hospital several times since she was seen last here Chart reviewed; she was in the ER at [facility] on October 5th, not admitted; had been seen on October 1st, October 3rd, October 4th, and October 5th She had been admitted to the hospital Sept 21st, and was admitted to the hospital on September 13th, as well as September 5th See other visits; 12 visits total to the ER since May 21st  She has H pylori, and gastritis, and ulcers; they ran a scope and they saw ulcers; she had an appt last week for the GI doctor; she said she didn't have time to set up an appointment here; she started to talk about her back and I redirected her  She sees Oh for her stomach; she says they will call when they get the appointment; she says they don't give pain medicine and she is supposed to see her primary doctor for pain medicine ; She is avoiding advil and motrin; avoiding tomato and pasta; she does not drink any alcohol; her last drink of alcohol was December, then she said maybe a beer or two in between; last alcohol was months ago; staying away from coffee, but having a hard time with soda  Chart reviewed; last alcohol level was 161 on March 31, 2015  I spoke with GI nurse practitioner, Lyla Son, today after stepping out of the room to get an update from their standpoint, did they really intend for me to prescribe her narcotics? Patient is well-known to the provider She had EGD, saw her in the hospital twice GI extended the  antibiotics; they saw her on October 6th; she was given  She had not taking her antibiotics properly based on their count of pills She still had not filled the protonix when they saw her last This is not an indication for narcotics, breakthrough pain can be managed with tylenol; she already seems medicated They did order MRI of the abdomen to follow-up on abnormality  She says she has been out of her anxiety medicine; I asked how she ran out of that; I reviewed the [employer] website; she just got her psych medicines; she can't everything at one time; she will go sometimes without her anxiety medicine for a week; she says she has it now  I asked about her use of zolpidem and alprazolam; I asked if she takes them appropriately; she is taking 10 mg zolpidem instead of the 5 mg; psychiatrist is prescribing those; he wants her to break them in half but she is taking a whole pill; she says that she needs narcotics while she is going through this; she says she will get narcotics however she can get them if she can't get them here; she will not sit at home; she'll will do what she needs to do to treat her pain  She talked again about not having the co-pay for all of  her medicines; we talked more about how she should have finished the 10 day course of treatment for her H pylori, and she says she has not filled the prescription from October 6th; she says that she needed to get her anxiety medicine and so the 10 day course of H pylori treatment is still at the pharmacy  Relevant past medical, surgical, family and social history reviewed and updated as indicated. Interim medical history since our last visit reviewed. Allergies and medications reviewed and updated.  Review of Systems  Per HPI unless specifically indicated above     Objective:    BP 119/68 mmHg  Pulse 110  Temp(Src) 99.4 F (37.4 C)  Ht 5\' 3"  (1.6 m)  Wt 172 lb (78.019 kg)  BMI 30.48 kg/m2  SpO2 100%  LMP 08/02/2015 (Exact Date)  Wt  Readings from Last 3 Encounters:  08/16/15 172 lb (78.019 kg)  08/04/15 181 lb (82.101 kg)  08/03/15 181 lb (82.101 kg)    Physical Exam  Constitutional: She appears well-developed and well-nourished. No distress.  HENT:  Head: Normocephalic and atraumatic.  Eyes: EOM are normal. No scleral icterus.  Neck: No thyromegaly present.  Cardiovascular: Regular rhythm and normal heart sounds.  Tachycardia present.   No murmur heard. Pulmonary/Chest: Effort normal and breath sounds normal. No respiratory distress. She has no wheezes.  Abdominal: Soft. Normal appearance and bowel sounds are normal. She exhibits no distension. There is tenderness in the epigastric area. There is no guarding.  Musculoskeletal: Normal range of motion. She exhibits no edema.  Neurological: She is alert. She exhibits normal muscle tone.  Skin: Skin is warm and dry. She is not diaphoretic. No pallor.  Psychiatric: Her mood appears anxious. She is combative. She is not slowed and not withdrawn.  Good eye contact with examiner; some anxiety noted with occasional facial twitching when discussing certain things; appeared frustrated at times; limited insight I believe   Results for orders placed or performed during the hospital encounter of 08/04/15  CBC with Differential/Platelet  Result Value Ref Range   WBC 8.4 3.6 - 11.0 K/uL   RBC 4.29 3.80 - 5.20 MIL/uL   Hemoglobin 12.4 12.0 - 16.0 g/dL   HCT 16.137.9 09.635.0 - 04.547.0 %   MCV 88.2 80.0 - 100.0 fL   MCH 28.8 26.0 - 34.0 pg   MCHC 32.6 32.0 - 36.0 g/dL   RDW 40.914.4 81.111.5 - 91.414.5 %   Platelets 275 150 - 440 K/uL   Neutrophils Relative % 65 %   Neutro Abs 5.5 1.4 - 6.5 K/uL   Lymphocytes Relative 23 %   Lymphs Abs 1.9 1.0 - 3.6 K/uL   Monocytes Relative 11 %   Monocytes Absolute 0.9 0.2 - 0.9 K/uL   Eosinophils Relative 0 %   Eosinophils Absolute 0.0 0 - 0.7 K/uL   Basophils Relative 1 %   Basophils Absolute 0.0 0 - 0.1 K/uL  Comprehensive metabolic panel  Result Value  Ref Range   Sodium 139 135 - 145 mmol/L   Potassium 2.8 (LL) 3.5 - 5.1 mmol/L   Chloride 99 (L) 101 - 111 mmol/L   CO2 29 22 - 32 mmol/L   Glucose, Bld 93 65 - 99 mg/dL   BUN 11 6 - 20 mg/dL   Creatinine, Ser 7.820.67 0.44 - 1.00 mg/dL   Calcium 9.8 8.9 - 95.610.3 mg/dL   Total Protein 7.9 6.5 - 8.1 g/dL   Albumin 4.7 3.5 - 5.0 g/dL   AST 12 (  L) 15 - 41 U/L   ALT 15 14 - 54 U/L   Alkaline Phosphatase 57 38 - 126 U/L   Total Bilirubin 0.6 0.3 - 1.2 mg/dL   GFR calc non Af Amer >60 >60 mL/min   GFR calc Af Amer >60 >60 mL/min   Anion gap 11 5 - 15  Lipase, blood  Result Value Ref Range   Lipase 31 22 - 51 U/L  Urinalysis complete, with microscopic ([facility] only)  Result Value Ref Range   Color, Urine YELLOW (A) YELLOW   APPearance HAZY (A) CLEAR   Glucose, UA NEGATIVE NEGATIVE mg/dL   Bilirubin Urine NEGATIVE NEGATIVE   Ketones, ur 2+ (A) NEGATIVE mg/dL   Specific Gravity, Urine 1.027 1.005 - 1.030   Hgb urine dipstick 3+ (A) NEGATIVE   pH 6.0 5.0 - 8.0   Protein, ur 100 (A) NEGATIVE mg/dL   Nitrite NEGATIVE NEGATIVE   Leukocytes, UA NEGATIVE NEGATIVE   RBC / HPF TOO NUMEROUS TO COUNT 0 - 5 RBC/hpf   WBC, UA 0-5 0 - 5 WBC/hpf   Bacteria, UA NONE SEEN NONE SEEN   Squamous Epithelial / LPF 6-30 (A) NONE SEEN   Mucous PRESENT   Pregnancy, urine  Result Value Ref Range   Preg Test, Ur NEGATIVE NEGATIVE      Assessment & Plan:   Problem List Items Addressed This Visit      Digestive   Helicobacter positive gastritis    Unfortunately, she has not picked up the antibiotics prescribed by gastroenterologist on October 6th; she complains that she is in pain from her gastritis, but isn't treating it; I don't know how much of this is either conscious or subconscious so that she can continue to seek pain medicine; I explained that the GI doctor needs to know about this, her level of pain and her not completing her treatment for the H pylori; there could be something serious and she might need  to have another scope / procedure done to look at things; I urged her to please call Dr. Nigel Bridgeman office when she leaves here to talk with them; I will let that office be the primary caretaker for this problem        Other   Moderate major depression (HCC)    Managed by psychiatrist      Alcohol abuse    Patient had a blood alcohol level of 161 June 1st; I will recheck level today, but she says she has not had any alcohol for months      Relevant Orders   Ethanol   Hypokalemia - Primary    Check K+ and Mg2+ today; last level in the ER was 2.8 on October 5th      Relevant Orders   Magnesium   Basic metabolic panel   Chronic prescription benzodiazepine use    Managed by psychiatrist; I explained the danger of mixing benzodiazepines with narcotics, risk of unintentional overdose; I will not prescribe narcotics for her and I urged her to not take any narcotics from any other source because of the risk of death, accidental fatal overdose even if taken under direction of doctor; I offered her help to come off of any of these medicines if she felt like she was taking them inappropriately; she says she needs the psych medicines and cited reasons going back to age 32 as to why she needs them; that will be between her and her psychiatrist  Follow up plan: No Follow-up on file.  Face-to-face time with patient was more than 40 minutes, >50% time spent counseling and coordination of care  An after-visit summary was printed and given to the patient at check-out.  Please see the patient instructions which may contain other information and recommendations beyond what is mentioned above in the assessment and plan.  Orders Placed This Encounter  Procedures  . Magnesium  . Ethanol  . Basic metabolic panel

## 2015-08-17 ENCOUNTER — Encounter: Payer: Self-pay | Admitting: Family Medicine

## 2015-08-17 LAB — BASIC METABOLIC PANEL
BUN/Creatinine Ratio: 10 (ref 8–20)
BUN: 6 mg/dL (ref 6–20)
CALCIUM: 9.6 mg/dL (ref 8.7–10.2)
CHLORIDE: 101 mmol/L (ref 97–106)
CO2: 26 mmol/L (ref 18–29)
Creatinine, Ser: 0.6 mg/dL (ref 0.57–1.00)
GFR calc Af Amer: 135 mL/min/{1.73_m2} (ref >59)
GFR calc non Af Amer: 117 mL/min/{1.73_m2} (ref >59)
Glucose: 104 mg/dL — ABNORMAL HIGH (ref 65–99)
POTASSIUM: 4.6 mmol/L (ref 3.5–5.2)
Sodium: 141 mmol/L (ref 136–144)

## 2015-08-17 LAB — ETHANOL: ETHANOL LVL: NEGATIVE %

## 2015-08-17 LAB — MAGNESIUM: MAGNESIUM: 2 mg/dL (ref 1.6–2.3)

## 2015-09-03 ENCOUNTER — Emergency Department: Payer: Medicaid Other

## 2015-09-03 ENCOUNTER — Other Ambulatory Visit: Payer: Self-pay

## 2015-09-03 ENCOUNTER — Encounter: Payer: Self-pay | Admitting: *Deleted

## 2015-09-03 ENCOUNTER — Observation Stay (HOSPITAL_BASED_OUTPATIENT_CLINIC_OR_DEPARTMENT_OTHER)
Admission: EM | Admit: 2015-09-03 | Discharge: 2015-09-05 | Disposition: A | Discharge status: Home / Self Care | Payer: Medicaid Other | Source: Home / Self Care | Attending: Student | Admitting: Student

## 2015-09-03 ENCOUNTER — Emergency Department
Admission: EM | Admit: 2015-09-03 | Discharge: 2015-09-03 | Disposition: A | Discharge status: Home / Self Care | Payer: Medicaid Other | Attending: Emergency Medicine | Admitting: Emergency Medicine

## 2015-09-03 DIAGNOSIS — E785 Hyperlipidemia, unspecified: Secondary | ICD-10-CM | POA: Diagnosis not present

## 2015-09-03 DIAGNOSIS — K219 Gastro-esophageal reflux disease without esophagitis: Secondary | ICD-10-CM | POA: Insufficient documentation

## 2015-09-03 DIAGNOSIS — R109 Unspecified abdominal pain: Secondary | ICD-10-CM

## 2015-09-03 DIAGNOSIS — R112 Nausea with vomiting, unspecified: Secondary | ICD-10-CM | POA: Insufficient documentation

## 2015-09-03 DIAGNOSIS — Z818 Family history of other mental and behavioral disorders: Secondary | ICD-10-CM | POA: Insufficient documentation

## 2015-09-03 DIAGNOSIS — E669 Obesity, unspecified: Secondary | ICD-10-CM | POA: Insufficient documentation

## 2015-09-03 DIAGNOSIS — K529 Noninfective gastroenteritis and colitis, unspecified: Secondary | ICD-10-CM | POA: Insufficient documentation

## 2015-09-03 DIAGNOSIS — E041 Nontoxic single thyroid nodule: Secondary | ICD-10-CM | POA: Diagnosis not present

## 2015-09-03 DIAGNOSIS — Z825 Family history of asthma and other chronic lower respiratory diseases: Secondary | ICD-10-CM | POA: Insufficient documentation

## 2015-09-03 DIAGNOSIS — Z801 Family history of malignant neoplasm of trachea, bronchus and lung: Secondary | ICD-10-CM | POA: Insufficient documentation

## 2015-09-03 DIAGNOSIS — Z800 Family history of malignant neoplasm of digestive organs: Secondary | ICD-10-CM | POA: Diagnosis not present

## 2015-09-03 DIAGNOSIS — K2970 Gastritis, unspecified, without bleeding: Secondary | ICD-10-CM | POA: Insufficient documentation

## 2015-09-03 DIAGNOSIS — Z833 Family history of diabetes mellitus: Secondary | ICD-10-CM | POA: Insufficient documentation

## 2015-09-03 DIAGNOSIS — Z8261 Family history of arthritis: Secondary | ICD-10-CM | POA: Insufficient documentation

## 2015-09-03 DIAGNOSIS — Z6832 Body mass index (BMI) 32.0-32.9, adult: Secondary | ICD-10-CM | POA: Diagnosis not present

## 2015-09-03 DIAGNOSIS — K2900 Acute gastritis without bleeding: Secondary | ICD-10-CM | POA: Insufficient documentation

## 2015-09-03 DIAGNOSIS — K769 Liver disease, unspecified: Secondary | ICD-10-CM | POA: Insufficient documentation

## 2015-09-03 DIAGNOSIS — F419 Anxiety disorder, unspecified: Secondary | ICD-10-CM | POA: Diagnosis not present

## 2015-09-03 DIAGNOSIS — F321 Major depressive disorder, single episode, moderate: Secondary | ICD-10-CM | POA: Diagnosis not present

## 2015-09-03 DIAGNOSIS — I10 Essential (primary) hypertension: Secondary | ICD-10-CM | POA: Insufficient documentation

## 2015-09-03 DIAGNOSIS — Z79899 Other long term (current) drug therapy: Secondary | ICD-10-CM | POA: Insufficient documentation

## 2015-09-03 DIAGNOSIS — E559 Vitamin D deficiency, unspecified: Secondary | ICD-10-CM | POA: Insufficient documentation

## 2015-09-03 DIAGNOSIS — G4730 Sleep apnea, unspecified: Secondary | ICD-10-CM | POA: Insufficient documentation

## 2015-09-03 DIAGNOSIS — Z8249 Family history of ischemic heart disease and other diseases of the circulatory system: Secondary | ICD-10-CM | POA: Diagnosis not present

## 2015-09-03 DIAGNOSIS — F17210 Nicotine dependence, cigarettes, uncomplicated: Secondary | ICD-10-CM | POA: Insufficient documentation

## 2015-09-03 DIAGNOSIS — F1010 Alcohol abuse, uncomplicated: Secondary | ICD-10-CM | POA: Diagnosis not present

## 2015-09-03 DIAGNOSIS — Z811 Family history of alcohol abuse and dependence: Secondary | ICD-10-CM | POA: Diagnosis not present

## 2015-09-03 DIAGNOSIS — G8929 Other chronic pain: Secondary | ICD-10-CM

## 2015-09-03 DIAGNOSIS — F4310 Post-traumatic stress disorder, unspecified: Secondary | ICD-10-CM | POA: Insufficient documentation

## 2015-09-03 DIAGNOSIS — E876 Hypokalemia: Secondary | ICD-10-CM | POA: Insufficient documentation

## 2015-09-03 DIAGNOSIS — K7689 Other specified diseases of liver: Secondary | ICD-10-CM | POA: Diagnosis not present

## 2015-09-03 DIAGNOSIS — Z820 Family history of epilepsy and other diseases of the nervous system: Secondary | ICD-10-CM | POA: Insufficient documentation

## 2015-09-03 DIAGNOSIS — K859 Acute pancreatitis, unspecified: Secondary | ICD-10-CM

## 2015-09-03 DIAGNOSIS — R1013 Epigastric pain: Secondary | ICD-10-CM | POA: Insufficient documentation

## 2015-09-03 DIAGNOSIS — Z808 Family history of malignant neoplasm of other organs or systems: Secondary | ICD-10-CM | POA: Diagnosis not present

## 2015-09-03 DIAGNOSIS — Z8711 Personal history of peptic ulcer disease: Secondary | ICD-10-CM | POA: Insufficient documentation

## 2015-09-03 DIAGNOSIS — J45909 Unspecified asthma, uncomplicated: Secondary | ICD-10-CM | POA: Diagnosis not present

## 2015-09-03 HISTORY — DX: Gastric ulcer, unspecified as acute or chronic, without hemorrhage or perforation: K25.9

## 2015-09-03 HISTORY — DX: Helicobacter pylori (H. pylori) as the cause of diseases classified elsewhere: K27.9

## 2015-09-03 HISTORY — DX: Helicobacter pylori (H. pylori) as the cause of diseases classified elsewhere: B96.81

## 2015-09-03 LAB — URINALYSIS COMPLETE WITH MICROSCOPIC (ARMC ONLY)
BILIRUBIN URINE: NEGATIVE
BILIRUBIN URINE: NEGATIVE
Bacteria, UA: NONE SEEN
Bacteria, UA: NONE SEEN
GLUCOSE, UA: NEGATIVE mg/dL
Glucose, UA: NEGATIVE mg/dL
KETONES UR: NEGATIVE mg/dL
LEUKOCYTES UA: NEGATIVE
Leukocytes, UA: NEGATIVE
NITRITE: NEGATIVE
NITRITE: NEGATIVE
PH: 8 (ref 5.0–8.0)
Protein, ur: 100 mg/dL — AB
Protein, ur: 100 mg/dL — AB
SPECIFIC GRAVITY, URINE: 1.024 (ref 1.005–1.030)
SPECIFIC GRAVITY, URINE: 1.027 (ref 1.005–1.030)
pH: 6 (ref 5.0–8.0)

## 2015-09-03 LAB — CBC WITH DIFFERENTIAL/PLATELET
BASOS PCT: 0 %
Basophils Absolute: 0 10*3/uL (ref 0–0.1)
Basophils Absolute: 0.1 10*3/uL (ref 0–0.1)
Basophils Relative: 0 %
EOS ABS: 0 10*3/uL (ref 0–0.7)
EOS PCT: 0 %
Eosinophils Absolute: 0 10*3/uL (ref 0–0.7)
Eosinophils Relative: 0 %
HCT: 39.4 % (ref 35.0–47.0)
HEMATOCRIT: 37.1 % (ref 35.0–47.0)
HEMOGLOBIN: 12.7 g/dL (ref 12.0–16.0)
Hemoglobin: 12.2 g/dL (ref 12.0–16.0)
LYMPHS ABS: 1.2 10*3/uL (ref 1.0–3.6)
Lymphocytes Relative: 15 %
Lymphocytes Relative: 10 %
Lymphs Abs: 1.3 10*3/uL (ref 1.0–3.6)
MCH: 28.8 pg (ref 26.0–34.0)
MCH: 29 pg (ref 26.0–34.0)
MCHC: 32.2 g/dL (ref 32.0–36.0)
MCHC: 32.9 g/dL (ref 32.0–36.0)
MCV: 89.3 fL (ref 80.0–100.0)
MCV: 88.3 fL (ref 80.0–100.0)
MONO ABS: 0.8 10*3/uL (ref 0.2–0.9)
MONOS PCT: 3 %
MONOS PCT: 6 %
Monocytes Absolute: 0.2 10*3/uL (ref 0.2–0.9)
NEUTROS ABS: 10.1 10*3/uL — AB (ref 1.4–6.5)
Neutro Abs: 6.9 10*3/uL — ABNORMAL HIGH (ref 1.4–6.5)
Neutrophils Relative %: 82 %
Neutrophils Relative %: 84 %
PLATELETS: 291 10*3/uL (ref 150–440)
Platelets: 288 10*3/uL (ref 150–440)
RBC: 4.41 MIL/uL (ref 3.80–5.20)
RBC: 4.2 MIL/uL (ref 3.80–5.20)
RDW: 15 % — ABNORMAL HIGH (ref 11.5–14.5)
RDW: 14.9 % — AB (ref 11.5–14.5)
WBC: 8.4 10*3/uL (ref 3.6–11.0)
WBC: 12.2 10*3/uL — ABNORMAL HIGH (ref 3.6–11.0)

## 2015-09-03 LAB — COMPREHENSIVE METABOLIC PANEL
ALBUMIN: 4.6 g/dL (ref 3.5–5.0)
ALK PHOS: 72 U/L (ref 38–126)
ALT: 16 U/L (ref 14–54)
ALT: 14 U/L (ref 14–54)
ANION GAP: 6 (ref 5–15)
ANION GAP: 12 (ref 5–15)
AST: 16 U/L (ref 15–41)
AST: 17 U/L (ref 15–41)
Albumin: 4.8 g/dL (ref 3.5–5.0)
Alkaline Phosphatase: 72 U/L (ref 38–126)
BILIRUBIN TOTAL: 0.3 mg/dL (ref 0.3–1.2)
BUN: 8 mg/dL (ref 6–20)
BUN: 8 mg/dL (ref 6–20)
CALCIUM: 9.2 mg/dL (ref 8.9–10.3)
CO2: 27 mmol/L (ref 22–32)
CO2: 26 mmol/L (ref 22–32)
Calcium: 9.6 mg/dL (ref 8.9–10.3)
Chloride: 107 mmol/L (ref 101–111)
Chloride: 103 mmol/L (ref 101–111)
Creatinine, Ser: 0.58 mg/dL (ref 0.44–1.00)
Creatinine, Ser: 0.64 mg/dL (ref 0.44–1.00)
GFR calc Af Amer: >60 mL/min (ref >60)
GLUCOSE: 115 mg/dL — AB (ref 65–99)
Glucose, Bld: 142 mg/dL — ABNORMAL HIGH (ref 65–99)
POTASSIUM: 3.8 mmol/L (ref 3.5–5.1)
Potassium: 3.5 mmol/L (ref 3.5–5.1)
SODIUM: 140 mmol/L (ref 135–145)
Sodium: 141 mmol/L (ref 135–145)
TOTAL PROTEIN: 8.3 g/dL — AB (ref 6.5–8.1)
TOTAL PROTEIN: 8.3 g/dL — AB (ref 6.5–8.1)
Total Bilirubin: 0.5 mg/dL (ref 0.3–1.2)

## 2015-09-03 LAB — POCT PREGNANCY, URINE: PREG TEST UR: NEGATIVE

## 2015-09-03 LAB — LIPASE, BLOOD
LIPASE: 30 U/L (ref 11–51)
LIPASE: 93 U/L — AB (ref 11–51)

## 2015-09-03 MED ORDER — OXYCODONE-ACETAMINOPHEN 5-325 MG PO TABS
2.0000 | ORAL_TABLET | Freq: Once | ORAL | Status: AC
  Administered 2015-09-03: 2 via ORAL
  Filled 2015-09-03: qty 2

## 2015-09-03 MED ORDER — OXYCODONE HCL 5 MG PO TABS
5.0000 mg | ORAL_TABLET | ORAL | Status: DC | PRN
  Administered 2015-09-04 (×2): 5 mg via ORAL
  Filled 2015-09-03 (×2): qty 1

## 2015-09-03 MED ORDER — SUCRALFATE 1 G PO TABS
1.0000 g | ORAL_TABLET | Freq: Once | ORAL | Status: AC
  Administered 2015-09-03: 1 g via ORAL
  Filled 2015-09-03: qty 1

## 2015-09-03 MED ORDER — ONDANSETRON HCL 4 MG/2ML IJ SOLN
4.0000 mg | Freq: Once | INTRAMUSCULAR | Status: AC
  Administered 2015-09-03: 4 mg via INTRAVENOUS
  Filled 2015-09-03: qty 2

## 2015-09-03 MED ORDER — GI COCKTAIL ~~LOC~~
30.0000 mL | Freq: Once | ORAL | Status: AC
  Administered 2015-09-03: 30 mL via ORAL
  Filled 2015-09-03: qty 30

## 2015-09-03 MED ORDER — ALPRAZOLAM 1 MG PO TABS: 2.0000 mg | ORAL_TABLET | Freq: Two times a day (BID) | ORAL | Status: DC | PRN

## 2015-09-03 MED ORDER — PANTOPRAZOLE SODIUM 40 MG PO TBEC
40.0000 mg | DELAYED_RELEASE_TABLET | Freq: Once | ORAL | Status: AC
  Administered 2015-09-03: 40 mg via ORAL
  Filled 2015-09-03: qty 1

## 2015-09-03 MED ORDER — OMEPRAZOLE 20 MG PO CPDR: 20.0000 mg | DELAYED_RELEASE_CAPSULE | Freq: Two times a day (BID) | ORAL | Status: DC

## 2015-09-03 MED ORDER — ONDANSETRON 4 MG PO TBDP: 4.0000 mg | ORAL_TABLET | Freq: Three times a day (TID) | ORAL | Status: DC | PRN

## 2015-09-03 MED ORDER — PROMETHAZINE HCL 25 MG/ML IJ SOLN
12.5000 mg | Freq: Once | INTRAMUSCULAR | Status: AC
  Administered 2015-09-03: 12.5 mg via INTRAVENOUS
  Filled 2015-09-03: qty 1

## 2015-09-03 MED ORDER — ACETAMINOPHEN 325 MG PO TABS: 650.0000 mg | ORAL_TABLET | Freq: Four times a day (QID) | ORAL | Status: DC | PRN

## 2015-09-03 MED ORDER — HEPARIN SODIUM (PORCINE) 5000 UNIT/ML IJ SOLN
5000.0000 [IU] | Freq: Three times a day (TID) | INTRAMUSCULAR | Status: DC
  Administered 2015-09-04 – 2015-09-05 (×3): 5000 [IU] via SUBCUTANEOUS
  Filled 2015-09-03 (×4): qty 1

## 2015-09-03 MED ORDER — ONDANSETRON HCL 4 MG PO TABS: 4.0000 mg | ORAL_TABLET | ORAL | Status: DC | PRN

## 2015-09-03 MED ORDER — SODIUM CHLORIDE 0.9 % IV BOLUS (SEPSIS)
1000.0000 mL | Freq: Once | INTRAVENOUS | Status: AC
  Administered 2015-09-03: 1000 mL via INTRAVENOUS

## 2015-09-03 MED ORDER — QUETIAPINE FUMARATE 25 MG PO TABS
100.0000 mg | ORAL_TABLET | Freq: Every day | ORAL | Status: DC
  Administered 2015-09-04 (×2): 100 mg via ORAL
  Filled 2015-09-03 (×2): qty 4

## 2015-09-03 MED ORDER — MORPHINE SULFATE (PF) 4 MG/ML IV SOLN
4.0000 mg | Freq: Once | INTRAVENOUS | Status: AC
  Administered 2015-09-03: 4 mg via INTRAVENOUS
  Filled 2015-09-03: qty 1

## 2015-09-03 MED ORDER — ONDANSETRON 4 MG PO TBDP
4.0000 mg | ORAL_TABLET | Freq: Once | ORAL | Status: AC
  Administered 2015-09-03: 4 mg via ORAL

## 2015-09-03 MED ORDER — CLARITHROMYCIN 500 MG PO TABS
500.0000 mg | ORAL_TABLET | Freq: Two times a day (BID) | ORAL | Status: DC
  Administered 2015-09-04 (×3): 500 mg via ORAL
  Filled 2015-09-03 (×3): qty 1

## 2015-09-03 MED ORDER — HYDROMORPHONE HCL 1 MG/ML IJ SOLN
1.0000 mg | INTRAMUSCULAR | Status: DC | PRN
  Administered 2015-09-03 – 2015-09-05 (×7): 1 mg via INTRAVENOUS
  Filled 2015-09-03 (×7): qty 1

## 2015-09-03 MED ORDER — ONDANSETRON HCL 4 MG/2ML IJ SOLN
4.0000 mg | INTRAMUSCULAR | Status: DC | PRN
  Administered 2015-09-03 – 2015-09-04 (×2): 4 mg via INTRAVENOUS
  Filled 2015-09-03 (×3): qty 2

## 2015-09-03 MED ORDER — CITALOPRAM HYDROBROMIDE 20 MG PO TABS
40.0000 mg | ORAL_TABLET | Freq: Every day | ORAL | Status: DC
  Administered 2015-09-04 (×2): 40 mg via ORAL
  Filled 2015-09-03 (×2): qty 2

## 2015-09-03 MED ORDER — ONDANSETRON 4 MG PO TBDP
ORAL_TABLET | ORAL | Status: AC
  Administered 2015-09-03: 4 mg via ORAL
  Filled 2015-09-03: qty 1

## 2015-09-03 MED ORDER — SODIUM CHLORIDE 0.9 % IV BOLUS (SEPSIS)
500.0000 mL | Freq: Once | INTRAVENOUS | Status: AC
  Administered 2015-09-03: 500 mL via INTRAVENOUS

## 2015-09-03 MED ORDER — ACETAMINOPHEN 650 MG RE SUPP: 650.0000 mg | Freq: Four times a day (QID) | RECTAL | Status: DC | PRN

## 2015-09-03 MED ORDER — PANTOPRAZOLE SODIUM 40 MG PO TBEC
40.0000 mg | DELAYED_RELEASE_TABLET | Freq: Two times a day (BID) | ORAL | Status: DC
  Administered 2015-09-04 (×2): 40 mg via ORAL
  Filled 2015-09-03 (×2): qty 1

## 2015-09-03 MED ORDER — HYDRALAZINE HCL 20 MG/ML IJ SOLN: 10.0000 mg | INTRAMUSCULAR | Status: DC | PRN

## 2015-09-03 MED ORDER — ZOLPIDEM TARTRATE 5 MG PO TABS
10.0000 mg | ORAL_TABLET | Freq: Every day | ORAL | Status: DC
  Administered 2015-09-04 (×2): 10 mg via ORAL
  Filled 2015-09-03 (×2): qty 2

## 2015-09-03 MED ORDER — LORAZEPAM 2 MG/ML IJ SOLN
2.0000 mg | Freq: Once | INTRAMUSCULAR | Status: DC
  Filled 2015-09-03: qty 1

## 2015-09-03 MED ORDER — SODIUM CHLORIDE 0.9 % IV SOLN
INTRAVENOUS | Status: DC
  Administered 2015-09-03 – 2015-09-05 (×4): via INTRAVENOUS

## 2015-09-03 MED ORDER — PANTOPRAZOLE SODIUM 40 MG IV SOLR
40.0000 mg | Freq: Once | INTRAVENOUS | Status: AC
  Administered 2015-09-03: 40 mg via INTRAVENOUS
  Filled 2015-09-03: qty 40

## 2015-09-03 MED ORDER — SUCRALFATE 1 G PO TABS
1.0000 g | ORAL_TABLET | Freq: Three times a day (TID) | ORAL | Status: DC
  Administered 2015-09-04 (×4): 1 g via ORAL
  Filled 2015-09-03 (×4): qty 1

## 2015-09-03 NOTE — ED Notes (Signed)
Patient to room 6 without protocols being done due to active vomiting.

## 2015-09-03 NOTE — ED Notes (Signed)
Patient has epigastric pain that began today. Has history of ulcers and h pylori. Nausea and vomiting.

## 2015-09-03 NOTE — ED Notes (Signed)
Pt made aware that d/c is impending in 15-30 mins following narcotic administration.

## 2015-09-03 NOTE — ED Notes (Signed)
Pt instructed to follow up with Dr. Bluford Kaufmannh.  Explained to patient that we have done all the tests we can and given her all the medications we can and that it is up to her to follow up with providers as an outpatient.

## 2015-09-03 NOTE — ED Provider Notes (Signed)
[facility] Emergency Department Provider Note  ____________________________________________  Time seen: Approximately 6:12 PM  I have reviewed the triage vital signs and the nursing notes.   HISTORY  Chief Complaint No chief complaint on file.    HPI Laurie Frederick is a 39 y.o. female with history of depression, hyperlipidemia, multiple admissions for asthma epigastric pain nausea and vomiting related to H. pylori gastritis who presents for evaluation of recurrent epigastric abdominal pain, nausea and nonbloody nonbilious emesis which began yesterday evening, constant since onset and worsening. The patient was seen in this ER last night for similar symptoms and was discharged with pain control. She reports that she has had continued vomiting. She was seen by GI this afternoon, abdominal MRI was ordered for next week and she was told to the present to the emergency department for continued pain and vomiting. No diarrhea or fevers. No pain or burning with urination. Currently she is menstruating. She reports that pain medications and Phenergan typically help her symptoms however she has not been able to keep them down secondary to vomiting. No chest pain or difficulty breathing.   Past Medical History  Diagnosis Date  . Sleep apnea   . GERD (gastroesophageal reflux disease)   . Hyperlipidemia   . Depression   . Anxiety   . PTSD (post-traumatic stress disorder)   . Obesity   . Anemia   . Vitamin D deficiency   . Nontoxic uninodular goiter   . Multiple gastric ulcers   . H pylori ulcer     Patient Active Problem List   Diagnosis Date Noted  . Hypokalemia 08/16/2015  . Chronic prescription benzodiazepine use 08/16/2015  . Intractable nausea and vomiting 07/22/2015  . Epigastric pain   . Helicobacter positive gastritis 07/16/2015  . Gastritis and gastroduodenitis 07/16/2015  . Hyperlipidemia 07/13/2015  . Vitamin D deficiency 07/13/2015  . Thyroid  nodule 07/13/2015  . Polyp of gallbladder 07/13/2015  . Liver nodule 07/13/2015  . Nausea & vomiting 07/13/2015  . Gastroenteritis 07/06/2015  . 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 Alcohol abuse 04/01/2015    Past Surgical History  Procedure Laterality Date  . Cesarean section    . Esophagogastroduodenoscopy (egd) with propofol Left 07/07/2015    Procedure: ESOPHAGOGASTRODUODENOSCOPY (EGD) WITH PROPOFOL;  Surgeon: Scot Jun, MD;  Location: [facility] ENDOSCOPY;  Service: Endoscopy;  Laterality: Left;  . Cesarean section      Current Outpatient Rx  Name  Route  Sig  Dispense  Refill  . alprazolam (XANAX) 2 MG tablet   Oral   Take 2 mg by mouth 2 (two) times daily as needed for anxiety.          . citalopram (CELEXA) 40 MG tablet   Oral   Take 40 mg by mouth at bedtime.         . clarithromycin (BIAXIN) 500 MG tablet   Oral   Take 500 mg by mouth 2 (two) times daily.         Marland Kitchen omeprazole (PRILOSEC) 20 MG capsule   Oral   Take 1 capsule (20 mg total) by mouth 2 (two) times daily.   60 capsule   1   . ondansetron (ZOFRAN ODT) 4 MG disintegrating tablet   Oral   Take 1 tablet (4 mg total) by mouth every 8 (eight) hours as needed for nausea or vomiting.   20 tablet   0   . pantoprazole (PROTONIX) 40 MG tablet   Oral  Take 1 tablet (40 mg total) by mouth 2 (two) times daily before a meal.   60 tablet   0   . promethazine (PHENERGAN) 25 MG suppository   Rectal   Place 25 mg rectally every 6 (six) hours as needed for nausea or vomiting.         Marland Kitchen QUEtiapine (SEROQUEL) 50 MG tablet   Oral   Take 100 mg by mouth at bedtime.         . sucralfate (CARAFATE) 1 G tablet   Oral   Take 1 g by mouth 4 (four) times daily -  with meals and at bedtime.         Marland Kitchen zolpidem (AMBIEN) 10 MG tablet   Oral   Take 10 mg by mouth at bedtime.         . nicotine (NICODERM CQ - DOSED IN MG/24 HR) 7 mg/24hr patch   Transdermal   Place 1 patch (7 mg total) onto  the skin daily. Patient not taking: Reported on 08/16/2015   28 patch   0   . promethazine (PHENERGAN) 25 MG tablet   Oral   Take 1 tablet (25 mg total) by mouth every 4 (four) hours as needed for nausea or vomiting. Patient not taking: Reported on 09/03/2015   12 tablet   0     Allergies Review of patient's allergies indicates no known allergies.  Family History  Problem Relation Age of Onset  . Lung cancer Father   . Cancer Father     lung cancer  . Alcohol abuse Father   . Lung cancer Paternal Aunt   . Cancer Paternal Aunt     lung  . Bone cancer Paternal Uncle   . Cancer Paternal Uncle     pancreatic  . Depression Mother   . Anxiety disorder Mother   . Arthritis Mother   . Hyperlipidemia Mother   . Hypertension Maternal Grandmother   . Diabetes Paternal Grandmother   . Heart disease Paternal Grandmother   . Hypertension Paternal Grandmother   . Asthma Son   . Vision loss Neg Hx   . COPD Neg Hx     Social History Social History  Substance Use Topics  . Smoking status: Current Some Day Smoker -- 0.50 packs/day for 17 years    Types: Cigarettes  . Smokeless tobacco: Never Used  . Alcohol Use: Yes     Comment: occasional    Review of Systems Constitutional: No fever/chills Eyes: No visual changes. ENT: No sore throat. Cardiovascular: Denies chest pain. Respiratory: Denies shortness of breath. Gastrointestinal: + abdominal pain.  + nausea, + vomiting.  No diarrhea.  No constipation. Genitourinary: Negative for dysuria. Musculoskeletal: Negative for back pain. Skin: Negative for rash. Neurological: Negative for headaches, focal weakness or numbness.  10-point ROS otherwise negative.  ____________________________________________   PHYSICAL EXAM:  VITAL SIGNS: ED Triage Vitals  Enc Vitals Group     BP 09/03/15 1540 153/96 mmHg     Pulse Rate 09/03/15 1540 74     Resp 09/03/15 1540 16     Temp 09/03/15 1540 98.4 F (36.9 C)     Temp Source  09/03/15 1540 Oral     SpO2 09/03/15 1540 98 %     Weight 09/03/15 1540 181 lb (82.101 kg)     Height 09/03/15 1540 5\' 3"  (1.6 m)     Head Cir --      Peak Flow --      Pain  Score 09/03/15 1542 10     Pain Loc --      Pain Edu? --      Excl. in GC? --     Constitutional: Alert and oriented. Tearful, in mild distress due to pain. Eyes: Conjunctivae are normal. PERRL. EOMI. Head: Atraumatic. Nose: No congestion/rhinnorhea. Mouth/Throat: Mucous membranes are moist.  Oropharynx non-erythematous. Neck: No stridor.  Cardiovascular: Normal rate, regular rhythm. Grossly normal heart sounds.  Good peripheral circulation. Respiratory: Normal respiratory effort.  No retractions. Lungs CTAB. Gastrointestinal: Soft with moderate epigastric tenderness. No CVA tenderness. Genitourinary: deferred Musculoskeletal: No lower extremity tenderness nor edema.  No joint effusions. Neurologic:  Normal speech and language. No gross focal neurologic deficits are appreciated. No gait instability. Skin:  Skin is warm, dry and intact. No rash noted. Psychiatric: Mood and affect are normal. Speech and behavior are normal.  ____________________________________________   LABS (all labs ordered are listed, but only abnormal results are displayed)  Labs Reviewed  CBC WITH DIFFERENTIAL/PLATELET - Abnormal; Notable for the following:    WBC 12.2 (*)    RDW 14.9 (*)    Neutro Abs 10.1 (*)    All other components within normal limits  COMPREHENSIVE METABOLIC PANEL - Abnormal; Notable for the following:    Glucose, Bld 115 (*)    Total Protein 8.3 (*)    All other components within normal limits  LIPASE, BLOOD - Abnormal; Notable for the following:    Lipase 93 (*)    All other components within normal limits  URINALYSIS COMPLETEWITH MICROSCOPIC ([facility] ONLY) - Abnormal; Notable for the following:    Color, Urine YELLOW (*)    APPearance HAZY (*)    Ketones, ur 2+ (*)    Hgb urine dipstick 3+ (*)     Protein, ur 100 (*)    Squamous Epithelial / LPF 0-5 (*)    All other components within normal limits  POC URINE PREG, ED  POCT PREGNANCY, URINE   ____________________________________________  EKG  none ____________________________________________  RADIOLOGY  none ____________________________________________   PROCEDURES  Procedure(s) performed: None  Critical Care performed: No  ____________________________________________   INITIAL IMPRESSION / ASSESSMENT AND PLAN / ED COURSE  Pertinent labs & imaging results that were available during my care of the patient were reviewed by me and considered in my medical decision making (see chart for details).  Dlynn Ranes is a 39 y.o. female with history of depression, hyperlipidemia, multiple admissions for asthma epigastric pain nausea and vomiting related to H. pylori gastritis who presents for evaluation of recurrent epigastric abdominal pain, nausea and nonbloody nonbilious emesis which began yesterday evening, constant since onset and worsening. On exam, she is tearful and in mild distress secondary to pain. Mild tachypnea, arrival and hypertensive which I suspect are also secondary to binge. She does have moderate tenderness in the epigastrium. Suspect her symptoms may be segmented to recurrent gastritis which we will treat symptomatically with fluids, pain control, antiemetics. Screen labs pending. Reassess for disposition.  ----------------------------------------- 8:15 PM on 09/03/2015 ----------------------------------------- Labs reviewed. Labs are notable for leukocytosis. Normal CMP. Lipase is elevated at 93 which could respire present pancreatitis or could be reactive in the setting of gastritis, vomiting. Urinalysis shows multiple ketones, numerous red blood cells which are to be expected given the fact that she is currently menstruating. Despite 2 doses of morphine she is having persistent pain, discussed the case  with the hospitalist at this time for admission.  ____________________________________________   FINAL CLINICAL IMPRESSION(S) /  ED DIAGNOSES  Final diagnoses:  Abdominal pain, epigastric  Gastritis  Acute pancreatitis, unspecified pancreatitis type      Gayla DossEryka A Elynore Dolinski, MD 09/03/15 2016

## 2015-09-03 NOTE — Discharge Instructions (Signed)

## 2015-09-03 NOTE — ED Provider Notes (Signed)
[facility] Emergency Department Provider Note  ____________________________________________  Time seen: 4:00 AM  I have reviewed the triage vital signs and the nursing notes.   HISTORY  Chief Complaint Abdominal Pain     HPI Colleen Kotlarz is a 39 y.o. female presents with epigastric abdominal pain with onset today per patient. Patient states her current pain score is 9 out of 10. Patient also admits to nausea and vomiting that is nonbloody. Patient states that this has been going on for a "long time" and that pain is similar to previous pain in the past. Review of the patient's chart revealed multiple visits to [facility] gastroenterologist for the same for which she was diagnosed with history of ulcers and H. pylori     Past Medical History  Diagnosis Date  . Sleep apnea   . GERD (gastroesophageal reflux disease)   . Hyperlipidemia   . Depression   . Anxiety   . PTSD (post-traumatic stress disorder)   . Obesity   . Anemia   . Vitamin D deficiency   . Nontoxic uninodular goiter     Patient Active Problem List   Diagnosis Date Noted  . Hypokalemia 08/16/2015  . Chronic prescription benzodiazepine use 08/16/2015  . Intractable nausea and vomiting 07/22/2015  . Epigastric pain   . Helicobacter positive gastritis 07/16/2015  . Gastritis and gastroduodenitis 07/16/2015  . Hyperlipidemia 07/13/2015  . Vitamin D deficiency 07/13/2015  . Thyroid nodule 07/13/2015  . Polyp of gallbladder 07/13/2015  . Liver nodule 07/13/2015  . Nausea & vomiting 07/13/2015  . Gastroenteritis 07/06/2015  . 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 Alcohol abuse 04/01/2015    Past Surgical History  Procedure Laterality Date  . Cesarean section    . Esophagogastroduodenoscopy (egd) with propofol Left 07/07/2015    Procedure: ESOPHAGOGASTRODUODENOSCOPY (EGD) WITH PROPOFOL;  Surgeon: Scot Jun, MD;  Location: [facility] ENDOSCOPY;  Service: Endoscopy;   Laterality: Left;  . Cesarean section      Current Outpatient Rx  Name  Route  Sig  Dispense  Refill  . alprazolam (XANAX) 2 MG tablet   Oral   Take 2 mg by mouth 2 (two) times daily.         . citalopram (CELEXA) 20 MG tablet   Oral   Take 1 tablet (20 mg total) by mouth at bedtime. Patient taking differently: Take 40 mg by mouth at bedtime.    30 tablet   0   . clarithromycin (BIAXIN) 500 MG tablet   Oral   Take 1 tablet (500 mg total) by mouth 2 (two) times daily.   26 tablet   0   . nicotine (NICODERM CQ - DOSED IN MG/24 HR) 7 mg/24hr patch   Transdermal   Place 1 patch (7 mg total) onto the skin daily. Patient not taking: Reported on 08/16/2015   28 patch   0   . pantoprazole (PROTONIX) 40 MG tablet   Oral   Take 1 tablet (40 mg total) by mouth 2 (two) times daily before a meal.   60 tablet   0   . promethazine (PHENERGAN) 25 MG tablet   Oral   Take 1 tablet (25 mg total) by mouth every 4 (four) hours as needed for nausea or vomiting.   12 tablet   0   . QUEtiapine (SEROQUEL) 50 MG tablet   Oral   Take 1 tablet (50 mg total) by mouth at bedtime. Patient taking differently: Take 150 mg  by mouth at bedtime.    30 tablet   0   . sucralfate (CARAFATE) 1 G tablet   Oral   Take 1 tablet (1 g total) by mouth 4 (four) times daily -  with meals and at bedtime. Dissolve in water and take it.   120 tablet   0   . zolpidem (AMBIEN) 10 MG tablet   Oral   Take 10 mg by mouth at bedtime.           Allergies No known drug allergies  Family History  Problem Relation Age of Onset  . Lung cancer Father   . Cancer Father     lung cancer  . Alcohol abuse Father   . Lung cancer Paternal Aunt   . Cancer Paternal Aunt     lung  . Bone cancer Paternal Uncle   . Cancer Paternal Uncle     pancreatic  . Depression Mother   . Anxiety disorder Mother   . Arthritis Mother   . Hyperlipidemia Mother   . Hypertension Maternal Grandmother   . Diabetes Paternal  Grandmother   . Heart disease Paternal Grandmother   . Hypertension Paternal Grandmother   . Asthma Son   . Vision loss Neg Hx   . COPD Neg Hx     Social History Social History  Substance Use Topics  . Smoking status: Current Some Day Smoker -- 0.50 packs/day for 17 years    Types: Cigarettes  . Smokeless tobacco: Never Used  . Alcohol Use: Yes     Comment: occasional    Review of Systems  Constitutional: Negative for fever. Eyes: Negative for visual changes. ENT: Negative for sore throat. Cardiovascular: Negative for chest pain. Respiratory: Negative for shortness of breath. Gastrointestinal: positive for abdominal pain and vomiting Genitourinary: Negative for dysuria. Musculoskeletal: Negative for back pain. Skin: Negative for rash. Neurological: Negative for headaches, focal weakness or numbness.   10-point ROS otherwise negative.  ____________________________________________   PHYSICAL EXAM:  VITAL SIGNS: ED Triage Vitals  Enc Vitals Group     BP 09/03/15 0128 160/99 mmHg     Pulse Rate 09/03/15 0128 84     Resp 09/03/15 0128 18     Temp 09/03/15 0128 98.2 F (36.8 C)     Temp Source 09/03/15 0128 Oral     SpO2 09/03/15 0128 99 %     Weight 09/03/15 0128 181 lb (82.101 kg)     Height 09/03/15 0128  (1.6 m)     Head Cir --      Peak Flow --      Pain Score 09/03/15 0150 10     Pain Loc --      Pain Edu? --      Excl. in GC? --      Constitutional: Alert and oriented. Well appearing and in no distress. Eyes: Conjunctivae are normal. PERRL. Normal extraocular movements. ENT   Head: Normocephalic and atraumatic.   Nose: No congestion/rhinnorhea.   Mouth/Throat: Mucous membranes are moist.   Neck: No stridor. Hematological/Lymphatic/Immunilogical: No cervical lymphadenopathy. Cardiovascular: Normal rate, regular rhythm. Normal and symmetric distal pulses are present in all extremities. No murmurs, rubs, or gallops. Respiratory:  Normal respiratory effort without tachypnea nor retractions. Breath sounds are clear and equal bilaterally. No wheezes/rales/rhonchi. Gastrointestinal: epigastric tenderness to palpation. No distention. There is no CVA tenderness. Genitourinary: deferred Musculoskeletal: Nontender with normal range of motion in all extremities. No joint effusions.  No lower extremity tenderness nor edema.  Neurologic:  Normal speech and language. No gross focal neurologic deficits are appreciated. Speech is normal.  Skin:  Skin is warm, dry and intact. No rash noted. Psychiatric: Mood and affect are normal. Speech and behavior are normal. Patient exhibits appropriate insight and judgment.  ____________________________________________    LABS (pertinent positives/negatives)  Labs Reviewed  CBC WITH DIFFERENTIAL/PLATELET - Abnormal; Notable for the following:    RDW 15.0 (*)    Neutro Abs 6.9 (*)    All other components within normal limits  COMPREHENSIVE METABOLIC PANEL - Abnormal; Notable for the following:    Glucose, Bld 142 (*)    Total Protein 8.3 (*)    All other components within normal limits  URINALYSIS COMPLETEWITH MICROSCOPIC ([facility] ONLY) - Abnormal; Notable for the following:    Color, Urine YELLOW (*)    APPearance HAZY (*)    Hgb urine dipstick 2+ (*)    Protein, ur 100 (*)    Squamous Epithelial / LPF 0-5 (*)    All other components within normal limits  LIPASE, BLOOD       RADIOLOGY    US Abdomen Limited RUQ (Final result) Result time: 09/03/15 04:14:54   Procedure changed from US Abdomen Limited      Final result by Rad Results In Interface (09/03/15 04:14:54)   Narrative:   CLINICAL DATA: Epigastric pain, nausea and vomiting  EXAM: US ABDOMEN LIMITED - RIGHT UPPER QUADRANT  COMPARISON: 07/22/2015  FINDINGS: Gallbladder:  Non mobile non shadowing echogenic focus of the posterior gallbladder wall, likely a small polyp measuring 7 x 13 mm. No conclusive  calculi. No gallbladder wall thickening or pericholecystic fluid. No tenderness over the gallbladder  Common bile duct:  Diameter: 3.0 mm  Liver:  Indeterminate 2.4 cm right lobe hypoechoic lesion, unchanged from 07/22/2015. MRI pre and post-contrast recommended for characterization.  IMPRESSION: 1. Probable gallbladder polyp. No conclusive calculi. 2. Indeterminate right lobe liver lesion. Recommend MRI pre and postcontrast for characterization.   Electronically Signed By: Ellery Plunkaniel R Mitchell M.D. On: 09/03/2015 04:14            INITIAL IMPRESSION / ASSESSMENT AND PLAN / ED COURSE  Pertinent labs & imaging results that were available during my care of the patient were reviewed by me and considered in my medical decision making (see chart for details).  History of physical exam consistent with possible gastric ulcer disease versus gallstones gallbladder ultrasoundrevealed gallbladder polyp. Patient will be referred to her gastroenterologist for further evaluation on the outpatient setting.  ____________________________________________   FINAL CLINICAL IMPRESSION(S) / ED DIAGNOSES  Final diagnoses:  Epigastric pain      Darci Currentandolph N Shahmeer Bunn, MD 09/03/15 [phone]

## 2015-09-03 NOTE — H&P (Signed)
[facility] - [place] at [facility]   PATIENT NAME: Laurie Frederick    MR#:  [id number]  DATE OF BIRTH:  1975/12/24   DATE OF ADMISSION:  09/03/2015  PRIMARY CARE PHYSICIAN: Baruch Gouty, MD   REQUESTING/REFERRING PHYSICIAN: Inocencio Homes  CHIEF COMPLAINT:  No chief complaint on file.  nausea/vomiting  HISTORY OF PRESENT ILLNESS:  Laurie Frederick  is a 39 y.o. female with a known history of H. pylori, peptic ulcer disease who is presenting with nausea and vomiting. She states she that she been doing remarkably well for the past 15 days however now has one to 2 day duration of nausea and vomiting she stated that her symptoms started after eating a sausage sandwich prepared by her mother. She describes having abdominal pain located in epigastric region without radiation quality of dull/gnawing, intensity 7/10 no worsening or relieving factors. As far as the nausea and vomiting she is a persistent nausea which waxes and wanes with associated vomiting nonbloody nonbilious emesis. Denies further symptomatology at this time.  PAST MEDICAL HISTORY:   Past Medical History  Diagnosis Date  . Sleep apnea   . GERD (gastroesophageal reflux disease)   . Hyperlipidemia   . Depression   . Anxiety   . PTSD (post-traumatic stress disorder)   . Obesity   . Anemia   . Vitamin D deficiency   . Nontoxic uninodular goiter   . Multiple gastric ulcers   . H pylori ulcer     PAST SURGICAL HISTORY:   Past Surgical History  Procedure Laterality Date  . Cesarean section    . Esophagogastroduodenoscopy (egd) with propofol Left 07/07/2015    Procedure: ESOPHAGOGASTRODUODENOSCOPY (EGD) WITH PROPOFOL;  Surgeon: Scot Jun, MD;  Location: [facility] ENDOSCOPY;  Service: Endoscopy;  Laterality: Left;  . Cesarean section      SOCIAL HISTORY:   Social History  Substance Use Topics  . Smoking status: Current Some Day Smoker -- 0.50 packs/day for 17 years    Types: Cigarettes  .  Smokeless tobacco: Never Used  . Alcohol Use: Yes     Comment: occasional    FAMILY HISTORY:   Family History  Problem Relation Age of Onset  . Lung cancer Father   . Cancer Father     lung cancer  . Alcohol abuse Father   . Lung cancer Paternal Aunt   . Cancer Paternal Aunt     lung  . Bone cancer Paternal Uncle   . Cancer Paternal Uncle     pancreatic  . Depression Mother   . Anxiety disorder Mother   . Arthritis Mother   . Hyperlipidemia Mother   . Hypertension Maternal Grandmother   . Diabetes Paternal Grandmother   . Heart disease Paternal Grandmother   . Hypertension Paternal Grandmother   . Asthma Son   . Vision loss Neg Hx   . COPD Neg Hx     DRUG ALLERGIES:  No Known Allergies  REVIEW OF SYSTEMS:  REVIEW OF SYSTEMS:  CONSTITUTIONAL: Denies fevers, chills, fatigue, weakness.  EYES: Denies blurred vision, double vision, or eye pain.  EARS, NOSE, THROAT: Denies tinnitus, ear pain, hearing loss.  RESPIRATORY: denies cough, shortness of breath, wheezing  CARDIOVASCULAR: Denies chest pain, palpitations, edema.  GASTROINTESTINAL: Positive nausea, vomiting, , abdominal pain.  GENITOURINARY: Denies dysuria, hematuria.  ENDOCRINE: Denies nocturia or thyroid problems. HEMATOLOGIC AND LYMPHATIC: Denies easy bruising or bleeding.  SKIN: Denies rash or lesions.  MUSCULOSKELETAL: Denies pain in neck, back, shoulder, knees,  hips, or further arthritic symptoms.  NEUROLOGIC: Denies paralysis, paresthesias.  PSYCHIATRIC: Denies anxiety or depressive symptoms. Otherwise full review of systems performed by me is negative.   MEDICATIONS AT HOME:   Prior to Admission medications   Medication Sig Start Date End Date Taking? Authorizing Provider  alprazolam Prudy Feeler(XANAX) 2 MG tablet Take 2 mg by mouth 2 (two) times daily as needed for anxiety.    Yes Historical Provider, MD  citalopram (CELEXA) 40 MG tablet Take 40 mg by mouth at bedtime.   Yes Historical Provider, MD   clarithromycin (BIAXIN) 500 MG tablet Take 500 mg by mouth 2 (two) times daily. 09/01/15 09/11/15 Yes Historical Provider, MD  omeprazole (PRILOSEC) 20 MG capsule Take 1 capsule (20 mg total) by mouth 2 (two) times daily. 09/03/15 09/02/16 Yes Darci Currentandolph N Brown, MD  ondansetron (ZOFRAN ODT) 4 MG disintegrating tablet Take 1 tablet (4 mg total) by mouth every 8 (eight) hours as needed for nausea or vomiting. 09/03/15  Yes Darci Currentandolph N Brown, MD  pantoprazole (PROTONIX) 40 MG tablet Take 1 tablet (40 mg total) by mouth 2 (two) times daily before a meal. 07/15/15  Yes Srikar Sudini, MD  promethazine (PHENERGAN) 25 MG suppository Place 25 mg rectally every 6 (six) hours as needed for nausea or vomiting.   Yes Historical Provider, MD  QUEtiapine (SEROQUEL) 50 MG tablet Take 100 mg by mouth at bedtime.   Yes Historical Provider, MD  sucralfate (CARAFATE) 1 G tablet Take 1 g by mouth 4 (four) times daily -  with meals and at bedtime.   Yes Historical Provider, MD  zolpidem (AMBIEN) 10 MG tablet Take 10 mg by mouth at bedtime.   Yes Historical Provider, MD  nicotine (NICODERM CQ - DOSED IN MG/24 HR) 7 mg/24hr patch Place 1 patch (7 mg total) onto the skin daily. Patient not taking: Reported on 08/16/2015 07/16/15   Milagros LollSrikar Sudini, MD  promethazine (PHENERGAN) 25 MG tablet Take 1 tablet (25 mg total) by mouth every 4 (four) hours as needed for nausea or vomiting. Patient not taking: Reported on 09/03/2015 07/31/15   Emily FilbertJonathan E Williams, MD      VITAL SIGNS:  Blood pressure 126/63, pulse 77, temperature 98.4 F (36.9 C), temperature source Oral, resp. rate 18, height 5\' 3"  (1.6 m), weight 181 lb (82.101 kg), last menstrual period 09/03/2015, SpO2 98 %.  PHYSICAL EXAMINATION:  VITAL SIGNS: Filed Vitals:   09/03/15 2039  BP: 126/63  Pulse: 77  Temp:   Resp: 18   GENERAL:39 y.o.female currently in no acute distress.  HEAD: Normocephalic, atraumatic.  EYES: Pupils equal, round, reactive to light. Extraocular  muscles intact. No scleral icterus.  MOUTH: Moist mucosal membrane. Dentition intact. No abscess noted.  EAR, NOSE, THROAT: Clear without exudates. No external lesions.  NECK: Supple. No thyromegaly. No nodules. No JVD.  PULMONARY: Clear to ascultation, without wheeze rails or rhonci. No use of accessory muscles, Good respiratory effort. good air entry bilaterally CHEST: Nontender to palpation.  CARDIOVASCULAR: S1 and S2. Regular rate and rhythm. No murmurs, rubs, or gallops. No edema. Pedal pulses 2+ bilaterally.  GASTROINTESTINAL: Soft,  minimal tenderness palpation epigastric region without rebound or guardingtended. No masses. Positive bowel sounds. No hepatosplenomegaly.  MUSCULOSKELETAL: No swelling, clubbing, or edema. Range of motion full in all extremities.  NEUROLOGIC: Cranial nerves II through XII are intact. No gross focal neurological deficits. Sensation intact. Reflexes intact.  SKIN: No ulceration, lesions, rashes, or cyanosis. Skin warm and dry. Turgor intact.  PSYCHIATRIC:  Mood, affect within normal limits. The patient is awake, alert and oriented x 3. Insight, judgment intact.    LABORATORY PANEL:   CBC  Recent Labs Lab 09/03/15 1841  WBC 12.2*  HGB 12.2  HCT 37.1  PLT 288   ------------------------------------------------------------------------------------------------------------------  Chemistries   Recent Labs Lab 09/03/15 1841  NA 141  K 3.8  CL 103  CO2 26  GLUCOSE 115*  BUN 8  CREATININE 0.64  CALCIUM 9.6  AST 17  ALT 14  ALKPHOS 72  BILITOT 0.3   ------------------------------------------------------------------------------------------------------------------  Cardiac Enzymes No results for input(s): TROPONINI in the last 168 hours. ------------------------------------------------------------------------------------------------------------------  RADIOLOGY:  US Abdomen Limited Ruq  09/03/2015  CLINICAL DATA:  Epigastric pain, nausea and  vomiting EXAM: US ABDOMEN LIMITED - RIGHT UPPER QUADRANT COMPARISON:  07/22/2015 FINDINGS: Gallbladder: Non mobile non shadowing echogenic focus of the posterior gallbladder wall, likely a small polyp measuring 7 x 13 mm. No conclusive calculi. No gallbladder wall thickening or pericholecystic fluid. No tenderness over the gallbladder Common bile duct: Diameter: 3.0 mm Liver: Indeterminate 2.4 cm right lobe hypoechoic lesion, unchanged from 07/22/2015. MRI pre and post-contrast recommended for characterization. IMPRESSION: 1. Probable gallbladder polyp.  No conclusive calculi. 2. Indeterminate right lobe liver lesion. Recommend MRI pre and postcontrast for characterization. Electronically Signed   By: Ellery Plunk M.D.   On: 09/03/2015 04:14    EKG:   Orders placed or performed in visit on 09/03/15  . EKG 12-Lead  . EKG 12-Lead  . EKG 12-Lead    IMPRESSION AND PLAN:   55-YEAR-OLD AFRICAN-AMERICAN FEMALE HISTORY OF PEPTIC ULCER DISEASE WAS H. PYLORI PRESENTED WITH ABDOMINAL PAIN, NAUSEA, VOMITING  1. INTRACTABLE NAUSEA, VOMITING, UNSPECIFIED: Provide supportive measures including Zofran/Phenergan as required, pain medication will start PPI therapy, consult gastroenterology. Patient counseled against tobacco as well as caffeine usage 2. Hypertension essential: This may be reactive to contact pain her blood pressure has improved, examination will add as needed hydralazine and case her is elevated if continues elevated blood pressure can start low-dose of amlodipine 3. GERD without esophagitis: Continue PPI therapy as stated above 4. Venous thrombus embolism prophylactic: Heparin subcutaneous   All the records are reviewed and case discussed with ED provider. Management plans discussed with the patient, family and they are in agreement.  CODE STATUS: FULLOTAL TIME TAKING CARE OF THIS PATIENT:35nutes.    Annalyse Langlais,  Mardi Mainland.D on 09/03/2015 at 8:51 PM  Between 7am to 6pm - Pager -  [phone]  After 6pm: House Pager: - [phone]  [facility]  Office  [phone]  CC: Primary care physician; Baruch Gouty, MD

## 2015-09-03 NOTE — ED Notes (Signed)
Patient to ED for what she believes is her "ulcer acting up." VOmiting without blood. States her midabdomen hurts.

## 2015-09-03 NOTE — ED Notes (Signed)
Pt arrived to ED reporting epigastric pain beginning in September but worsening at 1900 last night. Pt seen in ED and discharged with Zofran and Prilosec but reports no relief from medication. Pt has vomited 8 times today and presents to triage moaning and slumped over in chair. Pt reports going to gastric MD today and being told she would have to wait a week for MRI. Pt returned to ED due to continued pain.

## 2015-09-03 NOTE — ED Notes (Signed)
Patient actively vomiting in triage. To be roomed in room 6.

## 2015-09-04 DIAGNOSIS — G43A1 Cyclical vomiting, intractable: Secondary | ICD-10-CM

## 2015-09-04 DIAGNOSIS — R1013 Epigastric pain: Secondary | ICD-10-CM | POA: Diagnosis not present

## 2015-09-04 DIAGNOSIS — K2970 Gastritis, unspecified, without bleeding: Secondary | ICD-10-CM | POA: Diagnosis not present

## 2015-09-04 MED ORDER — METOCLOPRAMIDE HCL 5 MG/ML IJ SOLN
10.0000 mg | INTRAMUSCULAR | Status: DC | PRN
  Administered 2015-09-04 – 2015-09-05 (×4): 10 mg via INTRAVENOUS
  Filled 2015-09-04 (×4): qty 2

## 2015-09-04 NOTE — Plan of Care (Signed)
Problem: Bowel/Gastric: Goal: Will not experience complications related to bowel motility Outcome: Progressing Pt was admitted this shift. Dilaudid for pain with relief, Zofran iv for nausea with relief. Pt currently on her period. Continue to monitor.

## 2015-09-04 NOTE — Progress Notes (Signed)
   09/04/15 1000  Clinical Encounter Type  Visited With Patient  Visit Type Initial;Spiritual support  Referral From Nurse  Consult/Referral To Chaplain  Spiritual Encounters  Spiritual Needs Prayer  Provided pastoral presence, support and prayer to patient.  [employer]bury Automotive GroupChaplain Cranford Blessinger-pager [phone]

## 2015-09-04 NOTE — Plan of Care (Signed)
Problem: Education: Goal: Knowledge of [place] [facility] Education information/materials will improve Outcome: Progressing Educated pt about reglan, heparin and carafate. Pt verbalized understanding.  Problem: Safety: Goal: Ability to remain free from injury will improve Outcome: Progressing Discussed with pt about moderate fall risk interventions. Bed alarm remained active. Pt uses the call bell ,oob with standby assist. Steady gait.  Problem: Health Behavior/Discharge Planning: Goal: Ability to manage health-related needs will improve Outcome: Progressing Information about advance directive given to pt.  Problem: Pain Managment: Goal: General experience of comfort will improve Outcome: Progressing Epigastric pain managed with pain meds. EGD tomorrow.  Problem: Nutrition: Goal: Adequate nutrition will be maintained Outcome: Progressing Zofran given in am for nausea, pt asleep on reassessment. Reglan ordered per pt request and given once for nausea with good effect. No emesis. Tolerates diet.  Problem: Bowel/Gastric: Goal: Will not experience complications related to bowel motility Outcome: Progressing Last BM 2xdays ago. Pt verbalized that she did not eat much yesterday and does not feel constipated at this time.

## 2015-09-04 NOTE — Plan of Care (Signed)
Problem: Education: Goal: Knowledge of [place] [facility] Education information/materials will improve Outcome: Progressing Individualization of care  Pt prefers to be called Laurie MastSamantha, pt lives with her mom.     Past Medical History   Diagnosis  Date   .  Sleep apnea     .  GERD (gastroesophageal reflux disease)     .  Hyperlipidemia     .  Depression     .  Anxiety     .  PTSD (post-traumatic stress disorder)     .  Obesity     .  Anemia     .  Vitamin D deficiency     .  Nontoxic uninodular goiter     .  Multiple gastric ulcers     .  H pylori ulcer            Pt has been medical manage with home medications.

## 2015-09-04 NOTE — Progress Notes (Signed)
[facility] - [place] at [facility]   PATIENT NAME: Laurie AsalSamantha Gains    MR#:  [id number]  DATE OF BIRTH:  May 14, 1976  SUBJECTIVE:  CHIEF COMPLAINT:  No chief complaint on file.  has nausea but no vomiting or diarrhea.  REVIEW OF SYSTEMS:  CONSTITUTIONAL: No fever, fatigue or weakness.  EYES: No blurred or double vision.  EARS, NOSE, AND THROAT: No tinnitus or ear pain.  RESPIRATORY: No cough, shortness of breath, wheezing or hemoptysis.  CARDIOVASCULAR: No chest pain, orthopnea, edema.  GASTROINTESTINAL: Has nausea, no vomiting, diarrhea or abdominal pain.  GENITOURINARY: No dysuria, hematuria.  ENDOCRINE: No polyuria, nocturia,  HEMATOLOGY: No anemia, easy bruising or bleeding SKIN: No rash or lesion. MUSCULOSKELETAL: No joint pain or arthritis.   NEUROLOGIC: No tingling, numbness, weakness.  PSYCHIATRY: No anxiety or depression.   DRUG ALLERGIES:  No Known Allergies  VITALS:  Blood pressure 125/72, pulse 74, temperature 98.2 F (36.8 C), temperature source Oral, resp. rate 18, height 5\' 3"  (1.6 m), weight 79.561 kg (175 lb 6.4 oz), last menstrual period 09/03/2015, SpO2 100 %.  PHYSICAL EXAMINATION:  GENERAL:  39 y.o.-year-old patient lying in the bed with no acute distress.  EYES: Pupils equal, round, reactive to light and accommodation. No scleral icterus. Extraocular muscles intact.  HEENT: Head atraumatic, normocephalic. Oropharynx and nasopharynx clear. Moist oral mucosa. NECK:  Supple, no jugular venous distention. No thyroid enlargement, no tenderness.  LUNGS: Normal breath sounds bilaterally, no wheezing, rales,rhonchi or crepitation. No use of accessory muscles of respiration.  CARDIOVASCULAR: S1, S2 normal. No murmurs, rubs, or gallops.  ABDOMEN: Soft, nontender, nondistended. Bowel sounds present. No organomegaly or mass.  EXTREMITIES: No pedal edema, cyanosis, or clubbing.  NEUROLOGIC: Cranial nerves II through XII are intact. Muscle  strength 5/5 in all extremities. Sensation intact. Gait not checked.  PSYCHIATRIC: The patient is alert and oriented x 3.  SKIN: No obvious rash, lesion, or ulcer.    LABORATORY PANEL:   CBC  Recent Labs Lab 09/03/15 1841  WBC 12.2*  HGB 12.2  HCT 37.1  PLT 288   ------------------------------------------------------------------------------------------------------------------  Chemistries   Recent Labs Lab 09/03/15 1841  NA 141  K 3.8  CL 103  CO2 26  GLUCOSE 115*  BUN 8  CREATININE 0.64  CALCIUM 9.6  AST 17  ALT 14  ALKPHOS 72  BILITOT 0.3   ------------------------------------------------------------------------------------------------------------------  Cardiac Enzymes No results for input(s): TROPONINI in the last 168 hours. ------------------------------------------------------------------------------------------------------------------  RADIOLOGY:  [place] Abdomen Limited Ruq  09/03/2015  CLINICAL DATA:  Epigastric pain, nausea and vomiting EXAM: US ABDOMEN LIMITED - RIGHT UPPER QUADRANT COMPARISON:  07/22/2015 FINDINGS: Gallbladder: Non mobile non shadowing echogenic focus of the posterior gallbladder wall, likely a small polyp measuring 7 x 13 mm. No conclusive calculi. No gallbladder wall thickening or pericholecystic fluid. No tenderness over the gallbladder Common bile duct: Diameter: 3.0 mm Liver: Indeterminate 2.4 cm right lobe hypoechoic lesion, unchanged from 07/22/2015. MRI pre and post-contrast recommended for characterization. IMPRESSION: 1. Probable gallbladder polyp.  No conclusive calculi. 2. Indeterminate right lobe liver lesion. Recommend MRI pre and postcontrast for characterization. Electronically Signed   By: Ellery Plunkaniel R Mitchell M.D.   On: 09/03/2015 04:14    EKG:   Orders placed or performed in visit on 09/03/15  . EKG 12-Lead  . EKG 12-Lead  . EKG 12-Lead    ASSESSMENT AND PLAN:   1. INTRACTABLE NAUSEA, VOMITING, UNSPECIFIED: better. On  clear liquid diet. reglan and Phenergan as required,  pain medication, Protonix and Carafate. EGD tomorrow per Dr. Servando Snare.  2. Hypertension essential: Controlled, on low-dose of amlodipine 3. GERD without esophagitis: Continue PPI therapy. 4. Tobacco use. Smoking cessation was counseled for 3 minutes.     All the records are reviewed and case discussed with Care Management/Social Workerr. Management plans discussed with the patient, family and they are in agreement.  CODE STATUS: Full code  TOTAL TIME TAKING CARE OF THIS PATIENT: 42 minutes.   POSSIBLE D/C IN 1-2 DAYS, DEPENDING ON CLINICAL CONDITION.   Shaune Pollack M.D on 09/04/2015 at 5:34 PM  Between 7am to 6pm - Pager - [phone]  After 6pm go to [email or web address] - password [email or web address] [facility]  [facility]  Office  [phone]  CC: Primary care physician; Baruch Gouty, MD

## 2015-09-04 NOTE — Consult Note (Addendum)
[facility]  [address], [address] [place], [place] [address] Phone: [phone] Fax : [phone]  Consultation  Referring Provider:     No ref. provider found Primary Care Physician:  Baruch Gouty, MD Primary Gastroenterologist:  Dr. Mechele Collin         Reason for Consultation:     Recurrent nausea and vomiting  Date of Admission:  09/03/2015 Date of Consultation:  09/04/2015         HPI:   Laurie Frederick is a 39 y.o. female who comes in with recurrent nausea vomiting. The patient was seen by Dr. Mechele Collin was found to have H. pylori and some acute gastritis. The was then treated for H. pylori and she states that she finished all treatment. The patient has then been seen again by Dr. Bluford Kaufmann for another admission for her nausea and vomiting and was treated symptomatically. The patient states she was on a bland diet and then started to advance her diet and started to have severe vomiting the day before admission. The patient was fine prior to that without any nausea vomiting and then states that after the vomiting started she continued to have abdominal pain in the epigastric region and also had continued nausea. The patient is convinced she has an ulcer. She was able to tolerate a liquid diet today. He should has also seen me for nausea and vomiting in the recent past.  Past Medical History  Diagnosis Date  . Sleep apnea   . GERD (gastroesophageal reflux disease)   . Hyperlipidemia   . Depression   . Anxiety   . PTSD (post-traumatic stress disorder)   . Obesity   . Anemia   . Vitamin D deficiency   . Nontoxic uninodular goiter   . Multiple gastric ulcers   . H pylori ulcer     Past Surgical History  Procedure Laterality Date  . Cesarean section    . Esophagogastroduodenoscopy (egd) with propofol Left 07/07/2015    Procedure: ESOPHAGOGASTRODUODENOSCOPY (EGD) WITH PROPOFOL;  Surgeon: Scot Jun, MD;  Location: [facility] ENDOSCOPY;  Service: Endoscopy;  Laterality: Left;    . Cesarean section      Prior to Admission medications   Medication Sig Start Date End Date Taking? Authorizing Provider  alprazolam Prudy Feeler) 2 MG tablet Take 2 mg by mouth 2 (two) times daily as needed for anxiety.    Yes Historical Provider, MD  citalopram (CELEXA) 40 MG tablet Take 40 mg by mouth at bedtime.   Yes Historical Provider, MD  clarithromycin (BIAXIN) 500 MG tablet Take 500 mg by mouth 2 (two) times daily. 09/01/15 09/11/15 Yes Historical Provider, MD  omeprazole (PRILOSEC) 20 MG capsule Take 1 capsule (20 mg total) by mouth 2 (two) times daily. 09/03/15 09/02/16 Yes Darci Current, MD  ondansetron (ZOFRAN ODT) 4 MG disintegrating tablet Take 1 tablet (4 mg total) by mouth every 8 (eight) hours as needed for nausea or vomiting. 09/03/15  Yes Darci Current, MD  pantoprazole (PROTONIX) 40 MG tablet Take 1 tablet (40 mg total) by mouth 2 (two) times daily before a meal. 07/15/15  Yes Srikar Sudini, MD  promethazine (PHENERGAN) 25 MG suppository Place 25 mg rectally every 6 (six) hours as needed for nausea or vomiting.   Yes Historical Provider, MD  QUEtiapine (SEROQUEL) 50 MG tablet Take 100 mg by mouth at bedtime.   Yes Historical Provider, MD  sucralfate (CARAFATE) 1 G tablet Take 1 g by mouth 4 (four) times daily -  with meals and at bedtime.   Yes Historical Provider, MD  zolpidem (AMBIEN) 10 MG tablet Take 10 mg by mouth at bedtime.   Yes Historical Provider, MD  nicotine (NICODERM CQ - DOSED IN MG/24 HR) 7 mg/24hr patch Place 1 patch (7 mg total) onto the skin daily. Patient not taking: Reported on 08/16/2015 07/16/15   Milagros LollSrikar Sudini, MD  promethazine (PHENERGAN) 25 MG tablet Take 1 tablet (25 mg total) by mouth every 4 (four) hours as needed for nausea or vomiting. Patient not taking: Reported on 09/03/2015 07/31/15   Emily FilbertJonathan E Williams, MD    Family History  Problem Relation Age of Onset  . Lung cancer Father   . Cancer Father     lung cancer  . Alcohol abuse Father   .  Lung cancer Paternal Aunt   . Cancer Paternal Aunt     lung  . Bone cancer Paternal Uncle   . Cancer Paternal Uncle     pancreatic  . Depression Mother   . Anxiety disorder Mother   . Arthritis Mother   . Hyperlipidemia Mother   . Hypertension Maternal Grandmother   . Diabetes Paternal Grandmother   . Heart disease Paternal Grandmother   . Hypertension Paternal Grandmother   . Asthma Son   . Vision loss Neg Hx   . COPD Neg Hx      Social History  Substance Use Topics  . Smoking status: Current Some Day Smoker -- 0.50 packs/day for 17 years    Types: Cigarettes  . Smokeless tobacco: Never Used  . Alcohol Use: Yes     Comment: occasional    Allergies as of 09/03/2015  . (No Known Allergies)    Review of Systems:    All systems reviewed and negative except where noted in HPI.   Physical Exam:  Vital signs in last 24 hours: Temp:  [98.4 F (36.9 C)-99 F (37.2 C)] 98.4 F (36.9 C) (11/05 0518) Pulse Rate:  [59-86] 86 (11/05 0518) Resp:  [16-22] 18 (11/04 2039) BP: (105-182)/(55-102) 166/92 mmHg (11/05 0518) SpO2:  [98 %-100 %] 98 % (11/05 0518) Weight:  [175 lb 6.4 oz (79.561 kg)-181 lb (82.101 kg)] 175 lb 6.4 oz (79.561 kg) (11/04 2203) Last BM Date: 09/03/15 General:   Pleasant, cooperative in NAD Head:  Normocephalic and atraumatic. Eyes:   No icterus.   Conjunctiva pink. PERRLA. Ears:  Normal auditory acuity. Neck:  Supple; no masses or thyroidomegaly Lungs: Respirations even and unlabored. Lungs clear to auscultation bilaterally.   No wheezes, crackles, or rhonchi.  Heart:  Regular rate and rhythm;  Without murmur, clicks, rubs or gallops Abdomen:  Soft, nondistended, epigastric tenderness. Normal bowel sounds. No appreciable masses or hepatomegaly.  No rebound or guarding.  Rectal:  Not performed. Msk:  Symmetrical without gross deformities.  Reducible epigastric pain with flexion of the abdominal wall muscles Extremities:  Without edema, cyanosis or  clubbing. Neurologic:  Alert and oriented x3;  grossly normal neurologically. Skin:  Intact without significant lesions or rashes. Cervical Nodes:  No significant cervical adenopathy. Psych:  Alert and cooperative. Normal affect.  LAB RESULTS:  Recent Labs  09/03/15 0223 09/03/15 1841  WBC 8.4 12.2*  HGB 12.7 12.2  HCT 39.4 37.1  PLT 291 288   BMET  Recent Labs  09/03/15 0223 09/03/15 1841  NA 140 141  K 3.5 3.8  CL 107 103  CO2 27 26  GLUCOSE 142* 115*  BUN 8 8  CREATININE 0.58 0.64  CALCIUM 9.2 9.6   LFT  Recent Labs  2015/09/17 1841  PROT 8.3*  ALBUMIN 4.6  AST 17  ALT 14  ALKPHOS 72  BILITOT 0.3   PT/INR No results for input(s): LABPROT, INR in the last 72 hours.  STUDIES: US Abdomen Limited Ruq  September 17, 2015  CLINICAL DATA:  Epigastric pain, nausea and vomiting EXAM: US ABDOMEN LIMITED - RIGHT UPPER QUADRANT COMPARISON:  07/22/2015 FINDINGS: Gallbladder: Non mobile non shadowing echogenic focus of the posterior gallbladder wall, likely a small polyp measuring 7 x 13 mm. No conclusive calculi. No gallbladder wall thickening or pericholecystic fluid. No tenderness over the gallbladder Common bile duct: Diameter: 3.0 mm Liver: Indeterminate 2.4 cm right lobe hypoechoic lesion, unchanged from 07/22/2015. MRI pre and post-contrast recommended for characterization. IMPRESSION: 1. Probable gallbladder polyp.  No conclusive calculi. 2. Indeterminate right lobe liver lesion. Recommend MRI pre and postcontrast for characterization. Electronically Signed   By: Ellery Plunk M.D.   On: 09/17/2015 04:14      Impression / Plan:   Laurie Frederick is a 39 y.o. y/o female with recurrent nausea vomiting with gastritis found by Dr. Mechele Collin at her last EGD. The patient was also found to have H. pylori and states she was treated for that. The patient was good up until yesterday when she started having 20 episodes of vomiting. The patient has a physical exam consistent with  epigastric pain due to musculoskeletal strain likely from her recurrent vomiting. The patient will be set up for an EGD for tomorrow. If the EGD is normal I wouldn't look for another cause of her vomiting such as psychogenic or food related. The mildly elevated lipase does not constitute this patient as having pancreatitis. This is likely from her recurrent vomiting. The patient has been explained the plan and agrees with it.   Thank you for involving me in the care of this patient.        Darlina Rumpf, MD  09/04/2015, 12:53 PM   Note: This dictation was prepared with Dragon dictation along with smaller phrase technology. Any transcriptional errors that result from this process are unintentional.

## 2015-09-04 NOTE — Progress Notes (Signed)
Initial Nutrition Assessment      INTERVENTION:  Meals and snacks: Cater to pt preferences, within diet restrictions   NUTRITION DIAGNOSIS:   Inadequate oral intake related to altered GI function as evidenced by per patient/family report.    GOAL:   Patient will meet greater than or equal to 90% of their needs    MONITOR:    (Energy intake, Digestive system)  REASON FOR ASSESSMENT:   Malnutrition Screening Tool    ASSESSMENT:      Pt admitted with nausea, vomiting times 2 days, essential HTN  Past Medical History  Diagnosis Date  . Sleep apnea   . GERD (gastroesophageal reflux disease)   . Hyperlipidemia   . Depression   . Anxiety   . PTSD (post-traumatic stress disorder)   . Obesity   . Anemia   . Vitamin D deficiency   . Nontoxic uninodular goiter   . Multiple gastric ulcers   . H pylori ulcer     Current Nutrition: taking sips of liquids  Food/Nutrition-Related History: pt reports decreased intake for the past few days prior to admission   Scheduled Medications:  . citalopram  40 mg Oral QHS  . clarithromycin  500 mg Oral BID  . heparin  5,000 Units Subcutaneous 3 times per day  . LORazepam  2 mg Intravenous Once  . pantoprazole  40 mg Oral BID AC  . QUEtiapine  100 mg Oral QHS  . sucralfate  1 g Oral TID WC & HS  . zolpidem  10 mg Oral QHS    Continuous Medications:  . sodium chloride 100 mL/hr at 09/04/15 0926     Electrolyte/Renal Profile and Glucose Profile:   Recent Labs Lab 09/03/15 0223 09/03/15 1841  NA 140 141  K 3.5 3.8  CL 107 103  CO2 27 26  BUN 8 8  CREATININE 0.58 0.64  CALCIUM 9.2 9.6  GLUCOSE 142* 115*   Protein Profile:  Recent Labs Lab 09/03/15 0223 09/03/15 1841  ALBUMIN 4.8 4.6    Gastrointestinal Profile: complains of nausea Last BM:11/4   Nutrition-Focused Physical Exam Findings: Nutrition-Focused physical exam completed. Findings are WDL for fat depletion, muscle depletion, and edema.      Weight Change: noted 6% weight loss in the last 2 months    Diet Order:  Diet clear liquid Room service appropriate?: Yes; Fluid consistency:: Thin Diet NPO time specified  Skin:   reviewed   Height:   Ht Readings from Last 1 Encounters:  09/03/15 5\' 3"  (1.6 m)    Weight:   Wt Readings from Last 1 Encounters:  09/03/15 175 lb 6.4 oz (79.561 kg)     BMI:  Body mass index is 31.08 kg/(m^2).  Estimated Nutritional Needs:   Kcal:  BEE 1174 kcals (IF 1.0-1.3, Af 1.3) [id number] kcals/d Using IBW of 52kg  Protein:  (1.0-1.2 g/kg) 52-62 g/d  Fluid:  (25-5530ml/kg) 1300-155460ml/d  EDUCATION NEEDS:   No education needs identified at this time  MODERATE Care Level  Ameri Cahoon B. Freida BusmanAllen, RD, LDN [phone] (pager)

## 2015-09-05 ENCOUNTER — Encounter
Admission: EM | Disposition: A | Discharge status: Home / Self Care | Payer: Self-pay | Source: Home / Self Care | Attending: Student

## 2015-09-05 ENCOUNTER — Encounter: Payer: Self-pay | Admitting: *Deleted

## 2015-09-05 DIAGNOSIS — R112 Nausea with vomiting, unspecified: Secondary | ICD-10-CM | POA: Diagnosis not present

## 2015-09-05 HISTORY — PX: ESOPHAGOGASTRODUODENOSCOPY (EGD) WITH PROPOFOL: SHX5813

## 2015-09-05 SURGERY — ESOPHAGOGASTRODUODENOSCOPY (EGD) WITH PROPOFOL
Anesthesia: Monitor Anesthesia Care

## 2015-09-05 MED ORDER — FENTANYL CITRATE (PF) 100 MCG/2ML IJ SOLN
INTRAMUSCULAR | Status: AC
  Filled 2015-09-05: qty 2

## 2015-09-05 MED ORDER — MIDAZOLAM HCL 5 MG/5ML IJ SOLN
INTRAMUSCULAR | Status: DC | PRN
  Administered 2015-09-05 (×4): 2 mg via INTRAVENOUS
  Administered 2015-09-05: 1 mg via INTRAVENOUS

## 2015-09-05 MED ORDER — MIDAZOLAM HCL 5 MG/5ML IJ SOLN
INTRAMUSCULAR | Status: AC
  Filled 2015-09-05: qty 5

## 2015-09-05 MED ORDER — FENTANYL CITRATE (PF) 100 MCG/2ML IJ SOLN
INTRAMUSCULAR | Status: DC | PRN
  Administered 2015-09-05 (×2): 25 ug via INTRAVENOUS
  Administered 2015-09-05: 50 ug via INTRAVENOUS

## 2015-09-05 NOTE — Discharge Instructions (Signed)
Heart healthy diet. °Activity as tolerated. °Smoking cessation. °

## 2015-09-05 NOTE — Plan of Care (Signed)
Problem: Bowel/Gastric: Goal: Will not experience complications related to bowel motility Outcome: Progressing Dilaudid for pain with relief, Reglan iv for nausea with relief. EGD in am, NPO since midnight. Continue to monitor

## 2015-09-05 NOTE — Discharge Summary (Signed)
[facility] [place] [facility]   PATIENT NAME: Laurie AsalSamantha Frederick    MR#:  [id number]  DATE OF BIRTH:  1976-02-28  DATE OF ADMISSION:  09/03/2015 ADMITTING PHYSICIAN: Wyatt Hasteavid K Hower, MD  DATE OF DISCHARGE: 09/05/2015 PRIMARY CARE PHYSICIAN: Baruch GoutyMelinda Lada, MD    ADMISSION DIAGNOSIS:  Abdominal pain, epigastric [R10.13] Gastritis [K29.70] Acute pancreatitis, unspecified pancreatitis type [K85.9]   DISCHARGE DIAGNOSIS:   Epigastric pain SECONDARY DIAGNOSIS:   Past Medical History  Diagnosis Date  . Sleep apnea   . GERD (gastroesophageal reflux disease)   . Hyperlipidemia   . Depression   . Anxiety   . PTSD (post-traumatic stress disorder)   . Obesity   . Anemia   . Vitamin D deficiency   . Nontoxic uninodular goiter   . Multiple gastric ulcers   . H pylori ulcer     HOSPITAL COURSE:   1. INTRACTABLE NAUSEA, VOMITING, UNSPECIFIED: Improved on treatment reglan and Phenergan as required, pain medication, Protonix and Carafate. EGD didn't show any gastritis or gastric ulcer per Dr. Servando SnareWohl.  2. Hypertension essential: Controlled, on low-dose of amlodipine 3. GERD without esophagitis: Continue PPI therapy. 4. Tobacco use. Smoking cessation was counseled for 3 minutes.  DISCHARGE CONDITIONS:   Stable, discharge to home today  CONSULTS OBTAINED:  Treatment Team:  Wyatt Hasteavid K Hower, MD Midge Miniumarren Wohl, MD  DRUG ALLERGIES:  No Known Allergies  DISCHARGE MEDICATIONS:   Current Discharge Medication List    CONTINUE these medications which have NOT CHANGED   Details  alprazolam (XANAX) 2 MG tablet Take 2 mg by mouth 2 (two) times daily as needed for anxiety.     citalopram (CELEXA) 40 MG tablet Take 40 mg by mouth at bedtime.    clarithromycin (BIAXIN) 500 MG tablet Take 500 mg by mouth 2 (two) times daily.    omeprazole (PRILOSEC) 20 MG capsule Take 1 capsule (20 mg total) by mouth 2 (two) times daily. Qty: 60 capsule, Refills: 1    ondansetron  (ZOFRAN ODT) 4 MG disintegrating tablet Take 1 tablet (4 mg total) by mouth every 8 (eight) hours as needed for nausea or vomiting. Qty: 20 tablet, Refills: 0    pantoprazole (PROTONIX) 40 MG tablet Take 1 tablet (40 mg total) by mouth 2 (two) times daily before a meal. Qty: 60 tablet, Refills: 0    promethazine (PHENERGAN) 25 MG suppository Place 25 mg rectally every 6 (six) hours as needed for nausea or vomiting.    QUEtiapine (SEROQUEL) 50 MG tablet Take 100 mg by mouth at bedtime.    sucralfate (CARAFATE) 1 G tablet Take 1 g by mouth 4 (four) times daily -  with meals and at bedtime.    zolpidem (AMBIEN) 10 MG tablet Take 10 mg by mouth at bedtime.    nicotine (NICODERM CQ - DOSED IN MG/24 HR) 7 mg/24hr patch Place 1 patch (7 mg total) onto the skin daily. Qty: 28 patch, Refills: 0    promethazine (PHENERGAN) 25 MG tablet Take 1 tablet (25 mg total) by mouth every 4 (four) hours as needed for nausea or vomiting. Qty: 12 tablet, Refills: 0         DISCHARGE INSTRUCTIONS:    If you experience worsening of your admission symptoms, develop shortness of breath, life threatening emergency, suicidal or homicidal thoughts you must seek medical attention immediately by calling 911 or calling your MD immediately  if symptoms less severe.  You Must read complete instructions/literature along with all the  possible adverse reactions/side effects for all the Medicines you take and that have been prescribed to you. Take any new Medicines after you have completely understood and accept all the possible adverse reactions/side effects.   Please note  You were cared for by a hospitalist during your hospital stay. If you have any questions about your discharge medications or the care you received while you were in the hospital after you are discharged, you can call the unit and asked to speak with the hospitalist on call if the hospitalist that took care of you is not available. Once you are  discharged, your primary care physician will handle any further medical issues. Please note that NO REFILLS for any discharge medications will be authorized once you are discharged, as it is imperative that you return to your primary care physician (or establish a relationship with a primary care physician if you do not have one) for your aftercare needs so that they can reassess your need for medications and monitor your lab values.    Today   SUBJECTIVE   Nausea.   VITAL SIGNS:  Blood pressure 129/81, pulse 65, temperature 97.7 F (36.5 C), temperature source Oral, resp. rate 18, height  (1.6 m), weight 79.561 kg (175 lb 6.4 oz), last menstrual period 09/03/2015, SpO2 100 %.  I/O:   Intake/Output Summary (Last 24 hours) at 09/05/15 1424 Last data filed at 09/05/15 0708  Gross per 24 hour  Intake 2868.34 ml  Output      0 ml  Net 2868.34 ml    PHYSICAL EXAMINATION:  GENERAL:  39 y.o.-year-old patient lying in the bed with no acute distress.  EYES: Pupils equal, round, reactive to light and accommodation. No scleral icterus. Extraocular muscles intact.  HEENT: Head atraumatic, normocephalic. Oropharynx and nasopharynx clear.  NECK:  Supple, no jugular venous distention. No thyroid enlargement, no tenderness.  LUNGS: Normal breath sounds bilaterally, no wheezing, rales,rhonchi or crepitation. No use of accessory muscles of respiration.  CARDIOVASCULAR: S1, S2 normal. No murmurs, rubs, or gallops.  ABDOMEN: Soft, non-tender, non-distended. Bowel sounds present. No organomegaly or mass.  EXTREMITIES: No pedal edema, cyanosis, or clubbing.  NEUROLOGIC: Cranial nerves II through XII are intact. Muscle strength 5/5 in all extremities. Sensation intact. Gait not checked.  PSYCHIATRIC: The patient is alert and oriented x 3.  SKIN: No obvious rash, lesion, or ulcer.   DATA REVIEW:   CBC  Recent Labs Lab 09/03/15 1841  WBC 12.2*  HGB 12.2  HCT 37.1  PLT 288     Chemistries   Recent Labs Lab 09/03/15 1841  NA 141  K 3.8  CL 103  CO2 26  GLUCOSE 115*  BUN 8  CREATININE 0.64  CALCIUM 9.6  AST 17  ALT 14  ALKPHOS 72  BILITOT 0.3    Cardiac Enzymes No results for input(s): TROPONINI in the last 168 hours.  Microbiology Results  Results for orders placed or performed during the hospital encounter of 07/12/15  Urine culture     Status: None   Collection Time: 07/12/15  7:30 AM  Result Value Ref Range Status   Specimen Description URINE, CLEAN CATCH  Final   Special Requests NONE  Final   Culture MULTIPLE SPECIES PRESENT, SUGGEST RECOLLECTION  Final   Report Status 07/14/2015 FINAL  Final    RADIOLOGY:  No results found.      Management plans discussed with the patient, family and they are in agreement.  CODE STATUS:  Code Status Orders        Start     Ordered   09/03/15 2036  Full code   Continuous     09/03/15 2036      TOTAL TIME TAKING CARE OF THIS PATIENT: 36 minutes.    Shaune Pollack M.D on 09/05/2015 at 2:24 PM  Between 7am to 6pm - Pager - [phone]  After 6pm go to [email or web address] - password [email or web address] [facility]  [place] [facility]  Office  [phone]  CC: Primary care physician; Baruch Gouty, MD

## 2015-09-05 NOTE — Plan of Care (Signed)
Problem: Bowel/Gastric: Goal: Will not experience complications related to bowel motility Outcome: Completed/Met Date Met:  09/05/15 Pt had a EGD today. VSS. Dilaudid and reglan given upon return to the unit for epigastric pain/nausea with good effect. Tolerates full liquid diet. Discharge instructions given and explained to pt. Pt instructed to schedule appointments. Educational handouts about low sodium diet and smoking cessation given and explained to pt. Escorted on a wheelchair.

## 2015-09-05 NOTE — Op Note (Signed)
[facility] Patient Name: Laurie Frederick Procedure Date: 09/05/2015 10:42 AM MRN: [id number] Account #: [id number] Date of Birth: 04/26/1976 Admit Type: Inpatient Age: 39 Room: [facility] ENDO ROOM 4 Gender: Female Note Status: Finalized Procedure:         Upper GI endoscopy Indications:       Nausea with vomiting Providers:         Laurie Minium, MD Referring MD:      Laurie Frederick (Referring MD) Medicines:         Fentanyl 100 micrograms IV, Midazolam 9 mg IV Complications:     No immediate complications. Procedure:         Pre-Anesthesia Assessment:                    - Prior to the procedure, a History and Physical was                     performed, and patient medications and allergies were                     reviewed. The patient's tolerance of previous anesthesia                     was also reviewed. The risks and benefits of the procedure                     and the sedation options and risks were discussed with the                     patient. All questions were answered, and informed consent                     was obtained. Prior Anticoagulants: The patient has taken                     no previous anticoagulant or antiplatelet agents. ASA                     Grade Assessment: II - A patient with mild systemic                     disease. After reviewing the risks and benefits, the                     patient was deemed in satisfactory condition to undergo                     the procedure.                    After obtaining informed consent, the endoscope was passed                     under direct vision. Throughout the procedure, the                     patient's blood pressure, pulse, and oxygen saturations                     were monitored continuously. The Endoscope was introduced                     through the mouth, and advanced to the second part of  duodenum. The upper GI endoscopy was accomplished without                   difficulty. The patient tolerated the procedure well. Findings:      The examined esophagus was normal.      The entire examined stomach was normal.      The examined duodenum was normal. Impression:        - Normal esophagus.                    - Normal stomach.                    - Normal examined duodenum.                    - No specimens collected. Recommendation:    - Continue present medications. Procedure Code(s): --- Professional ---                    [phone], Esophagogastroduodenoscopy, flexible, transoral;                     diagnostic, including collection of specimen(s) by                     brushing or washing, when performed (separate procedure) Diagnosis Code(s): --- Professional ---                    R11.2, Nausea with vomiting, unspecified CPT copyright 2014 American Medical Association. All rights reserved. The codes documented in this report are preliminary and upon coder review may  be revised to meet current compliance requirements. Laurie Miniumarren [name], MD 09/05/2015 12:01:49 PM This report has been signed electronically. Number of Addenda: 0 Note Initiated On: 09/05/2015 10:42 AM      [facility]

## 2015-09-07 ENCOUNTER — Emergency Department: Payer: Medicaid Other

## 2015-09-07 ENCOUNTER — Encounter: Payer: Self-pay | Admitting: Emergency Medicine

## 2015-09-07 ENCOUNTER — Emergency Department
Admission: EM | Admit: 2015-09-07 | Discharge: 2015-09-07 | Disposition: A | Discharge status: Home / Self Care | Payer: Medicaid Other | Attending: Emergency Medicine | Admitting: Emergency Medicine

## 2015-09-07 ENCOUNTER — Emergency Department
Admission: EM | Admit: 2015-09-07 | Discharge: 2015-09-07 | Disposition: A | Discharge status: Home / Self Care | Payer: Medicaid Other | Source: Home / Self Care | Attending: Emergency Medicine | Admitting: Emergency Medicine

## 2015-09-07 DIAGNOSIS — Z3202 Encounter for pregnancy test, result negative: Secondary | ICD-10-CM | POA: Insufficient documentation

## 2015-09-07 DIAGNOSIS — N760 Acute vaginitis: Secondary | ICD-10-CM | POA: Diagnosis not present

## 2015-09-07 DIAGNOSIS — R197 Diarrhea, unspecified: Principal | ICD-10-CM

## 2015-09-07 DIAGNOSIS — G8929 Other chronic pain: Secondary | ICD-10-CM

## 2015-09-07 DIAGNOSIS — E876 Hypokalemia: Secondary | ICD-10-CM

## 2015-09-07 DIAGNOSIS — F1310 Sedative, hypnotic or anxiolytic abuse, uncomplicated: Secondary | ICD-10-CM | POA: Diagnosis not present

## 2015-09-07 DIAGNOSIS — Z792 Long term (current) use of antibiotics: Secondary | ICD-10-CM | POA: Diagnosis not present

## 2015-09-07 DIAGNOSIS — Z79899 Other long term (current) drug therapy: Secondary | ICD-10-CM | POA: Insufficient documentation

## 2015-09-07 DIAGNOSIS — R112 Nausea with vomiting, unspecified: Secondary | ICD-10-CM | POA: Diagnosis not present

## 2015-09-07 DIAGNOSIS — F17210 Nicotine dependence, cigarettes, uncomplicated: Secondary | ICD-10-CM | POA: Insufficient documentation

## 2015-09-07 DIAGNOSIS — R109 Unspecified abdominal pain: Secondary | ICD-10-CM

## 2015-09-07 DIAGNOSIS — B9689 Other specified bacterial agents as the cause of diseases classified elsewhere: Secondary | ICD-10-CM

## 2015-09-07 LAB — COMPREHENSIVE METABOLIC PANEL
ALT: 14 U/L (ref 14–54)
AST: 15 U/L (ref 15–41)
Albumin: 4.5 g/dL (ref 3.5–5.0)
Alkaline Phosphatase: 73 U/L (ref 38–126)
Anion gap: 7 (ref 5–15)
CHLORIDE: 101 mmol/L (ref 101–111)
CO2: 33 mmol/L — AB (ref 22–32)
CREATININE: 0.65 mg/dL (ref 0.44–1.00)
Calcium: 9.2 mg/dL (ref 8.9–10.3)
GFR calc Af Amer: >60 mL/min (ref >60)
GFR calc non Af Amer: >60 mL/min (ref >60)
Glucose, Bld: 121 mg/dL — ABNORMAL HIGH (ref 65–99)
Potassium: 2.7 mmol/L — CL (ref 3.5–5.1)
SODIUM: 141 mmol/L (ref 135–145)
Total Bilirubin: 0.5 mg/dL (ref 0.3–1.2)
Total Protein: 7.9 g/dL (ref 6.5–8.1)

## 2015-09-07 LAB — URINALYSIS COMPLETE WITH MICROSCOPIC (ARMC ONLY)
BILIRUBIN URINE: NEGATIVE
Bilirubin Urine: NEGATIVE
GLUCOSE, UA: NEGATIVE mg/dL
Glucose, UA: NEGATIVE mg/dL
Leukocytes, UA: NEGATIVE
Leukocytes, UA: NEGATIVE
NITRITE: NEGATIVE
Nitrite: NEGATIVE
PH: 9 — AB (ref 5.0–8.0)
PROTEIN: NEGATIVE mg/dL
PROTEIN: NEGATIVE mg/dL
SPECIFIC GRAVITY, URINE: 1.005 (ref 1.005–1.030)
Specific Gravity, Urine: 1.015 (ref 1.005–1.030)
pH: 8 (ref 5.0–8.0)

## 2015-09-07 LAB — BASIC METABOLIC PANEL
ANION GAP: 8 (ref 5–15)
BUN: <5 mg/dL — ABNORMAL LOW (ref 6–20)
CALCIUM: 8.7 mg/dL — AB (ref 8.9–10.3)
CO2: 29 mmol/L (ref 22–32)
Chloride: 103 mmol/L (ref 101–111)
Creatinine, Ser: 0.56 mg/dL (ref 0.44–1.00)
GFR calc Af Amer: >60 mL/min (ref >60)
Glucose, Bld: 124 mg/dL — ABNORMAL HIGH (ref 65–99)
Potassium: 3.2 mmol/L — ABNORMAL LOW (ref 3.5–5.1)
Sodium: 140 mmol/L (ref 135–145)

## 2015-09-07 LAB — URINE DRUG SCREEN, QUALITATIVE (ARMC ONLY)
AMPHETAMINES, UR SCREEN: NOT DETECTED
BENZODIAZEPINE, UR SCRN: POSITIVE — AB
Barbiturates, Ur Screen: NOT DETECTED
Cannabinoid 50 Ng, Ur ~~LOC~~: NOT DETECTED
Cocaine Metabolite,Ur ~~LOC~~: NOT DETECTED
MDMA (ECSTASY) UR SCREEN: NOT DETECTED
Methadone Scn, Ur: NOT DETECTED
Opiate, Ur Screen: NOT DETECTED
PHENCYCLIDINE (PCP) UR S: NOT DETECTED
TRICYCLIC, UR SCREEN: NOT DETECTED

## 2015-09-07 LAB — TROPONIN I

## 2015-09-07 LAB — CBC
HCT: 39.5 % (ref 35.0–47.0)
Hemoglobin: 13 g/dL (ref 12.0–16.0)
MCH: 28.9 pg (ref 26.0–34.0)
MCHC: 32.8 g/dL (ref 32.0–36.0)
MCV: 87.9 fL (ref 80.0–100.0)
PLATELETS: 309 10*3/uL (ref 150–440)
RBC: 4.49 MIL/uL (ref 3.80–5.20)
RDW: 14.4 % (ref 11.5–14.5)
WBC: 5.8 10*3/uL (ref 3.6–11.0)

## 2015-09-07 LAB — WET PREP, GENITAL
Clue Cells Wet Prep HPF POC: POSITIVE — AB
Trich, Wet Prep: NONE SEEN
Yeast Wet Prep HPF POC: NONE SEEN

## 2015-09-07 LAB — CHLAMYDIA/NGC RT PCR (ARMC ONLY)
CHLAMYDIA TR: NOT DETECTED
N GONORRHOEAE: NOT DETECTED

## 2015-09-07 LAB — POCT PREGNANCY, URINE: PREG TEST UR: NEGATIVE

## 2015-09-07 LAB — LIPASE, BLOOD: LIPASE: 28 U/L (ref 11–51)

## 2015-09-07 MED ORDER — SODIUM CHLORIDE 0.9 % IV BOLUS (SEPSIS)
1000.0000 mL | Freq: Once | INTRAVENOUS | Status: AC
  Administered 2015-09-07: 1000 mL via INTRAVENOUS

## 2015-09-07 MED ORDER — POTASSIUM CHLORIDE ER 10 MEQ PO TBCR: 20.0000 meq | EXTENDED_RELEASE_TABLET | Freq: Every day | ORAL | Status: DC

## 2015-09-07 MED ORDER — HALOPERIDOL LACTATE 5 MG/ML IJ SOLN
5.0000 mg | Freq: Once | INTRAMUSCULAR | Status: AC
  Administered 2015-09-07: 5 mg via INTRAVENOUS
  Filled 2015-09-07: qty 1

## 2015-09-07 MED ORDER — PROMETHAZINE HCL 25 MG/ML IJ SOLN
6.2500 mg | Freq: Once | INTRAMUSCULAR | Status: AC
  Administered 2015-09-07: 6.25 mg via INTRAVENOUS
  Filled 2015-09-07: qty 1

## 2015-09-07 MED ORDER — POTASSIUM CHLORIDE CRYS ER 20 MEQ PO TBCR
40.0000 meq | EXTENDED_RELEASE_TABLET | Freq: Once | ORAL | Status: AC
Start: 2015-09-07 — End: 2015-09-07
  Administered 2015-09-07: 40 meq via ORAL
  Filled 2015-09-07: qty 2

## 2015-09-07 MED ORDER — POTASSIUM CHLORIDE 10 MEQ/100ML IV SOLN
10.0000 meq | Freq: Once | INTRAVENOUS | Status: AC
  Administered 2015-09-07: 10 meq via INTRAVENOUS
  Filled 2015-09-07: qty 100

## 2015-09-07 MED ORDER — PROMETHAZINE HCL 12.5 MG PO TABS: 12.5000 mg | ORAL_TABLET | Freq: Four times a day (QID) | ORAL | Status: DC | PRN

## 2015-09-07 MED ORDER — OXYCODONE-ACETAMINOPHEN 5-325 MG PO TABS
1.0000 | ORAL_TABLET | Freq: Once | ORAL | Status: AC
  Administered 2015-09-07: 1 via ORAL
  Filled 2015-09-07: qty 1

## 2015-09-07 MED ORDER — METRONIDAZOLE 500 MG PO TABS: 500.0000 mg | ORAL_TABLET | Freq: Two times a day (BID) | ORAL | Status: DC

## 2015-09-07 NOTE — ED Notes (Signed)
Urine pregnancy POCT negative

## 2015-09-07 NOTE — ED Provider Notes (Addendum)
[facility] Emergency Department Provider Note  ____________________________________________   I have reviewed the triage vital signs and the nursing notes.   HISTORY  Chief Complaint Emesis and Abdominal Pain    HPI Laurie Frederick is a 39 y.o. female  who presents today for nausea and vomiting diarrhea. Patient has been seen here 6 times in the last month and a half her nausea vomiting diarrhea. She states that no acute never figure out why she has intractable nausea vomiting diarrhea. She does have history of H. pylori, she has some gastritis on a recent endoscopy done in September for this problem which showed that there was gastritis likely secondary to forced vomiting. The patient states that she has had no fever or chills no hematemesis and no melena or bright red blood per rectum. She does have diffuse epigastric discomfort. She denies smoking marijuana.  Past Medical History  Diagnosis Date  . Sleep apnea   . GERD (gastroesophageal reflux disease)   . Hyperlipidemia   . Depression   . Anxiety   . PTSD (post-traumatic stress disorder)   . Obesity   . Anemia   . Vitamin D deficiency   . Nontoxic uninodular goiter   . Multiple gastric ulcers   . H pylori ulcer     Patient Active Problem List   Diagnosis Date Noted  . Abdominal pain, epigastric   . Gastritis   . Hypokalemia 08/16/2015  . Chronic prescription benzodiazepine use 08/16/2015  . Intractable nausea and vomiting 07/22/2015  . Epigastric pain   . Helicobacter positive gastritis 07/16/2015  . Gastritis and gastroduodenitis 07/16/2015  . Hyperlipidemia 07/13/2015  . Vitamin D deficiency 07/13/2015  . Thyroid nodule 07/13/2015  . Polyp of gallbladder 07/13/2015  . Liver nodule 07/13/2015  . Nausea & vomiting 07/13/2015  . Gastroenteritis 07/06/2015  . 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 Alcohol abuse 04/01/2015    Past Surgical History  Procedure Laterality Date   . Cesarean section    . Esophagogastroduodenoscopy (egd) with propofol Left 07/07/2015    Procedure: ESOPHAGOGASTRODUODENOSCOPY (EGD) WITH PROPOFOL;  Surgeon: Scot Jun, MD;  Location: [facility] ENDOSCOPY;  Service: Endoscopy;  Laterality: Left;  . Cesarean section      Current Outpatient Rx  Name  Route  Sig  Dispense  Refill  . alprazolam (XANAX) 2 MG tablet   Oral   Take 2 mg by mouth 2 (two) times daily as needed for anxiety.          . citalopram (CELEXA) 40 MG tablet   Oral   Take 40 mg by mouth at bedtime.         . clarithromycin (BIAXIN) 500 MG tablet   Oral   Take 500 mg by mouth 2 (two) times daily.         . nicotine (NICODERM CQ - DOSED IN MG/24 HR) 7 mg/24hr patch   Transdermal   Place 1 patch (7 mg total) onto the skin daily. Patient not taking: Reported on 08/16/2015   28 patch   0   . ondansetron (ZOFRAN ODT) 4 MG disintegrating tablet   Oral   Take 1 tablet (4 mg total) by mouth every 8 (eight) hours as needed for nausea or vomiting.   20 tablet   0   . pantoprazole (PROTONIX) 40 MG tablet   Oral   Take 1 tablet (40 mg total) by mouth 2 (two) times daily before a meal.   60 tablet   0   .  promethazine (PHENERGAN) 25 MG tablet   Oral   Take 1 tablet (25 mg total) by mouth every 4 (four) hours as needed for nausea or vomiting. Patient not taking: Reported on 09/03/2015   12 tablet   0   . QUEtiapine (SEROQUEL) 50 MG tablet   Oral   Take 100 mg by mouth at bedtime.         . sucralfate (CARAFATE) 1 G tablet   Oral   Take 1 g by mouth 4 (four) times daily -  with meals and at bedtime.         Marland Kitchen zolpidem (AMBIEN) 10 MG tablet   Oral   Take 10 mg by mouth at bedtime.           Allergies Review of patient's allergies indicates no known allergies.  Family History  Problem Relation Age of Onset  . Lung cancer Father   . Cancer Father     lung cancer  . Alcohol abuse Father   . Lung cancer Paternal Aunt   . Cancer Paternal  Aunt     lung  . Bone cancer Paternal Uncle   . Cancer Paternal Uncle     pancreatic  . Depression Mother   . Anxiety disorder Mother   . Arthritis Mother   . Hyperlipidemia Mother   . Hypertension Maternal Grandmother   . Diabetes Paternal Grandmother   . Heart disease Paternal Grandmother   . Hypertension Paternal Grandmother   . Asthma Son   . Vision loss Neg Hx   . COPD Neg Hx     Social History Social History  Substance Use Topics  . Smoking status: Current Some Day Smoker -- 0.50 packs/day for 17 years    Types: Cigarettes  . Smokeless tobacco: Never Used  . Alcohol Use: Yes     Comment: occasional    Review of Systems Constitutional: No fever/chills Eyes: No visual changes. ENT: No sore throat. No stiff neck no neck pain Cardiovascular: Denies chest pain. Respiratory: Denies shortness of breath. Gastrointestinal:   Positive vomiting.  Positive diarrhea.  No constipation. Genitourinary: Negative for dysuria. Musculoskeletal: Negative lower extremity swelling Skin: Negative for rash. Neurological: Negative for headaches, focal weakness or numbness. 10-point ROS otherwise negative.  ____________________________________________   PHYSICAL EXAM:  VITAL SIGNS: ED Triage Vitals  Enc Vitals Group     BP 09/07/15 0639 186/95 mmHg     Pulse Rate 09/07/15 0639 84     Resp 09/07/15 0639 20     Temp 09/07/15 0639 98 F (36.7 C)     Temp src --      SpO2 09/07/15 0639 100 %     Weight 09/07/15 0639 171 lb (77.565 kg)     Height 09/07/15 0639  (1.6 m)     Head Cir --      Peak Flow --      Pain Score 09/07/15 0640 10     Pain Loc --      Pain Edu? --      Excl. in GC? --     Constitutional: Alert and oriented. Well appearing and in no acute distress. Speaks in a whisper but not toxic in appearance Eyes: Conjunctivae are normal. PERRL. EOMI. Head: Atraumatic. Nose: No congestion/rhinnorhea. Mouth/Throat: Mucous membranes are moist.  Oropharynx  non-erythematous. Neck: No stridor.   Nontender with no meningismus Cardiovascular: Normal rate, regular rhythm. Grossly normal heart sounds.  Good peripheral circulation. Respiratory: Normal respiratory effort.  No retractions.  Lungs CTAB. Abdominal: Soft and nontender. No distention. No guarding no rebound Back:  There is no focal tenderness or step off there is no midline tenderness there are no lesions noted. there is no CVA tenderness Musculoskeletal: No lower extremity tenderness. No joint effusions, no DVT signs strong distal pulses no edema Neurologic:  Normal speech and language. No gross focal neurologic deficits are appreciated.  Skin:  Skin is warm, dry and intact. No rash noted. Psychiatric: Mood and affect are normal. Speech and behavior are normal.  ____________________________________________   LABS (all labs ordered are listed, but only abnormal results are displayed)  Labs Reviewed  COMPREHENSIVE METABOLIC PANEL  CBC WITH DIFFERENTIAL/PLATELET  URINE DRUG SCREEN, QUALITATIVE ([facility] ONLY)  URINALYSIS COMPLETEWITH MICROSCOPIC ([facility] ONLY)  POC URINE PREG, ED   ____________________________________________  EKG  I personally interpreted any EKGs ordered by me or triage  ____________________________________________  RADIOLOGY  I reviewed any imaging ordered by me or triage that were performed during my shift ____________________________________________   PROCEDURES  Procedure(s) performed: None  Critical Care performed: None  ____________________________________________   INITIAL IMPRESSION / ASSESSMENT AND PLAN / ED COURSE  Pertinent labs & imaging results that were available during my care of the patient were reviewed by me and considered in my medical decision making (see chart for details).  Patient with Imitrex with nausea and vomiting with a nonsurgical abdomen. We will treat her nausea and vomiting give her IV fluids Zestril electrolytes and  reassess. We do note that blood work was obtained prior to patient leaving the waiting room to take a nap in her car. Her potassium is 2.7 but the rest of her blood work is reassuring. We will give her repletion of potassium intravenously.   ----------------------------------------- 9:12 AM on 09/07/2015 -----------------------------------------   after Haldol patient is no longer nauseated she is lying comfortably in the bed abdomen remains benign and she is much less dramatic in her presentation. She is requesting "something for pain"  patient however was sound asleep when I entered the room. We will defer pain medication for now although I suspect she will not want to go home unless she gets some. It is somewhat difficult however to wake someone up from a sound slumber to give pain medication. I do note from review of her [facility] shows that the patient had a prescription for hydromorphone 2 mg tablets on October 5, tramadol on 9/26, Percocet on 9/16, Percocet on 9/12, hydromorphone on 9/9, oxycodone on August 2.  This is a large quantity of narcotic pain medications and raises concern. ____________________________________________  ----------------------------------------- 10:10 AM on 09/07/2015 -----------------------------------------  Patient has woken up and has been on the call bell numerous time demanding IV narcotics. I will not give her IV narcotics at this time I do not see any indication for. I have told her I will give her a dose of Percocet here. Patient is worried she might throw that up although this but no actual vomiting since she got here. We'll give her a dose of Phenergan at her request. Verlon AuWe're still 10 replace her potassium. She is yet to give us a urine sample. We have encouraged her to do so. Patient remains in no acute distress. Serial abdominal exams remain benign if I can replace her potassium will try to get her  home.   ----------------------------------------- 12:37 PM on 09/07/2015 -----------------------------------------  [place] and is much better, patient feels much better, no vomiting here for 5 hours, I am reassured by  her findings. Upon discharge patient did request a pelvic exam. She states that she had a vaginal discharge a few weeks ago and is worried perhaps that could contribute to her symptoms. She has no evidence of PID on external exam but we will do a pelvic exam to verify no evidence of infection   Pelvic exam: Female nurse chaperone present, no external lesions noted, physiologic vaginal discharge noted with no purulent discharge, no cervical motion tenderness, no adnexal tenderness or mass, there is no significant uterine tenderness or mass. No vaginal bleeding  FINAL CLINICAL IMPRESSION(S) / ED DIAGNOSES  Final diagnoses:  None     Jeanmarie Plant, MD 09/07/15 1610  Jeanmarie Plant, MD 09/07/15 9604  Jeanmarie Plant, MD 09/07/15 5409  Jeanmarie Plant, MD 09/07/15 8119  Jeanmarie Plant, MD 09/07/15 1011  Jeanmarie Plant, MD 09/07/15 1238  Jeanmarie Plant, MD 09/07/15 1311

## 2015-09-07 NOTE — ED Notes (Signed)
Lab called to report a potassium of 2.7 after patient LWBS. Per EDP may call patient at home and notify them of low potassium.

## 2015-09-07 NOTE — ED Notes (Signed)
Patient ambulatory to triage with steady gait, without difficulty or distress noted; pt reports that she has been in her car sleeping; was removed from waiting room list earlier due to not being found in lobby or outside when called to treatment room

## 2015-09-07 NOTE — ED Notes (Signed)
Attempt to make contact with patient via telephone unsuccessful.

## 2015-09-07 NOTE — Discharge Instructions (Signed)

## 2015-09-07 NOTE — ED Notes (Signed)
Patient transported back to room from X-ray 

## 2015-09-07 NOTE — ED Notes (Addendum)
Patient ambulatory to triage with steady gait, without difficulty or distress noted; pt c/o mid abd pain with N/V; has been seen multiple times for same; pt reports endoscopy performed with normal findings

## 2015-09-08 ENCOUNTER — Encounter: Payer: Self-pay | Admitting: Gastroenterology

## 2015-09-08 ENCOUNTER — Emergency Department
Admission: EM | Admit: 2015-09-08 | Discharge: 2015-09-08 | Disposition: A | Discharge status: Home / Self Care | Payer: Medicaid Other | Attending: Emergency Medicine | Admitting: Emergency Medicine

## 2015-09-08 DIAGNOSIS — Z79899 Other long term (current) drug therapy: Secondary | ICD-10-CM | POA: Diagnosis not present

## 2015-09-08 DIAGNOSIS — Z720 Tobacco use: Secondary | ICD-10-CM | POA: Diagnosis not present

## 2015-09-08 DIAGNOSIS — R109 Unspecified abdominal pain: Secondary | ICD-10-CM | POA: Diagnosis present

## 2015-09-08 DIAGNOSIS — R1110 Vomiting, unspecified: Secondary | ICD-10-CM | POA: Insufficient documentation

## 2015-09-08 MED ORDER — HALOPERIDOL LACTATE 5 MG/ML IJ SOLN
5.0000 mg | Freq: Once | INTRAMUSCULAR | Status: AC
  Administered 2015-09-08: 5 mg via INTRAMUSCULAR
  Filled 2015-09-08: qty 1

## 2015-09-08 MED ORDER — LORAZEPAM 2 MG/ML IJ SOLN
1.0000 mg | Freq: Once | INTRAMUSCULAR | Status: AC
  Administered 2015-09-08: 1 mg via INTRAMUSCULAR
  Filled 2015-09-08: qty 1

## 2015-09-08 NOTE — Discharge Instructions (Signed)
Call your therapist for follow up.   See your GI doctor.   Return to ER if you have worse pain, vomiting, fevers.

## 2015-09-08 NOTE — ED Notes (Signed)
Pt presents with abd pain, n/v been going on since September. Pt just here yesterday for same. States she is not any better.

## 2015-09-08 NOTE — ED Provider Notes (Signed)
CSN: [id number]     Arrival date & time 09/08/15  0815 History   First MD Initiated Contact with Patient 09/08/15 1054     Chief Complaint  Patient presents with  . Abdominal Pain     (Consider location/radiation/quality/duration/timing/severity/associated sxs/prior Treatment) The history is provided by the patient.  Sharyon Peitz is a 39 y.o. female history of reflux, anxiety here presenting with abdominal pain, vomiting. Patient has frequent ED visits and had an extensive workup over the last 2 months. Patient initially was told she had H. pylori but finished antibiotics but still has pain. This multiple ED visits for the same problem. Came here yesterday and frequently asked for narcotics. Has not filled her prescription outpatient. She was suspected to have drug-seeking behavior. She has multiple prescriptions of narcotics over the last month or so. She also had lab work done yesterday that was at baseline. She has been vomiting since her visit yesterday.   Past Medical History  Diagnosis Date  . Sleep apnea   . GERD (gastroesophageal reflux disease)   . Hyperlipidemia   . Depression   . Anxiety   . PTSD (post-traumatic stress disorder)   . Obesity   . Anemia   . Vitamin D deficiency   . Nontoxic uninodular goiter   . Multiple gastric ulcers   . H pylori ulcer    Past Surgical History  Procedure Laterality Date  . Cesarean section    . Esophagogastroduodenoscopy (egd) with propofol Left 07/07/2015    Procedure: ESOPHAGOGASTRODUODENOSCOPY (EGD) WITH PROPOFOL;  Surgeon: Scot Jun, MD;  Location[facility] ENDOSCOPY;  Service: Endoscopy;  Laterality: Left;  . Cesarean section    . Esophagogastroduodenoscopy (egd) with propofol N/A 09/05/2015    Procedure: ESOPHAGOGASTRODUODENOSCOPY (EGD) WITH PROPOFOL;  Surgeon: Midge Minium, MD;  Location: [facility] ENDOSCOPY;  Service: Endoscopy;  Laterality: N/A;   Family History  Problem Relation Age of Onset  . Lung cancer Father   .  Cancer Father     lung cancer  . Alcohol abuse Father   . Lung cancer Paternal Aunt   . Cancer Paternal Aunt     lung  . Bone cancer Paternal Uncle   . Cancer Paternal Uncle     pancreatic  . Depression Mother   . Anxiety disorder Mother   . Arthritis Mother   . Hyperlipidemia Mother   . Hypertension Maternal Grandmother   . Diabetes Paternal Grandmother   . Heart disease Paternal Grandmother   . Hypertension Paternal Grandmother   . Asthma Son   . Vision loss Neg Hx   . COPD Neg Hx    Social History  Substance Use Topics  . Smoking status: Current Some Day Smoker -- 0.50 packs/day for 17 years    Types: Cigarettes  . Smokeless tobacco: Never Used  . Alcohol Use: Yes     Comment: occasional   OB History    Gravida Para Term Preterm AB TAB SAB Ectopic Multiple Living   0 0 0 0 0 0 0 0       Review of Systems  Gastrointestinal: Positive for vomiting and abdominal pain.  All other systems reviewed and are negative.     Allergies  Review of patient's allergies indicates no known allergies.  Home Medications   Prior to Admission medications   Medication Sig Start Date End Date Taking? Authorizing Provider  alprazolam Prudy Feeler) 2 MG tablet Take 2 mg by mouth 2 (two) times daily as needed for anxiety.  Yes Historical Provider, MD  citalopram (CELEXA) 40 MG tablet Take 40 mg by mouth at bedtime.   Yes Historical Provider, MD  metroNIDAZOLE (FLAGYL) 500 MG tablet Take 1 tablet (500 mg total) by mouth 2 (two) times daily. 09/07/15 09/21/15 Yes Jeanmarie Plant, MD  ondansetron (ZOFRAN ODT) 4 MG disintegrating tablet Take 1 tablet (4 mg total) by mouth every 8 (eight) hours as needed for nausea or vomiting. 09/03/15  Yes Darci Current, MD  potassium chloride (K-DUR) 10 MEQ tablet Take 2 tablets (20 mEq total) by mouth daily. 09/07/15  Yes Jeanmarie Plant, MD  promethazine (PHENERGAN) 12.5 MG tablet Take 1 tablet (12.5 mg total) by mouth every 6 (six) hours as needed for  nausea or vomiting. 09/07/15  Yes Jeanmarie Plant, MD  QUEtiapine (SEROQUEL) 50 MG tablet Take 100 mg by mouth at bedtime.   Yes Historical Provider, MD  sucralfate (CARAFATE) 1 G tablet Take 1 g by mouth 4 (four) times daily -  with meals and at bedtime.   Yes Historical Provider, MD  zolpidem (AMBIEN) 10 MG tablet Take 10 mg by mouth at bedtime.   Yes Historical Provider, MD   BP 171/102 mmHg  Pulse 84  Temp(Src) 99 F (37.2 C) (Oral)  Resp 20  Ht  (1.6 m)  Wt 170 lb (77.111 kg)  BMI 30.12 kg/m2  SpO2 95%  LMP 09/03/2015 Physical Exam  Constitutional: She is oriented to person, place, and time.  Tearful, anxious   HENT:  Head: Normocephalic.  MM slightly dry  Eyes: Conjunctivae are normal. Pupils are equal, round, and reactive to light.  Neck: Normal range of motion.  Cardiovascular: Normal rate, regular rhythm and normal heart sounds.   Pulmonary/Chest: Effort normal and breath sounds normal. No respiratory distress. She has no wheezes.  Abdominal: Soft. Bowel sounds are normal. She exhibits no distension. There is no tenderness.  Musculoskeletal: Normal range of motion.  Neurological: She is alert and oriented to person, place, and time.  Skin: Skin is warm and dry.  Psychiatric: She has a normal mood and affect. Her behavior is normal. Judgment and thought content normal.  Nursing note and vitals reviewed.   ED Course  Procedures (including critical care time) Labs Review Labs Reviewed - No data to display  Imaging Review Dg Chest 2 View  09/07/2015  CLINICAL DATA:  Pain, primarily upper abdomen, following endoscopy 2 days prior EXAM: CHEST  2 VIEW COMPARISON:  August 03, 2015 FINDINGS: Lungs are clear. Heart size and pulmonary vascularity are normal. No adenopathy. No bone lesions. No pneumothorax or pneumomediastinum. No pneumoperitoneum is appreciable on this study. IMPRESSION: No abnormality noted. Electronically Signed   By: Bretta Bang III M.D.   On:  09/07/2015 07:18   I have personally reviewed and evaluated these images and lab results as part of my medical decision-making.   EKG Interpretation None      MDM   Final diagnoses:  None   Ayliana Casciano is a 39 y.o. female here with vomiting, ab pain. Patient had extensive workup including multiple CTs, labs, EGD. I think likely gastroparesis vs anxiety. She is hypertensive but has been hypertensive in the past. Had labs yesterday. Will not need to repeat today. I don't think she has a medical emergency. She likely has chronic pain and anxiety and vomiting. Will give ativan and haldol and reassess.   1:18 PM Patient comfortably sleeping now. Wakes up and requests pain meds. I told her  that we don't treat chronic pain. She also hasn't been seeing her therapist. Recommend that she see her therapist and fill her meds outpatient.     Richardean Canalavid H Yao, MD 09/08/15 [phone]

## 2015-09-08 NOTE — Care Management Note (Signed)
Case Management Note  Patient Details  Name: Laurie Frederick Buehrle MRN: [id number] Date of Birth: [birth year]  Subjective/Objective:  I spoke to the patient after talking with Dr Silverio LayYao. She has been provided with a copy of her results for tests done on 09/05/15. She had expressed to me that she was told they took specimens, and she thought the results would show something treatable. The report indeed states there were no specimens taken . The test results show normal anatomy only. She acted confused and axious , raising up in the bed. I have explained to her that she has nothing to treat at this time, and enquired when she last went to a therapist for her anxiety, as this seems to be a cycle.She feels nauseated and becomes anxious she is going to vomit, and she has agreed. I offered to reach out to her therapist, but she has declined, stating she has no problem contacting them, although on further questioning, she has not seen them for about 2 months. I have encouraged her to do so ASAP, and made Dr Silverio LayYao aware of the talk.                  Action/Plan:   Expected Discharge Date:                  Expected Discharge Plan:     In-House Referral:     Discharge planning Services     Post Acute Care Choice:    Choice offered to:     DME Arranged:    DME Agency:     HH Arranged:    HH Agency:     Status of Service:     Medicare Important Message Given:    Date Medicare IM Given:    Medicare IM give by:    Date Additional Medicare IM Given:    Additional Medicare Important Message give by:     If discussed at Long Length of Stay Meetings, dates discussed:    Additional Comments:  Berna BueCheryl Krishana Lutze, RN 09/08/2015, 12:33 PM

## 2015-09-08 NOTE — ED Notes (Signed)
Pt discharged home after verbalizing understanding of discharge instructions; nad noted. 

## 2015-09-08 NOTE — ED Notes (Signed)
Pt reports that she has had abdominal pain, nausea, vomiting since September. She was seen here yesterday, has followed up with her pcp and gastroenterologist and has an MRI scheduled for next week. Pt states she is not feeling any better.

## 2015-09-16 ENCOUNTER — Emergency Department
Admission: EM | Admit: 2015-09-16 | Discharge: 2015-09-16 | Disposition: A | Discharge status: Home / Self Care | Payer: Medicaid Other | Attending: Emergency Medicine | Admitting: Emergency Medicine

## 2015-09-16 ENCOUNTER — Encounter: Payer: Self-pay | Admitting: Emergency Medicine

## 2015-09-16 DIAGNOSIS — R112 Nausea with vomiting, unspecified: Secondary | ICD-10-CM | POA: Diagnosis not present

## 2015-09-16 DIAGNOSIS — F17210 Nicotine dependence, cigarettes, uncomplicated: Secondary | ICD-10-CM | POA: Insufficient documentation

## 2015-09-16 DIAGNOSIS — Z3202 Encounter for pregnancy test, result negative: Secondary | ICD-10-CM | POA: Insufficient documentation

## 2015-09-16 DIAGNOSIS — G8929 Other chronic pain: Secondary | ICD-10-CM | POA: Diagnosis not present

## 2015-09-16 DIAGNOSIS — R109 Unspecified abdominal pain: Secondary | ICD-10-CM | POA: Diagnosis present

## 2015-09-16 DIAGNOSIS — Z79899 Other long term (current) drug therapy: Secondary | ICD-10-CM | POA: Diagnosis not present

## 2015-09-16 DIAGNOSIS — R1013 Epigastric pain: Secondary | ICD-10-CM | POA: Insufficient documentation

## 2015-09-16 LAB — CBC
HEMATOCRIT: 38.9 % (ref 35.0–47.0)
Hemoglobin: 12.7 g/dL (ref 12.0–16.0)
MCH: 28.8 pg (ref 26.0–34.0)
MCHC: 32.6 g/dL (ref 32.0–36.0)
MCV: 88.4 fL (ref 80.0–100.0)
PLATELETS: 218 10*3/uL (ref 150–440)
RBC: 4.4 MIL/uL (ref 3.80–5.20)
RDW: 14.3 % (ref 11.5–14.5)
WBC: 7.1 10*3/uL (ref 3.6–11.0)

## 2015-09-16 LAB — URINALYSIS COMPLETE WITH MICROSCOPIC (ARMC ONLY)
BILIRUBIN URINE: NEGATIVE
Glucose, UA: NEGATIVE mg/dL
Hgb urine dipstick: NEGATIVE
Ketones, ur: NEGATIVE mg/dL
Nitrite: NEGATIVE
PH: 8 (ref 5.0–8.0)
Protein, ur: 30 mg/dL — AB
Specific Gravity, Urine: 1.015 (ref 1.005–1.030)

## 2015-09-16 LAB — COMPREHENSIVE METABOLIC PANEL
ALBUMIN: 4.2 g/dL (ref 3.5–5.0)
ALT: 15 U/L (ref 14–54)
AST: 19 U/L (ref 15–41)
Alkaline Phosphatase: 73 U/L (ref 38–126)
Anion gap: 8 (ref 5–15)
BUN: 6 mg/dL (ref 6–20)
CHLORIDE: 105 mmol/L (ref 101–111)
CO2: 30 mmol/L (ref 22–32)
CREATININE: 0.67 mg/dL (ref 0.44–1.00)
Calcium: 9.5 mg/dL (ref 8.9–10.3)
GFR calc Af Amer: >60 mL/min (ref >60)
GFR calc non Af Amer: >60 mL/min (ref >60)
GLUCOSE: 133 mg/dL — AB (ref 65–99)
POTASSIUM: 3.1 mmol/L — AB (ref 3.5–5.1)
SODIUM: 143 mmol/L (ref 135–145)
Total Bilirubin: 0.6 mg/dL (ref 0.3–1.2)
Total Protein: 7.7 g/dL (ref 6.5–8.1)

## 2015-09-16 LAB — POCT PREGNANCY, URINE: Preg Test, Ur: NEGATIVE

## 2015-09-16 LAB — LIPASE, BLOOD: LIPASE: 34 U/L (ref 11–51)

## 2015-09-16 MED ORDER — MORPHINE SULFATE (PF) 4 MG/ML IV SOLN
4.0000 mg | Freq: Once | INTRAVENOUS | Status: AC
  Administered 2015-09-16: 4 mg via INTRAVENOUS
  Filled 2015-09-16: qty 1

## 2015-09-16 MED ORDER — MORPHINE SULFATE (PF) 4 MG/ML IV SOLN: 4.0000 mg | Freq: Once | INTRAVENOUS | Status: DC

## 2015-09-16 MED ORDER — POTASSIUM CHLORIDE CRYS ER 20 MEQ PO TBCR
40.0000 meq | EXTENDED_RELEASE_TABLET | Freq: Once | ORAL | Status: DC
  Filled 2015-09-16: qty 2

## 2015-09-16 MED ORDER — SODIUM CHLORIDE 0.9 % IV BOLUS (SEPSIS)
1000.0000 mL | Freq: Once | INTRAVENOUS | Status: AC
  Administered 2015-09-16: 1000 mL via INTRAVENOUS

## 2015-09-16 MED ORDER — HALOPERIDOL LACTATE 5 MG/ML IJ SOLN
5.0000 mg | Freq: Once | INTRAMUSCULAR | Status: AC
  Administered 2015-09-16: 5 mg via INTRAVENOUS
  Filled 2015-09-16: qty 1

## 2015-09-16 MED ORDER — POTASSIUM CHLORIDE 20 MEQ PO PACK
PACK | ORAL | Status: AC
  Administered 2015-09-16: 40 meq
  Filled 2015-09-16: qty 2

## 2015-09-16 MED ORDER — GI COCKTAIL ~~LOC~~
30.0000 mL | Freq: Once | ORAL | Status: AC
  Administered 2015-09-16: 30 mL via ORAL
  Filled 2015-09-16: qty 30

## 2015-09-16 NOTE — ED Notes (Signed)
Pt states she has a ride coming for discharge, MD aware

## 2015-09-16 NOTE — ED Provider Notes (Addendum)
[facility] Emergency Department Provider Note  ____________________________________________   I have reviewed the triage vital signs and the nursing notes.   HISTORY  Chief Complaint Emesis and Abdominal Pain    HPI Laurie Frederick is a 39 y.o. female who has chronic abdominal pain now since September. She states that every time she sees a doctor they put her on narcotics when the narcotics run out her pain comes back and she is worried she is weren't drawn from the narcotics. She's had pain and vomiting for last 2 days but really she's had pain and vomiting since September. She's had multiple different admissions for this she states. Given his several different hospitals and she is scheduled for an outpatient MRI tomorrow. She would like pain medication, she has not been taking her Carafate, or her potassium supplementation. She denies fever or chills. She has no hematemesis or melena or bright red blood per rectum. She has epigastric pain which is a stable she's had since September. She did have a negative CT scan here and ultrasound. Old work was done by triage and is reassuring.  Past Medical History  Diagnosis Date  . Sleep apnea   . GERD (gastroesophageal reflux disease)   . Hyperlipidemia   . Depression   . Anxiety   . PTSD (post-traumatic stress disorder)   . Obesity   . Anemia   . Vitamin D deficiency   . Nontoxic uninodular goiter   . Multiple gastric ulcers   . H pylori ulcer     Patient Active Problem List   Diagnosis Date Noted  . Abdominal pain, epigastric   . Gastritis   . Hypokalemia 08/16/2015  . Chronic prescription benzodiazepine use 08/16/2015  . Intractable nausea and vomiting 07/22/2015  . Epigastric pain   . Helicobacter positive gastritis 07/16/2015  . Gastritis and gastroduodenitis 07/16/2015  . Hyperlipidemia 07/13/2015  . Vitamin D deficiency 07/13/2015  . Thyroid nodule 07/13/2015  . Polyp of gallbladder 07/13/2015   . Liver nodule 07/13/2015  . Nausea & vomiting 07/13/2015  . Gastroenteritis 07/06/2015  . 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 Alcohol abuse 04/01/2015    Past Surgical History  Procedure Laterality Date  . Cesarean section    . Esophagogastroduodenoscopy (egd) with propofol Left 07/07/2015    Procedure: ESOPHAGOGASTRODUODENOSCOPY (EGD) WITH PROPOFOL;  Surgeon: Scot Jun, MD;  Location: [facility] ENDOSCOPY;  Service: Endoscopy;  Laterality: Left;  . Cesarean section    . Esophagogastroduodenoscopy (egd) with propofol N/A 09/05/2015    Procedure: ESOPHAGOGASTRODUODENOSCOPY (EGD) WITH PROPOFOL;  Surgeon: Midge Minium, MD;  Location: [facility] ENDOSCOPY;  Service: Endoscopy;  Laterality: N/A;    Current Outpatient Rx  Name  Route  Sig  Dispense  Refill  . alprazolam (XANAX) 2 MG tablet   Oral   Take 2 mg by mouth 2 (two) times daily as needed for anxiety.          . citalopram (CELEXA) 40 MG tablet   Oral   Take 40 mg by mouth at bedtime.         . ondansetron (ZOFRAN ODT) 4 MG disintegrating tablet   Oral   Take 1 tablet (4 mg total) by mouth every 8 (eight) hours as needed for nausea or vomiting.   20 tablet   0   . QUEtiapine (SEROQUEL) 50 MG tablet   Oral   Take 100 mg by mouth at bedtime.         Marland Kitchen zolpidem (AMBIEN) 10 MG  tablet   Oral   Take 10 mg by mouth at bedtime.         . metroNIDAZOLE (FLAGYL) 500 MG tablet   Oral   Take 1 tablet (500 mg total) by mouth 2 (two) times daily. Patient not taking: Reported on 09/16/2015   14 tablet   0   . potassium chloride (K-DUR) 10 MEQ tablet   Oral   Take 2 tablets (20 mEq total) by mouth daily. Patient not taking: Reported on 09/16/2015   10 tablet   0   . promethazine (PHENERGAN) 12.5 MG tablet   Oral   Take 1 tablet (12.5 mg total) by mouth every 6 (six) hours as needed for nausea or vomiting. Patient not taking: Reported on 09/16/2015   15 tablet   0     Allergies Review of patient's  allergies indicates no known allergies.  Family History  Problem Relation Age of Onset  . Lung cancer Father   . Cancer Father     lung cancer  . Alcohol abuse Father   . Lung cancer Paternal Aunt   . Cancer Paternal Aunt     lung  . Bone cancer Paternal Uncle   . Cancer Paternal Uncle     pancreatic  . Depression Mother   . Anxiety disorder Mother   . Arthritis Mother   . Hyperlipidemia Mother   . Hypertension Maternal Grandmother   . Diabetes Paternal Grandmother   . Heart disease Paternal Grandmother   . Hypertension Paternal Grandmother   . Asthma Son   . Vision loss Neg Hx   . COPD Neg Hx     Social History Social History  Substance Use Topics  . Smoking status: Current Some Day Smoker -- 0.50 packs/day for 17 years    Types: Cigarettes  . Smokeless tobacco: Never Used  . Alcohol Use: Yes     Comment: occasional    Review of Systems Constitutional: No fever/chills Eyes: No visual changes. ENT: No sore throat. No stiff neck no neck pain Cardiovascular: Denies chest pain. Respiratory: Denies shortness of breath. Gastrointestinal:   Positive vomiting.  No diarrhea.  No constipation. Genitourinary: Negative for dysuria. Musculoskeletal: Negative lower extremity swelling Skin: Negative for rash. Neurological: Negative for headaches, focal weakness or numbness. 10-point ROS otherwise negative.  ____________________________________________   PHYSICAL EXAM:  VITAL SIGNS: ED Triage Vitals  Enc Vitals Group     BP 09/16/15 0952 161/99 mmHg     Pulse Rate 09/16/15 0952 98     Resp 09/16/15 0952 18     Temp 09/16/15 0952 98.4 F (36.9 C)     Temp Source 09/16/15 0952 Oral     SpO2 09/16/15 0952 100 %     Weight 09/16/15 0952 171 lb (77.565 kg)     Height 09/16/15 0952 5\' 2"  (1.575 m)     Head Cir --      Peak Flow --      Pain Score 09/16/15 0957 10     Pain Loc --      Pain Edu? --      Excl. in GC? --     Constitutional: Alert and oriented. Well  appearing and in no acute distress. Medically however she is crying. Speaks in a whisper Eyes: Conjunctivae are normal. PERRL. EOMI. Head: Atraumatic. Nose: No congestion/rhinnorhea. Mouth/Throat: Mucous membranes are moist.  Oropharynx non-erythematous. Neck: No stridor.   Nontender with no meningismus Cardiovascular: Normal rate, regular rhythm. Grossly normal heart sounds.  Good peripheral circulation. Respiratory: Normal respiratory effort.  No retractions. Lungs CTAB. Abdominal: Only tender in the epigastric region. No distention. No guarding no rebound Back:  There is no focal tenderness or step off there is no midline tenderness there are no lesions noted. there is no CVA tenderness Musculoskeletal: No lower extremity tenderness. No joint effusions, no DVT signs strong distal pulses no edema Neurologic:  Normal speech and language. No gross focal neurologic deficits are appreciated.  Skin:  Skin is warm, dry and intact. No rash noted. Psychiatric: Mood and affect are normal. Speech and behavior normal.  ____________________________________________   LABS (all labs ordered are listed, but only abnormal results are displayed)  Labs Reviewed  COMPREHENSIVE METABOLIC PANEL - Abnormal; Notable for the following:    Potassium 3.1 (*)    Glucose, Bld 133 (*)    All other components within normal limits  URINALYSIS COMPLETEWITH MICROSCOPIC ([facility] ONLY) - Abnormal; Notable for the following:    Color, Urine YELLOW (*)    APPearance HAZY (*)    Protein, ur 30 (*)    Leukocytes, UA 1+ (*)    Bacteria, UA RARE (*)    Squamous Epithelial / LPF 6-30 (*)    All other components within normal limits  LIPASE, BLOOD  CBC  POC URINE PREG, ED  POCT PREGNANCY, URINE   ____________________________________________  EKG  I personally interpreted any EKGs ordered by me or triage  ____________________________________________  RADIOLOGY  I reviewed any imaging ordered by me or triage that  were performed during my shift ____________________________________________   PROCEDURES  Procedure(s) performed: None  Critical Care performed: None  ____________________________________________   INITIAL IMPRESSION / ASSESSMENT AND PLAN / ED COURSE  Pertinent labs & imaging results that were available during my care of the patient were reviewed by me and considered in my medical decision making (see chart for details).  Patient with chronic daily epigastric abdominal pain which has been present for months. She has been here multiple different times for this presentation. She feels that she is in some degree of narcotic withdrawal and I do not think is unlikely. However she is demanding narcotics because she states she has this abdominal pain and nothing else will make better. This obviously presents something of a interventional dilemma, as I do not wish to make the problem worse by recurrent narcotic admission. They give her Haldol which has gotten rid of her nausea but she is now demanding "something else" for her chronic abdominal pain.   ----------------------------------------- 2:09 PM on 09/16/2015 -----------------------------------------  Patient now demanding Dilaudid by name. She states that is the only thing that makes her feel better. I told her I will give her a single dose of morphine here but I will not give her Dilaudid and I will discharge her after that as there is no obvious pathology noted here and patient is agreeable to this plan serial abdominal exams are reassuring. There is no evidence of acute intra-abdominal pathology gallbladder disease, appendicitis, perforated viscus or other acute illness. There is a very benign exam on chronic abdominal pain with Dilaudid drug-seeking behavior. At this time, there does not appear to be clinical evidence to support the diagnosis of pulmonary embolus, dissection, myocarditis, endocarditis, pericarditis, pericardial tamponade,  acute coronary syndrome, pneumothorax, pneumonia, or any other acute intrathoracic pathology that will require admission or acute intervention. Nor is there evidence of any significant intra-abdominal pathology causing this discomfort. However patient understands she must return if she feels worse.  ____________________________________________   FINAL CLINICAL IMPRESSION(S) / ED DIAGNOSES  Final diagnoses:  None     Jeanmarie PlantJames A Butch Otterson, MD 09/16/15 1248  Jeanmarie PlantJames A Ramona Slinger, MD 09/16/15 1409  Jeanmarie PlantJames A Donzell Coller, MD 09/16/15 [phone]

## 2015-09-16 NOTE — Discharge Instructions (Signed)

## 2015-09-16 NOTE — ED Notes (Signed)
Pt to ed with c/o abd pain and vomiting since September.  Pt states she has appt for MRI in the am, but feels she needs to be seen sooner.

## 2015-09-17 ENCOUNTER — Encounter: Payer: Self-pay | Admitting: Emergency Medicine

## 2015-09-17 ENCOUNTER — Ambulatory Visit
Admission: RE | Admit: 2015-09-17 | Discharge: 2015-09-17 | Disposition: A | Discharge status: Home / Self Care | Payer: Medicaid Other | Source: Ambulatory Visit | Attending: Physician Assistant | Admitting: Physician Assistant

## 2015-09-17 ENCOUNTER — Emergency Department
Admission: EM | Admit: 2015-09-17 | Discharge: 2015-09-17 | Disposition: A | Discharge status: Home / Self Care | Payer: Medicaid Other | Attending: Emergency Medicine | Admitting: Emergency Medicine

## 2015-09-17 DIAGNOSIS — R932 Abnormal findings on diagnostic imaging of liver and biliary tract: Secondary | ICD-10-CM

## 2015-09-17 DIAGNOSIS — F17210 Nicotine dependence, cigarettes, uncomplicated: Secondary | ICD-10-CM | POA: Insufficient documentation

## 2015-09-17 DIAGNOSIS — K769 Liver disease, unspecified: Secondary | ICD-10-CM | POA: Insufficient documentation

## 2015-09-17 DIAGNOSIS — Z79899 Other long term (current) drug therapy: Secondary | ICD-10-CM | POA: Insufficient documentation

## 2015-09-17 DIAGNOSIS — R1110 Vomiting, unspecified: Secondary | ICD-10-CM | POA: Diagnosis not present

## 2015-09-17 DIAGNOSIS — R1013 Epigastric pain: Secondary | ICD-10-CM | POA: Diagnosis not present

## 2015-09-17 DIAGNOSIS — G8929 Other chronic pain: Secondary | ICD-10-CM | POA: Diagnosis not present

## 2015-09-17 DIAGNOSIS — R1010 Upper abdominal pain, unspecified: Secondary | ICD-10-CM | POA: Diagnosis present

## 2015-09-17 DIAGNOSIS — R109 Unspecified abdominal pain: Secondary | ICD-10-CM

## 2015-09-17 MED ORDER — ALUM & MAG HYDROXIDE-SIMETH 200-200-20 MG/5ML PO SUSP
30.0000 mL | Freq: Once | ORAL | Status: AC
  Administered 2015-09-17: 30 mL via ORAL
  Filled 2015-09-17: qty 30

## 2015-09-17 MED ORDER — ACETAMINOPHEN 500 MG PO TABS
1000.0000 mg | ORAL_TABLET | ORAL | Status: AC
  Administered 2015-09-17: 1000 mg via ORAL
  Filled 2015-09-17: qty 2

## 2015-09-17 MED ORDER — GADOXETATE DISODIUM 0.25 MMOL/ML IV SOLN
8.0000 mL | Freq: Once | INTRAVENOUS | Status: AC | PRN
  Administered 2015-09-17: 8 mL via INTRAVENOUS

## 2015-09-17 MED ORDER — ONDANSETRON 4 MG PO TBDP
4.0000 mg | ORAL_TABLET | Freq: Once | ORAL | Status: AC
  Administered 2015-09-17: 4 mg via ORAL
  Filled 2015-09-17: qty 1

## 2015-09-17 MED ORDER — FAMOTIDINE 20 MG PO TABS
40.0000 mg | ORAL_TABLET | Freq: Once | ORAL | Status: AC
  Administered 2015-09-17: 40 mg via ORAL
  Filled 2015-09-17: qty 2

## 2015-09-17 NOTE — Discharge Instructions (Signed)
Please follow-up for your MRI today as scheduled. Return to the ER right away if you develop fever, worsening symptoms, you pass out, your vomiting blood, you noticed blood in her stool, you have chest pain, or other new concerns arise.  Abdominal Pain, Adult Many things can cause abdominal pain. Usually, abdominal pain is not caused by a disease and will improve without treatment. It can often be observed and treated at home. Your health care provider will do a physical exam and possibly order blood tests and X-rays to help determine the seriousness of your pain. However, in many cases, more time must pass before a clear cause of the pain can be found. Before that point, your health care provider may not know if you need more testing or further treatment. HOME CARE INSTRUCTIONS Monitor your abdominal pain for any changes. The following actions may help to alleviate any discomfort you are experiencing:  Only take over-the-counter or prescription medicines as directed by your health care provider.  Do not take laxatives unless directed to do so by your health care provider.  Try a clear liquid diet (broth, tea, or water) as directed by your health care provider. Slowly move to a bland diet as tolerated. SEEK MEDICAL CARE IF:  You have unexplained abdominal pain.  You have abdominal pain associated with nausea or diarrhea.  You have pain when you urinate or have a bowel movement.  You experience abdominal pain that wakes you in the night.  You have abdominal pain that is worsened or improved by eating food.  You have abdominal pain that is worsened with eating fatty foods.  You have a fever. SEEK IMMEDIATE MEDICAL CARE IF:  Your pain does not go away within 2 hours.  You keep throwing up (vomiting).  Your pain is felt only in portions of the abdomen, such as the right side or the left lower portion of the abdomen.  You pass bloody or black tarry stools. MAKE SURE YOU:  Understand  these instructions.  Will watch your condition.  Will get help right away if you are not doing well or get worse.   This information is not intended to replace advice given to you by your health care provider. Make sure you discuss any questions you have with your health care provider.   Document Released: 07/26/2005 Document Revised: 07/07/2015 Document Reviewed: 06/25/2013 Elsevier Interactive Patient Education [employer]2016 Elsevier Inc.

## 2015-09-17 NOTE — ED Notes (Addendum)
MD at bedside, pt reports she has been throwing up all night and c/o of things jumping around in epigastric area. Reports unable to keep anything down.  No vomiting since she has been in the room.

## 2015-09-17 NOTE — ED Provider Notes (Signed)
[facility] Emergency Department Provider Note REMINDER - THIS NOTE IS NOT A FINAL MEDICAL RECORD UNTIL IT IS SIGNED. UNTIL THEN, THE CONTENT BELOW MAY REFLECT INFORMATION FROM A DOCUMENTATION TEMPLATE, NOT THE ACTUAL PATIENT VISIT. ____________________________________________  Time seen: Approximately 7:31 AM  I have reviewed the triage vital signs and the nursing notes.   HISTORY  Chief Complaint Abdominal Pain and Emesis    HPI Laurie Frederick is a 39 y.o. female who reports that she's had months of ongoing upper abdominal pain. She reports that she's been having ongoing pain in the upper abdomen, it is a "drawing" type of discomfort. It is crampy, and moderate to severe in intensity. She reports this pain is constant almost every day, and that she is in pain every day. She reports that occasionally she vomits.  She denies any lower abdominal pain, fevers or chills. No chest pain or trouble breathing.  She also reports she has an MRI scheduled for 9:30 this morning of her abdomen, as ordered by her gastroenterologist. She's had recent CT scans as well as ultrasound, and been to the ER many times but has not been given a clear diagnosis for her pain.   Past Medical History  Diagnosis Date  . Sleep apnea   . GERD (gastroesophageal reflux disease)   . Hyperlipidemia   . Depression   . Anxiety   . PTSD (post-traumatic stress disorder)   . Obesity   . Anemia   . Vitamin D deficiency   . Nontoxic uninodular goiter   . Multiple gastric ulcers   . H pylori ulcer     Patient Active Problem List   Diagnosis Date Noted  . Abdominal pain, epigastric   . Gastritis   . Hypokalemia 08/16/2015  . Chronic prescription benzodiazepine use 08/16/2015  . Intractable nausea and vomiting 07/22/2015  . Epigastric pain   . Helicobacter positive gastritis 07/16/2015  . Gastritis and gastroduodenitis 07/16/2015  . Hyperlipidemia 07/13/2015  . Vitamin D  deficiency 07/13/2015  . Thyroid nodule 07/13/2015  . Polyp of gallbladder 07/13/2015  . Liver nodule 07/13/2015  . Nausea & vomiting 07/13/2015  . Gastroenteritis 07/06/2015  . 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 Alcohol abuse 04/01/2015    Past Surgical History  Procedure Laterality Date  . Cesarean section    . Esophagogastroduodenoscopy (egd) with propofol Left 07/07/2015    Procedure: ESOPHAGOGASTRODUODENOSCOPY (EGD) WITH PROPOFOL;  Surgeon: Scot Jun, MD;  Location: [facility] ENDOSCOPY;  Service: Endoscopy;  Laterality: Left;  . Cesarean section    . Esophagogastroduodenoscopy (egd) with propofol N/A 09/05/2015    Procedure: ESOPHAGOGASTRODUODENOSCOPY (EGD) WITH PROPOFOL;  Surgeon: Midge Minium, MD;  Location: [facility] ENDOSCOPY;  Service: Endoscopy;  Laterality: N/A;    Current Outpatient Rx  Name  Route  Sig  Dispense  Refill  . alprazolam (XANAX) 2 MG tablet   Oral   Take 2 mg by mouth 2 (two) times daily as needed for anxiety.          . citalopram (CELEXA) 40 MG tablet   Oral   Take 40 mg by mouth at bedtime.         Marland Kitchen QUEtiapine (SEROQUEL) 50 MG tablet   Oral   Take 100 mg by mouth at bedtime.         Marland Kitchen zolpidem (AMBIEN) 10 MG tablet   Oral   Take 10 mg by mouth at bedtime.         . metroNIDAZOLE (FLAGYL) 500  MG tablet   Oral   Take 1 tablet (500 mg total) by mouth 2 (two) times daily. Patient not taking: Reported on 09/16/2015   14 tablet   0   . ondansetron (ZOFRAN ODT) 4 MG disintegrating tablet   Oral   Take 1 tablet (4 mg total) by mouth every 8 (eight) hours as needed for nausea or vomiting. Patient not taking: Reported on 09/17/2015   20 tablet   0   . potassium chloride (K-DUR) 10 MEQ tablet   Oral   Take 2 tablets (20 mEq total) by mouth daily. Patient not taking: Reported on 09/16/2015   10 tablet   0   . promethazine (PHENERGAN) 12.5 MG tablet   Oral   Take 1 tablet (12.5 mg total) by mouth every 6 (six) hours as needed  for nausea or vomiting. Patient not taking: Reported on 09/16/2015   15 tablet   0     Allergies Review of patient's allergies indicates no known allergies.  Family History  Problem Relation Age of Onset  . Lung cancer Father   . Cancer Father     lung cancer  . Alcohol abuse Father   . Lung cancer Paternal Aunt   . Cancer Paternal Aunt     lung  . Bone cancer Paternal Uncle   . Cancer Paternal Uncle     pancreatic  . Depression Mother   . Anxiety disorder Mother   . Arthritis Mother   . Hyperlipidemia Mother   . Hypertension Maternal Grandmother   . Diabetes Paternal Grandmother   . Heart disease Paternal Grandmother   . Hypertension Paternal Grandmother   . Asthma Son   . Vision loss Neg Hx   . COPD Neg Hx     Social History Social History  Substance Use Topics  . Smoking status: Current Some Day Smoker -- 0.50 packs/day for 17 years    Types: Cigarettes  . Smokeless tobacco: Never Used  . Alcohol Use: Yes     Comment: occasional    Review of Systems Constitutional: No fever/chills Eyes: No visual changes. ENT: No sore throat. Cardiovascular: Denies chest pain. Respiratory: Denies shortness of breath. Gastrointestinal: See history of present illness No diarrhea.  No constipation. Genitourinary: Negative for dysuria. Musculoskeletal: Negative for back pain. Skin: Negative forrash. Neurological: Negative for headaches, focal weakness or numbness.  10-point ROS otherwise negative.  ____________________________________________   PHYSICAL EXAM:  VITAL SIGNS: ED Triage Vitals  Enc Vitals Group     BP 09/17/15 0639 171/110 mmHg     Pulse Rate 09/17/15 0639 100     Resp 09/17/15 0639 18     Temp 09/17/15 0639 98.1 F (36.7 C)     Temp Source 09/17/15 0639 Oral     SpO2 09/17/15 0639 97 %     Weight 09/17/15 0639 171 lb (77.565 kg)     Height 09/17/15 0639 5\' 2"  (1.575 m)     Head Cir --      Peak Flow --      Pain Score 09/17/15 0640 10      Pain Loc --      Pain Edu? --      Excl. in GC? --    Constitutional: Alert and oriented. Well appearing and in no acute distress. Eyes: Conjunctivae are normal. PERRL. EOMI. Head: Atraumatic. Nose: No congestion/rhinnorhea. Mouth/Throat: Mucous membranes are moist.  Oropharynx non-erythematous. Neck: No stridor.   Cardiovascular: Normal rate, regular rhythm. Grossly normal heart sounds.  Good peripheral circulation. Respiratory: Normal respiratory effort.  No retractions. Lungs CTAB. Gastrointestinal: Soft and nontender to for some mild discomfort in the epigastrium without rebound or guarding. No distention. No abdominal bruits. No CVA tenderness. Musculoskeletal: No lower extremity tenderness nor edema.  No joint effusions. Neurologic:  Normal speech and language. No gross focal neurologic deficits are appreciated. No gait instability. Skin:  Skin is warm, dry and intact. No rash noted. Psychiatric: Mood and affect are normal. Speech and behavior are normal.  ____________________________________________   LABS (all labs ordered are listed, but only abnormal results are displayed)  Labs Reviewed - No data to display ____________________________________________  EKG  ED ECG REPORT I, QUALE, MARK, the attending physician, personally viewed and interpreted this ECG.  Date: 09/17/2015 EKG Time: 742 Rate: 86 Rhythm: normal sinus rhythm QRS Axis: normal Intervals: normal ST/T Wave abnormalities: normal Conduction Disutrbances: Borderline LVH Narrative Interpretation: Normal sinus rhythm, question LVH, no evidence of ischemic changes, no prolonged QT  ____________________________________________  RADIOLOGY  Patient has had a recent ultrasound as well as CT scan less than 3 months ago. Her symptoms appear to be the same, and she has an order for an MRI outpatient today facilitated through her  clinician. ____________________________________________   PROCEDURES  Procedure(s) performed: None  Critical Care performed: No  ____________________________________________   INITIAL IMPRESSION / ASSESSMENT AND PLAN / ED COURSE  Pertinent labs & imaging results that were available during my care of the patient were reviewed by me and considered in my medical decision making (see chart for details).  The patient presents for ongoing and chronic upper abdominal pain. She is rather insistent that she needs to receive strong pain medications including "Dilaudid". Her exam is quite stable, she demonstrates no signs or symptoms of distress. I find nothing to suggest acute intra-abdominal process, she is afebrile. She had labs done yesterday essentially unremarkable including a negative pregnancy test. Appears that her symptoms have not changed significantly, and she does not appear to be dehydrated. She is fully awake and alert and becomes quite upset at myself when we discussed not treating her pain with any narcotics today, but she is willing to try oral pain medication including Tylenol area  This point, we will attempt to control her symptoms with oral medications and if she is able to take fluids by mouth, I think should she would be more than appropriate to continue her outpatient workup with her MRI this morning as planned.  Per Dr. Teddy Spike note:   "Of note, she once again appeared to be over-medicated during our visit. Words were slurred. She was irrational.  She does report that she is unable to keep anything down and has been this way since she left the hospital at the end of Sept. Her weight on our last visit (10/06) was 170. Her weight today was 170."  Patient's weight today is 171. Appears that she is taking in by mouth, certainly no signs or symptoms of dehydration today. The patient again continues to ask me to be treated with oxycodone at this time he 10 AM. I discussed with her  that I did not feel comfortable prescribing her narcotic. I did however advised her strongly that she will be discharged and that she should go eat her MRI done this morning, and she is agreeable with this. I instructed her that if she has continuing problems with pain she should contact her primary care physician if she truly believes that she needs controlled substances, as I'm  not comfortable prescribing them for her  from the ER.  Patient was able to take oral medications, she is also able to drink a cup of water. She reports mild improvement in her symptoms, at this time discussed close plan of care and we will discharge her so that she can obtain her MRI as this continues to be worked up outpatient. She is hemodynamically stable, no distress, no surgical signs on abdominal exam and she is had what seems to be very extensive workup thus far, with next step being MRI as ordered by her gastroenterologist, which she has setup in about one hour.  Careful return precautions advised including fever, worsening of her pain, weakness, developing any chest pain or trouble breathing, black or bloody stools, or other new concerns arise she will return to the ER. ____________________________________________   FINAL CLINICAL IMPRESSION(S) / ED DIAGNOSES  Final diagnoses:  Chronic abdominal pain      Sharyn Creamer, MD 09/17/15 [phone]

## 2015-09-17 NOTE — ED Notes (Signed)
Pt in room, recently up to the bathroom, no reports of vomiting at this time.

## 2015-09-17 NOTE — ED Notes (Signed)
Pt verbalized understanding of discharge instructions. NAD at this time. 

## 2015-09-17 NOTE — ED Notes (Signed)
Pt arrived to the ED for complaints of abdominal pain and vomiting x2 days. Pt reports that she was seen for the same 1 day ago but has not improved. Pt reports that she has been having abdominal problems for quite some time and is scheduled for and MRI today. Pt is AOx4 in mild pain distress.

## 2015-09-18 ENCOUNTER — Emergency Department: Payer: Medicaid Other

## 2015-09-18 ENCOUNTER — Encounter: Payer: Self-pay | Admitting: Emergency Medicine

## 2015-09-18 ENCOUNTER — Observation Stay
Admission: EM | Admit: 2015-09-18 | Discharge: 2015-09-19 | Disposition: A | Discharge status: Home / Self Care | Payer: Medicaid Other | Attending: Internal Medicine | Admitting: Internal Medicine

## 2015-09-18 DIAGNOSIS — E669 Obesity, unspecified: Secondary | ICD-10-CM | POA: Diagnosis not present

## 2015-09-18 DIAGNOSIS — D649 Anemia, unspecified: Secondary | ICD-10-CM | POA: Diagnosis not present

## 2015-09-18 DIAGNOSIS — I517 Cardiomegaly: Secondary | ICD-10-CM | POA: Diagnosis not present

## 2015-09-18 DIAGNOSIS — Z801 Family history of malignant neoplasm of trachea, bronchus and lung: Secondary | ICD-10-CM | POA: Insufficient documentation

## 2015-09-18 DIAGNOSIS — R1010 Upper abdominal pain, unspecified: Secondary | ICD-10-CM | POA: Diagnosis present

## 2015-09-18 DIAGNOSIS — Z8711 Personal history of peptic ulcer disease: Secondary | ICD-10-CM | POA: Insufficient documentation

## 2015-09-18 DIAGNOSIS — R109 Unspecified abdominal pain: Secondary | ICD-10-CM

## 2015-09-18 DIAGNOSIS — R000 Tachycardia, unspecified: Secondary | ICD-10-CM | POA: Diagnosis not present

## 2015-09-18 DIAGNOSIS — N920 Excessive and frequent menstruation with regular cycle: Secondary | ICD-10-CM | POA: Insufficient documentation

## 2015-09-18 DIAGNOSIS — Z975 Presence of (intrauterine) contraceptive device: Secondary | ICD-10-CM | POA: Insufficient documentation

## 2015-09-18 DIAGNOSIS — Z79899 Other long term (current) drug therapy: Secondary | ICD-10-CM | POA: Diagnosis not present

## 2015-09-18 DIAGNOSIS — K824 Cholesterolosis of gallbladder: Secondary | ICD-10-CM | POA: Insufficient documentation

## 2015-09-18 DIAGNOSIS — Z818 Family history of other mental and behavioral disorders: Secondary | ICD-10-CM | POA: Insufficient documentation

## 2015-09-18 DIAGNOSIS — G8929 Other chronic pain: Secondary | ICD-10-CM | POA: Diagnosis not present

## 2015-09-18 DIAGNOSIS — E041 Nontoxic single thyroid nodule: Secondary | ICD-10-CM | POA: Insufficient documentation

## 2015-09-18 DIAGNOSIS — N939 Abnormal uterine and vaginal bleeding, unspecified: Secondary | ICD-10-CM | POA: Diagnosis not present

## 2015-09-18 DIAGNOSIS — K760 Fatty (change of) liver, not elsewhere classified: Secondary | ICD-10-CM | POA: Diagnosis not present

## 2015-09-18 DIAGNOSIS — K219 Gastro-esophageal reflux disease without esophagitis: Secondary | ICD-10-CM | POA: Insufficient documentation

## 2015-09-18 DIAGNOSIS — K7689 Other specified diseases of liver: Secondary | ICD-10-CM | POA: Diagnosis not present

## 2015-09-18 DIAGNOSIS — K224 Dyskinesia of esophagus: Secondary | ICD-10-CM | POA: Insufficient documentation

## 2015-09-18 DIAGNOSIS — F321 Major depressive disorder, single episode, moderate: Secondary | ICD-10-CM | POA: Insufficient documentation

## 2015-09-18 DIAGNOSIS — Z6828 Body mass index (BMI) 28.0-28.9, adult: Secondary | ICD-10-CM | POA: Insufficient documentation

## 2015-09-18 DIAGNOSIS — Z800 Family history of malignant neoplasm of digestive organs: Secondary | ICD-10-CM | POA: Diagnosis not present

## 2015-09-18 DIAGNOSIS — F4310 Post-traumatic stress disorder, unspecified: Secondary | ICD-10-CM | POA: Insufficient documentation

## 2015-09-18 DIAGNOSIS — R112 Nausea with vomiting, unspecified: Secondary | ICD-10-CM | POA: Diagnosis not present

## 2015-09-18 DIAGNOSIS — R079 Chest pain, unspecified: Secondary | ICD-10-CM | POA: Insufficient documentation

## 2015-09-18 DIAGNOSIS — K769 Liver disease, unspecified: Secondary | ICD-10-CM | POA: Diagnosis not present

## 2015-09-18 DIAGNOSIS — Z8249 Family history of ischemic heart disease and other diseases of the circulatory system: Secondary | ICD-10-CM | POA: Insufficient documentation

## 2015-09-18 DIAGNOSIS — F17210 Nicotine dependence, cigarettes, uncomplicated: Secondary | ICD-10-CM | POA: Diagnosis not present

## 2015-09-18 DIAGNOSIS — K529 Noninfective gastroenteritis and colitis, unspecified: Secondary | ICD-10-CM | POA: Diagnosis not present

## 2015-09-18 DIAGNOSIS — R1013 Epigastric pain: Secondary | ICD-10-CM | POA: Insufficient documentation

## 2015-09-18 DIAGNOSIS — E785 Hyperlipidemia, unspecified: Secondary | ICD-10-CM | POA: Diagnosis not present

## 2015-09-18 DIAGNOSIS — R102 Pelvic and perineal pain: Secondary | ICD-10-CM | POA: Diagnosis not present

## 2015-09-18 DIAGNOSIS — R1110 Vomiting, unspecified: Secondary | ICD-10-CM

## 2015-09-18 DIAGNOSIS — N921 Excessive and frequent menstruation with irregular cycle: Secondary | ICD-10-CM

## 2015-09-18 DIAGNOSIS — Z825 Family history of asthma and other chronic lower respiratory diseases: Secondary | ICD-10-CM | POA: Insufficient documentation

## 2015-09-18 DIAGNOSIS — R1011 Right upper quadrant pain: Secondary | ICD-10-CM | POA: Insufficient documentation

## 2015-09-18 DIAGNOSIS — K2990 Gastroduodenitis, unspecified, without bleeding: Secondary | ICD-10-CM | POA: Insufficient documentation

## 2015-09-18 DIAGNOSIS — E559 Vitamin D deficiency, unspecified: Secondary | ICD-10-CM | POA: Insufficient documentation

## 2015-09-18 DIAGNOSIS — Z833 Family history of diabetes mellitus: Secondary | ICD-10-CM | POA: Insufficient documentation

## 2015-09-18 DIAGNOSIS — Z811 Family history of alcohol abuse and dependence: Secondary | ICD-10-CM | POA: Diagnosis not present

## 2015-09-18 DIAGNOSIS — N946 Dysmenorrhea, unspecified: Secondary | ICD-10-CM | POA: Diagnosis not present

## 2015-09-18 DIAGNOSIS — F1010 Alcohol abuse, uncomplicated: Secondary | ICD-10-CM | POA: Insufficient documentation

## 2015-09-18 DIAGNOSIS — F419 Anxiety disorder, unspecified: Secondary | ICD-10-CM | POA: Insufficient documentation

## 2015-09-18 DIAGNOSIS — G4730 Sleep apnea, unspecified: Secondary | ICD-10-CM | POA: Diagnosis not present

## 2015-09-18 DIAGNOSIS — Z808 Family history of malignant neoplasm of other organs or systems: Secondary | ICD-10-CM | POA: Insufficient documentation

## 2015-09-18 DIAGNOSIS — E876 Hypokalemia: Secondary | ICD-10-CM | POA: Diagnosis not present

## 2015-09-18 LAB — CBC
HCT: 39.3 % (ref 35.0–47.0)
Hemoglobin: 12.8 g/dL (ref 12.0–16.0)
MCH: 28.6 pg (ref 26.0–34.0)
MCHC: 32.7 g/dL (ref 32.0–36.0)
MCV: 87.4 fL (ref 80.0–100.0)
Platelets: 245 10*3/uL (ref 150–440)
RBC: 4.49 MIL/uL (ref 3.80–5.20)
RDW: 14.5 % (ref 11.5–14.5)
WBC: 9.4 10*3/uL (ref 3.6–11.0)

## 2015-09-18 LAB — URINALYSIS COMPLETE WITH MICROSCOPIC (ARMC ONLY)
BILIRUBIN URINE: NEGATIVE
Bacteria, UA: NONE SEEN
Glucose, UA: NEGATIVE mg/dL
Leukocytes, UA: NEGATIVE
NITRITE: NEGATIVE
PROTEIN: 100 mg/dL — AB
SPECIFIC GRAVITY, URINE: 1.032 — AB (ref 1.005–1.030)
pH: 6 (ref 5.0–8.0)

## 2015-09-18 LAB — COMPREHENSIVE METABOLIC PANEL
ALBUMIN: 4.5 g/dL (ref 3.5–5.0)
ALT: 14 U/L (ref 14–54)
ANION GAP: 9 (ref 5–15)
AST: 12 U/L — ABNORMAL LOW (ref 15–41)
Alkaline Phosphatase: 67 U/L (ref 38–126)
BILIRUBIN TOTAL: 0.7 mg/dL (ref 0.3–1.2)
BUN: 14 mg/dL (ref 6–20)
CO2: 28 mmol/L (ref 22–32)
Calcium: 9.5 mg/dL (ref 8.9–10.3)
Chloride: 101 mmol/L (ref 101–111)
Creatinine, Ser: 0.59 mg/dL (ref 0.44–1.00)
GFR calc non Af Amer: >60 mL/min (ref >60)
GLUCOSE: 106 mg/dL — AB (ref 65–99)
POTASSIUM: 2.9 mmol/L — AB (ref 3.5–5.1)
SODIUM: 138 mmol/L (ref 135–145)
TOTAL PROTEIN: 7.8 g/dL (ref 6.5–8.1)

## 2015-09-18 LAB — POCT PREGNANCY, URINE: Preg Test, Ur: NEGATIVE

## 2015-09-18 LAB — LIPASE, BLOOD: LIPASE: 29 U/L (ref 11–51)

## 2015-09-18 LAB — TROPONIN I: Troponin I: <0.03 ng/mL (ref <0.031)

## 2015-09-18 MED ORDER — PROMETHAZINE HCL 25 MG/ML IJ SOLN
25.0000 mg | Freq: Once | INTRAMUSCULAR | Status: AC
  Administered 2015-09-18: 25 mg via INTRAVENOUS
  Filled 2015-09-18: qty 1

## 2015-09-18 MED ORDER — SODIUM CHLORIDE 0.9 % IV BOLUS (SEPSIS)
1000.0000 mL | Freq: Once | INTRAVENOUS | Status: AC
  Administered 2015-09-18: 1000 mL via INTRAVENOUS

## 2015-09-18 MED ORDER — MORPHINE SULFATE (PF) 4 MG/ML IV SOLN: 4.0000 mg | Freq: Once | INTRAVENOUS | Status: DC

## 2015-09-18 MED ORDER — ONDANSETRON HCL 4 MG/2ML IJ SOLN
4.0000 mg | Freq: Once | INTRAMUSCULAR | Status: AC
Start: 2015-09-18 — End: 2015-09-18
  Administered 2015-09-18: 4 mg via INTRAVENOUS
  Filled 2015-09-18: qty 2

## 2015-09-18 MED ORDER — KETOROLAC TROMETHAMINE 30 MG/ML IJ SOLN
30.0000 mg | Freq: Once | INTRAMUSCULAR | Status: AC
  Administered 2015-09-18: 30 mg via INTRAVENOUS
  Filled 2015-09-18: qty 1

## 2015-09-18 MED ORDER — GI COCKTAIL ~~LOC~~
30.0000 mL | Freq: Once | ORAL | Status: AC
  Administered 2015-09-18: 30 mL via ORAL
  Filled 2015-09-18: qty 30

## 2015-09-18 NOTE — ED Notes (Addendum)
Dr. Paduchowski at bedside.  

## 2015-09-18 NOTE — H&P (Signed)
Laurie Frederick is an 39 y.o. female.    Chief Complaint: Abdominal pain HPI: Patient presents to the emergency department again for the third day in a row complaining of abdominal pain. This is a chronic complaint that the patient has been having for at least 3 months. She has seen multiple specialists and had multiple endoscopies as well as imaging to find the source of her pain. He admits that she was diagnosed with H. pylori at one point but did not complete the clarithromycin portion of her triple therapy. He admits to some nausea that will not go away. She states her abdominal pain is so bad that she cannot sleep and that she demands some relief. She also states that she's had some vaginal bleeding that is different than her period. She is concerned about her Peri-Guard IUD. Due to intractable nausea emergency department staff called for admission.  Past Medical History  Diagnosis Date  . Sleep apnea   . GERD (gastroesophageal reflux disease)   . Hyperlipidemia   . Depression   . Anxiety   . PTSD (post-traumatic stress disorder)   . Obesity   . Anemia   . Vitamin D deficiency   . Nontoxic uninodular goiter   . Multiple gastric ulcers   . H pylori ulcer     Past Surgical History  Procedure Laterality Date  . Cesarean section    . Esophagogastroduodenoscopy (egd) with propofol Left 07/07/2015    Procedure: ESOPHAGOGASTRODUODENOSCOPY (EGD) WITH PROPOFOL;  Surgeon: Manya Silvas, MD;  Location: [place];  Service: Endoscopy;  Laterality: Left;  . Cesarean section    . Esophagogastroduodenoscopy (egd) with propofol N/A 09/05/2015    Procedure: ESOPHAGOGASTRODUODENOSCOPY (EGD) WITH PROPOFOL;  Surgeon: Lucilla Lame, MD;  Location: [facility] ENDOSCOPY;  Service: Endoscopy;  Laterality: N/A;    Family History  Problem Relation Age of Onset  . Lung cancer Father   . Cancer Father     lung cancer  . Alcohol abuse Father   . Lung cancer Paternal Aunt   . Cancer Paternal Aunt      lung  . Bone cancer Paternal Uncle   . Cancer Paternal Uncle     pancreatic  . Depression Mother   . Anxiety disorder Mother   . Arthritis Mother   . Hyperlipidemia Mother   . Hypertension Maternal Grandmother   . Diabetes Paternal Grandmother   . Heart disease Paternal Grandmother   . Hypertension Paternal Grandmother   . Asthma Son   . Vision loss Neg Hx   . COPD Neg Hx    Social History:  reports that she has been smoking Cigarettes.  She has a 4.25 pack-year smoking history. She has never used smokeless tobacco. She reports that she drinks alcohol. She reports that she does not use illicit drugs.  Allergies: No Known Allergies  Prior to Admission medications   Medication Sig Start Date End Date Taking? Authorizing Provider  alprazolam Duanne Moron) 2 MG tablet Take 2 mg by mouth 2 (two) times daily as needed for anxiety.     Historical Provider, MD  citalopram (CELEXA) 40 MG tablet Take 40 mg by mouth at bedtime.    Historical Provider, MD  metroNIDAZOLE (FLAGYL) 500 MG tablet Take 1 tablet (500 mg total) by mouth 2 (two) times daily. Patient not taking: Reported on 09/16/2015 09/07/15 09/21/15  Schuyler Amor, MD  ondansetron (ZOFRAN ODT) 4 MG disintegrating tablet Take 1 tablet (4 mg total) by mouth every 8 (eight) hours as needed  for nausea or vomiting. Patient not taking: Reported on 09/17/2015 09/03/15   Gregor Hams, MD  potassium chloride (K-DUR) 10 MEQ tablet Take 2 tablets (20 mEq total) by mouth daily. Patient not taking: Reported on 09/16/2015 09/07/15   Schuyler Amor, MD  promethazine (PHENERGAN) 12.5 MG tablet Take 1 tablet (12.5 mg total) by mouth every 6 (six) hours as needed for nausea or vomiting. Patient not taking: Reported on 09/16/2015 09/07/15   Schuyler Amor, MD  QUEtiapine (SEROQUEL) 50 MG tablet Take 100 mg by mouth at bedtime.    Historical Provider, MD  zolpidem (AMBIEN) 10 MG tablet Take 10 mg by mouth at bedtime.    Historical Provider, MD      Results for orders placed or performed during the hospital encounter of 09/18/15 (from the past 48 hour(s))  CBC     Status: None   Collection Time: 09/18/15  9:04 PM  Result Value Ref Range   WBC 9.4 3.6 - 11.0 K/uL   RBC 4.49 3.80 - 5.20 MIL/uL   Hemoglobin 12.8 12.0 - 16.0 g/dL   HCT 39.3 35.0 - 47.0 %   MCV 87.4 80.0 - 100.0 fL   MCH 28.6 26.0 - 34.0 pg   MCHC 32.7 32.0 - 36.0 g/dL   RDW 14.5 11.5 - 14.5 %   Platelets 245 150 - 440 K/uL  Comprehensive metabolic panel     Status: Abnormal   Collection Time: 09/18/15  9:04 PM  Result Value Ref Range   Sodium 138 135 - 145 mmol/L   Potassium 2.9 (LL) 3.5 - 5.1 mmol/L    Comment: CRITICAL RESULT CALLED TO, READ BACK BY AND VERIFIED WITH  RACHEL HAYDEN AT 2139 09/18/15 SDR    Chloride 101 101 - 111 mmol/L   CO2 28 22 - 32 mmol/L   Glucose, Bld 106 (H) 65 - 99 mg/dL   BUN 14 6 - 20 mg/dL   Creatinine, Ser 0.59 0.44 - 1.00 mg/dL   Calcium 9.5 8.9 - 10.3 mg/dL   Total Protein 7.8 6.5 - 8.1 g/dL   Albumin 4.5 3.5 - 5.0 g/dL   AST 12 (L) 15 - 41 U/L   ALT 14 14 - 54 U/L   Alkaline Phosphatase 67 38 - 126 U/L   Total Bilirubin 0.7 0.3 - 1.2 mg/dL   GFR calc non Af Amer >60 >60 mL/min   GFR calc Af Amer >60 >60 mL/min    Comment: (NOTE) The eGFR has been calculated using the CKD EPI equation. This calculation has not been validated in all clinical situations. eGFR's persistently <60 mL/min signify possible Chronic Kidney Disease.    Anion gap 9 5 - 15  Troponin I     Status: None   Collection Time: 09/18/15  9:04 PM  Result Value Ref Range   Troponin I <0.03 <0.031 ng/mL    Comment:        NO INDICATION OF MYOCARDIAL INJURY.   Lipase, blood     Status: None   Collection Time: 09/18/15  9:04 PM  Result Value Ref Range   Lipase 29 11 - 51 U/L  Urinalysis complete, with microscopic ([facility] only)     Status: Abnormal   Collection Time: 09/18/15  9:04 PM  Result Value Ref Range   Color, Urine AMBER (A) YELLOW    APPearance HAZY (A) CLEAR   Glucose, UA NEGATIVE NEGATIVE mg/dL   Bilirubin Urine NEGATIVE NEGATIVE   Ketones, ur 2+ (A) NEGATIVE mg/dL  Specific Gravity, Urine 1.032 (H) 1.005 - 1.030   Hgb urine dipstick 3+ (A) NEGATIVE   pH 6.0 5.0 - 8.0   Protein, ur 100 (A) NEGATIVE mg/dL   Nitrite NEGATIVE NEGATIVE   Leukocytes, UA NEGATIVE NEGATIVE   RBC / HPF 0-5 0 - 5 RBC/hpf   WBC, UA 6-30 0 - 5 WBC/hpf   Bacteria, UA NONE SEEN NONE SEEN   Squamous Epithelial / LPF 6-30 (A) NONE SEEN   Mucous PRESENT   Pregnancy, urine POC     Status: None   Collection Time: 09/18/15  9:26 PM  Result Value Ref Range   Preg Test, Ur NEGATIVE NEGATIVE    Comment:        THE SENSITIVITY OF THIS METHODOLOGY IS >24 mIU/mL    Mr Abdomen W Wo Contrast  09/17/2015  CLINICAL DATA:  Subsequent encounter for 2.4 cm right hepatic lobe lesion. EXAM: MRI ABDOMEN WITHOUT AND WITH CONTRAST TECHNIQUE: Multiplanar multisequence MR imaging of the abdomen was performed both before and after the administration of intravenous contrast. CONTRAST:  8 cc Eovist COMPARISON:  Ultrasound exam from 09/03/2015. CT scan from 07/05/2015. MRI from 02/04/2014. FINDINGS: Lower chest:  Unremarkable. Hepatobiliary: 7 mm subcapsular lesion in the inferior right liver (image 18 series 4) unchanged in the interval. 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 Mild diffuse fatty deposition within the liver is again noted. Previously described hypervascular lesion in the inferior right liver again is identified and is most evident on arterial phase imaging (see image 34 series 11). 1.9 cm maximum diameter today is stable compared to 2.1 cm previously. This lesion is essentially isointense to liver parenchyma on T2 weighted imaging although there may be some subtle central hyperintensity. Subtle hypo intensity is seen within the lesion on T1 weighted precontrast imaging. 20 minutes delayed hepatocytes phase imaging shows retention  of contrast within the peripheral aspect of the lesion. There is no evidence for gallstones, gallbladder wall thickening, or pericholecystic fluid. No intrahepatic or extrahepatic biliary dilation. Pancreas: No focal mass lesion. No dilatation of the main duct. No intraparenchymal cyst. No peripancreatic edema. Spleen: No splenomegaly. No focal mass lesion. Adrenals/Urinary Tract: No adrenal nodule or mass. Kidneys are unremarkable. Stomach/Bowel: Stomach is nondistended. No gastric wall thickening. No evidence of outlet obstruction. Duodenum is normally positioned as is the ligament of Treitz. Vascular/Lymphatic: No abdominal aortic aneurysm. There is no gastrohepatic or hepatoduodenal ligament lymphadenopathy. No intraperitoneal or retroperitoneal lymphadenopy. Other: No intraperitoneal free fluid. Musculoskeletal: No abnormal marrow signal within the visualized bony anatomy. IMPRESSION: 1. Dominant inferior right liver lesion is stable over the 19 months since prior MRI. Interval stability is most consistent with a benign process. On today's study, there does appear to be retention of contrast peripherally in the lesion on delayed hepatocyte phase imaging. Together, imaging features remain most suggestive of focal nodular hyperplasia. 2. Otherwise stable exam.  No new or progressive findings. Electronically Signed   By: Misty Stanley M.D.   On: 09/17/2015 11:44   Dg Chest Portable 1 View  09/18/2015  CLINICAL DATA:  Chest pain EXAM: PORTABLE CHEST 1 VIEW COMPARISON:  09/07/2015 chest radiograph. FINDINGS: Stable cardiomediastinal silhouette with normal heart size. No pneumothorax. No pleural effusion. Clear lungs, with no focal lung consolidation and no pulmonary edema. IMPRESSION: No active disease. Electronically Signed   By: Ilona Sorrel M.D.   On: 09/18/2015 21:03    Review of Systems  Constitutional: Negative for fever and chills.  HENT: Negative  for sore throat and tinnitus.   Eyes: Negative for  blurred vision and redness.  Respiratory: Negative for cough and shortness of breath.   Cardiovascular: Negative for chest pain, palpitations, orthopnea and PND.  Gastrointestinal: Positive for nausea and abdominal pain. Negative for vomiting and diarrhea.  Genitourinary: Negative for dysuria, urgency and frequency.  Musculoskeletal: Negative for myalgias and joint pain.  Skin: Negative for rash.       No lesions  Neurological: Negative for speech change, focal weakness and weakness.  Endo/Heme/Allergies: Does not bruise/bleed easily.       No temperature intolerance  Psychiatric/Behavioral: Negative for depression and suicidal ideas.    Blood pressure 146/102, pulse 106, temperature 98.9 F (37.2 C), temperature source Oral, resp. rate 13, height 5' 3"  (1.6 m), weight 77.565 kg (171 lb), last menstrual period 09/03/2015, SpO2 99 %. Physical Exam  Nursing note and vitals reviewed. Constitutional: She is oriented to person, place, and time. She appears well-developed and well-nourished. No distress.  HENT:  Head: Normocephalic and atraumatic.  Mouth/Throat: Oropharynx is clear and moist.  Eyes: Conjunctivae and EOM are normal. Pupils are equal, round, and reactive to light. No scleral icterus.  Neck: Normal range of motion. Neck supple. No JVD present. No tracheal deviation present. No thyromegaly present.  Cardiovascular: Normal rate, regular rhythm and normal heart sounds.  Exam reveals no gallop and no friction rub.   No murmur heard. Respiratory: Effort normal and breath sounds normal.  GI: Soft. Bowel sounds are normal. She exhibits no distension and no mass. There is tenderness. There is no rebound and no guarding.  Genitourinary:  Deferred  Musculoskeletal: Normal range of motion. She exhibits no edema.  Lymphadenopathy:    She has no cervical adenopathy.  Neurological: She is alert and oriented to person, place, and time. No cranial nerve deficit. She exhibits normal muscle  tone.  Skin: Skin is warm and dry. No rash noted. No erythema.  Psychiatric: She has a normal mood and affect. Her behavior is normal. Judgment and thought content normal.     Assessment/Plan This is a 39 year old African American female admitted for intractable nausea and vomiting. 1. Intractable nausea and vomiting: Nonbloody nonbilious; multiple evaluations by gastroenterology. The patient reports the finding of gastritis although her most recent study is reported as normal. Zofran IV and Compazine suppositories for symptomatic relief. I also ordered a scopolamine patch. 2. Vaginal bleeding: Painless. OB/GYN consult placed 3. Depression: Continue Xanax, Celexa and Seroquel. Likely contributing to chronic pain. Psych consult at discretion of the primary team. 4. DVT prophylaxis: Heparin 5. GI prophylaxis: Pantoprazole The patient is a full code. Time spent on admission was inpatient care approximately 45 minutes  Harrie Foreman 09/18/2015, 11:49 PM

## 2015-09-18 NOTE — ED Provider Notes (Addendum)
[facility] Emergency Department Provider Note  Time seen: 8:51 PM  I have reviewed the triage vital signs and the nursing notes.   HISTORY  Chief Complaint Chest Pain and Vaginal Bleeding    HPI Laurie Frederick is a 39 y.o. female with a past medical history of gastric reflux, hyperlipidemia, depression, anxiety, PTSD, upper abdominal pain, gastritis who presents the emergency department with continued upper abdominal pain. According to the patient for the past several months she has had upper abdominal pain which is been worse over the past few weeks. The patient is currently being seen by Dr. Val Eagle of GI medicine for a workup. She states the workup as shown gastritis but otherwise has been normal. This is now the patient's fifth emergency department visit to our facility this month for the same complaint. The patient admits to also going to [facility] emergency department. Denies vomiting, does state some nausea. Denies fever, diarrhea, black or bloody stool. Patient states intermittent vaginal spotting, she has paraguard birth control. Describes the pain currently as 10/10, upper abdominal pain right and left-sided.     Past Medical History  Diagnosis Date  . Sleep apnea   . GERD (gastroesophageal reflux disease)   . Hyperlipidemia   . Depression   . Anxiety   . PTSD (post-traumatic stress disorder)   . Obesity   . Anemia   . Vitamin D deficiency   . Nontoxic uninodular goiter   . Multiple gastric ulcers   . H pylori ulcer     Patient Active Problem List   Diagnosis Date Noted  . Abdominal pain, epigastric   . Gastritis   . Hypokalemia 08/16/2015  . Chronic prescription benzodiazepine use 08/16/2015  . Intractable nausea and vomiting 07/22/2015  . Epigastric pain   . Helicobacter positive gastritis 07/16/2015  . Gastritis and gastroduodenitis 07/16/2015  . Hyperlipidemia 07/13/2015  . Vitamin D deficiency 07/13/2015  . Thyroid nodule 07/13/2015  .  Polyp of gallbladder 07/13/2015  . Liver nodule 07/13/2015  . Nausea & vomiting 07/13/2015  . Gastroenteritis 07/06/2015  . 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 Alcohol abuse 04/01/2015    Past Surgical History  Procedure Laterality Date  . Cesarean section    . Esophagogastroduodenoscopy (egd) with propofol Left 07/07/2015    Procedure: ESOPHAGOGASTRODUODENOSCOPY (EGD) WITH PROPOFOL;  Surgeon: Scot Jun, MD;  Location: [facility] ENDOSCOPY;  Service: Endoscopy;  Laterality: Left;  . Cesarean section    . Esophagogastroduodenoscopy (egd) with propofol N/A 09/05/2015    Procedure: ESOPHAGOGASTRODUODENOSCOPY (EGD) WITH PROPOFOL;  Surgeon: Midge Minium, MD;  Location: [facility] ENDOSCOPY;  Service: Endoscopy;  Laterality: N/A;    Current Outpatient Rx  Name  Route  Sig  Dispense  Refill  . alprazolam (XANAX) 2 MG tablet   Oral   Take 2 mg by mouth 2 (two) times daily as needed for anxiety.          . citalopram (CELEXA) 40 MG tablet   Oral   Take 40 mg by mouth at bedtime.         . metroNIDAZOLE (FLAGYL) 500 MG tablet   Oral   Take 1 tablet (500 mg total) by mouth 2 (two) times daily. Patient not taking: Reported on 09/16/2015   14 tablet   0   . ondansetron (ZOFRAN ODT) 4 MG disintegrating tablet   Oral   Take 1 tablet (4 mg total) by mouth every 8 (eight) hours as needed for nausea or vomiting. Patient not taking:  Reported on 09/17/2015   20 tablet   0   . potassium chloride (K-DUR) 10 MEQ tablet   Oral   Take 2 tablets (20 mEq total) by mouth daily. Patient not taking: Reported on 09/16/2015   10 tablet   0   . promethazine (PHENERGAN) 12.5 MG tablet   Oral   Take 1 tablet (12.5 mg total) by mouth every 6 (six) hours as needed for nausea or vomiting. Patient not taking: Reported on 09/16/2015   15 tablet   0   . QUEtiapine (SEROQUEL) 50 MG tablet   Oral   Take 100 mg by mouth at bedtime.         Marland Kitchen zolpidem (AMBIEN) 10 MG tablet   Oral   Take 10 mg  by mouth at bedtime.           Allergies Review of patient's allergies indicates no known allergies.  Family History  Problem Relation Age of Onset  . Lung cancer Father   . Cancer Father     lung cancer  . Alcohol abuse Father   . Lung cancer Paternal Aunt   . Cancer Paternal Aunt     lung  . Bone cancer Paternal Uncle   . Cancer Paternal Uncle     pancreatic  . Depression Mother   . Anxiety disorder Mother   . Arthritis Mother   . Hyperlipidemia Mother   . Hypertension Maternal Grandmother   . Diabetes Paternal Grandmother   . Heart disease Paternal Grandmother   . Hypertension Paternal Grandmother   . Asthma Son   . Vision loss Neg Hx   . COPD Neg Hx     Social History Social History  Substance Use Topics  . Smoking status: Current Some Day Smoker -- 0.25 packs/day for 17 years    Types: Cigarettes  . Smokeless tobacco: Never Used  . Alcohol Use: Yes     Comment: occasional    Review of Systems Constitutional: Negative for fever. Cardiovascular: Negative for chest pain. Respiratory: Negative for shortness of breath. Gastrointestinal: Positive upper abdominal pain and nausea. Negative for vomiting, diarrhea, constipation. Genitourinary: Negative for dysuria. Positive for vaginal spotting. Musculoskeletal: Negative for back pain. Neurological: Negative for headache 10-point ROS otherwise negative.  ____________________________________________   PHYSICAL EXAM:  VITAL SIGNS: ED Triage Vitals  Enc Vitals Group     BP 09/18/15 1954 160/113 mmHg     Pulse Rate 09/18/15 1954 106     Resp 09/18/15 1954 18     Temp 09/18/15 1954 98.9 F (37.2 C)     Temp Source 09/18/15 1954 Oral     SpO2 09/18/15 1954 95 %     Weight 09/18/15 1954 171 lb (77.565 kg)     Height 09/18/15 1954  (1.6 m)     Head Cir --      Peak Flow --      Pain Score 09/18/15 1954 9     Pain Loc --      Pain Edu? --      Excl. in GC? --     Constitutional: Alert and  oriented. Well appearing and in no distress. Eyes: Normal exam ENT   Head: Normocephalic and atraumatic.   Mouth/Throat: Mucous membranes are moist. Cardiovascular: Normal rate, regular rhythm. No murmur Respiratory: Normal respiratory effort without tachypnea nor retractions. Breath sounds are clear and equal bilaterally. No wheezes/rales/rhonchi. Gastrointestinal: Soft, mild to moderate upper abdominal tenderness, mostly in the epigastrium. No rebound or guarding.  No CVA tenderness Musculoskeletal: Nontender with normal range of motion in all extremities.  Neurologic:  Normal speech and language. No gross focal neurologic deficits Skin:  Skin is warm, dry and intact.  Psychiatric: Mood and affect are normal. Speech and behavior are normal.   ____________________________________________    EKG  EKG reviewed and interpreted by myself shows sinus tachycardia at 106 bpm, narrow QRS, normal axis, normal intervals, nonspecific ST changes. No ST elevations.  ____________________________________________    INITIAL IMPRESSION / ASSESSMENT AND PLAN / ED COURSE  Pertinent labs & imaging results that were available during my care of the patient were reviewed by me and considered in my medical decision making (see chart for details).  Patient presents the emergency department with continued abdominal pain. The patient has been seen by GI medicine and is currently being worked up by them. Denies any acute exacerbation of pain, states her pain is worsened progressively over last several weeks. She states they've diagnosed with gastritis but she feels that is incorrect and she feels that something more concerning is going on such as an infection deep within her abdomen. Patient has mild tenderness to palpation, no rebound or guarding. Patient's labs over the last few visits have been largely within normal limits. We'll repeat labs today, and closely monitor in the emergency department. We'll  treat with IV Toradol, IV fluids, and GI cocktail.  Patient's labs are within normal limits. Patient had an MRI of her abdomen several days ago with normal results. I have reviewed the MRI results in the emergency department. Patient is tearful in the emergency department stating there is no way she can deal with the nausea and pain any longer. She is begging to be admitted to the hospital for nausea and pain control. I discussed at length of the patient (20-30 minutes) that she has had an extensive workup within endoscopy positive for H. pylori gastritis, negative lab workup on multiple occasions, and an MRI that shows no acute abnormalities. At this point I do not feel that we can offer the patient any additional workup. The patient is convinced that the gastritis is not the cause of her pain. Patient does not know why she is convinced this is the case. I once again discussed at length that her symptoms, time frame, and workup all suggest gastritis as the cause of her discomfort and symptoms. Patient states if I discharge her home she will come right back to the hospital. This is the patient's fifth emergency department visit in the past 2.5 weeks. [facility] also [facility]. I discussed with the patient that if she is not nauseated and cannot tolerate fluids we will discuss admission to the hospital for nausea control and IV fluids, but we will not be treating her pain. Patient states she does not want her pain treated she wants her gastritis to go away. At this point I do not know what else I can offer the patient from the emergency department, she is refusing discharge, stating she will come back immediately upon being discharged. We will discuss with the hospitalist for admission for nausea control and IV hydration.  Patient will not attempt to eat or drink saying that she feels too nauseated. We'll admit the patient to the hospital for intractable  nausea. ____________________________________________   FINAL CLINICAL IMPRESSION(S) / ED DIAGNOSES  Upper abdominal pain Nausea and vomiting  Minna AntisKevin Shareka Casale, MD 09/18/15 2220  Minna AntisKevin Jubal Rademaker, MD 09/18/15 2333

## 2015-09-18 NOTE — ED Notes (Addendum)
Dr. Lenard LancePaduchowski explained to pt that labs and x-ray look good and she will not be admitted. Pt verbalizes "I am hurting." Pt is tearful. Dr. Lenard LancePaduchowski explains to pt that she needs to see a GI doctor and a pain clinic, not be admitted. Pt argues with Dr. Lenard LancePaduchowski that she needs to be admitted. Dr. Lenard LancePaduchowski explains what would happen if she would be admitted, and reiterates that outpatient doctors are who she needs to see. Dr. Lenard LancePaduchowski states that there is no reason to admit pt and pt will be discharged to see outpatient doctors, pt states "I will go home and take the whole bottle of Seroquel." Dr. Lenard LancePaduchowski tells pt he can IVC her, pt states she does not want to kill herself, she just wants to not be in pain and wants to sleep through the pain. Dr. Lenard LancePaduchowski suggests an MRI if pt has not had one in the past few weeks. Pt verbalizes understanding to MRI. Pt states she was told she has H.Pylori. Dr. Lenard LancePaduchowski states H.Pylori is gastritis.  Pt states "my dad had gastritis, this is not gastritis. Pt states she has taken antibiotics for H.Pylori. Dr. Lenard LancePaduchowski states pt needs to see GI doctor again.

## 2015-09-18 NOTE — ED Notes (Addendum)
Pt presents to ED c/o R chest pain, upper abdominal pain, stating its "under my rib cage", and a small amount of pink vaginal bleeding.  Pain started yesterday, vaginal bleeding started 2 days ago. LMP started 09/03/15. Pt states not sexually active.

## 2015-09-18 NOTE — ED Notes (Signed)
Patient with complaint of right sided chest pain that started yesterday. Patient reports that she has had some nausea and vomiting. Patient states that she has also started having some light vaginal bleeding that started today.

## 2015-09-19 ENCOUNTER — Observation Stay: Payer: Medicaid Other

## 2015-09-19 LAB — POTASSIUM: Potassium: 4.2 mmol/L (ref 3.5–5.1)

## 2015-09-19 LAB — SEDIMENTATION RATE: Sed Rate: 10 mm/hr (ref 0–20)

## 2015-09-19 LAB — HEMOGLOBIN A1C: Hgb A1c MFr Bld: 5.6 % (ref 4.0–6.0)

## 2015-09-19 LAB — TSH: TSH: 0.592 u[IU]/mL (ref 0.350–4.500)

## 2015-09-19 LAB — MAGNESIUM: Magnesium: 2.2 mg/dL (ref 1.7–2.4)

## 2015-09-19 MED ORDER — SCOPOLAMINE 1 MG/3DAYS TD PT72
1.0000 | MEDICATED_PATCH | TRANSDERMAL | Status: DC
  Administered 2015-09-19: 1.5 mg via TRANSDERMAL
  Filled 2015-09-19: qty 1

## 2015-09-19 MED ORDER — HYDRALAZINE HCL 20 MG/ML IJ SOLN
10.0000 mg | Freq: Once | INTRAMUSCULAR | Status: AC
  Administered 2015-09-19: 10 mg via INTRAVENOUS
  Filled 2015-09-19: qty 1

## 2015-09-19 MED ORDER — KETOROLAC TROMETHAMINE 30 MG/ML IJ SOLN
30.0000 mg | Freq: Once | INTRAMUSCULAR | Status: AC
  Administered 2015-09-19: 30 mg via INTRAVENOUS
  Filled 2015-09-19: qty 1

## 2015-09-19 MED ORDER — PANTOPRAZOLE SODIUM 40 MG PO TBEC: 40.0000 mg | DELAYED_RELEASE_TABLET | Freq: Two times a day (BID) | ORAL | Status: DC

## 2015-09-19 MED ORDER — ONDANSETRON 4 MG PO TBDP: 4.0000 mg | ORAL_TABLET | Freq: Four times a day (QID) | ORAL | Status: DC | PRN

## 2015-09-19 MED ORDER — PROCHLORPERAZINE 25 MG RE SUPP: 25.0000 mg | Freq: Two times a day (BID) | RECTAL | Status: DC | PRN

## 2015-09-19 MED ORDER — HYDROCODONE-ACETAMINOPHEN 7.5-325 MG/15ML PO SOLN: 10.0000 mL | Freq: Four times a day (QID) | ORAL | Status: DC | PRN

## 2015-09-19 MED ORDER — ACETAMINOPHEN 650 MG RE SUPP
650.0000 mg | Freq: Four times a day (QID) | RECTAL | Status: DC | PRN
Start: 2015-09-19 — End: 2015-09-19

## 2015-09-19 MED ORDER — IOHEXOL 240 MG/ML SOLN
25.0000 mL | INTRAMUSCULAR | Status: AC
  Administered 2015-09-19 (×2): 25 mL via ORAL

## 2015-09-19 MED ORDER — HYDROMORPHONE HCL 1 MG/ML IJ SOLN
0.5000 mg | Freq: Four times a day (QID) | INTRAMUSCULAR | Status: DC | PRN
  Administered 2015-09-19: 0.5 mg via INTRAVENOUS
  Filled 2015-09-19: qty 1

## 2015-09-19 MED ORDER — PANTOPRAZOLE SODIUM 40 MG PO TBEC
40.0000 mg | DELAYED_RELEASE_TABLET | Freq: Two times a day (BID) | ORAL | Status: DC
Start: 2015-09-19 — End: 2015-09-19

## 2015-09-19 MED ORDER — IOHEXOL 350 MG/ML SOLN
100.0000 mL | Freq: Once | INTRAVENOUS | Status: AC | PRN
  Administered 2015-09-19: 100 mL via INTRAVENOUS

## 2015-09-19 MED ORDER — POTASSIUM CHLORIDE CRYS ER 20 MEQ PO TBCR
40.0000 meq | EXTENDED_RELEASE_TABLET | ORAL | Status: AC
  Administered 2015-09-19 (×2): 40 meq via ORAL
  Filled 2015-09-19 (×2): qty 2

## 2015-09-19 MED ORDER — HEPARIN SODIUM (PORCINE) 5000 UNIT/ML IJ SOLN
5000.0000 [IU] | Freq: Three times a day (TID) | INTRAMUSCULAR | Status: DC
  Administered 2015-09-19 (×2): 5000 [IU] via SUBCUTANEOUS
  Filled 2015-09-19 (×2): qty 1

## 2015-09-19 MED ORDER — ONDANSETRON HCL 4 MG/2ML IJ SOLN
4.0000 mg | Freq: Four times a day (QID) | INTRAMUSCULAR | Status: DC | PRN
Start: 2015-09-19 — End: 2015-09-19

## 2015-09-19 MED ORDER — PANTOPRAZOLE SODIUM 40 MG PO TBEC
40.0000 mg | DELAYED_RELEASE_TABLET | Freq: Every day | ORAL | Status: DC
  Administered 2015-09-19: 40 mg via ORAL
  Filled 2015-09-19 (×2): qty 1

## 2015-09-19 MED ORDER — HYDRALAZINE HCL 20 MG/ML IJ SOLN
10.0000 mg | INTRAMUSCULAR | Status: DC | PRN
  Administered 2015-09-19 (×2): 10 mg via INTRAVENOUS
  Filled 2015-09-19 (×2): qty 1

## 2015-09-19 MED ORDER — ZOLPIDEM TARTRATE 5 MG PO TABS: 10.0000 mg | ORAL_TABLET | Freq: Every day | ORAL | Status: DC

## 2015-09-19 MED ORDER — QUETIAPINE FUMARATE 25 MG PO TABS: 100.0000 mg | ORAL_TABLET | Freq: Every day | ORAL | Status: DC

## 2015-09-19 MED ORDER — PROMETHAZINE HCL 25 MG PO TABS: 12.5000 mg | ORAL_TABLET | Freq: Four times a day (QID) | ORAL | Status: DC | PRN

## 2015-09-19 MED ORDER — KETOROLAC TROMETHAMINE 30 MG/ML IJ SOLN: 30.0000 mg | Freq: Four times a day (QID) | INTRAMUSCULAR | Status: DC

## 2015-09-19 MED ORDER — ONDANSETRON 4 MG PO TBDP: 4.0000 mg | ORAL_TABLET | Freq: Three times a day (TID) | ORAL | Status: DC | PRN

## 2015-09-19 MED ORDER — AMLODIPINE BESYLATE 5 MG PO TABS
5.0000 mg | ORAL_TABLET | Freq: Every day | ORAL | Status: DC
  Administered 2015-09-19: 5 mg via ORAL
  Filled 2015-09-19: qty 1

## 2015-09-19 MED ORDER — ALPRAZOLAM 0.5 MG PO TABS
2.0000 mg | ORAL_TABLET | Freq: Two times a day (BID) | ORAL | Status: DC | PRN
  Administered 2015-09-19: 2 mg via ORAL
  Filled 2015-09-19: qty 4

## 2015-09-19 MED ORDER — MORPHINE SULFATE (PF) 2 MG/ML IV SOLN
2.0000 mg | Freq: Once | INTRAVENOUS | Status: AC
  Administered 2015-09-19: 2 mg via INTRAVENOUS
  Filled 2015-09-19: qty 1

## 2015-09-19 MED ORDER — ACETAMINOPHEN 325 MG PO TABS: 650.0000 mg | ORAL_TABLET | Freq: Four times a day (QID) | ORAL | Status: DC | PRN

## 2015-09-19 MED ORDER — POTASSIUM CHLORIDE IN NACL 40-0.9 MEQ/L-% IV SOLN
INTRAVENOUS | Status: DC
  Administered 2015-09-19 (×2): 100 mL/h via INTRAVENOUS
  Filled 2015-09-19 (×4): qty 1000

## 2015-09-19 MED ORDER — DOCUSATE SODIUM 100 MG PO CAPS
100.0000 mg | ORAL_CAPSULE | Freq: Two times a day (BID) | ORAL | Status: DC
  Administered 2015-09-19 (×2): 100 mg via ORAL
  Filled 2015-09-19 (×2): qty 1

## 2015-09-19 MED ORDER — POTASSIUM CHLORIDE CRYS ER 20 MEQ PO TBCR
20.0000 meq | EXTENDED_RELEASE_TABLET | Freq: Every day | ORAL | Status: DC
  Filled 2015-09-19: qty 2

## 2015-09-19 MED ORDER — POTASSIUM CHLORIDE CRYS ER 20 MEQ PO TBCR: 20.0000 meq | EXTENDED_RELEASE_TABLET | Freq: Every day | ORAL | Status: DC

## 2015-09-19 MED ORDER — KETOROLAC TROMETHAMINE 30 MG/ML IJ SOLN
30.0000 mg | Freq: Four times a day (QID) | INTRAMUSCULAR | Status: DC | PRN
  Filled 2015-09-19: qty 1

## 2015-09-19 MED ORDER — IOHEXOL 300 MG/ML  SOLN: 100.0000 mL | Freq: Once | INTRAMUSCULAR | Status: DC | PRN

## 2015-09-19 MED ORDER — HYDROCODONE-ACETAMINOPHEN 7.5-325 MG PO TABS: 1.0000 | ORAL_TABLET | Freq: Four times a day (QID) | ORAL | Status: DC | PRN

## 2015-09-19 MED ORDER — CITALOPRAM HYDROBROMIDE 20 MG PO TABS: 40.0000 mg | ORAL_TABLET | Freq: Every day | ORAL | Status: DC

## 2015-09-19 NOTE — Consult Note (Signed)
GI Inpatient Consult Note  Reason for Consult: Chronic Abdominal pain   Attending Requesting Consult: Dr. Sheryle Hail  History of Present Illness: Laurie Frederick is a 39 y.o. female who is well known  to our office. She was previously hospitalized September 21 through the 26 at [facility] at which time she had already had an upper endoscopy ( on 07/07/2015 with Dr. Mechele Collin during an inpatient stay) that showed severe gastritis and was positive for H pylori.  She had not taken the follow-up medication and was being hospitlaized again with the same complaints of epigastric abdominal pain, nausea, vomiting.  During that hospitalization she had been encouraged to complete her medications and follow up in our office.  She did follow up in our office on August 05, 2015, was still having the same chief complaints.  She reported that she had been taking the amoxicillin and clarithromycin, however on pill count she had an uneven number of pills left and believes she may have missed some days.  She did not fill the Protonix due to cost prescriptions.  She reported the cost of 3 dollars for each prescription.  Refilled her antibiotics to make sure she had the appropriate amount of each one and encouraged her to take them along with the Protonix, even gave her written detailed instructions on what time of day to take which pills.  She also asked for pain medication during this visit, I noted that she is already overmedicated and encouraged patient to use Tylenol for her breakthrough pain.  Also scheduled a follow-up MRI for the liver lesion.  She was then seen again in our office on September 03, 2015 with the same chief complaints.  Since her last office visit, her primary care called [place] during an office visit with her  and told me patient had been instructed by me to go to her for her pain medications.  She did not show up for her MRI as scheduled, had failed to follow up in our office on a scheduled  appointment, was here to see me after being in the emergency department the night before.  During this visit she once again was asking for narcotics to help with the pain, after I told her we did not prescribe them,  she stated that we were not doing anything to help her.  Of note she once again appeared to be overmedicated during her visit, words were slurred and she was irrational.  She also reported that she was unable to keep anything down and had been this way since she left the hospital the end of September.  Her weight on her last visit, October 6 was 170 lb, her visit weight on November 4 was 170 lb.  We did reschedule the date for the MRI.  Since her last visit she has had multiple visits to the emergency department at [facility] as well as visiting the emergency department at [facility]  almost immediately following an [facility] ED visit.  She was hospitalized once again November 6 at [facility].  Dr. Servando Snare did perform an upper endoscopy for the same indication.  His findings showed; entire examined  Esophagus was normal, entire examined stomach was normal, examined duodenum was normal.  She did have the MRI completed on November 18, findings showed dominant inferior right liver lesion is stable over the 19 months since prior MRI.  Interval stability is most consistent with a benign process.  On today's study, there does appear to be retention of  contrast peripherally in the lesion on delayed hepatocytes phase imaging.  Together, imaging features remain most suggestive of focal nodular hyperplasia.  Otherwise stable exam.  No new or progressive findings.  I also included some copies of notes from ED visits:  ----------------------------------------- 9:12 AM on 09/07/2015 -----------------------------------------  after Haldol patient is no longer nauseated she is lying comfortably in the bed abdomen remains benign and she is much less dramatic in her presentation. She is  requesting "something for pain" patient however was sound asleep when I entered the room. We will defer pain medication for now although I suspect she will not want to go home unless she gets some. It is somewhat difficult however to wake someone up from a sound slumber to give pain medication. I do note from review of her [facility] provider database shows that the patient had a prescription for hydromorphone 2 mg tablets on October 5, tramadol on 9/26, Percocet on 9/16, Percocet on 9/12, hydromorphone on 9/9, oxycodone on August 2. This is a large quantity of narcotic pain medications and raises concern. ____________________________________________  ----------------------------------------- 10:10 AM on 09/07/2015 -----------------------------------------  Patient has woken up and has been on the call bell numerous time demanding IV narcotics. I will not give her IV narcotics at this time I do not see any indication for. I have told her I will give her a dose of Percocet here. Patient is worried she might throw that up although this but no actual vomiting since she got here. We'll give her a dose of Phenergan at her request. Verlon Au still 10 replace her potassium. She is yet to give [place] a urine sample. We have encouraged her to do so. Patient remains in no acute distress. Serial abdominal exams remain benign if I can replace her potassium will try to get her home.   ----------------------------------------- 12:37 PM on 09/07/2015 -----------------------------------------  [place] and is much better, patient feels much better, no vomiting here for 5 hours, I am reassured by her findings. Upon discharge patient did request a pelvic exam. She states that she had a vaginal discharge a few weeks ago and is worried perhaps that could contribute to her symptoms. She has no evidence of PID on external exam but we will do a pelvic exam to verify no evidence of infection   ED Course and FINAL  IMPRESSION: September 16, 2015 10:26 PM Initial labs are unremarkable and not surprisingly have not changed since 12 hours earlier when performed at [facility]. She has no evidence of pancreatitis, acute kidney injury, electrolyte derangement, or biliary stasis. Urine studies are pending. She endorses continued pain but has refused a GI cocktail, as well as refusing Haldol, magnesium, Carafate. She is asking if she can have the water like she usually does, and is complaining that the patient next to her, who has terminal and metastatic cancer, has received Dilaudid when she has not.  I discussed the patient the low suspicion for acute pathology requiring intervention at this time the need for continued outpatient follow-up. I have eventually incarcerated 5 Valium for any involvement of esophageal spasm for what appears to be reflux and gastritis related symptoms. Continue to rehydrate and await urine studies with goal for discharge home with tolerating oral intake.  September 16, 2015 10:52 PM Urinalysis with trace ketones but otherwise no evidence of concentration to suggest systemic dehydration. Pregnancy test is still pending but was negative as of earlier today at [facility]  September 16, 2015 11:24 PM Pregnancy test negative. I  have reevaluated the patient and discussed my impression with her. Of note she is sustained hypertensive here as well as an multiple prior healthcare encounters. Will start her on chlorthalidone with outpatient follow-up. Despite her large volume bucket of vomit that appeared to be ginger ale when she arrived, she has had no further vomiting here despite only a small dose of Phenergan. I do believe that her symptoms are likely chronic in nature with an acute life-threatening pathology. She is already under the care of an outpatient GI specialist with MRI scheduled for tomorrow. I believe she is safe for outpatient discharge when she completes her IV fluids. The patient's family  understands and is in agreement, although the patient is still asking for opiate pain medication for her symptoms. ED patient care signed out to Dr. Mayford Knife.  She reports today  that her pain is right upper quadrant in nature, 5 or 6/10, even on Dilaudid.  She reports her pain seems to be located behind her right breast directly under her ribs.  She reports she is taking Phenergan at home, but when she is in pain it makes her nauseous, the only time she is not nauseous is when she is not in pain.  She reports her vomiting as 3 or 4 times at night, will get up 5 times throughout the night to take a warm shower which gives her some relief.  She reports not having a bowel movement in 6 days.  She reports that she did finish her antibiotics, is taking the Protonix as well as Prilosec.  Of note, when I entered the room she was talking on the phone and drinking coffee.  She had only eating the peaches from her breakfast.  On review of the emergency room notes of her visit last night that created this admission, there is a note that she stated if they did not admit her she would take a whole bottle of Seroquel.  She does go to [facility] for psychiatric health.  She reports she feels everyone is giving up on her, and not doing anything more to try and find out what is causing her pain.  She does speak again of her family history of cancer.   Past Medical History:  Past Medical History  Diagnosis Date  . Sleep apnea   . GERD (gastroesophageal reflux disease)   . Hyperlipidemia   . Depression   . Anxiety   . PTSD (post-traumatic stress disorder)   . Obesity   . Anemia   . Vitamin D deficiency   . Nontoxic uninodular goiter   . Multiple gastric ulcers   . H pylori ulcer     Problem List: Patient Active Problem List   Diagnosis Date Noted  . Abdominal pain, epigastric   . Gastritis   . Hypokalemia 08/16/2015  . Chronic prescription benzodiazepine use 08/16/2015  . Intractable  nausea and vomiting 07/22/2015  . Epigastric pain   . Helicobacter positive gastritis 07/16/2015  . Gastritis and gastroduodenitis 07/16/2015  . Hyperlipidemia 07/13/2015  . Vitamin D deficiency 07/13/2015  . Thyroid nodule 07/13/2015  . Polyp of gallbladder 07/13/2015  . Liver nodule 07/13/2015  . Nausea & vomiting 07/13/2015  . Gastroenteritis 07/06/2015  . 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 Alcohol abuse 04/01/2015    Past Surgical History: Past Surgical History  Procedure Laterality Date  . Cesarean section    . Esophagogastroduodenoscopy (egd) with propofol Left 07/07/2015    Procedure: ESOPHAGOGASTRODUODENOSCOPY (EGD) WITH PROPOFOL;  Surgeon: Molly Maduro  Daron Offer, MD;  Location: [facility] ENDOSCOPY;  Service: Endoscopy;  Laterality: Left;  . Cesarean section    . Esophagogastroduodenoscopy (egd) with propofol N/A 09/05/2015    Procedure: ESOPHAGOGASTRODUODENOSCOPY (EGD) WITH PROPOFOL;  Surgeon: Midge Minium, MD;  Location: [facility] ENDOSCOPY;  Service: Endoscopy;  Laterality: N/A;    Allergies: No Known Allergies  Home Medications: Prescriptions prior to admission  Medication Sig Dispense Refill Last Dose  . alprazolam (XANAX) 2 MG tablet Take 2 mg by mouth 2 (two) times daily as needed for anxiety.    prn at prn  . citalopram (CELEXA) 40 MG tablet Take 40 mg by mouth at bedtime.   09/17/2015 at unknown time  . QUEtiapine (SEROQUEL) 50 MG tablet Take 100 mg by mouth at bedtime.   09/17/2015 at Unknown time  . zolpidem (AMBIEN) 10 MG tablet Take 10 mg by mouth at bedtime.   09/17/2015 at Unknown time   Home medication reconciliation was completed with the patient.   Scheduled Inpatient Medications:   . amLODipine  5 mg Oral Daily  . citalopram  40 mg Oral QHS  . docusate sodium  100 mg Oral BID  . heparin  5,000 Units Subcutaneous 3 times per day  . pantoprazole  40 mg Oral Daily  . [START ON 09/20/2015] potassium chloride SA  20 mEq Oral Daily  . potassium chloride  40 mEq  Oral Q4H  . QUEtiapine  100 mg Oral QHS  . scopolamine  1 patch Transdermal Q72H  . zolpidem  10 mg Oral QHS    Continuous Inpatient Infusions:   . 0.9 % NaCl with KCl 40 mEq / L 100 mL/hr (09/19/15 0356)    PRN Inpatient Medications:  acetaminophen **OR** acetaminophen, alprazolam, hydrALAZINE, HYDROcodone-acetaminophen, HYDROmorphone (DILAUDID) injection, ondansetron (ZOFRAN) IV, ondansetron, prochlorperazine, promethazine  Family History: family history includes Alcohol abuse in her father; Anxiety disorder in her mother; Arthritis in her mother; Asthma in her son; Bone cancer in her paternal uncle; Cancer in her father, paternal aunt, and paternal uncle; Depression in her mother; Diabetes in her paternal grandmother; Heart disease in her paternal grandmother; Hyperlipidemia in her mother; Hypertension in her maternal grandmother and paternal grandmother; Lung cancer in her father and paternal aunt. There is no history of Vision loss or COPD.    Social History:   reports that she has been smoking Cigarettes.  She has a 4.25 pack-year smoking history. She has never used smokeless tobacco. She reports that she does not drink alcohol or use illicit drugs.   Review of Systems: Constitutional: Weight is stable.  Eyes: No changes in vision. ENT: No oral lesions, sore throat.  GI: see HPI.  Heme/Lymph: No easy bruising.  CV: No chest pain.  GU: No hematuria.  Integumentary: No rashes.  Neuro: No headaches.  Psych: Positive for depression / anxiety Endocrine: No heat/cold intolerance.  Allergic/Immunologic: No urticaria.  Resp: No cough, SOB.  Musculoskeletal: No joint swelling.    Physical Examination: BP 178/120 mmHg  Pulse 108  Temp(Src) 98.8 F (37.1 C) (Oral)  Resp 16  Ht 5\' 3"  (1.6 m)  Wt 73.483 kg (162 lb)  BMI 28.70 kg/m2  SpO2 98%  LMP 09/03/2015 Gen: NAD, alert and oriented x 4 HEENT: PEERLA, EOMI, Neck: supple, no JVD or thyromegaly Chest: CTA bilaterally, no  wheezes, crackles, or other adventitious sounds CV: tachycardia noted, no m/g/c/r Abd: soft,generalized tenderness, ND, +BS in all four quadrants; no HSM, guarding, ridigity, or rebound tenderness Ext: no edema, well perfused with  2+ pulses, Skin: no rash or lesions noted Lymph: no LAD  Data: Lab Results  Component Value Date   WBC 9.4 09/18/2015   HGB 12.8 09/18/2015   HCT 39.3 09/18/2015   MCV 87.4 09/18/2015   PLT 245 09/18/2015    Recent Labs Lab 09/16/15 1000 09/18/15 2104  HGB 12.7 12.8   Lab Results  Component Value Date   NA 138 09/18/2015   K 2.9* 09/18/2015   CL 101 09/18/2015   CO2 28 09/18/2015   BUN 14 09/18/2015   CREATININE 0.59 09/18/2015   Lab Results  Component Value Date   ALT 14 09/18/2015   AST 12* 09/18/2015   ALKPHOS 67 09/18/2015   BILITOT 0.7 09/18/2015   No results for input(s): APTT, INR, PTT in the last 168 hours.  Imaging:  CLINICAL DATA: Mid abdominal pain, nausea and vomiting since eating spaghetti and salad for dinner.  EXAM: CT ABDOMEN AND PELVIS WITH CONTRAST  TECHNIQUE: Multidetector CT imaging of the abdomen and pelvis was performed using the standard protocol following bolus administration of intravenous contrast.  CONTRAST: 100mL OMNIPAQUE IOHEXOL 300 MG/ML SOLN  COMPARISON: MRI abdomen 02/04/2014  FINDINGS: Lung bases are clear.  Focal fatty infiltration adjacent to the falciform ligament of the liver. Gallbladder, spleen, pancreas, adrenal glands, kidneys, abdominal aorta, inferior vena cava, and retroperitoneal lymph nodes are unremarkable. Stomach, small bowel, and colon are not abnormally distended. No discrete wall thickening is appreciated although evaluation of the bowel wall is limited due to under distention. No free air or free fluid in the abdomen. Minimal umbilical hernia containing fat.  Pelvis: Uterus is anteverted. Intrauterine device is present. Heterogeneous nodular enhancement of  the uterus consistent with fibroids. No abnormal adnexal masses. No free or loculated pelvic fluid collections. No pelvic mass or lymphadenopathy. Bladder wall is not thickened. Appendix is normal. No destructive bone lesions.  IMPRESSION: No focal acute process demonstrated in the abdomen or pelvis. No evidence of bowel obstruction or inflammation. Minimal umbilical hernia containing fat. Focal fatty infiltration in the liver.   Electronically Signed  By: Burman NievesWilliam Stevens M.D.  On: 07/05/2015 06:51   CLINICAL DATA: Weight loss. Epigastric pain.  EXAM: ULTRASOUND ABDOMEN COMPLETE  COMPARISON: CT 07/05/2015 ; MRI 02/04/2014.  FINDINGS: Gallbladder: 9 mm non mobile echogenic focus within the gallbladder lumen potentially representing nonmobile stone or polyp. No gallbladder wall thickening. No pericholecystic fluid. Negative sonographic Murphy sign.  Common bile duct: Diameter: 2 mm  Liver: Within anterior right hepatic lobe there is a 1.5 x 1.1 x 1.3 cm hypoechoic lesion. Additionally within the inferior right hepatic lobe there is a 2.1 x 2.3 x 1.7 cm hypoechoic lesion. These lesions are poorly visualized and characterized on current exam.  IVC: No abnormality visualized.  Pancreas: Visualized portion unremarkable.  Spleen: Size and appearance within normal limits.  Right Kidney: Length: 12.4 cm. Echogenicity within normal limits. No mass or hydronephrosis visualized.  Left Kidney: Length: 11.1 cm. Echogenicity within normal limits. No mass or hydronephrosis visualized.  Abdominal aorta: No aneurysm visualized.  Other findings: None.  IMPRESSION: Two hypoechoic hepatic lesions as described above. Patient had prior abdominal MRI 02/04/2014 for evaluation of these lesions, one lesion which was indeterminate in etiology. On this MRI it was recommended that the patient have a six-month follow-up MRI evaluation to demonstrate stability ;  therefore, a follow-up evaluation with MRI (pre and post contrast) for comparison with prior MRI is recommended.  Polyp versus stone within the gallbladder lumen. Recommend follow-up  gallbladder ultrasound in 12 months.   Electronically Signed  By: Annia Belt M.D.  On: 07/22/2015 13:08  EXAM: US ABDOMEN LIMITED DATE: 07/31/15 16:15:12 ACCESSION: [id number] UN DICTATED: 07/31/15 17:57:18 INTERPRETATION LOCATION: [facility]  CLINICAL INDICATION: 39 Year Old (F): right upper quadrant pain with nausea and vomiting  -   COMPARISON: None  TECHNIQUE:  Limited views of the right upper abdomen were obtained using gray scale and limited color Doppler imaging.  FINDINGS:  The intra- and extrahepatic ducts were not dilated.   The gallbladder was physiologically distended with multiple focal rounded areas of debris that moved with patient positioning. The gallbladder wall was mildly prominent.  A sonographic Murphy's sign could not be evaluated due to patient medication.  No pericholecystic fluid was seen.  IMPRESSION:  Sludge balls within the gallbladder. Mildly prominent gallbladder wall. No specific evidence of cholecystitis or biliary obstruction.  Common Hepatic Duct:      1.8 mm                   Proximal Common Bile Duct:      2.5 mm       Distal Common Bile Duct:      2.3 mm         Gallbladder Wall :      3.0 mm Gallbladder Wall Range: 2.30 mm-3.00 mm.   CLINICAL DATA: Subsequent encounter for 2.4 cm right hepatic lobe lesion.    EXAM: MRI ABDOMEN WITHOUT AND WITH CONTRAST  TECHNIQUE: Multiplanar multisequence MR imaging of the abdomen was performed both before and after the administration of intravenous contrast.  CONTRAST: 8 cc Eovist  COMPARISON: Ultrasound exam from 09/03/2015. CT scan from 07/05/2015. MRI from 02/04/2014.  FINDINGS: Lower chest: Unremarkable.  Hepatobiliary: 7 mm subcapsular lesion in the inferior right liver (image 18  series 4) unchanged in the interval. 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 Mild diffuse fatty deposition within the liver is again noted. Previously described hypervascular lesion in the inferior right liver again is identified and is most evident on arterial phase imaging (see image 34 series 11). 1.9 cm maximum diameter today is stable compared to 2.1 cm previously. This lesion is essentially isointense to liver parenchyma on T2 weighted imaging although there may be some subtle central hyperintensity. Subtle hypo intensity is seen within the lesion on T1 weighted precontrast imaging. 20 minutes delayed hepatocytes phase imaging shows retention of contrast within the peripheral aspect of the lesion.  There is no evidence for gallstones, gallbladder wall thickening, or pericholecystic fluid. No intrahepatic or extrahepatic biliary dilation.  Pancreas: No focal mass lesion. No dilatation of the main duct. No intraparenchymal cyst. No peripancreatic edema.  Spleen: No splenomegaly. No focal mass lesion.  Adrenals/Urinary Tract: No adrenal nodule or mass. Kidneys are unremarkable.  Stomach/Bowel: Stomach is nondistended. No gastric wall thickening. No evidence of outlet obstruction. Duodenum is normally positioned as is the ligament of Treitz.  Vascular/Lymphatic: No abdominal aortic aneurysm. There is no gastrohepatic or hepatoduodenal ligament lymphadenopathy. No intraperitoneal or retroperitoneal lymphadenopy.  Other: No intraperitoneal free fluid.  Musculoskeletal: No abnormal marrow signal within the visualized bony anatomy.  IMPRESSION: 1. Dominant inferior right liver lesion is stable over the 19 months since prior MRI. Interval stability is most consistent with a benign process. On today's study, there does appear to be retention of contrast peripherally in the lesion on delayed hepatocyte phase imaging. Together, imaging  features remain most suggestive of focal nodular hyperplasia. 2. Otherwise stable  exam. No new or progressive findings.   Electronically Signed  By: Kennith Center M.D.  On: 09/17/2015 11:44         CLINICAL DATA: Chest pain  EXAM: PORTABLE CHEST 1 VIEW  COMPARISON: 09/07/2015 chest radiograph.  FINDINGS: Stable cardiomediastinal silhouette with normal heart size. No pneumothorax. No pleural effusion. Clear lungs, with no focal lung consolidation and no pulmonary edema.  IMPRESSION: No active disease.   Electronically Signed  By: Delbert Phenix M.D.  On: 09/18/2015 21:03 Assessment/Plan: Ms. Blasco is a 39 y.o. female with complaints of chronic abdominal pain, N / V.  Last Egd was 09/05/2015 with normal findings.  Pot was 2.9, BP 178/120 and Pulse 108. She had had multiple forms of imaging in the last two months without revealing the nature of her pain.  Recommendations: We recommend that she be treated with Protonix BID, check SED and CRP.  No other GI intervention at this time. Thank you for the consult. Please call with questions or concerns.  Carney Harder, PA-C  I personally performed these services.

## 2015-09-19 NOTE — Progress Notes (Addendum)
Patient requesting MD to be paged for pain medication. MD notified and orders received. Also notified of BP 182/111 orders received

## 2015-09-19 NOTE — ED Notes (Addendum)
Pt was given Mal[place]key tray and cola to attempt eating at 22:20 . Pt states she took 1 bite of [place] sandwich and few sips of cola. Pt states she is less nauseous after Phenergan.

## 2015-09-19 NOTE — Consult Note (Signed)
Obstetrics & Gynecology Consult H&P    Consulting Department: Internal Medicine   Consulting Physician: Mechele Collin, MD  Consulting Question: Vaginal bleeding IUD   History of Present Illness: Patient is a 39 y.o. [id number] currently admitted to the medicine service for intractable nausea and epigastric abdominal pain.  She has had an extensive work up both here at [facility] and [facility] dating back to at least April 2015 for this complaint.  So far no clear etiology for the patient's symptoms has been identified.  She also reports menorrhagia, has a Paragard IUD in place.  Her IUD was placed at [facility] in 2007 following the birth of her son.  She reports regular monthly periods, usually up to a week of spotting then 2-3 days of heavier flow where she goes through a pad every 2 hours.  These are associated with dysmenorrhea.  She denies a history of abnormal paps or STI.  Reports her last pap was early this year and normal.     Review of Systems:10 point review of systems  Past Medical History:  Past Medical History  Diagnosis Date  . Sleep apnea   . GERD (gastroesophageal reflux disease)   . Hyperlipidemia   . Depression   . Anxiety   . PTSD (post-traumatic stress disorder)   . Obesity   . Anemia   . Vitamin D deficiency   . Nontoxic uninodular goiter   . Multiple gastric ulcers   . H pylori ulcer     Past Surgical History:  Past Surgical History  Procedure Laterality Date  . Cesarean section    . Esophagogastroduodenoscopy (egd) with propofol Left 07/07/2015    Procedure: ESOPHAGOGASTRODUODENOSCOPY (EGD) WITH PROPOFOL;  Surgeon: Manya Silvas, MD;  Location: [facility];  Service: Endoscopy;  Laterality: Left;  . Cesarean section    . Esophagogastroduodenoscopy (egd) with propofol N/A 09/05/2015    Procedure: ESOPHAGOGASTRODUODENOSCOPY (EGD) WITH PROPOFOL;  Surgeon: Lucilla Lame, MD;  Location: [facility] ENDOSCOPY;  Service: Endoscopy;  Laterality: N/A;  . Dilation and  curettage of uterus      x 2 TAB    Gynecologic History: Paragard IUD placed 2007, no history of STI or abnormal.  Last pap at [facility] practice per patient  Obstetric History: [id number], TAB x 2 by D&C, VAVD x 1, and C-section x 1  Family History:  Family History  Problem Relation Age of Onset  . Lung cancer Father   . Cancer Father     lung cancer  . Alcohol abuse Father   . Lung cancer Paternal Aunt   . Cancer Paternal Aunt     lung  . Bone cancer Paternal Uncle   . Cancer Paternal Uncle     pancreatic  . Depression Mother   . Anxiety disorder Mother   . Arthritis Mother   . Hyperlipidemia Mother   . Hypertension Maternal Grandmother   . Diabetes Paternal Grandmother   . Heart disease Paternal Grandmother   . Hypertension Paternal Grandmother   . Asthma Son   . Vision loss Neg Hx   . COPD Neg Hx     Social History:  Social History   Social History  . Marital Status: Single    Spouse Name: N/A  . Number of Children: N/A  . Years of Education: N/A   Occupational History  . Not on file.   Social History Main Topics  . Smoking status: Current Some Day Smoker -- 0.25 packs/day for 17 years    Types: Cigarettes  .  Smokeless tobacco: Never Used  . Alcohol Use: No     Comment: occasional  . Drug Use: No  . Sexual Activity: Not Currently    Birth Control/ Protection: IUD   Other Topics Concern  . Not on file   Social History Narrative   ** Merged History Encounter **        Allergies:  No Known Allergies  Medications: Prior to Admission medications   Medication Sig Start Date End Date Taking? Authorizing Provider  alprazolam Duanne Moron) 2 MG tablet Take 2 mg by mouth 2 (two) times daily as needed for anxiety.    Yes Historical Provider, MD  citalopram (CELEXA) 40 MG tablet Take 40 mg by mouth at bedtime.   Yes Historical Provider, MD  QUEtiapine (SEROQUEL) 50 MG tablet Take 100 mg by mouth at bedtime.   Yes Historical Provider, MD  zolpidem (AMBIEN)  10 MG tablet Take 10 mg by mouth at bedtime.   Yes Historical Provider, MD  HYDROcodone-acetaminophen (NORCO) 7.5-325 MG tablet Take 1 tablet by mouth every 6 (six) hours as needed for moderate pain. 09/19/15   Srikar Sudini, MD  ondansetron (ZOFRAN ODT) 4 MG disintegrating tablet Take 1 tablet (4 mg total) by mouth every 6 (six) hours as needed for nausea or vomiting. 09/19/15   Hillary Bow, MD  pantoprazole (PROTONIX) 40 MG tablet Take 1 tablet (40 mg total) by mouth 2 (two) times daily before a meal. 09/19/15   Hillary Bow, MD    Physical Exam Vitals: Blood pressure 134/83, pulse 94, temperature 98.8 F (37.1 C), temperature source Oral, resp. rate 16, height 5' 3"  (1.6 m), weight 73.483 kg (162 lb), last menstrual period 09/03/2015, SpO2 100 %. General: NAD HEENT: normocphalic, anicteric Pulmonary: CTAB Cardiovascular: RRR Abdomen: NABS, soft, non-distended, no tenderness on distracted exam.  Non-acute abdomen Genitourinary: deferred given imaging Extremities: no edema, no erythema Neurologic: CN II-XII grossly intact Psychiatric: A&O x 3, mood appopriate, affect full   Labs: Results for orders placed or performed during the hospital encounter of 09/18/15 (from the past 72 hour(s))  CBC     Status: None   Collection Time: 09/18/15  9:04 PM  Result Value Ref Range   WBC 9.4 3.6 - 11.0 K/uL   RBC 4.49 3.80 - 5.20 MIL/uL   Hemoglobin 12.8 12.0 - 16.0 g/dL   HCT 39.3 35.0 - 47.0 %   MCV 87.4 80.0 - 100.0 fL   MCH 28.6 26.0 - 34.0 pg   MCHC 32.7 32.0 - 36.0 g/dL   RDW 14.5 11.5 - 14.5 %   Platelets 245 150 - 440 K/uL  Comprehensive metabolic panel     Status: Abnormal   Collection Time: 09/18/15  9:04 PM  Result Value Ref Range   Sodium 138 135 - 145 mmol/L   Potassium 2.9 (LL) 3.5 - 5.1 mmol/L    Comment: CRITICAL RESULT CALLED TO, READ BACK BY AND VERIFIED WITH  RACHEL HAYDEN AT 2139 09/18/15 SDR    Chloride 101 101 - 111 mmol/L   CO2 28 22 - 32 mmol/L   Glucose, Bld  106 (H) 65 - 99 mg/dL   BUN 14 6 - 20 mg/dL   Creatinine, Ser 0.59 0.44 - 1.00 mg/dL   Calcium 9.5 8.9 - 10.3 mg/dL   Total Protein 7.8 6.5 - 8.1 g/dL   Albumin 4.5 3.5 - 5.0 g/dL   AST 12 (L) 15 - 41 U/L   ALT 14 14 - 54 U/L   Alkaline Phosphatase  67 38 - 126 U/L   Total Bilirubin 0.7 0.3 - 1.2 mg/dL   GFR calc non Af Amer >60 >60 mL/min   GFR calc Af Amer >60 >60 mL/min    Comment: (NOTE) The eGFR has been calculated using the CKD EPI equation. This calculation has not been validated in all clinical situations. eGFR's persistently <60 mL/min signify possible Chronic Kidney Disease.    Anion gap 9 5 - 15  Troponin I     Status: None   Collection Time: 09/18/15  9:04 PM  Result Value Ref Range   Troponin I <0.03 <0.031 ng/mL    Comment:        NO INDICATION OF MYOCARDIAL INJURY.   Lipase, blood     Status: None   Collection Time: 09/18/15  9:04 PM  Result Value Ref Range   Lipase 29 11 - 51 U/L  Urinalysis complete, with microscopic ([facility] only)     Status: Abnormal   Collection Time: 09/18/15  9:04 PM  Result Value Ref Range   Color, Urine AMBER (A) YELLOW   APPearance HAZY (A) CLEAR   Glucose, UA NEGATIVE NEGATIVE mg/dL   Bilirubin Urine NEGATIVE NEGATIVE   Ketones, ur 2+ (A) NEGATIVE mg/dL   Specific Gravity, Urine 1.032 (H) 1.005 - 1.030   Hgb urine dipstick 3+ (A) NEGATIVE   pH 6.0 5.0 - 8.0   Protein, ur 100 (A) NEGATIVE mg/dL   Nitrite NEGATIVE NEGATIVE   Leukocytes, UA NEGATIVE NEGATIVE   RBC / HPF 0-5 0 - 5 RBC/hpf   WBC, UA 6-30 0 - 5 WBC/hpf   Bacteria, UA NONE SEEN NONE SEEN   Squamous Epithelial / LPF 6-30 (A) NONE SEEN   Mucous PRESENT   TSH     Status: None   Collection Time: 09/18/15  9:04 PM  Result Value Ref Range   TSH 0.592 0.350 - 4.500 uIU/mL  Hemoglobin A1c     Status: None   Collection Time: 09/18/15  9:04 PM  Result Value Ref Range   Hgb A1c MFr Bld 5.6 4.0 - 6.0 %  Pregnancy, urine POC     Status: None   Collection Time: 09/18/15   9:26 PM  Result Value Ref Range   Preg Test, Ur NEGATIVE NEGATIVE    Comment:        THE SENSITIVITY OF THIS METHODOLOGY IS >24 mIU/mL   Magnesium     Status: None   Collection Time: 09/19/15  3:27 PM  Result Value Ref Range   Magnesium 2.2 1.7 - 2.4 mg/dL  Potassium     Status: None   Collection Time: 09/19/15  3:27 PM  Result Value Ref Range   Potassium 4.2 3.5 - 5.1 mmol/L    Imaging Dg Chest 2 View  09/07/2015  CLINICAL DATA:  Pain, primarily upper abdomen, following endoscopy 2 days prior EXAM: CHEST  2 VIEW COMPARISON:  August 03, 2015 FINDINGS: Lungs are clear. Heart size and pulmonary vascularity are normal. No adenopathy. No bone lesions. No pneumothorax or pneumomediastinum. No pneumoperitoneum is appreciable on this study. IMPRESSION: No abnormality noted. Electronically Signed   By: Lowella Grip III M.D.   On: 09/07/2015 07:18   Ct Angio Chest Pe W/cm &/or Wo Cm  09/19/2015  CLINICAL DATA:  Right upper quadrant abdominal pain, right chest pain. EXAM: CT ANGIOGRAPHY CHEST CT ABDOMEN AND PELVIS WITH CONTRAST TECHNIQUE: Multidetector CT imaging of the chest was performed using the standard protocol during bolus administration of intravenous  contrast. Multiplanar CT image reconstructions and MIPs were obtained to evaluate the vascular anatomy. Multidetector CT imaging of the abdomen and pelvis was performed using the standard protocol during bolus administration of intravenous contrast. CONTRAST:  1 OMNIPAQUE IOHEXOL 300 MG/ML SOLN, 129m OMNIPAQUE IOHEXOL 350 MG/ML SOLN COMPARISON:  MRI of the abdomen 09/17/2015. FINDINGS: CTA CHEST FINDINGS There is no evidence of pulmonary embolus. There is no evidence of significant focal lung parenchymal consolidation, pleural effusion or pneumothorax. No masses are identified ; no abnormal focal contrast enhancement is seen. Bilateral mild hypoventilatory changes are seen in the lung bases. No axillary lymphadenopathy is seen. The visualized  portions of the thyroid gland are unremarkable in appearance. No acute osseous abnormalities are seen. CT ABDOMEN and PELVIS FINDINGS Right inferior hepatic lesions seen on MRI and characterized as probable focal nodular hyperplasia is seen. The spleen, adrenals, pancreas, kidneys are unremarkable. There is no evidence of bowel obstruction, enteritis, colitis, diverticulitis, nor appendicitis. The appendix is identified and is unremarkable. A moderate amount of fecal retention is appreciated within the colon. There is no evidence of abdominal aortic aneurysm. The celiac, SMA, IMA, portal vein, SMV are opacified. There is no evidence of abdominal or pelvic free fluid, loculated fluid collections, masses, nor adenopathy scant mural calcifications identified within the aorta and mesenteric vessels. There is no evidence of abdominal wall nor inguinal hernia. Normal appearance uterus and ovaries with IUD in place. There is no evidence of aggressive appearing osseous lesions. Review of the MIP images confirms the above findings. IMPRESSION: No evidence of pulmonary embolus. No evidence of acute abnormalities within the chest, abdomen or pelvis. Liver lesion better visualized on previous MRI, performed 2 days ago, was determined to be likely benign. Electronically Signed   By: DFidela SalisburyM.D.   On: 09/19/2015 16:44   Mr Abdomen W Wo Contrast  09/17/2015  CLINICAL DATA:  Subsequent encounter for 2.4 cm right hepatic lobe lesion. EXAM: MRI ABDOMEN WITHOUT AND WITH CONTRAST TECHNIQUE: Multiplanar multisequence MR imaging of the abdomen was performed both before and after the administration of intravenous contrast. CONTRAST:  8 cc Eovist COMPARISON:  Ultrasound exam from 09/03/2015. CT scan from 07/05/2015. MRI from 02/04/2014. FINDINGS: Lower chest:  Unremarkable. Hepatobiliary: 7 mm subcapsular lesion in the inferior right liver (image 18 series 4) unchanged in the interval. 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 Mild diffuse fatty deposition within the liver is again noted. Previously described hypervascular lesion in the inferior right liver again is identified and is most evident on arterial phase imaging (see image 34 series 11). 1.9 cm maximum diameter today is stable compared to 2.1 cm previously. This lesion is essentially isointense to liver parenchyma on T2 weighted imaging although there may be some subtle central hyperintensity. Subtle hypo intensity is seen within the lesion on T1 weighted precontrast imaging. 20 minutes delayed hepatocytes phase imaging shows retention of contrast within the peripheral aspect of the lesion. There is no evidence for gallstones, gallbladder wall thickening, or pericholecystic fluid. No intrahepatic or extrahepatic biliary dilation. Pancreas: No focal mass lesion. No dilatation of the main duct. No intraparenchymal cyst. No peripancreatic edema. Spleen: No splenomegaly. No focal mass lesion. Adrenals/Urinary Tract: No adrenal nodule or mass. Kidneys are unremarkable. Stomach/Bowel: Stomach is nondistended. No gastric wall thickening. No evidence of outlet obstruction. Duodenum is normally positioned as is the ligament of Treitz. Vascular/Lymphatic: No abdominal aortic aneurysm. There is no gastrohepatic or hepatoduodenal ligament lymphadenopathy. No intraperitoneal or retroperitoneal lymphadenopy.  Other: No intraperitoneal free fluid. Musculoskeletal: No abnormal marrow signal within the visualized bony anatomy. IMPRESSION: 1. Dominant inferior right liver lesion is stable over the 19 months since prior MRI. Interval stability is most consistent with a benign process. On today's study, there does appear to be retention of contrast peripherally in the lesion on delayed hepatocyte phase imaging. Together, imaging features remain most suggestive of focal nodular hyperplasia. 2. Otherwise stable exam.  No new or progressive findings. Electronically  Signed   By: Misty Stanley M.D.   On: 09/17/2015 11:44   US Transvaginal Non-ob  09/19/2015  CLINICAL DATA:  Breakthrough bleeding with IUD in place. Pelvic pain for 1 week. EXAM: TRANSABDOMINAL AND TRANSVAGINAL ULTRASOUND OF PELVIS TECHNIQUE: Both transabdominal and transvaginal ultrasound examinations of the pelvis were performed. Transabdominal technique was performed for global imaging of the pelvis including uterus, ovaries, adnexal regions, and pelvic cul-de-sac. It was necessary to proceed with endovaginal exam following the transabdominal exam to visualize the uterus and endometrium. COMPARISON:  CT of the abdomen and pelvis from the same day. FINDINGS: Uterus Measurements: 9.4 x 3.9 x 5.9 cm, within normal limits. No fibroids or other mass visualized. Endometrium Thickness: 8 mm, within normal limits. The IUD is identified and in satisfactory position. No focal abnormality visualized. Right ovary Measurements: 4.3 x 1.9 x 2.5 cm, within normal limits. Normal appearance/no adnexal mass. Left ovary Measurements: 3.3 x 3.3 x 2.0 cm, within normal limits. Normal appearance/no adnexal mass. Other findings No free fluid. IMPRESSION: 1. IUD identified in in satisfactory position. 2. Otherwise normal appearance of the uterus and ovaries. Electronically Signed   By: San Morelle M.D.   On: 09/19/2015 17:12   US Pelvis Complete  09/19/2015  CLINICAL DATA:  Breakthrough bleeding with IUD in place. Pelvic pain for 1 week. EXAM: TRANSABDOMINAL AND TRANSVAGINAL ULTRASOUND OF PELVIS TECHNIQUE: Both transabdominal and transvaginal ultrasound examinations of the pelvis were performed. Transabdominal technique was performed for global imaging of the pelvis including uterus, ovaries, adnexal regions, and pelvic cul-de-sac. It was necessary to proceed with endovaginal exam following the transabdominal exam to visualize the uterus and endometrium. COMPARISON:  CT of the abdomen and pelvis from the same day.  FINDINGS: Uterus Measurements: 9.4 x 3.9 x 5.9 cm, within normal limits. No fibroids or other mass visualized. Endometrium Thickness: 8 mm, within normal limits. The IUD is identified and in satisfactory position. No focal abnormality visualized. Right ovary Measurements: 4.3 x 1.9 x 2.5 cm, within normal limits. Normal appearance/no adnexal mass. Left ovary Measurements: 3.3 x 3.3 x 2.0 cm, within normal limits. Normal appearance/no adnexal mass. Other findings No free fluid. IMPRESSION: 1. IUD identified in in satisfactory position. 2. Otherwise normal appearance of the uterus and ovaries. Electronically Signed   By: San Morelle M.D.   On: 09/19/2015 17:12   Ct Abdomen Pelvis W Contrast  09/19/2015  CLINICAL DATA:  Right upper quadrant abdominal pain, right chest pain. EXAM: CT ANGIOGRAPHY CHEST CT ABDOMEN AND PELVIS WITH CONTRAST TECHNIQUE: Multidetector CT imaging of the chest was performed using the standard protocol during bolus administration of intravenous contrast. Multiplanar CT image reconstructions and MIPs were obtained to evaluate the vascular anatomy. Multidetector CT imaging of the abdomen and pelvis was performed using the standard protocol during bolus administration of intravenous contrast. CONTRAST:  1 OMNIPAQUE IOHEXOL 300 MG/ML SOLN, 165m OMNIPAQUE IOHEXOL 350 MG/ML SOLN COMPARISON:  MRI of the abdomen 09/17/2015. FINDINGS: CTA CHEST FINDINGS There is no evidence of pulmonary embolus. There  is no evidence of significant focal lung parenchymal consolidation, pleural effusion or pneumothorax. No masses are identified ; no abnormal focal contrast enhancement is seen. Bilateral mild hypoventilatory changes are seen in the lung bases. No axillary lymphadenopathy is seen. The visualized portions of the thyroid gland are unremarkable in appearance. No acute osseous abnormalities are seen. CT ABDOMEN and PELVIS FINDINGS Right inferior hepatic lesions seen on MRI and characterized as  probable focal nodular hyperplasia is seen. The spleen, adrenals, pancreas, kidneys are unremarkable. There is no evidence of bowel obstruction, enteritis, colitis, diverticulitis, nor appendicitis. The appendix is identified and is unremarkable. A moderate amount of fecal retention is appreciated within the colon. There is no evidence of abdominal aortic aneurysm. The celiac, SMA, IMA, portal vein, SMV are opacified. There is no evidence of abdominal or pelvic free fluid, loculated fluid collections, masses, nor adenopathy scant mural calcifications identified within the aorta and mesenteric vessels. There is no evidence of abdominal wall nor inguinal hernia. Normal appearance uterus and ovaries with IUD in place. There is no evidence of aggressive appearing osseous lesions. Review of the MIP images confirms the above findings. IMPRESSION: No evidence of pulmonary embolus. No evidence of acute abnormalities within the chest, abdomen or pelvis. Liver lesion better visualized on previous MRI, performed 2 days ago, was determined to be likely benign. Electronically Signed   By: Fidela Salisbury M.D.   On: 09/19/2015 16:44   Dg Chest Portable 1 View  09/18/2015  CLINICAL DATA:  Chest pain EXAM: PORTABLE CHEST 1 VIEW COMPARISON:  09/07/2015 chest radiograph. FINDINGS: Stable cardiomediastinal silhouette with normal heart size. No pneumothorax. No pleural effusion. Clear lungs, with no focal lung consolidation and no pulmonary edema. IMPRESSION: No active disease. Electronically Signed   By: Ilona Sorrel M.D.   On: 09/18/2015 21:03   US Abdomen Limited Ruq  09/03/2015  CLINICAL DATA:  Epigastric pain, nausea and vomiting EXAM: US ABDOMEN LIMITED - RIGHT UPPER QUADRANT COMPARISON:  07/22/2015 FINDINGS: Gallbladder: Non mobile non shadowing echogenic focus of the posterior gallbladder wall, likely a small polyp measuring 7 x 13 mm. No conclusive calculi. No gallbladder wall thickening or pericholecystic fluid. No  tenderness over the gallbladder Common bile duct: Diameter: 3.0 mm Liver: Indeterminate 2.4 cm right lobe hypoechoic lesion, unchanged from 07/22/2015. MRI pre and post-contrast recommended for characterization. IMPRESSION: 1. Probable gallbladder polyp.  No conclusive calculi. 2. Indeterminate right lobe liver lesion. Recommend MRI pre and postcontrast for characterization. Electronically Signed   By:  [place] M.D.   On: 09/03/2015 04:14    Assessment: 39 y.o. [id number] with abnormal uterine bleeding and paragard copper IUD  Plan: 1) Abdominal pain - if proper IUD placement confirmed by transvaginal ultrasound I do not see a plausible gynecologic etiology for the patient's epigastric pain.  MRI obtained this admission did not show evidence of IUD within tthe abdominal cavity ruling out uterine perforation and migration of the IUD which has an incidence of approximately 10/998 placements.   CT abdomen and pelvis 07/05/2015 showed intrauterine placement of the IUD and some uterine leiomyomata, the device seem in similar position on CT A/P obtained today. At a recent admission 09/07/2015 the patient was negative for gonorrhea and chlamydia making PID an unlikely etiology.  There can culture negative PID and with PID there may be perihepatic involvement with but I would expect a greater component of lower quadrant painb +/- finding of tubo-ovarian abscess, fevers, or elevation in white count accompanying the patient's symptoms.  Ectopic  pregnancy is more common if pregnancy occurs with an IUD in place but given negative UPT on admission and the relative rarity of conceiving with IUD's this also does not seem a plausible cause for the patient's current symptoms and given states LMP of 09/03/2015.    2) Abnormal uterine bleeding - may have several etiologies.  Anovulatory cycle, patient fibroid uterus with at least one of these appearing to be submucosal, and her Paragard IUD.  The Paragard IUD while great  option for contraception is associated with slightly heavier periods and more dysmenorrhea than its drug eluting counterparts Mirena, [place], or [place].  If she does not have recurrent issues with these symptoms I would advise continuation, she can get the IUD removed and replaced with on of the other three mention or switch her method of contraception entirely.  Her H&H is stable from prior visit on 09/07/2015.  Ultrasound pending at time I saw the patient, images have been uploaded and reviewed, showing IUD in proper position within the uterine cavity, normal appearing ovaries with several small follicles, no appreciable fibroids.   3) Disposition - per primary team.  I see no gynecologic issues that warrant further inpatient admission at present.  She may follow up outpatient as needed for any gynecologic concerns.

## 2015-09-19 NOTE — Progress Notes (Signed)
MD notified of pt still complaining of pain. States that the toradol worked better than the morphine. MD to place orders

## 2015-09-19 NOTE — Discharge Instructions (Signed)

## 2015-09-19 NOTE — Consult Note (Signed)
I spoke to patient briefly that her frequent health care visits to get narcotic pain medicine was excessive and she had been scoped recently and nothing of significance that might require narcotic pain medicine was ever found.  I will sign off case.  If anything unexpected is seen on any studies please let me know.

## 2015-09-19 NOTE — ED Notes (Signed)
Dr Diamond at bedside. 

## 2015-09-19 NOTE — Progress Notes (Signed)
Pt seen and examined. Discussed at length regarding her pain, work up, multiple tests and hospital visits. Also discussed with Dr. Jerre SimonEliott. Narcotic seeking?  Check CT chest for PE. And CT abd/pelvis. Gyn consult pending for menorrhagia but i dont think that is causing her epigastric pain.  Pt had CT abd, US, MRI abd, EGD.  D/C on PPI if CT normal and GYN clears her.

## 2015-09-19 NOTE — Care Management Note (Signed)
Case Management Note  Patient Details  Name: Geronimo RunningSamantha Lee Bayman MRN: [id number] Date of Birth: 09/11/1976  Subjective/Objective:    39yo Ms Arnell AsalSamantha Plotts was admitted to an Observation bed 09/18/15 per complaints of severe bilateral upper abdominal pain and vomiting. Has been seen [facility] and [facility] for this complaint. Hx of gastritis, gastric ulcers and H.pylori ulcers. PCP=Melinda Lada. Goes to [employer] [facility].Hx of non-compliance with meds. Resides with her Mother and sister. Mother provides transportation.   Anticipate discharge to home on Monday. Case management will follow for discharge planning.                 Action/Plan:   Expected Discharge Date:                  Expected Discharge Plan:     In-House Referral:     Discharge planning Services     Post Acute Care Choice:    Choice offered to:     DME Arranged:    DME Agency:     HH Arranged:    HH Agency:     Status of Service:     Medicare Important Message Given:    Date Medicare IM Given:    Medicare IM give by:    Date Additional Medicare IM Given:    Additional Medicare Important Message give by:     If discussed at Long Length of Stay Meetings, dates discussed:    Additional Comments:  Shaquila Sigman A, RN 09/19/2015, 5:05 PM

## 2015-09-19 NOTE — Progress Notes (Signed)
Pt alert and oriented x4, no complaints of pain or discomfort.  Bed in low position, call bell within reach.  Bed alarms on and functioning.  Assessment done and charted.  Discharge: Pt d/c from room via wheelchair, Family member with the pt. Discharge instructions given to the patient and family members.  No questions from pt, reintegrated to the pt to call and make appt to see her psychiatrist and PCP in one week.. Pt dressed in street clothes and left with discharge papers and prescriptions in hand. IV d/ced,

## 2015-09-20 LAB — C-REACTIVE PROTEIN: CRP: <0.5 mg/dL (ref <1.0)

## 2015-09-20 NOTE — Discharge Summary (Addendum)
[facility] [place] at [facility]   PATIENT NAME: Laurie Frederick    MR#:  [id number]  DATE OF BIRTH:  Jul 01, 1976  DATE OF ADMISSION:  09/18/2015 ADMITTING PHYSICIAN: Arnaldo Natal, MD  DATE OF DISCHARGE: 09/19/2015  6:48 PM  PRIMARY CARE PHYSICIAN: Baruch Gouty, MD    ADMISSION DIAGNOSIS:  Upper abdominal pain [R10.10] Intractable vomiting with nausea, vomiting of unspecified type [R11.10]  DISCHARGE DIAGNOSIS:  Active Problems:   Intractable nausea and vomiting   SECONDARY DIAGNOSIS:   Past Medical History  Diagnosis Date  . Sleep apnea   . GERD (gastroesophageal reflux disease)   . Hyperlipidemia   . Depression   . Anxiety   . PTSD (post-traumatic stress disorder)   . Obesity   . Anemia   . Vitamin D deficiency   . Nontoxic uninodular goiter   . Multiple gastric ulcers   . H pylori ulcer      ADMITTING HISTORY  Chief Complaint: Abdominal pain HPI: Patient presents to the emergency department again for the third day in a row complaining of abdominal pain. This is a chronic complaint that the patient has been having for at least 3 months. She has seen multiple specialists and had multiple endoscopies as well as imaging to find the source of her pain. He admits that she was diagnosed with H. pylori at one point but did not complete the clarithromycin portion of her triple therapy. He admits to some nausea that will not go away. She states her abdominal pain is so bad that she cannot sleep and that she demands some relief. She also states that she's had some vaginal bleeding that is different than her period. She is concerned about her Peri-Guard IUD. Due to intractable nausea emergency department staff called for admission.   HOSPITAL COURSE:   Patient was admitted onto the medical floor. IV pain medications and protonic started. Patient was seen by Dr. Mechele Collin of GI and did not feel like a repeat endoscopy was needed. Narcotic seeking  behavior was suspected. CT scan of the chest showed no pulmonary embolism or masses. CT scan of the abdomen was repeated which showed a stable liver hemangioma. Nothing acute. Explained to patient that there is no left threatening disease or organic cause for the pain found. Possibility of gastritis which would be treated with PPI.  I also advised patient to follow-up with her psychiatrist for somatization.  Patient has had multiple CT scans of the abdomen and chest, ultrasound, MRI of the abdomen, EGD. She was also adamant that her pain was coming from an IUD for which gynecology has seen the patient and transferred vaginal ultrasound showed good placement.  Prior to discharge patient's pain is well controlled and she has verbalized understanding. All questions answered.   CONSULTS OBTAINED:  Treatment Team:  Scot Jun, MD Vena Austria, MD  DRUG ALLERGIES:  No Known Allergies  DISCHARGE MEDICATIONS:   Discharge Medication List as of 09/19/2015  5:58 PM    START taking these medications   Details  HYDROcodone-acetaminophen (NORCO) 7.5-325 MG tablet Take 1 tablet by mouth every 6 (six) hours as needed for moderate pain., Starting 09/19/2015, Until Discontinued, Print    pantoprazole (PROTONIX) 40 MG tablet Take 1 tablet (40 mg total) by mouth 2 (two) times daily before a meal., Starting 09/19/2015, Until Discontinued, Print      CONTINUE these medications which have CHANGED   Details  ondansetron (ZOFRAN ODT) 4 MG disintegrating tablet Take 1  tablet (4 mg total) by mouth every 6 (six) hours as needed for nausea or vomiting., Starting 09/19/2015, Until Discontinued, Print      CONTINUE these medications which have NOT CHANGED   Details  alprazolam (XANAX) 2 MG tablet Take 2 mg by mouth 2 (two) times daily as needed for anxiety. , Until Discontinued, Historical Med    citalopram (CELEXA) 40 MG tablet Take 40 mg by mouth at bedtime., Until Discontinued, Historical Med     QUEtiapine (SEROQUEL) 50 MG tablet Take 100 mg by mouth at bedtime., Until Discontinued, Historical Med    zolpidem (AMBIEN) 10 MG tablet Take 10 mg by mouth at bedtime., Until Discontinued, Historical Med      STOP taking these medications     potassium chloride (K-DUR) 10 MEQ tablet      promethazine (PHENERGAN) 12.5 MG tablet          Today    VITAL SIGNS:  Blood pressure 124/74, pulse 92, temperature 98.8 F (37.1 C), temperature source Oral, resp. rate 16, height 5\' 3"  (1.6 m), weight 73.483 kg (162 lb), last menstrual period 09/03/2015, SpO2 100 %.  I/O:   Intake/Output Summary (Last 24 hours) at 09/20/15 1518 Last data filed at 09/19/15 1700  Gross per 24 hour  Intake    240 ml  Output      0 ml  Net    240 ml    PHYSICAL EXAMINATION:  Physical Exam  GENERAL:  39 y.o.-year-old patient lying in the bed with no acute distress.  LUNGS: Normal breath sounds bilaterally, no wheezing, rales,rhonchi or crepitation. No use of accessory muscles of respiration.  CARDIOVASCULAR: S1, S2 normal. No murmurs, rubs, or gallops.  ABDOMEN: Soft, non-tender, non-distended. Bowel sounds present. No organomegaly or mass.  NEUROLOGIC: Moves all 4 extremities. PSYCHIATRIC: The patient is alert and oriented x 3.  SKIN: No obvious rash, lesion, or ulcer.   DATA REVIEW:   CBC  Recent Labs Lab 09/18/15 2104  WBC 9.4  HGB 12.8  HCT 39.3  PLT 245    Chemistries   Recent Labs Lab 09/18/15 2104 09/19/15 1527  NA 138  --   K 2.9* 4.2  CL 101  --   CO2 28  --   GLUCOSE 106*  --   BUN 14  --   CREATININE 0.59  --   CALCIUM 9.5  --   MG  --  2.2  AST 12*  --   ALT 14  --   ALKPHOS 67  --   BILITOT 0.7  --     Cardiac Enzymes  Recent Labs Lab 09/18/15 2104  TROPONINI <0.03    Microbiology Results  Results for orders placed or performed during the hospital encounter of 09/07/15  Chlamydia/NGC rt PCR     Status: None   Collection Time: 09/07/15 12:45 PM   Result Value Ref Range Status   Specimen source GC/Chlam ENDOCERVICAL  Final   Chlamydia Tr NOT DETECTED NOT DETECTED Final   N gonorrhoeae NOT DETECTED NOT DETECTED Final    Comment: (NOTE) 100  This methodology has not been evaluated in pregnant women or in 200  patients with a history of hysterectomy. 300 400  This methodology will not be performed on patients less than 5414  years of age.   Wet prep, genital     Status: Abnormal   Collection Time: 09/07/15 12:45 PM  Result Value Ref Range Status   Yeast Wet Prep HPF POC NONE SEEN  NONE SEEN Final   Trich, Wet Prep NONE SEEN NONE SEEN Final   Clue Cells Wet Prep HPF POC POSITIVE (A) NONE SEEN Final   WBC, Wet Prep HPF POC FEW (A) NONE SEEN Final    RADIOLOGY:  Ct Angio Chest Pe W/cm &/or Wo Cm  09/19/2015  CLINICAL DATA:  Right upper quadrant abdominal pain, right chest pain. EXAM: CT ANGIOGRAPHY CHEST CT ABDOMEN AND PELVIS WITH CONTRAST TECHNIQUE: Multidetector CT imaging of the chest was performed using the standard protocol during bolus administration of intravenous contrast. Multiplanar CT image reconstructions and MIPs were obtained to evaluate the vascular anatomy. Multidetector CT imaging of the abdomen and pelvis was performed using the standard protocol during bolus administration of intravenous contrast. CONTRAST:  1 OMNIPAQUE IOHEXOL 300 MG/ML SOLN, OMNIPAQUE IOHEXOL 350 MG/ML SOLN COMPARISON:  MRI of the abdomen 09/17/2015. FINDINGS: CTA CHEST FINDINGS There is no evidence of pulmonary embolus. There is no evidence of significant focal lung parenchymal consolidation, pleural effusion or pneumothorax. No masses are identified ; no abnormal focal contrast enhancement is seen. Bilateral mild hypoventilatory changes are seen in the lung bases. No axillary lymphadenopathy is seen. The visualized portions of the thyroid gland are unremarkable in appearance. No acute osseous abnormalities are seen. CT ABDOMEN and PELVIS FINDINGS  Right inferior hepatic lesions seen on MRI and characterized as probable focal nodular hyperplasia is seen. The spleen, adrenals, pancreas, kidneys are unremarkable. There is no evidence of bowel obstruction, enteritis, colitis, diverticulitis, nor appendicitis. The appendix is identified and is unremarkable. A moderate amount of fecal retention is appreciated within the colon. There is no evidence of abdominal aortic aneurysm. The celiac, SMA, IMA, portal vein, SMV are opacified. There is no evidence of abdominal or pelvic free fluid, loculated fluid collections, masses, nor adenopathy scant mural calcifications identified within the aorta and mesenteric vessels. There is no evidence of abdominal wall nor inguinal hernia. Normal appearance uterus and ovaries with IUD in place. There is no evidence of aggressive appearing osseous lesions. Review of the MIP images confirms the above findings. IMPRESSION: No evidence of pulmonary embolus. No evidence of acute abnormalities within the chest, abdomen or pelvis. Liver lesion better visualized on previous MRI, performed 2 days ago, was determined to be likely benign. Electronically Signed   By: Ted Mcalpine M.D.   On: 09/19/2015 16:44   US Transvaginal Non-ob  09/19/2015  CLINICAL DATA:  Breakthrough bleeding with IUD in place. Pelvic pain for 1 week. EXAM: TRANSABDOMINAL AND TRANSVAGINAL ULTRASOUND OF PELVIS TECHNIQUE: Both transabdominal and transvaginal ultrasound examinations of the pelvis were performed. Transabdominal technique was performed for global imaging of the pelvis including uterus, ovaries, adnexal regions, and pelvic cul-de-sac. It was necessary to proceed with endovaginal exam following the transabdominal exam to visualize the uterus and endometrium. COMPARISON:  CT of the abdomen and pelvis from the same day. FINDINGS: Uterus Measurements: 9.4 x 3.9 x 5.9 cm, within normal limits. No fibroids or other mass visualized. Endometrium Thickness: 8  mm, within normal limits. The IUD is identified and in satisfactory position. No focal abnormality visualized. Right ovary Measurements: 4.3 x 1.9 x 2.5 cm, within normal limits. Normal appearance/no adnexal mass. Left ovary Measurements: 3.3 x 3.3 x 2.0 cm, within normal limits. Normal appearance/no adnexal mass. Other findings No free fluid. IMPRESSION: 1. IUD identified in in satisfactory position. 2. Otherwise normal appearance of the uterus and ovaries. Electronically Signed   By: Marin Roberts M.D.   On: 09/19/2015 17:12  US Pelvis Complete  09/19/2015  CLINICAL DATA:  Breakthrough bleeding with IUD in place. Pelvic pain for 1 week. EXAM: TRANSABDOMINAL AND TRANSVAGINAL ULTRASOUND OF PELVIS TECHNIQUE: Both transabdominal and transvaginal ultrasound examinations of the pelvis were performed. Transabdominal technique was performed for global imaging of the pelvis including uterus, ovaries, adnexal regions, and pelvic cul-de-sac. It was necessary to proceed with endovaginal exam following the transabdominal exam to visualize the uterus and endometrium. COMPARISON:  CT of the abdomen and pelvis from the same day. FINDINGS: Uterus Measurements: 9.4 x 3.9 x 5.9 cm, within normal limits. No fibroids or other mass visualized. Endometrium Thickness: 8 mm, within normal limits. The IUD is identified and in satisfactory position. No focal abnormality visualized. Right ovary Measurements: 4.3 x 1.9 x 2.5 cm, within normal limits. Normal appearance/no adnexal mass. Left ovary Measurements: 3.3 x 3.3 x 2.0 cm, within normal limits. Normal appearance/no adnexal mass. Other findings No free fluid. IMPRESSION: 1. IUD identified in in satisfactory position. 2. Otherwise normal appearance of the uterus and ovaries. Electronically Signed   By: Marin Roberts M.D.   On: 09/19/2015 17:12   Ct Abdomen Pelvis W Contrast  09/19/2015  CLINICAL DATA:  Right upper quadrant abdominal pain, right chest pain. EXAM: CT  ANGIOGRAPHY CHEST CT ABDOMEN AND PELVIS WITH CONTRAST TECHNIQUE: Multidetector CT imaging of the chest was performed using the standard protocol during bolus administration of intravenous contrast. Multiplanar CT image reconstructions and MIPs were obtained to evaluate the vascular anatomy. Multidetector CT imaging of the abdomen and pelvis was performed using the standard protocol during bolus administration of intravenous contrast. CONTRAST:  1 OMNIPAQUE IOHEXOL 300 MG/ML SOLN, OMNIPAQUE IOHEXOL 350 MG/ML SOLN COMPARISON:  MRI of the abdomen 09/17/2015. FINDINGS: CTA CHEST FINDINGS There is no evidence of pulmonary embolus. There is no evidence of significant focal lung parenchymal consolidation, pleural effusion or pneumothorax. No masses are identified ; no abnormal focal contrast enhancement is seen. Bilateral mild hypoventilatory changes are seen in the lung bases. No axillary lymphadenopathy is seen. The visualized portions of the thyroid gland are unremarkable in appearance. No acute osseous abnormalities are seen. CT ABDOMEN and PELVIS FINDINGS Right inferior hepatic lesions seen on MRI and characterized as probable focal nodular hyperplasia is seen. The spleen, adrenals, pancreas, kidneys are unremarkable. There is no evidence of bowel obstruction, enteritis, colitis, diverticulitis, nor appendicitis. The appendix is identified and is unremarkable. A moderate amount of fecal retention is appreciated within the colon. There is no evidence of abdominal aortic aneurysm. The celiac, SMA, IMA, portal vein, SMV are opacified. There is no evidence of abdominal or pelvic free fluid, loculated fluid collections, masses, nor adenopathy scant mural calcifications identified within the aorta and mesenteric vessels. There is no evidence of abdominal wall nor inguinal hernia. Normal appearance uterus and ovaries with IUD in place. There is no evidence of aggressive appearing osseous lesions. Review of the MIP  images confirms the above findings. IMPRESSION: No evidence of pulmonary embolus. No evidence of acute abnormalities within the chest, abdomen or pelvis. Liver lesion better visualized on previous MRI, performed 2 days ago, was determined to be likely benign. Electronically Signed   By: Ted Mcalpine M.D.   On: 09/19/2015 16:44   Dg Chest Portable 1 View  09/18/2015  CLINICAL DATA:  Chest pain EXAM: PORTABLE CHEST 1 VIEW COMPARISON:  09/07/2015 chest radiograph. FINDINGS: Stable cardiomediastinal silhouette with normal heart size. No pneumothorax. No pleural effusion. Clear lungs, with no focal lung consolidation and no  pulmonary edema. IMPRESSION: No active disease. Electronically Signed   By: Delbert Phenix M.D.   On: 09/18/2015 21:03      Follow up with PCP in 1 week.  Management plans discussed with the patient, family and they are in agreement.  CODE STATUS:   TOTAL TIME TAKING CARE OF THIS PATIENT ON DAY OF DISCHARGE: more than 30 minutes.    Milagros Loll R M.D on 09/20/2015 at 3:18 PM  Between 7am to 6pm - Pager - [phone]  After 6pm go to [email or web address] - password [email or web address]  [facility]  Office  [phone]  CC: Primary care physician; Baruch Gouty, MD     Note: This dictation was prepared with Dragon dictation along with smaller phrase technology. Any transcriptional errors that result from this process are unintentional.

## 2015-09-24 ENCOUNTER — Encounter: Payer: Self-pay | Admitting: Emergency Medicine

## 2015-09-24 ENCOUNTER — Emergency Department
Admission: EM | Admit: 2015-09-24 | Discharge: 2015-09-24 | Disposition: A | Discharge status: Home / Self Care | Payer: Medicaid Other | Source: Home / Self Care | Attending: Emergency Medicine | Admitting: Emergency Medicine

## 2015-09-24 ENCOUNTER — Emergency Department
Admission: EM | Admit: 2015-09-24 | Discharge: 2015-09-24 | Disposition: A | Discharge status: Home / Self Care | Payer: Medicaid Other | Attending: Emergency Medicine | Admitting: Emergency Medicine

## 2015-09-24 DIAGNOSIS — F17210 Nicotine dependence, cigarettes, uncomplicated: Secondary | ICD-10-CM | POA: Insufficient documentation

## 2015-09-24 DIAGNOSIS — G8929 Other chronic pain: Secondary | ICD-10-CM | POA: Insufficient documentation

## 2015-09-24 DIAGNOSIS — Z79899 Other long term (current) drug therapy: Secondary | ICD-10-CM

## 2015-09-24 DIAGNOSIS — R109 Unspecified abdominal pain: Secondary | ICD-10-CM | POA: Insufficient documentation

## 2015-09-24 DIAGNOSIS — R1013 Epigastric pain: Secondary | ICD-10-CM | POA: Insufficient documentation

## 2015-09-24 DIAGNOSIS — R112 Nausea with vomiting, unspecified: Secondary | ICD-10-CM | POA: Diagnosis not present

## 2015-09-24 DIAGNOSIS — K2970 Gastritis, unspecified, without bleeding: Secondary | ICD-10-CM

## 2015-09-24 LAB — COMPREHENSIVE METABOLIC PANEL
ALK PHOS: 69 U/L (ref 38–126)
ALT: 15 U/L (ref 14–54)
AST: 17 U/L (ref 15–41)
Albumin: 4.7 g/dL (ref 3.5–5.0)
Anion gap: 9 (ref 5–15)
BUN: 7 mg/dL (ref 6–20)
CALCIUM: 9.5 mg/dL (ref 8.9–10.3)
CHLORIDE: 104 mmol/L (ref 101–111)
CO2: 27 mmol/L (ref 22–32)
CREATININE: 0.48 mg/dL (ref 0.44–1.00)
Glucose, Bld: 115 mg/dL — ABNORMAL HIGH (ref 65–99)
Potassium: 3.6 mmol/L (ref 3.5–5.1)
Sodium: 140 mmol/L (ref 135–145)
Total Bilirubin: 0.6 mg/dL (ref 0.3–1.2)
Total Protein: 8 g/dL (ref 6.5–8.1)

## 2015-09-24 LAB — CBC
HEMATOCRIT: 37.4 % (ref 35.0–47.0)
HEMOGLOBIN: 12.2 g/dL (ref 12.0–16.0)
MCH: 28.6 pg (ref 26.0–34.0)
MCHC: 32.7 g/dL (ref 32.0–36.0)
MCV: 87.3 fL (ref 80.0–100.0)
PLATELETS: 339 10*3/uL (ref 150–440)
RBC: 4.28 MIL/uL (ref 3.80–5.20)
RDW: 14.8 % — ABNORMAL HIGH (ref 11.5–14.5)
WBC: 13.1 10*3/uL — AB (ref 3.6–11.0)

## 2015-09-24 LAB — LIPASE, BLOOD: LIPASE: 56 U/L — AB (ref 11–51)

## 2015-09-24 MED ORDER — ONDANSETRON 4 MG PO TBDP
4.0000 mg | ORAL_TABLET | Freq: Once | ORAL | Status: AC
  Administered 2015-09-24: 4 mg via ORAL
  Filled 2015-09-24: qty 1

## 2015-09-24 MED ORDER — GI COCKTAIL ~~LOC~~
30.0000 mL | Freq: Once | ORAL | Status: AC
  Administered 2015-09-24: 30 mL via ORAL
  Filled 2015-09-24: qty 30

## 2015-09-24 MED ORDER — HALOPERIDOL LACTATE 5 MG/ML IJ SOLN
5.0000 mg | Freq: Once | INTRAMUSCULAR | Status: AC
  Administered 2015-09-24: 5 mg via INTRAVENOUS
  Filled 2015-09-24: qty 1

## 2015-09-24 MED ORDER — ONDANSETRON HCL 4 MG PO TABS: 4.0000 mg | ORAL_TABLET | Freq: Three times a day (TID) | ORAL | Status: DC | PRN

## 2015-09-24 MED ORDER — ONDANSETRON 4 MG PO TBDP
ORAL_TABLET | ORAL | Status: AC
  Filled 2015-09-24: qty 1

## 2015-09-24 MED ORDER — ONDANSETRON HCL 4 MG/2ML IJ SOLN
INTRAMUSCULAR | Status: AC
  Administered 2015-09-24: 4 mg via INTRAVENOUS
  Filled 2015-09-24: qty 2

## 2015-09-24 MED ORDER — GI COCKTAIL ~~LOC~~: 30.0000 mL | Freq: Once | ORAL | Status: DC

## 2015-09-24 MED ORDER — ONDANSETRON HCL 4 MG/2ML IJ SOLN
4.0000 mg | Freq: Once | INTRAMUSCULAR | Status: AC
  Administered 2015-09-24: 4 mg via INTRAVENOUS

## 2015-09-24 NOTE — ED Notes (Signed)
Pt. Used phone to call family member to pick them up in waiting room.

## 2015-09-24 NOTE — ED Notes (Signed)
Pt. Here via EMS for upper abdominal pain that started around 7 pm yesterday. Pt. States vomiting multiple times last night. Pt. States on going upper abdominal pain.

## 2015-09-24 NOTE — Discharge Instructions (Signed)

## 2015-09-24 NOTE — Discharge Instructions (Signed)
You were re-evaluated for nausea and upper abdominal pain which is suspect is due to gastritis and acute exacerbation of chronic abdominal pain.  Return to ER for any new or worsening abdominal pain, bloody vomiting, black or bloody stools, fever, or any other symptoms concerning to you.  We discussed trying an "elimination diet" such as "Whole 30" which you can look up online.  You may also look up "Functional Medicine" physician or nutritionist for help looking into another avenue as to why you are having chronic abdominal pain for which no source has yet been found.   Abdominal Pain, Adult Many things can cause abdominal pain. Usually, abdominal pain is not caused by a disease and will improve without treatment. It can often be observed and treated at home. Your health care provider will do a physical exam and possibly order blood tests and X-rays to help determine the seriousness of your pain. However, in many cases, more time must pass before a clear cause of the pain can be found. Before that point, your health care provider may not know if you need more testing or further treatment. HOME CARE INSTRUCTIONS Monitor your abdominal pain for any changes. The following actions may help to alleviate any discomfort you are experiencing:  Only take over-the-counter or prescription medicines as directed by your health care provider.  Do not take laxatives unless directed to do so by your health care provider.  Try a clear liquid diet (broth, tea, or water) as directed by your health care provider. Slowly move to a bland diet as tolerated. SEEK MEDICAL CARE IF:  You have unexplained abdominal pain.  You have abdominal pain associated with nausea or diarrhea.  You have pain when you urinate or have a bowel movement.  You experience abdominal pain that wakes you in the night.  You have abdominal pain that is worsened or improved by eating food.  You have abdominal pain that is worsened with  eating fatty foods.  You have a fever. SEEK IMMEDIATE MEDICAL CARE IF:  Your pain does not go away within 2 hours.  You keep throwing up (vomiting).  Your pain is felt only in portions of the abdomen, such as the right side or the left lower portion of the abdomen.  You pass bloody or black tarry stools. MAKE SURE YOU:  Understand these instructions.  Will watch your condition.  Will get help right away if you are not doing well or get worse.   This information is not intended to replace advice given to you by your health care provider. Make sure you discuss any questions you have with your health care provider.   Document Released: 07/26/2005 Document Revised: 07/07/2015 Document Reviewed: 06/25/2013 Elsevier Interactive Patient Education [employer]2016 [employer]

## 2015-09-24 NOTE — ED Provider Notes (Signed)
[facility] Emergency Department Provider Note   ____________________________________________  Time seen:  I have reviewed the triage vital signs and the triage nursing note.  HISTORY  Chief Complaint Abdominal Pain   Historian Patient  HPI Laurie Frederick is a 39 y.o. female who is here for evaluation of epigastric pain. Pain started yesterday after she drank a beer and feels like burning in her stomach. She states she was previously diagnosed with a stomach ulcer in the past. She's had no vomiting blood she's had no black or bloody stools. She does feel like since she's been retching that she has some abdominal discomfort from retching. She was given Haldol this morning after presenting for abdominal pain and she states that helped some. As she was waiting for her ride to come back, the pain started burning again and so she checked back in for further evaluation.  She does state this is similar to prior episodes of abdominal pain. She has been worked up extensively by her primary care physician, as well as the ER and a GI physician.  She tells me that she is being told now that she since he has psychiatrist because it is psychological.  Pain is moderate. Alcohol did really make it worse.    Past Medical History  Diagnosis Date  . Sleep apnea   . GERD (gastroesophageal reflux disease)   . Hyperlipidemia   . Depression   . Anxiety   . PTSD (post-traumatic stress disorder)   . Obesity   . Anemia   . Vitamin D deficiency   . Nontoxic uninodular goiter   . Multiple gastric ulcers   . H pylori ulcer     Patient Active Problem List   Diagnosis Date Noted  . Abdominal pain, epigastric   . Gastritis   . Hypokalemia 08/16/2015  . Chronic prescription benzodiazepine use 08/16/2015  . Intractable nausea and vomiting 07/22/2015  . Epigastric pain   . Helicobacter positive gastritis 07/16/2015  . Gastritis and gastroduodenitis 07/16/2015  .  Hyperlipidemia 07/13/2015  . Vitamin D deficiency 07/13/2015  . Thyroid nodule 07/13/2015  . Polyp of gallbladder 07/13/2015  . Liver nodule 07/13/2015  . Nausea & vomiting 07/13/2015  . Gastroenteritis 07/06/2015  . 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 Alcohol abuse 04/01/2015    Past Surgical History  Procedure Laterality Date  . Cesarean section    . Esophagogastroduodenoscopy (egd) with propofol Left 07/07/2015    Procedure: ESOPHAGOGASTRODUODENOSCOPY (EGD) WITH PROPOFOL;  Surgeon: Scot Jun, MD;  Location: [facility] ENDOSCOPY;  Service: Endoscopy;  Laterality: Left;  . Cesarean section    . Esophagogastroduodenoscopy (egd) with propofol N/A 09/05/2015    Procedure: ESOPHAGOGASTRODUODENOSCOPY (EGD) WITH PROPOFOL;  Surgeon: Midge Minium, MD;  Location: [facility] ENDOSCOPY;  Service: Endoscopy;  Laterality: N/A;  . Dilation and curettage of uterus      x 2 TAB    Current Outpatient Rx  Name  Route  Sig  Dispense  Refill  . alprazolam (XANAX) 2 MG tablet   Oral   Take 2 mg by mouth 2 (two) times daily as needed for anxiety.          . citalopram (CELEXA) 40 MG tablet   Oral   Take 40 mg by mouth at bedtime.         . ondansetron (ZOFRAN ODT) 4 MG disintegrating tablet   Oral   Take 1 tablet (4 mg total) by mouth every 6 (six) hours as needed for nausea or  vomiting.   20 tablet   0   . pantoprazole (PROTONIX) 40 MG tablet   Oral   Take 1 tablet (40 mg total) by mouth 2 (two) times daily before a meal.   60 tablet   0   . QUEtiapine (SEROQUEL) 50 MG tablet   Oral   Take 100 mg by mouth at bedtime.         Marland Kitchen. zolpidem (AMBIEN) 10 MG tablet   Oral   Take 10 mg by mouth at bedtime.         Marland Kitchen. HYDROcodone-acetaminophen (NORCO) 7.5-325 MG tablet   Oral   Take 1 tablet by mouth every 6 (six) hours as needed for moderate pain. Patient not taking: Reported on 09/24/2015   15 tablet   0   . ondansetron (ZOFRAN) 4 MG tablet   Oral   Take 1 tablet (4 mg  total) by mouth every 8 (eight) hours as needed for nausea or vomiting.   10 tablet   0     Allergies Review of patient's allergies indicates no known allergies.  Family History  Problem Relation Age of Onset  . Lung cancer Father   . Cancer Father     lung cancer  . Alcohol abuse Father   . Lung cancer Paternal Aunt   . Cancer Paternal Aunt     lung  . Bone cancer Paternal Uncle   . Cancer Paternal Uncle     pancreatic  . Depression Mother   . Anxiety disorder Mother   . Arthritis Mother   . Hyperlipidemia Mother   . Hypertension Maternal Grandmother   . Diabetes Paternal Grandmother   . Heart disease Paternal Grandmother   . Hypertension Paternal Grandmother   . Asthma Son   . Vision loss Neg Hx   . COPD Neg Hx     Social History Social History  Substance Use Topics  . Smoking status: Current Some Day Smoker -- 0.25 packs/day for 17 years    Types: Cigarettes  . Smokeless tobacco: Never Used  . Alcohol Use: No     Comment: occasional    Review of Systems  Constitutional: Negative for fever. Eyes: Negative for visual changes. ENT: Negative for sore throat. Cardiovascular: Negative for chest pain. Respiratory: Negative for shortness of breath. Gastrointestinal: Positive for occasional diarrhe. Genitourinary: Negative for dysuria. Musculoskeletal: Negative for back pain. Skin: Negative for rash. Neurological: Negative for headache. 10 point Review of Systems otherwise negative ____________________________________________   PHYSICAL EXAM:  VITAL SIGNS: ED Triage Vitals  Enc Vitals Group     BP 09/24/15 0830 160/112 mmHg     Pulse Rate 09/24/15 0830 116     Resp 09/24/15 0830 16     Temp 09/24/15 0830 98.6 F (37 C)     Temp Source 09/24/15 0830 Oral     SpO2 09/24/15 0830 96 %     Weight 09/24/15 0830 171 lb (77.565 kg)     Height 09/24/15 0830 5\' 2"  (1.575 m)     Head Cir --      Peak Flow --      Pain Score 09/24/15 0831 10     Pain Loc --       Pain Edu? --      Excl. in GC? --      Constitutional: Alert and oriented. Well appearing and in no distress. Eyes: Conjunctivae are normal. PERRL. Normal extraocular movements. ENT   Head: Normocephalic and atraumatic.   Nose: No  congestion/rhinnorhea.   Mouth/Throat: Mucous membranes are moist.   Neck: No stridor. Cardiovascular/Chest: Normal rate, regular rhythm.  No murmurs, rubs, or gallops. Respiratory: Normal respiratory effort without tachypnea nor retractions. Breath sounds are clear and equal bilaterally. No wheezes/rales/rhonchi. Gastrointestinal: Soft. No distention, no guarding, no rebound. Mild tenderness in epigastrium. No focal right upper quadrant tenderness. No lower abdominal tenderness.  Genitourinary/rectal:Deferred Musculoskeletal: Nontender with normal range of motion in all extremities. No joint effusions.  No lower extremity tenderness.  No edema. Neurologic:  Normal speech and language. No gross or focal neurologic deficits are appreciated. Skin:  Skin is warm, dry and intact. No rash noted. Psychiatric: Mood and affect are normal. Speech and behavior are normal. Patient exhibits appropriate insight and judgment.  ____________________________________________   EKG I, Governor Rooks, MD, the attending physician have personally viewed and interpreted all ECGs.   EKG performed ____________________________________________  LABS (pertinent positives/negatives)  Reviewed labs from earlier today  ____________________________________________  RADIOLOGY All Xrays were viewed by me. Imaging interpreted by Radiologist.  None __________________________________________  PROCEDURES  Procedure(s) performed: None  Critical Care performed: None  ____________________________________________   ED COURSE / ASSESSMENT AND PLAN  CONSULTATIONS: None  Pertinent labs & imaging results that were available during my care of the patient were  reviewed by me and considered in my medical decision making (see chart for details).   I had a long discussion with Laurie Frederick about her symptoms over the past several months as well as her evaluation which sounds like extremely thorough including MRI, CT, endoscopy, through visits to her primary care physician, ER, and gastrologist. Last night/this morning's episode is clinically consistent with gastritis brought on by alcohol yesterday.  I do not suspect any additional new emergency cause or emergency complication of gastritis pain.  Her white blood cell count was 13 earlier today, however on her exam I don't find any findings to make me concerned about abscess, diverticulitis, cholecystitis, appendicitis, PID, to make me want to pursue any additional imaging today.  There is no lower abdominal pain I do not suspect a Gu or GYN source of her epigastric discomfort.  I did discuss with her the possibility of trying an anti-inflammatory/ elimination diet.     Patient / Family / Caregiver informed of clinical course, medical decision-making process, and agree with plan.   I discussed return precautions, follow-up instructions, and discharged instructions with patient and/or family.  ___________________________________________   FINAL CLINICAL IMPRESSION(S) / ED DIAGNOSES   Final diagnoses:  Gastritis  Chronic abdominal pain       Governor Rooks, MD 09/24/15 [phone]

## 2015-09-24 NOTE — ED Notes (Signed)
Pt. States on-going upper gastric problems.  Pt. States she has been in ED multiple times for same symptoms.  Pt. States pain started after eating in the evening yesterday.  Pt states that she vomited about 7 times after eating.  Pt. States "I had a beer yesterday morning, I think that got me sick".

## 2015-09-24 NOTE — ED Notes (Signed)
Pt was discharged at 7am and has been sleeping in the WR, pt states the pain medication we gave her is wearing off and she wants to check back in for abd pain.

## 2015-09-24 NOTE — ED Provider Notes (Signed)
[facility] Emergency Department Provider Note  ____________________________________________  Time seen: 6:00 AM  I have reviewed the triage vital signs and the nursing notes.   HISTORY  Chief Complaint Abdominal Pain     HPI Laurie Frederick is a 39 y.o. female presents with epigastric abdominal pain that is currently 9 out of 10 and nonradiating. Patient also admits to nausea and nonbloody emesis.    Past Medical History  Diagnosis Date  . Sleep apnea   . GERD (gastroesophageal reflux disease)   . Hyperlipidemia   . Depression   . Anxiety   . PTSD (post-traumatic stress disorder)   . Obesity   . Anemia   . Vitamin D deficiency   . Nontoxic uninodular goiter   . Multiple gastric ulcers   . H pylori ulcer     Patient Active Problem List   Diagnosis Date Noted  . Abdominal pain, epigastric   . Gastritis   . Hypokalemia 08/16/2015  . Chronic prescription benzodiazepine use 08/16/2015  . Intractable nausea and vomiting 07/22/2015  . Epigastric pain   . Helicobacter positive gastritis 07/16/2015  . Gastritis and gastroduodenitis 07/16/2015  . Hyperlipidemia 07/13/2015  . Vitamin D deficiency 07/13/2015  . Thyroid nodule 07/13/2015  . Polyp of gallbladder 07/13/2015  . Liver nodule 07/13/2015  . Nausea & vomiting 07/13/2015  . Gastroenteritis 07/06/2015  . 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 Alcohol abuse 04/01/2015    Past Surgical History  Procedure Laterality Date  . Cesarean section    . Esophagogastroduodenoscopy (egd) with propofol Left 07/07/2015    Procedure: ESOPHAGOGASTRODUODENOSCOPY (EGD) WITH PROPOFOL;  Surgeon: Scot Jun, MD;  Location: [facility] ENDOSCOPY;  Service: Endoscopy;  Laterality: Left;  . Cesarean section    . Esophagogastroduodenoscopy (egd) with propofol N/A 09/05/2015    Procedure: ESOPHAGOGASTRODUODENOSCOPY (EGD) WITH PROPOFOL;  Surgeon: Midge Minium, MD;  Location: [facility] ENDOSCOPY;  Service:  Endoscopy;  Laterality: N/A;  . Dilation and curettage of uterus      x 2 TAB    Current Outpatient Rx  Name  Route  Sig  Dispense  Refill  . alprazolam (XANAX) 2 MG tablet   Oral   Take 2 mg by mouth 2 (two) times daily as needed for anxiety.          . citalopram (CELEXA) 40 MG tablet   Oral   Take 40 mg by mouth at bedtime.         Marland Kitchen HYDROcodone-acetaminophen (NORCO) 7.5-325 MG tablet   Oral   Take 1 tablet by mouth every 6 (six) hours as needed for moderate pain.   15 tablet   0   . ondansetron (ZOFRAN ODT) 4 MG disintegrating tablet   Oral   Take 1 tablet (4 mg total) by mouth every 6 (six) hours as needed for nausea or vomiting.   20 tablet   0   . pantoprazole (PROTONIX) 40 MG tablet   Oral   Take 1 tablet (40 mg total) by mouth 2 (two) times daily before a meal.   60 tablet   0   . QUEtiapine (SEROQUEL) 50 MG tablet   Oral   Take 100 mg by mouth at bedtime.         Marland Kitchen zolpidem (AMBIEN) 10 MG tablet   Oral   Take 10 mg by mouth at bedtime.           Allergies No known drug allergies  Family History  Problem Relation Age of  Onset  . Lung cancer Father   . Cancer Father     lung cancer  . Alcohol abuse Father   . Lung cancer Paternal Aunt   . Cancer Paternal Aunt     lung  . Bone cancer Paternal Uncle   . Cancer Paternal Uncle     pancreatic  . Depression Mother   . Anxiety disorder Mother   . Arthritis Mother   . Hyperlipidemia Mother   . Hypertension Maternal Grandmother   . Diabetes Paternal Grandmother   . Heart disease Paternal Grandmother   . Hypertension Paternal Grandmother   . Asthma Son   . Vision loss Neg Hx   . COPD Neg Hx     Social History Social History  Substance Use Topics  . Smoking status: Current Some Day Smoker -- 0.25 packs/day for 17 years    Types: Cigarettes  . Smokeless tobacco: Never Used  . Alcohol Use: No     Comment: occasional    Review of Systems  Constitutional: Negative for fever. Eyes:  Negative for visual changes. ENT: Negative for sore throat. Cardiovascular: Negative for chest pain. Respiratory: Negative for shortness of breath. Gastrointestinal: Positive for abdominal pain and vomiting. Genitourinary: Negative for dysuria. Musculoskeletal: Negative for back pain. Skin: Negative for rash. Neurological: Negative for headaches, focal weakness or numbness.   10-point ROS otherwise negative.  ____________________________________________   PHYSICAL EXAM:  VITAL SIGNS: ED Triage Vitals  Enc Vitals Group     BP 09/24/15 0541 172/125 mmHg     Pulse Rate 09/24/15 0541 109     Resp 09/24/15 0541 22     Temp --      Temp src --      SpO2 09/24/15 0541 100 %     Weight --      Height --      Head Cir --      Peak Flow --      Pain Score 09/24/15 0550 10     Pain Loc --      Pain Edu? --      Excl. in GC? --      Constitutional: Alert and oriented. Well appearing and in no distress. Eyes: Conjunctivae are normal. PERRL. Normal extraocular movements. ENT   Head: Normocephalic and atraumatic.   Nose: No congestion/rhinnorhea.   Mouth/Throat: Mucous membranes are moist.   Neck: No stridor. Hematological/Lymphatic/Immunilogical: No cervical lymphadenopathy. Cardiovascular: Normal rate, regular rhythm. Normal and symmetric distal pulses are present in all extremities. No murmurs, rubs, or gallops. Respiratory: Normal respiratory effort without tachypnea nor retractions. Breath sounds are clear and equal bilaterally. No wheezes/rales/rhonchi. Gastrointestinal: Epigastric discomfort with palpation. No distention. There is no CVA tenderness. Genitourinary: deferred Musculoskeletal: Nontender with normal range of motion in all extremities. No joint effusions.  No lower extremity tenderness nor edema. Neurologic:  Normal speech and language. No gross focal neurologic deficits are appreciated. Speech is normal.  Skin:  Skin is warm, dry and intact. No rash  noted. Psychiatric: Mood and affect are normal. Speech and behavior are normal. Patient exhibits appropriate insight and judgment.  ____________________________________________    LABS (pertinent positives/negatives)  Labs Reviewed  CBC - Abnormal; Notable for the following:    WBC 13.1 (*)    RDW 14.8 (*)    All other components within normal limits  COMPREHENSIVE METABOLIC PANEL - Abnormal; Notable for the following:    Glucose, Bld 115 (*)    All other components within normal limits  LIPASE,  BLOOD - Abnormal; Notable for the following:    Lipase 56 (*)    All other components within normal limits      INITIAL IMPRESSION / ASSESSMENT AND PLAN / ED COURSE  Pertinent labs & imaging results that were available during my care of the patient were reviewed by me and considered in my medical decision making (see chart for details).  Patient received Haldol 5 mg IV with marked improvement in abdominal pain Patient has been seen in the emergency department 19 times in the last 6 months for the same complaint multiple CT scans MRI ultrasounds have been performed the patient is seen gastroenterology and had an endoscopy performed  ____________________________________________   FINAL CLINICAL IMPRESSION(S) / ED DIAGNOSES  Final diagnoses:  Abdominal pain, epigastric  Chronic Abdominal Pain    Darci Current, MD 09/24/15 [phone]

## 2015-11-23 ENCOUNTER — Encounter: Payer: Self-pay | Admitting: *Deleted

## 2015-11-23 ENCOUNTER — Emergency Department
Admission: EM | Admit: 2015-11-23 | Discharge: 2015-11-23 | Disposition: A | Discharge status: Home / Self Care | Payer: Medicaid Other | Attending: Emergency Medicine | Admitting: Emergency Medicine

## 2015-11-23 DIAGNOSIS — R112 Nausea with vomiting, unspecified: Secondary | ICD-10-CM | POA: Insufficient documentation

## 2015-11-23 DIAGNOSIS — R109 Unspecified abdominal pain: Secondary | ICD-10-CM

## 2015-11-23 DIAGNOSIS — K219 Gastro-esophageal reflux disease without esophagitis: Secondary | ICD-10-CM | POA: Diagnosis not present

## 2015-11-23 DIAGNOSIS — Z79899 Other long term (current) drug therapy: Secondary | ICD-10-CM | POA: Insufficient documentation

## 2015-11-23 DIAGNOSIS — F1310 Sedative, hypnotic or anxiolytic abuse, uncomplicated: Secondary | ICD-10-CM | POA: Diagnosis not present

## 2015-11-23 DIAGNOSIS — R197 Diarrhea, unspecified: Secondary | ICD-10-CM | POA: Insufficient documentation

## 2015-11-23 DIAGNOSIS — F17210 Nicotine dependence, cigarettes, uncomplicated: Secondary | ICD-10-CM | POA: Diagnosis not present

## 2015-11-23 DIAGNOSIS — R1110 Vomiting, unspecified: Secondary | ICD-10-CM

## 2015-11-23 LAB — BASIC METABOLIC PANEL
Anion gap: 8 (ref 5–15)
BUN: 9 mg/dL (ref 6–20)
CO2: 27 mmol/L (ref 22–32)
Calcium: 9.9 mg/dL (ref 8.9–10.3)
Chloride: 106 mmol/L (ref 101–111)
Creatinine, Ser: 0.69 mg/dL (ref 0.44–1.00)
GFR calc Af Amer: >60 mL/min (ref >60)
GFR calc non Af Amer: >60 mL/min (ref >60)
Glucose, Bld: 158 mg/dL — ABNORMAL HIGH (ref 65–99)
Potassium: 3.3 mmol/L — ABNORMAL LOW (ref 3.5–5.1)
Sodium: 141 mmol/L (ref 135–145)

## 2015-11-23 LAB — CBC WITH DIFFERENTIAL/PLATELET
Basophils Absolute: 0 10*3/uL (ref 0–0.1)
Basophils Relative: 0 %
Eosinophils Absolute: 0 10*3/uL (ref 0–0.7)
Eosinophils Relative: 0 %
HCT: 36.9 % (ref 35.0–47.0)
Hemoglobin: 12.1 g/dL (ref 12.0–16.0)
Lymphocytes Relative: 16 %
Lymphs Abs: 1.1 10*3/uL (ref 1.0–3.6)
MCH: 27.7 pg (ref 26.0–34.0)
MCHC: 32.8 g/dL (ref 32.0–36.0)
MCV: 84.4 fL (ref 80.0–100.0)
Monocytes Absolute: 0.2 10*3/uL (ref 0.2–0.9)
Monocytes Relative: 3 %
Neutro Abs: 5.6 10*3/uL (ref 1.4–6.5)
Neutrophils Relative %: 81 %
Platelets: 251 10*3/uL (ref 150–440)
RBC: 4.37 MIL/uL (ref 3.80–5.20)
RDW: 16.8 % — ABNORMAL HIGH (ref 11.5–14.5)
WBC: 7 10*3/uL (ref 3.6–11.0)

## 2015-11-23 LAB — URINALYSIS COMPLETE WITH MICROSCOPIC (ARMC ONLY)
Bacteria, UA: NONE SEEN
Bilirubin Urine: NEGATIVE
GLUCOSE, UA: NEGATIVE mg/dL
Hgb urine dipstick: NEGATIVE
NITRITE: NEGATIVE
Protein, ur: 100 mg/dL — AB
SPECIFIC GRAVITY, URINE: 1.032 — AB (ref 1.005–1.030)
pH: 8 (ref 5.0–8.0)

## 2015-11-23 LAB — HEPATIC FUNCTION PANEL
ALK PHOS: 77 U/L (ref 38–126)
ALT: 24 U/L (ref 14–54)
AST: 23 U/L (ref 15–41)
Albumin: 4.4 g/dL (ref 3.5–5.0)
Bilirubin, Direct: <0.1 mg/dL — ABNORMAL LOW (ref 0.1–0.5)
TOTAL PROTEIN: 8.3 g/dL — AB (ref 6.5–8.1)
Total Bilirubin: 0.4 mg/dL (ref 0.3–1.2)

## 2015-11-23 LAB — URINE DRUG SCREEN, QUALITATIVE (ARMC ONLY)
Amphetamines, Ur Screen: NOT DETECTED
Barbiturates, Ur Screen: NOT DETECTED
Benzodiazepine, Ur Scrn: POSITIVE — AB
Cannabinoid 50 Ng, Ur ~~LOC~~: NOT DETECTED
Cocaine Metabolite,Ur ~~LOC~~: NOT DETECTED
MDMA (Ecstasy)Ur Screen: NOT DETECTED
Methadone Scn, Ur: NOT DETECTED
Opiate, Ur Screen: NOT DETECTED
Phencyclidine (PCP) Ur S: NOT DETECTED
Tricyclic, Ur Screen: POSITIVE — AB

## 2015-11-23 LAB — ETHANOL: Alcohol, Ethyl (B): <5 mg/dL (ref <5)

## 2015-11-23 LAB — LIPASE, BLOOD: Lipase: 20 U/L (ref 11–51)

## 2015-11-23 MED ORDER — PANTOPRAZOLE SODIUM 40 MG IV SOLR
40.0000 mg | Freq: Once | INTRAVENOUS | Status: AC
  Administered 2015-11-23: 40 mg via INTRAVENOUS
  Filled 2015-11-23: qty 40

## 2015-11-23 MED ORDER — CLONIDINE HCL 0.1 MG PO TABS
0.1000 mg | ORAL_TABLET | Freq: Once | ORAL | Status: AC
  Administered 2015-11-23: 0.1 mg via ORAL
  Filled 2015-11-23: qty 1

## 2015-11-23 MED ORDER — DIPHENHYDRAMINE HCL 50 MG/ML IJ SOLN
25.0000 mg | Freq: Once | INTRAMUSCULAR | Status: AC
  Administered 2015-11-23: 25 mg via INTRAVENOUS

## 2015-11-23 MED ORDER — CLONIDINE HCL 0.1 MG PO TABS
ORAL_TABLET | ORAL | Status: AC
  Filled 2015-11-23: qty 1

## 2015-11-23 MED ORDER — DIPHENHYDRAMINE HCL 50 MG/ML IJ SOLN
INTRAMUSCULAR | Status: AC
  Administered 2015-11-23: 25 mg via INTRAVENOUS
  Filled 2015-11-23: qty 1

## 2015-11-23 MED ORDER — ONDANSETRON 4 MG PO TBDP: 4.0000 mg | ORAL_TABLET | Freq: Three times a day (TID) | ORAL | Status: DC | PRN

## 2015-11-23 MED ORDER — DEXTROSE-NACL 5-0.45 % IV SOLN
INTRAVENOUS | Status: DC
  Administered 2015-11-23: 07:00:00 via INTRAVENOUS

## 2015-11-23 MED ORDER — PROCHLORPERAZINE EDISYLATE 5 MG/ML IJ SOLN
INTRAMUSCULAR | Status: AC
  Administered 2015-11-23: 5 mg via INTRAVENOUS
  Filled 2015-11-23: qty 2

## 2015-11-23 MED ORDER — HALOPERIDOL LACTATE 5 MG/ML IJ SOLN
5.0000 mg | Freq: Once | INTRAMUSCULAR | Status: AC
  Administered 2015-11-23: 5 mg via INTRAVENOUS
  Filled 2015-11-23: qty 1

## 2015-11-23 MED ORDER — SODIUM CHLORIDE 0.9 % IV BOLUS (SEPSIS)
1000.0000 mL | Freq: Once | INTRAVENOUS | Status: AC
  Administered 2015-11-23: 1000 mL via INTRAVENOUS

## 2015-11-23 MED ORDER — PROCHLORPERAZINE EDISYLATE 5 MG/ML IJ SOLN
5.0000 mg | Freq: Once | INTRAMUSCULAR | Status: AC
  Administered 2015-11-23: 5 mg via INTRAVENOUS

## 2015-11-23 MED ORDER — DEXTROSE 5 % IV SOLN
1.0000 g | INTRAVENOUS | Status: DC
  Administered 2015-11-23: 1 g via INTRAVENOUS
  Filled 2015-11-23: qty 10

## 2015-11-23 NOTE — ED Notes (Addendum)
Pt to triage via wheelchair.  Pt reports n/v/d since 1900 tonight.  Pt also has abd pain.

## 2015-11-23 NOTE — ED Notes (Signed)
Pt ambulated to toilet in room with no difficulty or distress noted.

## 2015-11-23 NOTE — Discharge Instructions (Signed)
1. Return to your Suboxone clinic today. 2. You may take nausea medicine as needed (Zofran #20). 3. Return to the ER for worsened symptoms, persistent vomiting, difficulty breathing or other concerns.  Abdominal Pain, Adult Many things can cause abdominal pain. Usually, abdominal pain is not caused by a disease and will improve without treatment. It can often be observed and treated at home. Your health care provider will do a physical exam and possibly order blood tests and X-rays to help determine the seriousness of your pain. However, in many cases, more time must pass before a clear cause of the pain can be found. Before that point, your health care provider may not know if you need more testing or further treatment. HOME CARE INSTRUCTIONS Monitor your abdominal pain for any changes. The following actions may help to alleviate any discomfort you are experiencing:  Only take over-the-counter or prescription medicines as directed by your health care provider.  Do not take laxatives unless directed to do so by your health care provider.  Try a clear liquid diet (broth, tea, or water) as directed by your health care provider. Slowly move to a bland diet as tolerated. SEEK MEDICAL CARE IF:  You have unexplained abdominal pain.  You have abdominal pain associated with nausea or diarrhea.  You have pain when you urinate or have a bowel movement.  You experience abdominal pain that wakes you in the night.  You have abdominal pain that is worsened or improved by eating food.  You have abdominal pain that is worsened with eating fatty foods.  You have a fever. SEEK IMMEDIATE MEDICAL CARE IF:  Your pain does not go away within 2 hours.  You keep throwing up (vomiting).  Your pain is felt only in portions of the abdomen, such as the right side or the left lower portion of the abdomen.  You pass bloody or black tarry stools. MAKE SURE YOU:  Understand these instructions.  Will watch  your condition.  Will get help right away if you are not doing well or get worse.   This information is not intended to replace advice given to you by your health care provider. Make sure you discuss any questions you have with your health care provider.   Document Released: 07/26/2005 Document Revised: 07/07/2015 Document Reviewed: 06/25/2013 Elsevier Interactive Patient Education 2016 [employer]  Nausea and Vomiting Nausea is a sick feeling that often comes before throwing up (vomiting). Vomiting is a reflex where stomach contents come out of your mouth. Vomiting can cause severe loss of body fluids (dehydration). Children and elderly adults can become dehydrated quickly, especially if they also have diarrhea. Nausea and vomiting are symptoms of a condition or disease. It is important to find the cause of your symptoms. CAUSES   Direct irritation of the stomach lining. This irritation can result from increased acid production (gastroesophageal reflux disease), infection, food poisoning, taking certain medicines (such as nonsteroidal anti-inflammatory drugs), alcohol use, or tobacco use.  Signals from the brain.These signals could be caused by a headache, heat exposure, an inner ear disturbance, increased pressure in the brain from injury, infection, a tumor, or a concussion, pain, emotional stimulus, or metabolic problems.  An obstruction in the gastrointestinal tract (bowel obstruction).  Illnesses such as diabetes, hepatitis, gallbladder problems, appendicitis, kidney problems, cancer, sepsis, atypical symptoms of a heart attack, or eating disorders.  Medical treatments such as chemotherapy and radiation.  Receiving medicine that makes you sleep (general anesthetic) during surgery. DIAGNOSIS Your  caregiver may ask for tests to be done if the problems do not improve after a few days. Tests may also be done if symptoms are severe or if the reason for the nausea and vomiting is not  clear. Tests may include:  Urine tests.  Blood tests.  Stool tests.  Cultures (to look for evidence of infection).  X-rays or other imaging studies. Test results can help your caregiver make decisions about treatment or the need for additional tests. TREATMENT You need to stay well hydrated. Drink frequently but in small amounts.You may wish to drink water, sports drinks, clear broth, or eat frozen ice pops or gelatin dessert to help stay hydrated.When you eat, eating slowly may help prevent nausea.There are also some antinausea medicines that may help prevent nausea. HOME CARE INSTRUCTIONS   Take all medicine as directed by your caregiver.  If you do not have an appetite, do not force yourself to eat. However, you must continue to drink fluids.  If you have an appetite, eat a normal diet unless your caregiver tells you differently.  Eat a variety of complex carbohydrates (rice, wheat, potatoes, bread), lean meats, yogurt, fruits, and vegetables.  Avoid high-fat foods because they are more difficult to digest.  Drink enough water and fluids to keep your urine clear or pale yellow.  If you are dehydrated, ask your caregiver for specific rehydration instructions. Signs of dehydration may include:  Severe thirst.  Dry lips and mouth.  Dizziness.  Dark urine.  Decreasing urine frequency and amount.  Confusion.  Rapid breathing or pulse. SEEK IMMEDIATE MEDICAL CARE IF:   You have blood or brown flecks (like coffee grounds) in your vomit.  You have black or bloody stools.  You have a severe headache or stiff neck.  You are confused.  You have severe abdominal pain.  You have chest pain or trouble breathing.  You do not urinate at least once every 8 hours.  You develop cold or clammy skin.  You continue to vomit for longer than 24 to 48 hours.  You have a fever. MAKE SURE YOU:   Understand these instructions.  Will watch your condition.  Will get help  right away if you are not doing well or get worse.   This information is not intended to replace advice given to you by your health care provider. Make sure you discuss any questions you have with your health care provider.   Document Released: 10/16/2005 Document Revised: 01/08/2012 Document Reviewed: 03/15/2011 Elsevier Interactive Patient Education 2016 [employer] Inc[employer]  Diarrhea Diarrhea is watery poop (stool). It can make you feel weak, tired, thirsty, or give you a dry mouth (signs of dehydration). Watery poop is a sign of another problem, most often an infection. It often lasts 2-3 days. It can last longer if it is a sign of something serious. Take care of yourself as told by your doctor. HOME CARE   Drink 1 cup (8 ounces) of fluid each time you have watery poop.  Do not drink the following fluids:  Those that contain simple sugars (fructose, glucose, galactose, lactose, sucrose, maltose).  Sports drinks.  Fruit juices.  Whole milk products.  Sodas.  Drinks with caffeine (coffee, tea, soda) or alcohol.  Oral rehydration solution may be used if the doctor says it is okay. You may make your own solution. Follow this recipe:   - teaspoon table salt.   teaspoon baking soda.   teaspoon salt substitute containing potassium chloride.  1 tablespoons sugar.  1 liter (34 ounces) of water.  Avoid the following foods:  High fiber foods, such as raw fruits and vegetables.  Nuts, seeds, and whole grain breads and cereals.   Those that are sweetened with sugar alcohols (xylitol, sorbitol, mannitol).  Try eating the following foods:  Starchy foods, such as rice, toast, pasta, low-sugar cereal, oatmeal, baked potatoes, crackers, and bagels.  Bananas.  Applesauce.  Eat probiotic-rich foods, such as yogurt and milk products that are fermented.  Wash your hands well after each time you have watery poop.  Only take medicine as told by your doctor.  Take a warm bath to  help lessen burning or pain from having watery poop. GET HELP RIGHT AWAY IF:   You cannot drink fluids without throwing up (vomiting).  You keep throwing up.  You have blood in your poop, or your poop looks black and tarry.  You do not pee (urinate) in 6-8 hours, or there is only a small amount of very dark pee.  You have belly (abdominal) pain that gets worse or stays in the same spot (localizes).  You are weak, dizzy, confused, or light-headed.  You have a very bad headache.  Your watery poop gets worse or does not get better.  You have a fever or lasting symptoms for more than 2-3 days.  You have a fever and your symptoms suddenly get worse. MAKE SURE YOU:   Understand these instructions.  Will watch your condition.  Will get help right away if you are not doing well or get worse.   This information is not intended to replace advice given to you by your health care provider. Make sure you discuss any questions you have with your health care provider.   Document Released: 04/03/2008 Document Revised: 11/06/2014 Document Reviewed: 06/23/2012 Elsevier Interactive Patient Education [employer]

## 2015-11-23 NOTE — ED Notes (Signed)
Patient reports being prescribed opiates at one time for gastritis and abdominal pain. 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 States she then developed withdrawal from Suboxone (last use one week ago per patient). Patient reports N/V since 1900 last night. Denies any diarrhea or fever.

## 2015-11-23 NOTE — ED Provider Notes (Signed)
[facility] Emergency Department Provider Note  ____________________________________________  Time seen: Approximately 4:40 AM  I have reviewed the triage vital signs and the nursing notes.   HISTORY  Chief Complaint Nausea and Emesis    HPI Laurie Frederick is a 40 y.o. female who presents to the ED from home with a chief complaint of N/V/D and abdominal pain.Patient has a history of GERD, H. pylori and narcotic seeking behavior with multiple prior ED visits and hospitalizations for same. States she began to experience nausea and vomiting yesterday evening approximately 7 PM which was then accompanied by diarrhea. Also complains of generalized abdominal cramping. States she was going to the Suboxone clinic but stopped going 2 weeks ago because she "thought I was better". She firmly believes her symptoms this evening are related to withdrawal. Denies fever, chills, chest pain, shortness of breath, dysuria. Denies recent travel or trauma. Nothing makes her symptoms better or worse.   Past Medical History  Diagnosis Date  . Sleep apnea   . GERD (gastroesophageal reflux disease)   . Hyperlipidemia   . Depression   . Anxiety   . PTSD (post-traumatic stress disorder)   . Obesity   . Anemia   . Vitamin D deficiency   . Nontoxic uninodular goiter   . Multiple gastric ulcers   . H pylori ulcer     Patient Active Problem List   Diagnosis Date Noted  . Abdominal pain, epigastric   . Gastritis   . Hypokalemia 08/16/2015  . Chronic prescription benzodiazepine use 08/16/2015  . Intractable nausea and vomiting 07/22/2015  . Epigastric pain   . Helicobacter positive gastritis 07/16/2015  . Gastritis and gastroduodenitis 07/16/2015  . Hyperlipidemia 07/13/2015  . Vitamin D deficiency 07/13/2015  . Thyroid nodule 07/13/2015  . Polyp of gallbladder 07/13/2015  . Liver nodule 07/13/2015  . Nausea & vomiting 07/13/2015  . Gastroenteritis 07/06/2015  .  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 Alcohol abuse 04/01/2015    Past Surgical History  Procedure Laterality Date  . Cesarean section    . Esophagogastroduodenoscopy (egd) with propofol Left 07/07/2015    Procedure: ESOPHAGOGASTRODUODENOSCOPY (EGD) WITH PROPOFOL;  Surgeon: Scot Jun, MD;  Location: [facility] ENDOSCOPY;  Service: Endoscopy;  Laterality: Left;  . Cesarean section    . Esophagogastroduodenoscopy (egd) with propofol N/A 09/05/2015    Procedure: ESOPHAGOGASTRODUODENOSCOPY (EGD) WITH PROPOFOL;  Surgeon: Midge Minium, MD;  Location: [facility] ENDOSCOPY;  Service: Endoscopy;  Laterality: N/A;  . Dilation and curettage of uterus      x 2 TAB    Current Outpatient Rx  Name  Route  Sig  Dispense  Refill  . alprazolam (XANAX) 2 MG tablet   Oral   Take 2 mg by mouth 2 (two) times daily as needed for anxiety.          . citalopram (CELEXA) 40 MG tablet   Oral   Take 40 mg by mouth at bedtime.         Marland Kitchen HYDROcodone-acetaminophen (NORCO) 7.5-325 MG tablet   Oral   Take 1 tablet by mouth every 6 (six) hours as needed for moderate pain. Patient not taking: Reported on 09/24/2015   15 tablet   0   . ondansetron (ZOFRAN ODT) 4 MG disintegrating tablet   Oral   Take 1 tablet (4 mg total) by mouth every 6 (six) hours as needed for nausea or vomiting.   20 tablet   0   . ondansetron (ZOFRAN) 4 MG  tablet   Oral   Take 1 tablet (4 mg total) by mouth every 8 (eight) hours as needed for nausea or vomiting.   10 tablet   0   . pantoprazole (PROTONIX) 40 MG tablet   Oral   Take 1 tablet (40 mg total) by mouth 2 (two) times daily before a meal.   60 tablet   0   . QUEtiapine (SEROQUEL) 50 MG tablet   Oral   Take 100 mg by mouth at bedtime.         Marland Kitchen zolpidem (AMBIEN) 10 MG tablet   Oral   Take 10 mg by mouth at bedtime.           Allergies Review of patient's allergies indicates no known allergies.  Family History  Problem Relation Age of Onset  . Lung  cancer Father   . Cancer Father     lung cancer  . Alcohol abuse Father   . Lung cancer Paternal Aunt   . Cancer Paternal Aunt     lung  . Bone cancer Paternal Uncle   . Cancer Paternal Uncle     pancreatic  . Depression Mother   . Anxiety disorder Mother   . Arthritis Mother   . Hyperlipidemia Mother   . Hypertension Maternal Grandmother   . Diabetes Paternal Grandmother   . Heart disease Paternal Grandmother   . Hypertension Paternal Grandmother   . Asthma Son   . Vision loss Neg Hx   . COPD Neg Hx     Social History Social History  Substance Use Topics  . Smoking status: Current Some Day Smoker -- 0.25 packs/day for 17 years    Types: Cigarettes  . Smokeless tobacco: Never Used  . Alcohol Use: No     Comment: occasional    Review of Systems Constitutional: No fever/chills Eyes: No visual changes. ENT: No sore throat. Cardiovascular: Denies chest pain. Respiratory: Denies shortness of breath.  Gastrointestinal: Positive for abdominal pain, nausea, vomiting and diarrhea.  No constipation. Genitourinary: Negative for dysuria. Musculoskeletal: Negative for back pain. Skin: Negative for rash. Neurological: Negative for headaches, focal weakness or numbness.  10-point ROS otherwise negative.  ____________________________________________   PHYSICAL EXAM:  VITAL SIGNS: ED Triage Vitals  Enc Vitals Group     BP 11/23/15 0145 157/95 mmHg     Pulse Rate 11/23/15 0145 74     Resp 11/23/15 0145 20     Temp 11/23/15 0145 98.2 F (36.8 C)     Temp Source 11/23/15 0145 Oral     SpO2 11/23/15 0145 95 %     Weight 11/23/15 0145 171 lb (77.565 kg)     Height 11/23/15 0145  (1.575 m)     Head Cir --      Peak Flow --      Pain Score 11/23/15 0152 9     Pain Loc --      Pain Edu? --      Excl. in GC? --     Constitutional: Alert and oriented. Well appearing and in no acute distress. Eyes: Conjunctivae are normal. PERRL. EOMI. Head: Atraumatic. Nose: No  congestion/rhinnorhea. Mouth/Throat: Mucous membranes are moist.  Oropharynx non-erythematous. Neck: No stridor.   Cardiovascular: Normal rate, regular rhythm. Grossly normal heart sounds.  Good peripheral circulation. Respiratory: Normal respiratory effort.  No retractions. Lungs CTAB. Gastrointestinal: Soft and minimally tender to palpation diffusely without rebound or guarding. No distention. No abdominal bruits. No CVA tenderness. Musculoskeletal: No lower  extremity tenderness nor edema.  No joint effusions. Neurologic:  Normal speech and language. No gross focal neurologic deficits are appreciated. No gait instability. Skin:  Skin is warm, dry and intact. No rash noted. Psychiatric: Mood and affect are normal. Speech and behavior are normal.  ____________________________________________   LABS (all labs ordered are listed, but only abnormal results are displayed)  Labs Reviewed  CBC WITH DIFFERENTIAL/PLATELET - Abnormal; Notable for the following:    RDW 16.8 (*)    All other components within normal limits  URINALYSIS COMPLETEWITH MICROSCOPIC ([facility] ONLY) - Abnormal; Notable for the following:    Color, Urine YELLOW (*)    APPearance HAZY (*)    Ketones, ur 2+ (*)    Specific Gravity, Urine 1.032 (*)    Protein, ur 100 (*)    Leukocytes, UA TRACE (*)    Squamous Epithelial / LPF 6-30 (*)    All other components within normal limits  BASIC METABOLIC PANEL - Abnormal; Notable for the following:    Potassium 3.3 (*)    Glucose, Bld 158 (*)    All other components within normal limits  HEPATIC FUNCTION PANEL - Abnormal; Notable for the following:    Total Protein 8.3 (*)    Bilirubin, Direct <0.1 (*)    All other components within normal limits  URINE DRUG SCREEN, QUALITATIVE ([facility] ONLY) - Abnormal; Notable for the following:    Tricyclic, Ur Screen POSITIVE (*)    Benzodiazepine, Ur Scrn POSITIVE (*)    All other components within normal limits  LIPASE, BLOOD  ETHANOL   POC URINE PREG, ED   ____________________________________________  EKG  None ____________________________________________  RADIOLOGY  None ____________________________________________   PROCEDURES  Procedure(s) performed: None  Critical Care performed: No  ____________________________________________   INITIAL IMPRESSION / ASSESSMENT AND PLAN / ED COURSE  Pertinent labs & imaging results that were available during my care of the patient were reviewed by me and considered in my medical decision making (see chart for details).  40 year old female with history of GERD, past history of H. pylori with numerous prior imaging studies including CTs, MRI as well as multiple endoscopies. Firmly feels her symptoms are related to withdrawal. I discussed with her I would expect withdrawal symptoms several days after she stopped attending the Suboxone clinic in that 2 weeks out her symptoms this evening are unlikely related to withdrawal symptoms. I have explained that she will not benefit from narcotic analgesia and in fact, it would hinder her progress as far as abstaining from opiates. Will treat with non-opiate medications and antiemetic as well as IV fluid resuscitation.  ----------------------------------------- 7:19 AM on 11/23/2015 -----------------------------------------  Patient requesting something for nausea. She has not been observed to be vomiting in the ED. She also insists on receiving something for opiate withdrawal. Again, I do not believe she is in acute opiate withdrawal if she actually did stop going to the suboxone clinic 2 weeks ago. However, will administer low-dose clonidine. She is again asking for pain medicines for epigastric discomfort. Will give protonix. Anticipate patient will be able to be discharged home once nausea improves. Care transferred to Dr. Shaune Pollack. ____________________________________________   FINAL CLINICAL IMPRESSION(S) / ED DIAGNOSES  Final  diagnoses:  Combined abdominal pain, vomiting, and diarrhea      Irean Hong, MD 11/23/15 [id number]

## 2015-11-23 NOTE — ED Notes (Signed)
Called lab to add on hepatic function and ethanol levels.

## 2015-12-01 ENCOUNTER — Other Ambulatory Visit: Payer: Self-pay | Admitting: Family Medicine

## 2015-12-01 NOTE — Telephone Encounter (Signed)
Left message to call.

## 2015-12-01 NOTE — Telephone Encounter (Signed)
This is not a medicine I have ever prescribed; I suggest she call the prescribing doctor; I don't think she is seeing me any more anyway; please clarify

## 2015-12-02 ENCOUNTER — Other Ambulatory Visit: Payer: Self-pay | Admitting: Family Medicine

## 2015-12-02 NOTE — Telephone Encounter (Signed)
Patient notified

## 2015-12-02 NOTE — Telephone Encounter (Signed)
I don't see that we ever prescribed this I don't think the patient sees me anymore We would suggest she call the original prescriber or see a provider here to discuss and get refills if appropriate

## 2016-02-02 ENCOUNTER — Emergency Department
Admission: EM | Admit: 2016-02-02 | Discharge: 2016-02-02 | Disposition: A | Discharge status: Home / Self Care | Payer: Medicaid Other | Attending: Emergency Medicine | Admitting: Emergency Medicine

## 2016-02-02 ENCOUNTER — Encounter: Payer: Self-pay | Admitting: Emergency Medicine

## 2016-02-02 ENCOUNTER — Emergency Department: Payer: Medicaid Other

## 2016-02-02 DIAGNOSIS — E785 Hyperlipidemia, unspecified: Secondary | ICD-10-CM | POA: Insufficient documentation

## 2016-02-02 DIAGNOSIS — F17210 Nicotine dependence, cigarettes, uncomplicated: Secondary | ICD-10-CM | POA: Diagnosis not present

## 2016-02-02 DIAGNOSIS — R112 Nausea with vomiting, unspecified: Secondary | ICD-10-CM | POA: Diagnosis not present

## 2016-02-02 DIAGNOSIS — R1030 Lower abdominal pain, unspecified: Secondary | ICD-10-CM | POA: Diagnosis present

## 2016-02-02 DIAGNOSIS — E669 Obesity, unspecified: Secondary | ICD-10-CM | POA: Insufficient documentation

## 2016-02-02 DIAGNOSIS — F4310 Post-traumatic stress disorder, unspecified: Secondary | ICD-10-CM | POA: Insufficient documentation

## 2016-02-02 DIAGNOSIS — F329 Major depressive disorder, single episode, unspecified: Secondary | ICD-10-CM | POA: Diagnosis not present

## 2016-02-02 DIAGNOSIS — R1013 Epigastric pain: Secondary | ICD-10-CM | POA: Diagnosis not present

## 2016-02-02 LAB — URINALYSIS COMPLETE WITH MICROSCOPIC (ARMC ONLY)
Bilirubin Urine: NEGATIVE
Glucose, UA: NEGATIVE mg/dL
LEUKOCYTES UA: NEGATIVE
NITRITE: NEGATIVE
PROTEIN: 100 mg/dL — AB
SPECIFIC GRAVITY, URINE: 1.031 — AB (ref 1.005–1.030)
pH: 5 (ref 5.0–8.0)

## 2016-02-02 LAB — WET PREP, GENITAL
Sperm: NONE SEEN
Trich, Wet Prep: NONE SEEN
Yeast Wet Prep HPF POC: NONE SEEN

## 2016-02-02 LAB — CBC
HEMATOCRIT: 36.3 % (ref 35.0–47.0)
HEMOGLOBIN: 12.2 g/dL (ref 12.0–16.0)
MCH: 27.8 pg (ref 26.0–34.0)
MCHC: 33.7 g/dL (ref 32.0–36.0)
MCV: 82.7 fL (ref 80.0–100.0)
Platelets: 227 10*3/uL (ref 150–440)
RBC: 4.39 MIL/uL (ref 3.80–5.20)
RDW: 17.2 % — ABNORMAL HIGH (ref 11.5–14.5)
WBC: 7.8 10*3/uL (ref 3.6–11.0)

## 2016-02-02 LAB — COMPREHENSIVE METABOLIC PANEL
ALT: 17 U/L (ref 14–54)
ANION GAP: 9 (ref 5–15)
AST: 20 U/L (ref 15–41)
Albumin: 4.7 g/dL (ref 3.5–5.0)
Alkaline Phosphatase: 73 U/L (ref 38–126)
BUN: 11 mg/dL (ref 6–20)
CHLORIDE: 108 mmol/L (ref 101–111)
CO2: 22 mmol/L (ref 22–32)
Calcium: 9.6 mg/dL (ref 8.9–10.3)
Creatinine, Ser: 0.65 mg/dL (ref 0.44–1.00)
Glucose, Bld: 131 mg/dL — ABNORMAL HIGH (ref 65–99)
POTASSIUM: 3.5 mmol/L (ref 3.5–5.1)
Sodium: 139 mmol/L (ref 135–145)
TOTAL PROTEIN: 8.3 g/dL — AB (ref 6.5–8.1)
Total Bilirubin: 0.8 mg/dL (ref 0.3–1.2)

## 2016-02-02 LAB — LIPASE, BLOOD: LIPASE: 17 U/L (ref 11–51)

## 2016-02-02 LAB — POCT PREGNANCY, URINE: PREG TEST UR: NEGATIVE

## 2016-02-02 LAB — CHLAMYDIA/NGC RT PCR (ARMC ONLY)
CHLAMYDIA TR: NOT DETECTED
N GONORRHOEAE: NOT DETECTED

## 2016-02-02 MED ORDER — DEXTROSE 5 % IV SOLN
250.0000 mg | Freq: Once | INTRAVENOUS | Status: AC
  Administered 2016-02-02: 250 mg via INTRAVENOUS
  Filled 2016-02-02: qty 250

## 2016-02-02 MED ORDER — AZITHROMYCIN 500 MG PO TABS
1000.0000 mg | ORAL_TABLET | Freq: Once | ORAL | Status: AC
  Administered 2016-02-02: 1000 mg via ORAL
  Filled 2016-02-02: qty 2

## 2016-02-02 MED ORDER — METRONIDAZOLE 500 MG PO TABS: 500.0000 mg | ORAL_TABLET | Freq: Two times a day (BID) | ORAL | Status: AC

## 2016-02-02 MED ORDER — ONDANSETRON 4 MG PO TBDP: 4.0000 mg | ORAL_TABLET | Freq: Three times a day (TID) | ORAL | Status: DC | PRN

## 2016-02-02 MED ORDER — SODIUM CHLORIDE 0.9 % IV BOLUS (SEPSIS)
1000.0000 mL | Freq: Once | INTRAVENOUS | Status: AC
Start: 2016-02-02 — End: 2016-02-02
  Administered 2016-02-02: 1000 mL via INTRAVENOUS

## 2016-02-02 MED ORDER — SODIUM CHLORIDE 0.9 % IV BOLUS (SEPSIS)
1000.0000 mL | Freq: Once | INTRAVENOUS | Status: AC
  Administered 2016-02-02: 1000 mL via INTRAVENOUS

## 2016-02-02 MED ORDER — MORPHINE SULFATE (PF) 4 MG/ML IV SOLN
4.0000 mg | Freq: Once | INTRAVENOUS | Status: AC
  Administered 2016-02-02: 4 mg via INTRAVENOUS
  Filled 2016-02-02: qty 1

## 2016-02-02 MED ORDER — ONDANSETRON HCL 4 MG/2ML IJ SOLN
4.0000 mg | Freq: Once | INTRAMUSCULAR | Status: AC
  Administered 2016-02-02: 4 mg via INTRAVENOUS
  Filled 2016-02-02: qty 2

## 2016-02-02 MED ORDER — HYDROCODONE-ACETAMINOPHEN 5-325 MG PO TABS: 1.0000 | ORAL_TABLET | ORAL | Status: DC | PRN

## 2016-02-02 MED ORDER — HYDROMORPHONE HCL 1 MG/ML IJ SOLN
1.0000 mg | Freq: Once | INTRAMUSCULAR | Status: AC
  Administered 2016-02-02: 1 mg via INTRAVENOUS
  Filled 2016-02-02: qty 1

## 2016-02-02 NOTE — ED Provider Notes (Signed)
[facility] Emergency Department Provider Note  Time seen: 2:29 PM  I have reviewed the triage vital signs and the nursing notes.   HISTORY  Chief Complaint Abdominal Pain and Dysuria    HPI Laurie Frederick is a 40 y.o. female with a past medical history of gastric reflux, hyperlipidemia, PTSD, presents the emergency department with nausea, vomiting, upper and lower abdominal pain. According to the patient for the past 3 days she has been very nauseated with frequent vomiting, today she is not able to keep anything down so she came to the emergency department for evaluation. She also states over the past 2 days she has been having lower abdominal cramping and vaginal spotting. She states her last period was approximately 3 weeks ago. Describes her lower abdominal cramping as moderate in severity. She also states she is experiencing epigastric pain but only when she vomits. Denies fever. Denies dysuria. Denies diarrhea.     Past Medical History  Diagnosis Date  . Sleep apnea   . GERD (gastroesophageal reflux disease)   . Hyperlipidemia   . Depression   . Anxiety   . PTSD (post-traumatic stress disorder)   . Obesity   . Anemia   . Vitamin D deficiency   . Nontoxic uninodular goiter   . Multiple gastric ulcers   . H pylori ulcer     Patient Active Problem List   Diagnosis Date Noted  . Abdominal pain, epigastric   . Gastritis   . Hypokalemia 08/16/2015  . Chronic prescription benzodiazepine use 08/16/2015  . Intractable nausea and vomiting 07/22/2015  . Epigastric pain   . Helicobacter positive gastritis 07/16/2015  . Gastritis and gastroduodenitis 07/16/2015  . Hyperlipidemia 07/13/2015  . Vitamin D deficiency 07/13/2015  . Thyroid nodule 07/13/2015  . Polyp of gallbladder 07/13/2015  . Liver nodule 07/13/2015  . Nausea & vomiting 07/13/2015  . Gastroenteritis 07/06/2015  . 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 Alcohol abuse  04/01/2015    Past Surgical History  Procedure Laterality Date  . Cesarean section    . Esophagogastroduodenoscopy (egd) with propofol Left 07/07/2015    Procedure: ESOPHAGOGASTRODUODENOSCOPY (EGD) WITH PROPOFOL;  Surgeon: Scot Junobert T Elliott, MD;  [facility] ENDOSCOPY;  Service: Endoscopy;  Laterality: Left;  . Cesarean section    . Esophagogastroduodenoscopy (egd) with propofol N/A 09/05/2015    Procedure: ESOPHAGOGASTRODUODENOSCOPY (EGD) WITH PROPOFOL;  Surgeon: Midge Miniumarren Wohl, MD;  Location: [facility] ENDOSCOPY;  Service: Endoscopy;  Laterality: N/A;  . Dilation and curettage of uterus      x 2 TAB    Current Outpatient Rx  Name  Route  Sig  Dispense  Refill  . alprazolam (XANAX) 2 MG tablet   Oral   Take 2 mg by mouth 2 (two) times daily as needed for anxiety.          . citalopram (CELEXA) 40 MG tablet   Oral   Take 40 mg by mouth at bedtime.         Marland Kitchen. HYDROcodone-acetaminophen (NORCO) 7.5-325 MG tablet   Oral   Take 1 tablet by mouth every 6 (six) hours as needed for moderate pain. Patient not taking: Reported on 09/24/2015   15 tablet   0   . ondansetron (ZOFRAN ODT) 4 MG disintegrating tablet   Oral   Take 1 tablet (4 mg total) by mouth every 8 (eight) hours as needed for nausea or vomiting.   20 tablet   0   . ondansetron (ZOFRAN) 4 MG  tablet   Oral   Take 1 tablet (4 mg total) by mouth every 8 (eight) hours as needed for nausea or vomiting.   10 tablet   0   . pantoprazole (PROTONIX) 40 MG tablet   Oral   Take 1 tablet (40 mg total) by mouth 2 (two) times daily before a meal.   60 tablet   0   . QUEtiapine (SEROQUEL) 50 MG tablet   Oral   Take 100 mg by mouth at bedtime.         Marland Kitchen zolpidem (AMBIEN) 10 MG tablet   Oral   Take 10 mg by mouth at bedtime.           Allergies Review of patient's allergies indicates no known allergies.  Family History  Problem Relation Age of Onset  . Lung cancer Father   . Cancer Father     lung cancer  .  Alcohol abuse Father   . Lung cancer Paternal Aunt   . Cancer Paternal Aunt     lung  . Bone cancer Paternal Uncle   . Cancer Paternal Uncle     pancreatic  . Depression Mother   . Anxiety disorder Mother   . Arthritis Mother   . Hyperlipidemia Mother   . Hypertension Maternal Grandmother   . Diabetes Paternal Grandmother   . Heart disease Paternal Grandmother   . Hypertension Paternal Grandmother   . Asthma Son   . Vision loss Neg Hx   . COPD Neg Hx     Social History Social History  Substance Use Topics  . Smoking status: Current Some Day Smoker -- 1.00 packs/day for 17 years    Types: Cigarettes  . Smokeless tobacco: Never Used  . Alcohol Use: No     Comment: occasional    Review of Systems Constitutional: Negative for fever. Cardiovascular: Negative for chest pain. Respiratory: Negative for shortness of breath. Gastrointestinal: Positive for lower and epigastric abdominal pain. Positive for nausea or vomiting. Negative for diarrhea. Genitourinary: Negative for dysuria. Positive for vaginal spotting and lower abdominal cramping Musculoskeletal: Negative for back pain. Neurological: Negative for headache 10-point ROS otherwise negative.  ____________________________________________   PHYSICAL EXAM:  VITAL SIGNS: ED Triage Vitals  Enc Vitals Group     BP 02/02/16 0921 175/58 mmHg     Pulse Rate 02/02/16 0921 56     Resp 02/02/16 0921 18     Temp 02/02/16 0921 98.2 F (36.8 C)     Temp Source 02/02/16 0921 Oral     SpO2 02/02/16 0921 100 %     Weight 02/02/16 0921 173 lb (78.472 kg)     Height 02/02/16 0921  (1.575 m)     Head Cir --      Peak Flow --      Pain Score 02/02/16 0922 10     Pain Loc --      Pain Edu? --      Excl. in GC? --     Constitutional: Alert and oriented. Well appearing and in no distress. Eyes: Normal exam ENT   Head: Normocephalic and atraumatic.   Mouth/Throat: Mucous membranes are moist. Cardiovascular: Normal  rate, regular rhythm. No murmur Respiratory: Normal respiratory effort without tachypnea nor retractions. Breath sounds are clear Gastrointestinal: Soft, mild suprapubic tenderness to palpation, mild epigastric tenderness to palpation. No rebound or guarding. No distention. Abdomen otherwise benign. Musculoskeletal: Nontender with normal range of motion in all extremities. Neurologic:  Normal speech and language.  No gross focal neurologic deficits Skin:  Skin is warm, dry and intact.  Psychiatric: Mood and affect are normal. Speech and behavior are normal.   ____________________________________________   EKG reviewed and interpreted by me social sinus bradycardia 56 bpm, narrow QRS, normal axis, normal intervals, no ST changes. Normal EKG.  RADIOLOGY  Ultrasound within normal limits  ____________________________________________    INITIAL IMPRESSION / ASSESSMENT AND PLAN / ED COURSE  Pertinent labs & imaging results that were available during my care of the patient were reviewed by me and considered in my medical decision making (see chart for details).  Patient presents to the emergency department with lower and upper abdominal pain as well as 2-3 days of nausea, vomiting. Labs show 2+ ketones and urinalysis, urine otherwise normal. Labs including CBC, CMP, lipase are normal. Pregnancy test negative. We will IV hydrate, treat pain and nausea, procedure the pelvic exam wall closely monitoring in the emergency department. Abdominal exam shows very mild suprapubic and epigastric tenderness, no rebound or guarding.  Pelvic exam shows mild amount of bleeding, no cervicitis, IUD strings intact. Mild diffuse tenderness, not overly tender over the cervix or either adnexa. We'll proceed with a ultrasound to further evaluate. Patient feels she is at risk for sexually transmitted diseases due to a condom break several weeks ago, we will treat with Rocephin and azithromycin. Patient states the pain  and nausea are much improved after medication.   Ultrasound shows no acute finding. Wet prep has resulted positive for acute vaginitis. We will place the patient on Flagyl. She has received STD treatment in the emergency department. Patient states her nausea is much better at this time. We'll discharge with Zofran a short course of pain medication as well as Flagyl. Patient agreeable to plan we'll follow up with her primary care doctor.  ____________________________________________   FINAL CLINICAL IMPRESSION(S) / ED DIAGNOSES  Nausea vomiting Lower abdominal pain  Minna Antis, MD 02/02/16 1610

## 2016-02-02 NOTE — Discharge Instructions (Signed)

## 2016-02-02 NOTE — ED Notes (Signed)
Pt presents abdominal pain, vomiting and dysuria. Pt states has noticed blood in urine. Pt reports she has upper and lower abdominal pain. Pt denies diarrhea. Pt reports fever up to 102. Pt reports burning upon urination with urgency and frequency.

## 2016-02-21 ENCOUNTER — Emergency Department
Admission: EM | Admit: 2016-02-21 | Discharge: 2016-02-21 | Disposition: A | Discharge status: Home / Self Care | Payer: Medicaid Other | Attending: Emergency Medicine | Admitting: Emergency Medicine

## 2016-02-21 ENCOUNTER — Encounter: Payer: Self-pay | Admitting: *Deleted

## 2016-02-21 DIAGNOSIS — E669 Obesity, unspecified: Secondary | ICD-10-CM | POA: Diagnosis not present

## 2016-02-21 DIAGNOSIS — Z79899 Other long term (current) drug therapy: Secondary | ICD-10-CM | POA: Diagnosis not present

## 2016-02-21 DIAGNOSIS — E785 Hyperlipidemia, unspecified: Secondary | ICD-10-CM | POA: Insufficient documentation

## 2016-02-21 DIAGNOSIS — F329 Major depressive disorder, single episode, unspecified: Secondary | ICD-10-CM | POA: Diagnosis not present

## 2016-02-21 DIAGNOSIS — G8929 Other chronic pain: Secondary | ICD-10-CM

## 2016-02-21 DIAGNOSIS — F1010 Alcohol abuse, uncomplicated: Secondary | ICD-10-CM | POA: Insufficient documentation

## 2016-02-21 DIAGNOSIS — R109 Unspecified abdominal pain: Secondary | ICD-10-CM

## 2016-02-21 DIAGNOSIS — R112 Nausea with vomiting, unspecified: Secondary | ICD-10-CM

## 2016-02-21 DIAGNOSIS — F17210 Nicotine dependence, cigarettes, uncomplicated: Secondary | ICD-10-CM | POA: Insufficient documentation

## 2016-02-21 DIAGNOSIS — E876 Hypokalemia: Secondary | ICD-10-CM | POA: Diagnosis not present

## 2016-02-21 DIAGNOSIS — E041 Nontoxic single thyroid nodule: Secondary | ICD-10-CM | POA: Diagnosis not present

## 2016-02-21 DIAGNOSIS — K219 Gastro-esophageal reflux disease without esophagitis: Secondary | ICD-10-CM | POA: Diagnosis not present

## 2016-02-21 DIAGNOSIS — K2900 Acute gastritis without bleeding: Secondary | ICD-10-CM

## 2016-02-21 DIAGNOSIS — R1084 Generalized abdominal pain: Secondary | ICD-10-CM | POA: Diagnosis present

## 2016-02-21 LAB — CBC WITH DIFFERENTIAL/PLATELET
Basophils Absolute: 0 10*3/uL (ref 0–0.1)
Basophils Relative: 0 %
EOS PCT: 0 %
Eosinophils Absolute: 0 10*3/uL (ref 0–0.7)
HEMATOCRIT: 43.5 % (ref 35.0–47.0)
HEMOGLOBIN: 14.4 g/dL (ref 12.0–16.0)
LYMPHS ABS: 1.8 10*3/uL (ref 1.0–3.6)
LYMPHS PCT: 18 %
MCH: 27.6 pg (ref 26.0–34.0)
MCHC: 33.2 g/dL (ref 32.0–36.0)
MCV: 83.3 fL (ref 80.0–100.0)
Monocytes Absolute: 0.9 10*3/uL (ref 0.2–0.9)
Monocytes Relative: 9 %
NEUTROS ABS: 7.3 10*3/uL — AB (ref 1.4–6.5)
NEUTROS PCT: 73 %
Platelets: 392 10*3/uL (ref 150–440)
RBC: 5.23 MIL/uL — AB (ref 3.80–5.20)
RDW: 16.3 % — ABNORMAL HIGH (ref 11.5–14.5)
WBC: 10 10*3/uL (ref 3.6–11.0)

## 2016-02-21 LAB — COMPREHENSIVE METABOLIC PANEL
ALT: 15 U/L (ref 14–54)
AST: 22 U/L (ref 15–41)
Albumin: 5 g/dL (ref 3.5–5.0)
Alkaline Phosphatase: 75 U/L (ref 38–126)
Anion gap: 13 (ref 5–15)
BUN: 11 mg/dL (ref 6–20)
CHLORIDE: 101 mmol/L (ref 101–111)
CO2: 26 mmol/L (ref 22–32)
Calcium: 10.8 mg/dL — ABNORMAL HIGH (ref 8.9–10.3)
Creatinine, Ser: 0.86 mg/dL (ref 0.44–1.00)
GFR calc Af Amer: >60 mL/min (ref >60)
Glucose, Bld: 112 mg/dL — ABNORMAL HIGH (ref 65–99)
POTASSIUM: 3.4 mmol/L — AB (ref 3.5–5.1)
Sodium: 140 mmol/L (ref 135–145)
Total Bilirubin: 1 mg/dL (ref 0.3–1.2)
Total Protein: 9 g/dL — ABNORMAL HIGH (ref 6.5–8.1)

## 2016-02-21 LAB — LIPASE, BLOOD: LIPASE: 32 U/L (ref 11–51)

## 2016-02-21 MED ORDER — DIPHENHYDRAMINE HCL 50 MG/ML IJ SOLN
25.0000 mg | Freq: Once | INTRAMUSCULAR | Status: AC
  Administered 2016-02-21: 25 mg via INTRAVENOUS
  Filled 2016-02-21: qty 1

## 2016-02-21 MED ORDER — PROMETHAZINE HCL 25 MG RE SUPP: 25.0000 mg | Freq: Four times a day (QID) | RECTAL | Status: DC | PRN

## 2016-02-21 MED ORDER — POTASSIUM CHLORIDE CRYS ER 20 MEQ PO TBCR: 20.0000 meq | EXTENDED_RELEASE_TABLET | Freq: Every day | ORAL | Status: DC

## 2016-02-21 MED ORDER — ONDANSETRON HCL 4 MG/2ML IJ SOLN: 4.0000 mg | Freq: Once | INTRAMUSCULAR | Status: DC | PRN

## 2016-02-21 MED ORDER — SUCRALFATE 1 G PO TABS: 1.0000 g | ORAL_TABLET | Freq: Four times a day (QID) | ORAL | Status: DC | PRN

## 2016-02-21 MED ORDER — HALOPERIDOL LACTATE 5 MG/ML IJ SOLN
5.0000 mg | Freq: Once | INTRAMUSCULAR | Status: AC
  Administered 2016-02-21: 5 mg via INTRAVENOUS
  Filled 2016-02-21: qty 1

## 2016-02-21 MED ORDER — SODIUM CHLORIDE 0.9 % IV BOLUS (SEPSIS)
1000.0000 mL | INTRAVENOUS | Status: AC
  Administered 2016-02-21: 1000 mL via INTRAVENOUS

## 2016-02-21 NOTE — ED Notes (Signed)
Pt. Sleeping in room at this time.

## 2016-02-21 NOTE — ED Notes (Signed)
Pt presents via EMS w/ c/o epigastric pain x 20 since Saturday night at 2000. Pt denies being able to keep PO food or fluids down. Pt is tearful during triage. Pt has abrasion to R medial elbow sustained accidentally when running with children and fell. Pt c/o vaginal bleeding.

## 2016-02-21 NOTE — ED Notes (Signed)
Pt. States intermittent nausea and vomiting with epigastric pain starting this past Saturday night. Pt. Also states one episode of vaginal bleeding.

## 2016-02-21 NOTE — ED Notes (Signed)
Verbal report given to [place], RCharity fundraiser

## 2016-02-21 NOTE — ED Provider Notes (Addendum)
[facility] Emergency Department Provider Note  ____________________________________________  Time seen: Approximately 1:02 AM  I have reviewed the triage vital signs and the nursing notes.   HISTORY  Chief Complaint Abdominal Pain    HPI Laurie Frederick is a 40 y.o. female with well-documented chronic abdominal pain and multiple episodes of gastritis seen at multiple different emergency departments over the last year or more.  She presents by EMS tonight with acute onset of epigastric pain and multiple episodes of vomiting for greater than 24 hours.  She reports that she cannot keep anything down.  She is very anxious and upset and tearful.  She states she is just tired of going through this so many times and has seen many doctors who cannot find what is wrong with her.  He has not had any diarrhea, dysuria, or fever.  She also denies shortness of breath and chest pain.  She does have intermittent vaginal spotting or bleeding and this is also well documented in prior notes.  She was last seen in the emergency department here about 2 weeks ago and had a full workup including pelvic exam and transvaginal ultrasound which did not reveal any abnormal findings.  Although she states that the symptoms started a little over 24 hours ago, she then told me that she was seen by her primary care doctor last week and she was also having symptoms then.  It sounds as if this is fairly chronic as well as having recurrent acute on chronic gastritis episodes.  Past Medical History  Diagnosis Date  . Sleep apnea   . GERD (gastroesophageal reflux disease)   . Hyperlipidemia   . Depression   . Anxiety   . PTSD (post-traumatic stress disorder)   . Obesity   . Anemia   . Vitamin D deficiency   . Nontoxic uninodular goiter   . Multiple gastric ulcers   . H pylori ulcer     Patient Active Problem List   Diagnosis Date Noted  . Abdominal pain, epigastric   . Gastritis   .  Hypokalemia 08/16/2015  . Chronic prescription benzodiazepine use 08/16/2015  . Intractable nausea and vomiting 07/22/2015  . Epigastric pain   . Helicobacter positive gastritis 07/16/2015  . Gastritis and gastroduodenitis 07/16/2015  . Hyperlipidemia 07/13/2015  . Vitamin D deficiency 07/13/2015  . Thyroid nodule 07/13/2015  . Polyp of gallbladder 07/13/2015  . Liver nodule 07/13/2015  . Nausea & vomiting 07/13/2015  . Gastroenteritis 07/06/2015  . 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 Alcohol abuse 04/01/2015    Past Surgical History  Procedure Laterality Date  . Cesarean section    . Esophagogastroduodenoscopy (egd) with propofol Left 07/07/2015    Procedure: ESOPHAGOGASTRODUODENOSCOPY (EGD) WITH PROPOFOL;  Surgeon: Scot Jun, MD;  Location: [facility] ENDOSCOPY;  Service: Endoscopy;  Laterality: Left;  . Cesarean section    . Esophagogastroduodenoscopy (egd) with propofol N/A 09/05/2015    Procedure: ESOPHAGOGASTRODUODENOSCOPY (EGD) WITH PROPOFOL;  Surgeon: Midge Minium, MD;  Location: [facility] ENDOSCOPY;  Service: Endoscopy;  Laterality: N/A;  . Dilation and curettage of uterus      x 2 TAB    Current Outpatient Rx  Name  Route  Sig  Dispense  Refill  . alprazolam (XANAX) 2 MG tablet   Oral   Take 2 mg by mouth 2 (two) times daily as needed for anxiety.          . citalopram (CELEXA) 40 MG tablet   Oral   Take  40 mg by mouth at bedtime.         Marland Kitchen HYDROcodone-acetaminophen (NORCO/VICODIN) 5-325 MG tablet   Oral   Take 1 tablet by mouth every 4 (four) hours as needed for moderate pain.   8 tablet   0   . ondansetron (ZOFRAN ODT) 4 MG disintegrating tablet   Oral   Take 1 tablet (4 mg total) by mouth every 8 (eight) hours as needed for nausea or vomiting.   20 tablet   0   . ondansetron (ZOFRAN) 4 MG tablet   Oral   Take 1 tablet (4 mg total) by mouth every 8 (eight) hours as needed for nausea or vomiting.   10 tablet   0   . pantoprazole (PROTONIX) 40  MG tablet   Oral   Take 1 tablet (40 mg total) by mouth 2 (two) times daily before a meal.   60 tablet   0   . potassium chloride SA (KLOR-CON M20) 20 MEQ tablet   Oral   Take 1 tablet (20 mEq total) by mouth daily.   7 tablet   0   . promethazine (PHENERGAN) 25 MG suppository   Rectal   Place 1 suppository (25 mg total) rectally every 6 (six) hours as needed for nausea.   12 suppository   1   . QUEtiapine (SEROQUEL) 50 MG tablet   Oral   Take 100 mg by mouth at bedtime.         . sucralfate (CARAFATE) 1 g tablet   Oral   Take 1 tablet (1 g total) by mouth 4 (four) times daily as needed (for abdominal discomfort, nausea, and/or vomiting).   30 tablet   1   . zolpidem (AMBIEN) 10 MG tablet   Oral   Take 10 mg by mouth at bedtime.           Allergies Review of patient's allergies indicates no known allergies.  Family History  Problem Relation Age of Onset  . Lung cancer Father   . Cancer Father     lung cancer  . Alcohol abuse Father   . Lung cancer Paternal Aunt   . Cancer Paternal Aunt     lung  . Bone cancer Paternal Uncle   . Cancer Paternal Uncle     pancreatic  . Depression Mother   . Anxiety disorder Mother   . Arthritis Mother   . Hyperlipidemia Mother   . Hypertension Maternal Grandmother   . Diabetes Paternal Grandmother   . Heart disease Paternal Grandmother   . Hypertension Paternal Grandmother   . Asthma Son   . Vision loss Neg Hx   . COPD Neg Hx     Social History Social History  Substance Use Topics  . Smoking status: Current Some Day Smoker -- 1.00 packs/day for 17 years    Types: Cigarettes  . Smokeless tobacco: Never Used  . Alcohol Use: No     Comment: occasional    Review of Systems Constitutional: No fever/chills Eyes: No visual changes. ENT: No sore throat. Cardiovascular: Denies chest pain. Respiratory: Denies shortness of breath. Gastrointestinal: severe epigastric pain with frequent vomiting.  No  diarrhea. Genitourinary: Negative for dysuria.  Minimal vaginal bleeding. Musculoskeletal: Negative for back pain. Skin: Negative for rash. Neurological: Negative for headaches, focal weakness or numbness.  10-point ROS otherwise negative.  ____________________________________________   PHYSICAL EXAM:  VITAL SIGNS: ED Triage Vitals  Enc Vitals Group     BP 02/21/16 0026 148/94 mmHg  Pulse Rate 02/21/16 0026 83     Resp 02/21/16 0026 20     Temp 02/21/16 0026 98.6 F (37 C)     Temp Source 02/21/16 0026 Oral     SpO2 02/21/16 0017 100 %     Weight 02/21/16 0026 171 lb (77.565 kg)     Height 02/21/16 0026 5\' 3"  (1.6 m)     Head Cir --      Peak Flow --      Pain Score 02/21/16 0029 10     Pain Loc --      Pain Edu? --      Excl. in GC? --     Constitutional: Alert and oriented. Tearful and anxious.  Mild distress. Eyes: Conjunctivae are normal. PERRL. EOMI. Head: Atraumatic. Nose: No congestion/rhinnorhea. Mouth/Throat: Mucous membranes are moist.  Oropharynx non-erythematous. Neck: No stridor.  No meningeal signs.   Cardiovascular: Normal rate, regular rhythm. Good peripheral circulation. Grossly normal heart sounds.   Respiratory: Normal respiratory effort.  No retractions. Lungs CTAB. Gastrointestinal: Soft and obese.  Nontender in all quadrants including epigastrium. Musculoskeletal: No lower extremity tenderness nor edema. No gross deformities of extremities. Neurologic:  Normal speech and language. No gross focal neurologic deficits are appreciated.  Skin:  Skin is warm, dry and intact. No rash noted. Psychiatric: Mood and affect are tearful, anxious, upset.  ____________________________________________   LABS (all labs ordered are listed, but only abnormal results are displayed)  Labs Reviewed  COMPREHENSIVE METABOLIC PANEL - Abnormal; Notable for the following:    Potassium 3.4 (*)    Glucose, Bld 112 (*)    Calcium 10.8 (*)    Total Protein 9.0 (*)     All other components within normal limits  CBC WITH DIFFERENTIAL/PLATELET - Abnormal; Notable for the following:    RBC 5.23 (*)    RDW 16.3 (*)    Neutro Abs 7.3 (*)    All other components within normal limits  LIPASE, BLOOD   ____________________________________________  EKG  ED ECG REPORT I, Bellami Farrelly, the attending physician, personally viewed and interpreted this ECG.  Date: 02/21/2016 EKG Time: 00:36 Rate: 100 Rhythm: Borderline tachycardia QRS Axis: normal Intervals: normal.  QTc 464 ms ST/T Wave abnormalities: normal Conduction Disturbances: none Narrative Interpretation: unremarkable with some artifact due to the patient's movement  ____________________________________________  RADIOLOGY   No results found.  ____________________________________________   PROCEDURES  Procedure(s) performed: None  Critical Care performed: No ____________________________________________   INITIAL IMPRESSION / ASSESSMENT AND PLAN / ED COURSE  Pertinent labs & imaging results that were available during my care of the patient were reviewed by me and considered in my medical decision making (see chart for details).  I reviewed notes in the computer from [facility], [facility], and [facility].  She has well-documented chronic abdominal pain and has frequent episodes like this of acute gastritis that is refractory to everything except Dilaudid and Phenergan, although the last time [facility] was at [facility] she was able to be treated with out the Dilaudid and it was explained to her that this is not ideal treatment for chronic pain.  She has had extensive outpatient workups with CT scans, right upper quadrant ultrasound, vaginal ultrasounds, pelvic exams, repeat blood work, etc., nothing has ever identified the source of her discomfort.  She reports to me that she has been referred in the past to Dr. Mechele CollinElliott with gastroenterology was also unable to find any cause for her discomfort.  She  saw  her primary care doctor is weak and apparently she is referring her to a pain clinic  I explained to her that we will not be treating chronic abdominal pain with narcotics in the emergency department for [facility] policy but that we will treat her as a stridorous.  Given how refractory her symptoms are due to other courses of treatment, I will provide a liter of IV fluids, Haldol 5 mg, and Benadryl 25 mg.  I anticipate that this will successfully treat her refractory nausea and vomiting allow her to get a good night's rest and proceed with outpatient follow-up.  I explained the plan to her and she understands and agrees.  There is no indication for any repeat imaging at this time, and she talked to me, emotionally and anxious but without tears, all throughout my abdominal exam and she had no tenderness to palpation.  She has normal vital signs and is afebrile.  ----------------------------------------- 2:25 AM on 02/21/2016 -----------------------------------------  Patient's labs are reassuring.  Vital signs remained stable.  She is currently getting her IV fluids and resting comfortably.  She awoke to light voice and told me that she no longer feels nauseated and is having no more abdominal pain.  We will allow her to finish her fluids or at least continue them and then we will discharge her home for outpatient follow-up.  She verbally acknowledged this recommendation and said she will call her doctor tomorrow.   Of note, her potassium is only very slightly decreased from a normal level, and looking back over prior labs it seems fairly consistent for her, but I did prescribe a few oral supplements for her to take over the next few days to help bring it back up slowly. ____________________________________________  FINAL CLINICAL IMPRESSION(S) / ED DIAGNOSES  Final diagnoses:  Acute gastritis without hemorrhage  Chronic abdominal pain  Non-intractable vomiting with nausea, vomiting of  unspecified type  Hypokalemia, gastrointestinal losses      NEW MEDICATIONS STARTED DURING THIS VISIT:  New Prescriptions   POTASSIUM CHLORIDE SA (KLOR-CON M20) 20 MEQ TABLET    Take 1 tablet (20 mEq total) by mouth daily.   PROMETHAZINE (PHENERGAN) 25 MG SUPPOSITORY    Place 1 suppository (25 mg total) rectally every 6 (six) hours as needed for nausea.   SUCRALFATE (CARAFATE) 1 G TABLET    Take 1 tablet (1 g total) by mouth 4 (four) times daily as needed (for abdominal discomfort, nausea, and/or vomiting).      Note:  This document was prepared using Dragon voice recognition software and may include unintentional dictation errors.   Loleta Rose, MD 02/21/16 7829  Loleta Rose, MD 02/21/16 0230

## 2016-02-21 NOTE — ED Notes (Signed)
Pt. States she will follow up with PCP and ask about possible pain clinic.

## 2016-02-21 NOTE — Discharge Instructions (Signed)
As we discussed, you seemed to be suffering again from what is called acute gastritis on top of your chronic abdominal pain.  Fortunately we had stopped your vomiting and your pain with medications and IV fluids.  We encourage you to continue taking her regular home medications as well as those prescribed tonight.  Follow-up with your primary care doctor at the next available opportunity to discuss her ongoing symptoms including the chronic pain.  As per [facility] policy, we do not prescribe narcotic pain medication for chronic pain but your primary care doctor may be able to refer you to a pain clinic.   Gastritis, Adult Gastritis is soreness and swelling (inflammation) of the lining of the stomach. Gastritis can develop as a sudden onset (acute) or long-term (chronic) condition. If gastritis is not treated, it can lead to stomach bleeding and ulcers. CAUSES  Gastritis occurs when the stomach lining is weak or damaged. Digestive juices from the stomach then inflame the weakened stomach lining. The stomach lining may be weak or damaged due to viral or bacterial infections. One common bacterial infection is the Helicobacter pylori infection. Gastritis can also result from excessive alcohol consumption, taking certain medicines, or having too much acid in the stomach.  SYMPTOMS  In some cases, there are no symptoms. When symptoms are present, they may include:  Pain or a burning sensation in the upper abdomen.  Nausea.  Vomiting.  An uncomfortable feeling of fullness after eating. DIAGNOSIS  Your caregiver may suspect you have gastritis based on your symptoms and a physical exam. To determine the cause of your gastritis, your caregiver may perform the following:  Blood or stool tests to check for the H pylori bacterium.  Gastroscopy. A thin, flexible tube (endoscope) is passed down the esophagus and into the stomach. The endoscope has a light and camera on the end. Your caregiver uses the  endoscope to view the inside of the stomach.  Taking a tissue sample (biopsy) from the stomach to examine under a microscope. TREATMENT  Depending on the cause of your gastritis, medicines may be prescribed. If you have a bacterial infection, such as an H pylori infection, antibiotics may be given. If your gastritis is caused by too much acid in the stomach, H2 blockers or antacids may be given. Your caregiver may recommend that you stop taking aspirin, ibuprofen, or other nonsteroidal anti-inflammatory drugs (NSAIDs). HOME CARE INSTRUCTIONS  Only take over-the-counter or prescription medicines as directed by your caregiver.  If you were given antibiotic medicines, take them as directed. Finish them even if you start to feel better.  Drink enough fluids to keep your urine clear or pale yellow.  Avoid foods and drinks that make your symptoms worse, such as:  Caffeine or alcoholic drinks.  Chocolate.  Peppermint or mint flavorings.  Garlic and onions.  Spicy foods.  Citrus fruits, such as oranges, lemons, or limes.  Tomato-based foods such as sauce, chili, salsa, and pizza.  Fried and fatty foods.  Eat small, frequent meals instead of large meals. SEEK IMMEDIATE MEDICAL CARE IF:   You have black or dark red stools.  You vomit blood or material that looks like coffee grounds.  You are unable to keep fluids down.  Your abdominal pain gets worse.  You have a fever.  You do not feel better after 1 week.  You have any other questions or concerns. MAKE SURE YOU:  Understand these instructions.  Will watch your condition.  Will get help right away  if you are not doing well or get worse.   This information is not intended to replace advice given to you by your health care provider. Make sure you discuss any questions you have with your health care provider.   Document Released: 10/10/2001 Document Revised: 04/16/2012 Document Reviewed: 11/29/2011 Elsevier Interactive  Patient Education 2016 Elsevier Inc.   Nausea and Vomiting Nausea is a sick feeling that often comes before throwing up (vomiting). Vomiting is a reflex where stomach contents come out of your mouth. Vomiting can cause severe loss of body fluids (dehydration). Children and elderly adults can become dehydrated quickly, especially if they also have diarrhea. Nausea and vomiting are symptoms of a condition or disease. It is important to find the cause of your symptoms. CAUSES   Direct irritation of the stomach lining. This irritation can result from increased acid production (gastroesophageal reflux disease), infection, food poisoning, taking certain medicines (such as nonsteroidal anti-inflammatory drugs), alcohol use, or tobacco use.  Signals from the brain.These signals could be caused by a headache, heat exposure, an inner ear disturbance, increased pressure in the brain from injury, infection, a tumor, or a concussion, pain, emotional stimulus, or metabolic problems.  An obstruction in the gastrointestinal tract (bowel obstruction).  Illnesses such as diabetes, hepatitis, gallbladder problems, appendicitis, kidney problems, cancer, sepsis, atypical symptoms of a heart attack, or eating disorders.  Medical treatments such as chemotherapy and radiation.  Receiving medicine that makes you sleep (general anesthetic) during surgery. DIAGNOSIS Your caregiver may ask for tests to be done if the problems do not improve after a few days. Tests may also be done if symptoms are severe or if the reason for the nausea and vomiting is not clear. Tests may include:  Urine tests.  Blood tests.  Stool tests.  Cultures (to look for evidence of infection).  X-rays or other imaging studies. Test results can help your caregiver make decisions about treatment or the need for additional tests. TREATMENT You need to stay well hydrated. Drink frequently but in small amounts.You may wish to drink water,  sports drinks, clear broth, or eat frozen ice pops or gelatin dessert to help stay hydrated.When you eat, eating slowly may help prevent nausea.There are also some antinausea medicines that may help prevent nausea. HOME CARE INSTRUCTIONS   Take all medicine as directed by your caregiver.  If you do not have an appetite, do not force yourself to eat. However, you must continue to drink fluids.  If you have an appetite, eat a normal diet unless your caregiver tells you differently.  Eat a variety of complex carbohydrates (rice, wheat, potatoes, bread), lean meats, yogurt, fruits, and vegetables.  Avoid high-fat foods because they are more difficult to digest.  Drink enough water and fluids to keep your urine clear or pale yellow.  If you are dehydrated, ask your caregiver for specific rehydration instructions. Signs of dehydration may include:  Severe thirst.  Dry lips and mouth.  Dizziness.  Dark urine.  Decreasing urine frequency and amount.  Confusion.  Rapid breathing or pulse. SEEK IMMEDIATE MEDICAL CARE IF:   You have blood or brown flecks (like coffee grounds) in your vomit.  You have black or bloody stools.  You have a severe headache or stiff neck.  You are confused.  You have severe abdominal pain.  You have chest pain or trouble breathing.  You do not urinate at least once every 8 hours.  You develop cold or clammy skin.  You continue to  vomit for longer than 24 to 48 hours.  You have a fever. MAKE SURE YOU:   Understand these instructions.  Will watch your condition.  Will get help right away if you are not doing well or get worse.   This information is not intended to replace advice given to you by your health care provider. Make sure you discuss any questions you have with your health care provider.   Document Released: 10/16/2005 Document Revised: 01/08/2012 Document Reviewed: 03/15/2011 Elsevier Interactive Patient Education [employer].

## 2016-02-21 NOTE — ED Notes (Signed)
Pt. States chronic abdominal pain.  Pt. States intermittent nausea and vomiting.

## 2016-02-23 ENCOUNTER — Emergency Department: Payer: Medicaid Other

## 2016-02-23 ENCOUNTER — Emergency Department
Admission: EM | Admit: 2016-02-23 | Discharge: 2016-02-23 | Disposition: A | Discharge status: Home / Self Care | Payer: Medicaid Other | Attending: Emergency Medicine | Admitting: Emergency Medicine

## 2016-02-23 ENCOUNTER — Encounter: Payer: Self-pay | Admitting: Emergency Medicine

## 2016-02-23 DIAGNOSIS — E876 Hypokalemia: Secondary | ICD-10-CM | POA: Insufficient documentation

## 2016-02-23 DIAGNOSIS — E669 Obesity, unspecified: Secondary | ICD-10-CM | POA: Diagnosis not present

## 2016-02-23 DIAGNOSIS — Z79899 Other long term (current) drug therapy: Secondary | ICD-10-CM | POA: Diagnosis not present

## 2016-02-23 DIAGNOSIS — R1013 Epigastric pain: Secondary | ICD-10-CM | POA: Diagnosis not present

## 2016-02-23 DIAGNOSIS — E785 Hyperlipidemia, unspecified: Secondary | ICD-10-CM | POA: Diagnosis not present

## 2016-02-23 DIAGNOSIS — R112 Nausea with vomiting, unspecified: Secondary | ICD-10-CM

## 2016-02-23 DIAGNOSIS — G8929 Other chronic pain: Secondary | ICD-10-CM | POA: Diagnosis not present

## 2016-02-23 DIAGNOSIS — N939 Abnormal uterine and vaginal bleeding, unspecified: Secondary | ICD-10-CM | POA: Insufficient documentation

## 2016-02-23 DIAGNOSIS — F17210 Nicotine dependence, cigarettes, uncomplicated: Secondary | ICD-10-CM | POA: Diagnosis not present

## 2016-02-23 DIAGNOSIS — K219 Gastro-esophageal reflux disease without esophagitis: Secondary | ICD-10-CM | POA: Insufficient documentation

## 2016-02-23 DIAGNOSIS — F329 Major depressive disorder, single episode, unspecified: Secondary | ICD-10-CM | POA: Diagnosis not present

## 2016-02-23 DIAGNOSIS — Z8719 Personal history of other diseases of the digestive system: Secondary | ICD-10-CM | POA: Diagnosis not present

## 2016-02-23 LAB — COMPREHENSIVE METABOLIC PANEL
ALBUMIN: 4.7 g/dL (ref 3.5–5.0)
ALT: 14 U/L (ref 14–54)
ANION GAP: 13 (ref 5–15)
AST: 16 U/L (ref 15–41)
Alkaline Phosphatase: 67 U/L (ref 38–126)
BUN: 11 mg/dL (ref 6–20)
CHLORIDE: 95 mmol/L — AB (ref 101–111)
CO2: 30 mmol/L (ref 22–32)
Calcium: 9.6 mg/dL (ref 8.9–10.3)
Creatinine, Ser: 0.83 mg/dL (ref 0.44–1.00)
GFR calc Af Amer: >60 mL/min (ref >60)
GFR calc non Af Amer: >60 mL/min (ref >60)
GLUCOSE: 94 mg/dL (ref 65–99)
POTASSIUM: 2.6 mmol/L — AB (ref 3.5–5.1)
SODIUM: 138 mmol/L (ref 135–145)
Total Bilirubin: 0.6 mg/dL (ref 0.3–1.2)
Total Protein: 8.1 g/dL (ref 6.5–8.1)

## 2016-02-23 LAB — URINALYSIS COMPLETE WITH MICROSCOPIC (ARMC ONLY)
Bacteria, UA: NONE SEEN
Bilirubin Urine: NEGATIVE
Glucose, UA: NEGATIVE mg/dL
Nitrite: NEGATIVE
PH: 6 (ref 5.0–8.0)
PROTEIN: 100 mg/dL — AB
SPECIFIC GRAVITY, URINE: 1.029 (ref 1.005–1.030)

## 2016-02-23 LAB — POCT PREGNANCY, URINE: PREG TEST UR: NEGATIVE

## 2016-02-23 LAB — CBC
HCT: 42.4 % (ref 35.0–47.0)
Hemoglobin: 14 g/dL (ref 12.0–16.0)
MCH: 28 pg (ref 26.0–34.0)
MCHC: 33 g/dL (ref 32.0–36.0)
MCV: 84.9 fL (ref 80.0–100.0)
Platelets: 265 10*3/uL (ref 150–440)
RBC: 5 MIL/uL (ref 3.80–5.20)
RDW: 16.3 % — ABNORMAL HIGH (ref 11.5–14.5)
WBC: 7.9 10*3/uL (ref 3.6–11.0)

## 2016-02-23 LAB — LIPASE, BLOOD: LIPASE: 21 U/L (ref 11–51)

## 2016-02-23 LAB — TROPONIN I: Troponin I: <0.03 ng/mL (ref <0.031)

## 2016-02-23 MED ORDER — POTASSIUM CHLORIDE CRYS ER 20 MEQ PO TBCR
EXTENDED_RELEASE_TABLET | ORAL | Status: AC
  Filled 2016-02-23: qty 2

## 2016-02-23 MED ORDER — PROMETHAZINE HCL 25 MG PO TABS
25.0000 mg | ORAL_TABLET | Freq: Once | ORAL | Status: AC
  Administered 2016-02-23: 25 mg via ORAL
  Filled 2016-02-23: qty 1

## 2016-02-23 MED ORDER — KETOROLAC TROMETHAMINE 60 MG/2ML IM SOLN
60.0000 mg | Freq: Once | INTRAMUSCULAR | Status: AC
  Administered 2016-02-23: 60 mg via INTRAMUSCULAR
  Filled 2016-02-23: qty 2

## 2016-02-23 MED ORDER — PROMETHAZINE HCL 25 MG RE SUPP: 25.0000 mg | Freq: Four times a day (QID) | RECTAL | Status: DC | PRN

## 2016-02-23 MED ORDER — POTASSIUM CHLORIDE CRYS ER 20 MEQ PO TBCR
40.0000 meq | EXTENDED_RELEASE_TABLET | Freq: Once | ORAL | Status: AC
  Administered 2016-02-23: 40 meq via ORAL
  Filled 2016-02-23: qty 2

## 2016-02-23 MED ORDER — PROMETHAZINE HCL 25 MG PO TABS: 25.0000 mg | ORAL_TABLET | Freq: Four times a day (QID) | ORAL | Status: DC | PRN

## 2016-02-23 NOTE — Discharge Instructions (Signed)
Please take a clear liquid diet for the next 48 hours, then advance to a bland BRAT diet as described for 5 days.  Afterwards, follow the recommendation for the GERD diet to decrease pain and nausea.  Do not eat fried or fatty foods, or fast food.  Return to the emergency department if you develop worsening pain, fever, inability to keep down fluids, lightheadedness or fainting, or any other symptoms concerning to you.

## 2016-02-23 NOTE — ED Notes (Signed)
Pt is currently requesting patient relations. Pt is talking to mother on telephone and in no acute distress at this time. Pt has not vomited at this time since arriving to treatment room.

## 2016-02-23 NOTE — ED Notes (Signed)
Pt has eaten applesauce and drank water. Pt has not vomited to this point. Pt has spoken to pt relations and verbalized understanding of what staff has been teaching about home care. Pt verbalized still being upset about care but understanding follow up care to GI MD.

## 2016-02-23 NOTE — ED Notes (Signed)
Pt able top ambulate independently out of ED and appears to be in no acute distress. Pt verbalized desire for [place] sandwich before leaving treatment room  But was not provided one due to complaint of nausea throughout treatment. Pt given extra applesauce and was able to eat without difficulty upon d/c.

## 2016-02-23 NOTE — ED Notes (Signed)
Patient has not vomited since medication administration. Pt will attempt fluid challenge at this time.

## 2016-02-23 NOTE — ED Notes (Signed)
Pt presents to ED with c/o chest pain that started approx 2 hrs ago that is on the R side under her ribcage, with no radiations. Pt also c/o vaginal bleeding x 2-3 days, pt states at first was spotting and "now it's a lot". Pt c/o abdominal pain "for a few days", pt was seen here for vaginal bleeding and abdominal pain on 4/24. Pt tearful in triage.NAD noted at this time, respirations even and unlabored.

## 2016-02-23 NOTE — ED Provider Notes (Signed)
[facility] Emergency Department Provider Note  ____________________________________________  Time seen: Approximately 3:27 PM  I have reviewed the triage vital signs and the nursing notes.   HISTORY  Chief Complaint Chest Pain; Abdominal Pain; and Vaginal Bleeding    HPI Laurie Frederick is a 40 y.o. female nonpregnant, with a history of chronic epigastric pain, cyclical nausea and vomiting, and hypokalemia presenting with chest pain, epigastric pain, nausea and vomiting, and vaginal spotting. The patient reports that for several months she has had a "contracting ache" in the epigastrium that is associated with periods where she is unable to tolerate by mouth. She was seen here 4/24, 2 days ago, for the symptoms and was treated symptomatically in the emergency department, and given oral potassium supplementation for her chronic hypokalemia. She states that since she was discharged, she has been unable to keep down any food or liquid. This morning, she got off of work, and boought a Tax adviser which she then vomited.  She also reports that she has intermittently taken Carafate and Prilosec, without any improvement. She states that Zofran does not help her nausea, but that Phenergan does, unfortunately she only has Zofran at home. She has not had any fever, chills, diarrhea, change in character or severity of her pain, lightheadedness, shortness of breath or syncope. The patient has seen both Dr. Mechele Collin and Dr. Bluford Kaufmann, the gastroenterologists, with reassuring upper endoscopy and colonoscopy. She is also had intermittent vaginal spotting and heavy periods; she has a ParaGard in place. She was seen here 4/5 with a pelvic exam that was reassuring and an ultrasound which showed a uterine fibroid but no other acute abnormalities. She was treated with Rocephin and azithromycin for possible STD exposure during unprotected sex, and discharged home with Flagyl for bacterial vaginosis  which was found on exam. She is not having any new gynecologic symptoms today.She has followed with her primary care physician for all of these symptoms, and is in process of being referred to a pain clinic. She states that she does not have a long-term pain management plan with her primary care doctor at this time.   Past Medical History  Diagnosis Date  . Sleep apnea   . GERD (gastroesophageal reflux disease)   . Hyperlipidemia   . Depression   . Anxiety   . PTSD (post-traumatic stress disorder)   . Obesity   . Anemia   . Vitamin D deficiency   . Nontoxic uninodular goiter   . Multiple gastric ulcers   . H pylori ulcer     Patient Active Problem List   Diagnosis Date Noted  . Abdominal pain, epigastric   . Gastritis   . Hypokalemia 08/16/2015  . Chronic prescription benzodiazepine use 08/16/2015  . Intractable nausea and vomiting 07/22/2015  . Epigastric pain   . Helicobacter positive gastritis 07/16/2015  . Gastritis and gastroduodenitis 07/16/2015  . Hyperlipidemia 07/13/2015  . Vitamin D deficiency 07/13/2015  . Thyroid nodule 07/13/2015  . Polyp of gallbladder 07/13/2015  . Liver nodule 07/13/2015  . Nausea & vomiting 07/13/2015  . Gastroenteritis 07/06/2015  . 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 Alcohol abuse 04/01/2015    Past Surgical History  Procedure Laterality Date  . Cesarean section    . Esophagogastroduodenoscopy (egd) with propofol Left 07/07/2015    Procedure: ESOPHAGOGASTRODUODENOSCOPY (EGD) WITH PROPOFOL;  Surgeon: Scot Jun, MD;  Location: [facility] ENDOSCOPY;  Service: Endoscopy;  Laterality: Left;  . Cesarean section    . Esophagogastroduodenoscopy (egd)  with propofol N/A 09/05/2015    Procedure: ESOPHAGOGASTRODUODENOSCOPY (EGD) WITH PROPOFOL;  Surgeon: Midge Minium, MD;  Location: [facility] ENDOSCOPY;  Service: Endoscopy;  Laterality: N/A;  . Dilation and curettage of uterus      x 2 TAB    Current Outpatient Rx  Name  Route  Sig   Dispense  Refill  . alprazolam (XANAX) 2 MG tablet   Oral   Take 2 mg by mouth 2 (two) times daily as needed for anxiety.          . citalopram (CELEXA) 40 MG tablet   Oral   Take 40 mg by mouth at bedtime.         . pantoprazole (PROTONIX) 40 MG tablet   Oral   Take 1 tablet (40 mg total) by mouth 2 (two) times daily before a meal.   60 tablet   0   . potassium chloride SA (KLOR-CON M20) 20 MEQ tablet   Oral   Take 1 tablet (20 mEq total) by mouth daily.   7 tablet   0   . promethazine (PHENERGAN) 25 MG suppository   Rectal   Place 1 suppository (25 mg total) rectally every 6 (six) hours as needed for nausea.   12 suppository   1   . promethazine (PHENERGAN) 25 MG tablet   Oral   Take 1 tablet (25 mg total) by mouth every 6 (six) hours as needed for nausea or vomiting.   20 tablet   0   . QUEtiapine (SEROQUEL) 50 MG tablet   Oral   Take 100 mg by mouth at bedtime.         . sucralfate (CARAFATE) 1 g tablet   Oral   Take 1 tablet (1 g total) by mouth 4 (four) times daily as needed (for abdominal discomfort, nausea, and/or vomiting).   30 tablet   1   . zolpidem (AMBIEN) 10 MG tablet   Oral   Take 10 mg by mouth at bedtime.           Allergies Review of patient's allergies indicates no known allergies.  Family History  Problem Relation Age of Onset  . Lung cancer Father   . Cancer Father     lung cancer  . Alcohol abuse Father   . Lung cancer Paternal Aunt   . Cancer Paternal Aunt     lung  . Bone cancer Paternal Uncle   . Cancer Paternal Uncle     pancreatic  . Depression Mother   . Anxiety disorder Mother   . Arthritis Mother   . Hyperlipidemia Mother   . Hypertension Maternal Grandmother   . Diabetes Paternal Grandmother   . Heart disease Paternal Grandmother   . Hypertension Paternal Grandmother   . Asthma Son   . Vision loss Neg Hx   . COPD Neg Hx     Social History Social History  Substance Use Topics  . Smoking status:  Current Some Day Smoker -- 1.00 packs/day for 17 years    Types: Cigarettes  . Smokeless tobacco: Never Used  . Alcohol Use: No     Comment: occasional    Review of Systems Constitutional: No fever/chills. No lightheadedness or syncope. Positive chronic pain. Eyes: No visual changes. ENT: No sore throat. No congestion or rhinorrhea. Cardiovascular: Positive central chest pain. Denies palpitations. Respiratory: Denies shortness of breath.  No cough. Gastrointestinal: Positive epigastric abdominal pain.  Positive nausea, positive vomiting.  No diarrhea.  No constipation. Genitourinary: Negative  for dysuria. Positive for heavy periods and intermittent vaginal bleeding. Positive for intermittent pelvic pain. Musculoskeletal: Negative for back pain. Skin: Negative for rash. Neurological: Negative for headaches. No focal numbness, tingling or weakness.   10-point ROS otherwise negative.  ____________________________________________   PHYSICAL EXAM:  VITAL SIGNS: ED Triage Vitals  Enc Vitals Group     BP 02/23/16 1303 167/102 mmHg     Pulse Rate 02/23/16 1303 73     Resp 02/23/16 1303 18     Temp 02/23/16 1303 98.3 F (36.8 C)     Temp Source 02/23/16 1303 Oral     SpO2 02/23/16 1303 99 %     Weight 02/23/16 1303 172 lb (78.019 kg)     Height 02/23/16 1303  (1.6 m)     Head Cir --      Peak Flow --      Pain Score 02/23/16 1318 10     Pain Loc --      Pain Edu? --      Excl. in GC? --     Constitutional: Patient is sitting comfortably in the stretcher and able to move around the room without significant discomfort. She is tearful on exam and requesting admission. She is in no acute distress.  Eyes: Conjunctivae are normal.  EOMI. No scleral icterus. Head: Atraumatic. Nose: No congestion/rhinnorhea. Mouth/Throat: Mucous membranes are moist.  Neck: No stridor.  Supple.  No meningismus. Cardiovascular: Normal rate, regular rhythm. No murmurs, rubs or gallops.   Respiratory: Normal respiratory effort.  No accessory muscle use or retractions. Lungs CTAB.  No wheezes, rales or ronchi. Gastrointestinal: Abdomen is soft and nondistended. On my exam, the patient does not exhibit any discomfort when I palpate her abdomen, but after I finish my exam she states that she had pain when I palpated her epigastrium..  No guarding or rebound.  No peritoneal signs. Musculoskeletal: No LE edema. No ttp in the calves or palpable cords.  Negative Homan's sign. Neurologic:  A&Ox3.  Speech is clear.  Face and smile are symmetric.  EOMI.  Moves all extremities well. Skin:  Skin is warm, dry and intact. No rash noted. Psychiatric: Mood is depressed. Speech and behavior are normal.  Normal judgement.  ____________________________________________   LABS (all labs ordered are listed, but only abnormal results are displayed)  Labs Reviewed  CBC - Abnormal; Notable for the following:    RDW 16.3 (*)    All other components within normal limits  COMPREHENSIVE METABOLIC PANEL - Abnormal; Notable for the following:    Potassium 2.6 (*)    Chloride 95 (*)    All other components within normal limits  URINALYSIS COMPLETEWITH MICROSCOPIC ([facility] ONLY) - Abnormal; Notable for the following:    Color, Urine YELLOW (*)    APPearance CLOUDY (*)    Ketones, ur TRACE (*)    Hgb urine dipstick 3+ (*)    Protein, ur 100 (*)    Leukocytes, UA TRACE (*)    Squamous Epithelial / LPF 0-5 (*)    All other components within normal limits  TROPONIN I  LIPASE, BLOOD  POCT PREGNANCY, URINE  POC URINE PREG, ED   ____________________________________________  EKG  ED ECG REPORT I, Rockne Menghini, the attending physician, personally viewed and interpreted this ECG.   Date: 02/23/2016  EKG Time: 1301  Rate: 79  Rhythm: normal sinus rhythm  Axis: Normal  Intervals:none  ST&T Change: No ST elevation.  ____________________________________________  RADIOLOGY  No results  found.  ____________________________________________   PROCEDURES  Procedure(s) performed: None  Critical Care performed: No ____________________________________________   INITIAL IMPRESSION / ASSESSMENT AND PLAN / ED COURSE  Pertinent labs & imaging results that were available during my care of the patient were reviewed by me and considered in my medical decision making (see chart for details).  40 y.o. female with chronic epigastric pain associated with cyclical vomiting, as well as chronic vaginal spotting presenting with these symptoms that are unchanged in character or severity today. The patient is mildly hypertensive on exam but otherwise has stable vital signs and is afebrile. Her cardiopulmonary and abdominal examinations are reassuring. Pelvic exam is deferred as she has no new pelvic symptoms and had full evaluation less than 1 month ago.  The most likely etiology of the patient's pain is chronic pain with chronic vomiting. She may have an acute gastrointestinal illness, either viral or foodborne. I do not see any evidence on my abdominal examination of an acute surgical pathology such as appendicitis or obstruction. She may also have some reflux, but his RAD being treated appropriately for this. I will evaluate her electrolytes, his hat she has had hypokalemia in the past, and rule out any new symptoms such as urinary tract infection. I will also plan to give the patient written information about dietary restrictions that may help her symptoms, as eating hamburgers may be exacerbating her nausea vomiting and pain.  The patient's vaginal spotting may be due to her IUD, it may also reflect some abnormalities due to her uterine fibroid. I have encouraged her to follow up with a gynecologist and will give her the number and the name for the person who is on-call for us today.  For the patient's pain, she will need to make a long-term pain management plan with her primary care  physician as she is awaiting pain clinic management. I will encourage her to continue the Prilosec and Carafate, and additionally give her prescription for oral tablets and suppositories of Phenergan as this seems to work better for her than Zofran. Today, there is no evidence that the patient is significantly dehydrated or requires admission to the hospital.  The patient has asked to speak with patient visitor relations, as she feels that she needs to be admitted to the hospital. I will help her speak with them, but she understands that at this time I do not have any indication for admission. She also understands that I have reviewed with her all of the outpatient follow-up that will be important for continued management and further evaluation of her symptoms.  ----------------------------------------- 3:50 PM on 02/23/2016 -----------------------------------------  The patient's lab work is reassuring, and we're treating her symptomatically. When she is able to tolerate by mouth  ____________________________________________  FINAL CLINICAL IMPRESSION(S) / ED DIAGNOSES  Final diagnoses:  Hypokalemia  Non-intractable vomiting with nausea, vomiting of unspecified type  Chronic epigastric pain  Vaginal spotting      NEW MEDICATIONS STARTED DURING THIS VISIT:  New Prescriptions   PROMETHAZINE (PHENERGAN) 25 MG SUPPOSITORY    Place 1 suppository (25 mg total) rectally every 6 (six) hours as needed for nausea.   PROMETHAZINE (PHENERGAN) 25 MG TABLET    Take 1 tablet (25 mg total) by mouth every 6 (six) hours as needed for nausea or vomiting.     Rockne MenghiniAnne-Caroline Kennedey Digilio, MD 02/23/16 1550

## 2016-02-23 NOTE — ED Notes (Signed)
Potassium pills dropped with cup. New pills drawn from pixis.

## 2016-03-13 ENCOUNTER — Observation Stay
Admission: EM | Admit: 2016-03-13 | Discharge: 2016-03-15 | Disposition: A | Discharge status: Home / Self Care | Payer: Medicaid Other | Attending: Internal Medicine | Admitting: Internal Medicine

## 2016-03-13 ENCOUNTER — Emergency Department: Payer: Medicaid Other

## 2016-03-13 ENCOUNTER — Encounter: Payer: Self-pay | Admitting: Emergency Medicine

## 2016-03-13 DIAGNOSIS — K2970 Gastritis, unspecified, without bleeding: Secondary | ICD-10-CM | POA: Diagnosis present

## 2016-03-13 DIAGNOSIS — Z79899 Other long term (current) drug therapy: Secondary | ICD-10-CM | POA: Insufficient documentation

## 2016-03-13 DIAGNOSIS — N390 Urinary tract infection, site not specified: Secondary | ICD-10-CM | POA: Insufficient documentation

## 2016-03-13 DIAGNOSIS — R1111 Vomiting without nausea: Secondary | ICD-10-CM

## 2016-03-13 DIAGNOSIS — D649 Anemia, unspecified: Secondary | ICD-10-CM | POA: Diagnosis not present

## 2016-03-13 DIAGNOSIS — K2990 Gastroduodenitis, unspecified, without bleeding: Secondary | ICD-10-CM | POA: Diagnosis not present

## 2016-03-13 DIAGNOSIS — K7689 Other specified diseases of liver: Secondary | ICD-10-CM | POA: Insufficient documentation

## 2016-03-13 DIAGNOSIS — E049 Nontoxic goiter, unspecified: Secondary | ICD-10-CM | POA: Diagnosis not present

## 2016-03-13 DIAGNOSIS — F1010 Alcohol abuse, uncomplicated: Secondary | ICD-10-CM | POA: Diagnosis not present

## 2016-03-13 DIAGNOSIS — K219 Gastro-esophageal reflux disease without esophagitis: Secondary | ICD-10-CM | POA: Insufficient documentation

## 2016-03-13 DIAGNOSIS — R197 Diarrhea, unspecified: Secondary | ICD-10-CM | POA: Insufficient documentation

## 2016-03-13 DIAGNOSIS — K529 Noninfective gastroenteritis and colitis, unspecified: Secondary | ICD-10-CM | POA: Diagnosis not present

## 2016-03-13 DIAGNOSIS — Z8711 Personal history of peptic ulcer disease: Secondary | ICD-10-CM | POA: Insufficient documentation

## 2016-03-13 DIAGNOSIS — Z6831 Body mass index (BMI) 31.0-31.9, adult: Secondary | ICD-10-CM | POA: Diagnosis not present

## 2016-03-13 DIAGNOSIS — K824 Cholesterolosis of gallbladder: Secondary | ICD-10-CM | POA: Insufficient documentation

## 2016-03-13 DIAGNOSIS — F4310 Post-traumatic stress disorder, unspecified: Secondary | ICD-10-CM | POA: Insufficient documentation

## 2016-03-13 DIAGNOSIS — F329 Major depressive disorder, single episode, unspecified: Secondary | ICD-10-CM | POA: Diagnosis not present

## 2016-03-13 DIAGNOSIS — G43A0 Cyclical vomiting, not intractable: Principal | ICD-10-CM | POA: Insufficient documentation

## 2016-03-13 DIAGNOSIS — F419 Anxiety disorder, unspecified: Secondary | ICD-10-CM

## 2016-03-13 DIAGNOSIS — R1110 Vomiting, unspecified: Secondary | ICD-10-CM | POA: Insufficient documentation

## 2016-03-13 DIAGNOSIS — F418 Other specified anxiety disorders: Secondary | ICD-10-CM | POA: Insufficient documentation

## 2016-03-13 DIAGNOSIS — G4730 Sleep apnea, unspecified: Secondary | ICD-10-CM | POA: Insufficient documentation

## 2016-03-13 DIAGNOSIS — R109 Unspecified abdominal pain: Secondary | ICD-10-CM | POA: Insufficient documentation

## 2016-03-13 DIAGNOSIS — E785 Hyperlipidemia, unspecified: Secondary | ICD-10-CM | POA: Insufficient documentation

## 2016-03-13 DIAGNOSIS — R1013 Epigastric pain: Secondary | ICD-10-CM | POA: Diagnosis present

## 2016-03-13 DIAGNOSIS — R112 Nausea with vomiting, unspecified: Secondary | ICD-10-CM | POA: Diagnosis not present

## 2016-03-13 DIAGNOSIS — F17210 Nicotine dependence, cigarettes, uncomplicated: Secondary | ICD-10-CM | POA: Diagnosis not present

## 2016-03-13 DIAGNOSIS — K920 Hematemesis: Secondary | ICD-10-CM | POA: Insufficient documentation

## 2016-03-13 DIAGNOSIS — E669 Obesity, unspecified: Secondary | ICD-10-CM | POA: Insufficient documentation

## 2016-03-13 DIAGNOSIS — E559 Vitamin D deficiency, unspecified: Secondary | ICD-10-CM | POA: Diagnosis not present

## 2016-03-13 DIAGNOSIS — E876 Hypokalemia: Secondary | ICD-10-CM | POA: Diagnosis not present

## 2016-03-13 LAB — COMPREHENSIVE METABOLIC PANEL
ALBUMIN: 4.7 g/dL (ref 3.5–5.0)
ALT: 16 U/L (ref 14–54)
ANION GAP: 8 (ref 5–15)
AST: 19 U/L (ref 15–41)
Alkaline Phosphatase: 70 U/L (ref 38–126)
BUN: 6 mg/dL (ref 6–20)
CHLORIDE: 105 mmol/L (ref 101–111)
CO2: 27 mmol/L (ref 22–32)
Calcium: 9.5 mg/dL (ref 8.9–10.3)
Creatinine, Ser: 0.75 mg/dL (ref 0.44–1.00)
GFR calc Af Amer: >60 mL/min (ref >60)
GFR calc non Af Amer: >60 mL/min (ref >60)
GLUCOSE: 101 mg/dL — AB (ref 65–99)
Potassium: 3.8 mmol/L (ref 3.5–5.1)
Sodium: 140 mmol/L (ref 135–145)
Total Bilirubin: 0.4 mg/dL (ref 0.3–1.2)
Total Protein: 8.3 g/dL — ABNORMAL HIGH (ref 6.5–8.1)

## 2016-03-13 LAB — URINALYSIS COMPLETE WITH MICROSCOPIC (ARMC ONLY)
Bilirubin Urine: NEGATIVE
Glucose, UA: NEGATIVE mg/dL
KETONES UR: NEGATIVE mg/dL
Nitrite: NEGATIVE
PH: 6 (ref 5.0–8.0)
PROTEIN: 100 mg/dL — AB
SPECIFIC GRAVITY, URINE: 1.024 (ref 1.005–1.030)

## 2016-03-13 LAB — TROPONIN I: Troponin I: <0.03 ng/mL (ref <0.031)

## 2016-03-13 LAB — LIPASE, BLOOD: LIPASE: 22 U/L (ref 11–51)

## 2016-03-13 LAB — CBC
HEMATOCRIT: 39.9 % (ref 35.0–47.0)
HEMOGLOBIN: 13.1 g/dL (ref 12.0–16.0)
MCH: 28.1 pg (ref 26.0–34.0)
MCHC: 32.9 g/dL (ref 32.0–36.0)
MCV: 85.3 fL (ref 80.0–100.0)
Platelets: 317 10*3/uL (ref 150–440)
RBC: 4.67 MIL/uL (ref 3.80–5.20)
RDW: 16.8 % — AB (ref 11.5–14.5)
WBC: 6.2 10*3/uL (ref 3.6–11.0)

## 2016-03-13 LAB — POCT PREGNANCY, URINE: PREG TEST UR: NEGATIVE

## 2016-03-13 MED ORDER — ONDANSETRON HCL 4 MG/2ML IJ SOLN
4.0000 mg | Freq: Once | INTRAMUSCULAR | Status: AC
  Administered 2016-03-13: 4 mg via INTRAVENOUS

## 2016-03-13 MED ORDER — SUCRALFATE 1 G PO TABS
1.0000 g | ORAL_TABLET | Freq: Four times a day (QID) | ORAL | Status: DC | PRN
  Administered 2016-03-14: 09:00:00 1 g via ORAL
  Filled 2016-03-13: qty 1

## 2016-03-13 MED ORDER — ONDANSETRON HCL 4 MG/2ML IJ SOLN
4.0000 mg | Freq: Four times a day (QID) | INTRAMUSCULAR | Status: DC | PRN
  Filled 2016-03-13: qty 2

## 2016-03-13 MED ORDER — ALPRAZOLAM 1 MG PO TABS
2.0000 mg | ORAL_TABLET | Freq: Two times a day (BID) | ORAL | Status: DC | PRN
  Administered 2016-03-13: 2 mg via ORAL
  Filled 2016-03-13: qty 2

## 2016-03-13 MED ORDER — CEFTRIAXONE SODIUM 1 G IJ SOLR
1.0000 g | INTRAMUSCULAR | Status: DC
  Administered 2016-03-13 – 2016-03-15 (×3): 1 g via INTRAVENOUS
  Filled 2016-03-13 (×3): qty 10

## 2016-03-13 MED ORDER — HYDROMORPHONE HCL 1 MG/ML IJ SOLN
1.0000 mg | Freq: Once | INTRAMUSCULAR | Status: AC
  Administered 2016-03-13: 1 mg via INTRAVENOUS
  Filled 2016-03-13: qty 1

## 2016-03-13 MED ORDER — MORPHINE SULFATE (PF) 2 MG/ML IV SOLN
2.0000 mg | INTRAVENOUS | Status: DC | PRN
  Administered 2016-03-13 – 2016-03-14 (×2): 2 mg via INTRAVENOUS
  Filled 2016-03-13 (×3): qty 1

## 2016-03-13 MED ORDER — PROCHLORPERAZINE EDISYLATE 5 MG/ML IJ SOLN
10.0000 mg | Freq: Four times a day (QID) | INTRAMUSCULAR | Status: DC | PRN
  Administered 2016-03-13 – 2016-03-14 (×5): 10 mg via INTRAVENOUS
  Filled 2016-03-13 (×4): qty 2

## 2016-03-13 MED ORDER — HEPARIN SODIUM (PORCINE) 5000 UNIT/ML IJ SOLN: 5000.0000 [IU] | Freq: Three times a day (TID) | INTRAMUSCULAR | Status: DC

## 2016-03-13 MED ORDER — ONDANSETRON HCL 4 MG/2ML IJ SOLN
INTRAMUSCULAR | Status: AC
  Administered 2016-03-13: 4 mg via INTRAVENOUS
  Filled 2016-03-13: qty 2

## 2016-03-13 MED ORDER — MORPHINE SULFATE (PF) 4 MG/ML IV SOLN
4.0000 mg | Freq: Once | INTRAVENOUS | Status: AC
  Administered 2016-03-13: 4 mg via INTRAVENOUS

## 2016-03-13 MED ORDER — PANTOPRAZOLE SODIUM 40 MG PO TBEC
40.0000 mg | DELAYED_RELEASE_TABLET | Freq: Two times a day (BID) | ORAL | Status: DC
  Administered 2016-03-13 – 2016-03-14 (×3): 40 mg via ORAL
  Filled 2016-03-13 (×3): qty 1

## 2016-03-13 MED ORDER — GI COCKTAIL ~~LOC~~
30.0000 mL | Freq: Once | ORAL | Status: AC
  Administered 2016-03-13: 30 mL via ORAL
  Filled 2016-03-13: qty 30

## 2016-03-13 MED ORDER — ZOLPIDEM TARTRATE 5 MG PO TABS
5.0000 mg | ORAL_TABLET | Freq: Every day | ORAL | Status: DC
  Administered 2016-03-13 – 2016-03-14 (×2): 5 mg via ORAL
  Filled 2016-03-13 (×2): qty 1

## 2016-03-13 MED ORDER — CITALOPRAM HYDROBROMIDE 20 MG PO TABS
40.0000 mg | ORAL_TABLET | Freq: Every day | ORAL | Status: DC
  Administered 2016-03-13 – 2016-03-14 (×2): 40 mg via ORAL
  Filled 2016-03-13 (×2): qty 2

## 2016-03-13 MED ORDER — QUETIAPINE FUMARATE 25 MG PO TABS
100.0000 mg | ORAL_TABLET | Freq: Every day | ORAL | Status: DC
  Administered 2016-03-13 – 2016-03-14 (×2): 100 mg via ORAL
  Filled 2016-03-13 (×2): qty 4

## 2016-03-13 MED ORDER — MORPHINE SULFATE (PF) 4 MG/ML IV SOLN
4.0000 mg | Freq: Once | INTRAVENOUS | Status: AC
  Administered 2016-03-13: 4 mg via INTRAVENOUS
  Filled 2016-03-13: qty 1

## 2016-03-13 MED ORDER — PROMETHAZINE HCL 25 MG PO TABS: 25.0000 mg | ORAL_TABLET | Freq: Four times a day (QID) | ORAL | Status: DC | PRN

## 2016-03-13 MED ORDER — MORPHINE SULFATE (PF) 4 MG/ML IV SOLN
INTRAVENOUS | Status: AC
  Administered 2016-03-13: 4 mg via INTRAVENOUS
  Filled 2016-03-13: qty 1

## 2016-03-13 MED ORDER — ONDANSETRON HCL 4 MG/2ML IJ SOLN: 4.0000 mg | Freq: Once | INTRAMUSCULAR | Status: DC

## 2016-03-13 NOTE — Care Management (Addendum)
Patient has verbalized that [facility]  and she has chosen [facility] [place].  She has had a second injection of morphine and so far her abdominal /epigastric pain has not resolved.  It has been determined that patient  will be placed in observation with gi consult.  There apparently is much discussion regarding whether her gi sx are related to her anxiety.  Her PCP referred her to [facility] who either could not see her until September due to her medicaid or does not accept medicaid.  It is discussed that maybe patient needs more intense psych follow up at discharge. Discussed placing patient in observation for sx management and work up for gi pain. Also discussed benefits of psych consult.

## 2016-03-13 NOTE — ED Notes (Signed)
UPPER ABD PAIN AND VOMITING SINCE YESTERDAY AFTER EATING FOR MOTHERS DAY.

## 2016-03-13 NOTE — Care Management (Addendum)
CM finds patient standing at the nurses station crying.  She is complaining of epigastric pain and is "begging" to be scoped today.   She has had 9 previous visits to [facility] ED in the last 6 months and it is reported shell also goes [facility].  Patient says that she uses [facility] and "I do take my Protonix."  She does repeat more than once that her pharmacy [facility]  [facility] acknowledges that patient has medications filled - especially her psych meds.  She refills her xanax most regularly over seroqual or Celexa.   She says she is current with her PCP Franco Nonesheryl Lindley at [facility] [phone] - unable to confirm at present because there is no answer at practice.  It may be lunch hours.  Patient says that she is followed by Dr Omelia BlackwaterHeaden at [facility] but not able to make contact with the clinic.  It appears the number on line is incorrect.  Spoke with [facility] [place] [facility].  There are no claims for patient's PCP.  In order for patient to be seen by gastroenterology, she will have to be seen by her pcp and the pcp must make the referral.  Discussed that the patient is using the ED for primary care.  Agency has not been able to make contact with patient. Attempted to call patient's mother (with patient's permission) but the calls were disconnected after the call was answered on 4 attempts. Informed that patient has been given gi cocktail and IV morphine/zofran.  CM saw that patient had not taken her gi cocktail- "because I think it will  make me throw up."  Discussed rationale for taking the medication and and patient complied.  She is adamant that she is not drug seeking.

## 2016-03-13 NOTE — ED Notes (Signed)
This RN into room; pt reports nausea. Pt given more IV zofran. Pt yells at nurse "get the fuck out of my face, you aren't listening to what I'm telling you." Pt refusing to finish drinking GI Cocktail. Rest of medication wasted.

## 2016-03-13 NOTE — ED Notes (Signed)
Pt reports a sudden onset of abdominal pain with nausea and vomiting last night after eating dinner

## 2016-03-13 NOTE — Progress Notes (Signed)
Notified Dr. Madelon LipsVacchani that patient wants pain medicine and nausea medicine btu does not want zofran or morphine. MD acknowledged. Will enter orders.

## 2016-03-13 NOTE — Progress Notes (Signed)
°   03/13/16 1900  Clinical Encounter Type  Visited With Patient  Visit Type Initial  Referral From Nurse  Consult/Referral To Chaplain  Request for Advance Directives.  Patient was sleepy so we briefly discussed AD and I left papers for her to review at a later time. Chaplain [employer] Ext [phone]

## 2016-03-13 NOTE — ED Provider Notes (Signed)
[facility] Emergency Department Provider Note   ____________________________________________  Time seen: Approximately 8:15 AM I have reviewed the triage vital signs and the triage nursing note.  HISTORY  Chief Complaint Abdominal Pain and Emesis   Historian Patient  HPI Laurie Frederick is a 40 y.o. female with history of chronic epigastric abdominal pain, cyclical vomiting, prior diagnosis of gastritis and gastroduodenitis, who is here stating that she had acute onset of epigastric pain associated with nausea and vomiting yesterday around 9 PM after Mother's Day dinner which included fried chicken and cabbage.  She is reporting the pain is severe and similar to prior episodes.  She sees her primary care doctor did refer her to pain management, however the pain management clinic stated that they were not having any openings for Medicaid patients until September. She is tearful and upset about what she is pursuing the meantime. She states that she is follow with GI, last about 6 months ago. She is requesting a repeat endoscopy to look for gastritis.  She states that she is under a lot of stress. She states that she is taking her psychiatric medications.    Past Medical History  Diagnosis Date  . Sleep apnea   . GERD (gastroesophageal reflux disease)   . Hyperlipidemia   . Depression   . Anxiety   . PTSD (post-traumatic stress disorder)   . Obesity   . Anemia   . Vitamin D deficiency   . Nontoxic uninodular goiter   . Multiple gastric ulcers   . H pylori ulcer     Patient Active Problem List   Diagnosis Date Noted  . Abdominal pain, epigastric   . Gastritis   . Hypokalemia 08/16/2015  . Chronic prescription benzodiazepine use 08/16/2015  . Intractable nausea and vomiting 07/22/2015  . Epigastric pain   . Helicobacter positive gastritis 07/16/2015  . Gastritis and gastroduodenitis 07/16/2015  . Hyperlipidemia 07/13/2015  . Vitamin D deficiency  07/13/2015  . Thyroid nodule 07/13/2015  . Polyp of gallbladder 07/13/2015  . Liver nodule 07/13/2015  . Nausea & vomiting 07/13/2015  . Gastroenteritis 07/06/2015  . 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 Alcohol abuse 04/01/2015    Past Surgical History  Procedure Laterality Date  . Cesarean section    . Esophagogastroduodenoscopy (egd) with propofol Left 07/07/2015    Procedure: ESOPHAGOGASTRODUODENOSCOPY (EGD) WITH PROPOFOL;  Surgeon: Scot Jun, MD;  Location: [facility] ENDOSCOPY;  Service: Endoscopy;  Laterality: Left;  . Cesarean section    . Esophagogastroduodenoscopy (egd) with propofol N/A 09/05/2015    Procedure: ESOPHAGOGASTRODUODENOSCOPY (EGD) WITH PROPOFOL;  Surgeon: Midge Minium, MD;  Location: [facility] ENDOSCOPY;  Service: Endoscopy;  Laterality: N/A;  . Dilation and curettage of uterus      x 2 TAB    Current Outpatient Rx  Name  Route  Sig  Dispense  Refill  . alprazolam (XANAX) 2 MG tablet   Oral   Take 2 mg by mouth 2 (two) times daily as needed for anxiety.          . citalopram (CELEXA) 40 MG tablet   Oral   Take 40 mg by mouth at bedtime.         . pantoprazole (PROTONIX) 40 MG tablet   Oral   Take 1 tablet (40 mg total) by mouth 2 (two) times daily before a meal.   60 tablet   0   . potassium chloride SA (KLOR-CON M20) 20 MEQ tablet   Oral  Take 1 tablet (20 mEq total) by mouth daily.   7 tablet   0   . promethazine (PHENERGAN) 25 MG suppository   Rectal   Place 1 suppository (25 mg total) rectally every 6 (six) hours as needed for nausea.   12 suppository   1   . promethazine (PHENERGAN) 25 MG tablet   Oral   Take 1 tablet (25 mg total) by mouth every 6 (six) hours as needed for nausea or vomiting.   20 tablet   0   . QUEtiapine (SEROQUEL) 50 MG tablet   Oral   Take 100 mg by mouth at bedtime.         . sucralfate (CARAFATE) 1 g tablet   Oral   Take 1 tablet (1 g total) by mouth 4 (four) times daily as needed (for abdominal  discomfort, nausea, and/or vomiting).   30 tablet   1   . zolpidem (AMBIEN) 10 MG tablet   Oral   Take 10 mg by mouth at bedtime.           Allergies Review of patient's allergies indicates no known allergies.  Family History  Problem Relation Age of Onset  . Lung cancer Father   . Cancer Father     lung cancer  . Alcohol abuse Father   . Lung cancer Paternal Aunt   . Cancer Paternal Aunt     lung  . Bone cancer Paternal Uncle   . Cancer Paternal Uncle     pancreatic  . Depression Mother   . Anxiety disorder Mother   . Arthritis Mother   . Hyperlipidemia Mother   . Hypertension Maternal Grandmother   . Diabetes Paternal Grandmother   . Heart disease Paternal Grandmother   . Hypertension Paternal Grandmother   . Asthma Son   . Vision loss Neg Hx   . COPD Neg Hx     Social History Social History  Substance Use Topics  . Smoking status: Current Some Day Smoker -- 1.00 packs/day for 17 years    Types: Cigarettes  . Smokeless tobacco: Never Used  . Alcohol Use: No     Comment: occasional    Review of Systems  Constitutional: Negative for fever. Eyes: Negative for visual changes. ENT: Negative for sore throat. Cardiovascular: Negative for chest pain. Respiratory: Negative for shortness of breath. Gastrointestinal: Negative for abdominal pain, vomiting and diarrhea. Genitourinary: Negative for dysuria. Musculoskeletal: Negative for back pain. Skin: Negative for rash. Neurological: Negative for headache. 10 point Review of Systems otherwise negative ____________________________________________   PHYSICAL EXAM:  VITAL SIGNS: ED Triage Vitals  Enc Vitals Group     BP 03/13/16 0742 149/101 mmHg     Pulse Rate 03/13/16 0742 75     Resp 03/13/16 0742 18     Temp 03/13/16 0742 97.9 F (36.6 C)     Temp Source 03/13/16 0742 Oral     SpO2 03/13/16 0742 98 %     Weight 03/13/16 0742 172 lb (78.019 kg)     Height 03/13/16 0742 5\' 2"  (1.575 m)     Head Cir  --      Peak Flow --      Pain Score 03/13/16 0743 10     Pain Loc --      Pain Edu? --      Excl. in GC? --      Constitutional: Alert and oriented. Tearful and anxious and crying in pain. HEENT   Head: Normocephalic and atraumatic.  Eyes: Conjunctivae are normal. PERRL. Normal extraocular movements.      Ears:         Nose: No congestion/rhinnorhea.   Mouth/Throat: Mucous membranes are moist.   Neck: No stridor. Cardiovascular/Chest: Normal rate, regular rhythm.  No murmurs, rubs, or gallops. Respiratory: Normal respiratory effort without tachypnea nor retractions. Breath sounds are clear and equal bilaterally. No wheezes/rales/rhonchi. Gastrointestinal: Soft. No distention, no guarding, no rebound. Moderate epigastric and right upper quadrant and left upper quadrant tenderness to palpation. No lower abdominal tenderness to palpation. Genitourinary/rectal:Deferred Musculoskeletal: Nontender with normal range of motion in all extremities. No joint effusions.  No lower extremity tenderness.  No edema. Neurologic:  Normal speech and language. No gross or focal neurologic deficits are appreciated. Skin:  Skin is warm, dry and intact. No rash noted. Psychiatric: Mood and affect are normal. Speech and behavior are normal. Patient exhibits appropriate insight and judgment.  ____________________________________________   EKG I, Governor Rooks, MD, the attending physician have personally viewed and interpreted all ECGs.  81 bpm. normal sinus rhythm. Narrow QRS. Normal axis. Normal ST and T-wave ____________________________________________  LABS (pertinent positives/negatives)    Urine pregnancy test negative Lipase 22 Comprehensive metabolic panel without significant abnormality CBC within normal limits. White blood count 6.2, hemoglobin 13.1 and platelet count 317 Urinalysis 2+ leukocytes, 6-30 red blood cells, too numerous to count white blood cells, rare bacteria and  too numerous to count squamous epithelial cells. Negative for nitrites  ____________________________________________  RADIOLOGY All Xrays were viewed by me. Imaging interpreted by Radiologist.  Abdomen acute with chest: Negative abdominal radius. No acute cardiopulmonary disease. __________________________________________  PROCEDURES  Procedure(s) performed: None  Critical Care performed: None  ____________________________________________   ED COURSE / ASSESSMENT AND PLAN  Pertinent labs & imaging results that were available during my care of the patient were reviewed by me and considered in my medical decision making (see chart for details).  Patient patient's tearfulness and epigastric/right upper quadrant tenderness on exam I did place an order for ultrasound right upper quadrant, however upon reading her chart history including 2 visits in April, she does apparently have a long history of chronic epigastric pain which when I asked her again she did say is the same as prior episodes. Her laboratory studies are reassuring. I did go ahead and cancel the ultrasound.  No evidence acute renal failure. No elevated lipase or LFTs. No elevated white blood count. Hemoglobin 13.1 and no history of symptoms concerning for GI bleeding.  The patient is very very anxious and upset, crying harder when I told her that her laboratory studies are reassuring.  Her urinalysis looks dirty and she is not having urinary symptoms. I will send a culture.  Nan from care management did go ahead and talk with the patient and spend an extensive amount of time speaking with the representative from her insurance company, as well as the patient in order to determine what the best course of action might be to help this patient.  This patient does seem to be noncompliant, but I don't know if she is really capable of being compliant. I am quite concerned that she is unable to navigate this complex system  requiring multiple physician visits in order to obtain referral to specialists. I certainly think that psychiatric illness and anxiety is contributing heavily to her chronic pain and nausea, right she is also having symptoms of gastritis which could actually be underlying ulcer. She does need to be seen again by gastroenterology, but  we had trouble reaching anyone to create the preauthorization in order to make her an appointment as an outpatient.  At this point she is received multiple doses of pain and nausea medication is still symptomatic. I will admit her under observation for likely GI, psychiatry, social work, and home health evaluation. Hopefully this can help coordinate her care rather then lead to continued repeated emergency department visits.    CONSULTATIONS:  Dr. Desiree Hane, hospitalist for admission.   Patient / Family / Caregiver informed of clinical course, medical decision-making process, and agree with plan.   I discussed return precautions, follow-up instructions, and discharged instructions with patient and/or family.   ___________________________________________   FINAL CLINICAL IMPRESSION(S) / ED DIAGNOSES   Final diagnoses:  Epigastric pain  Intractable vomiting without nausea, vomiting of unspecified type  Anxiety  Gastritis              Note: This dictation was prepared with Dragon dictation. Any transcriptional errors that result from this process are unintentional   Governor Rooks, MD 03/13/16 1254

## 2016-03-13 NOTE — H&P (Signed)
[facility] - [place] [facility]   PATIENT NAME: Laurie Frederick    MR#:  [id number]  DATE OF BIRTH:  [birth year]  DATE OF ADMISSION:  03/13/2016  PRIMARY CARE PHYSICIAN: Domenic SchwabLindley,Cheryl Paulette, FNP   REQUESTING/REFERRING PHYSICIAN: Governor Rooksebecca Lord  CHIEF COMPLAINT:   Chief Complaint  Patient presents with  . Abdominal Pain  . Emesis    HISTORY OF PRESENT ILLNESS: Laurie Frederick  is a 40 y.o. female with a known history of Sleep apnea, gastric reflux disease, hyperlipidemia, depression, anxiety, posttraumatic stress disorder, obesity, gastric ulcer, H. Pylori- says that for last few weeks she has epigastric pain on and off. She is trying to eat mainly liquid or soft diet only to keep herself from vomiting. Still on Easter Sunday having the dinner with her family and yesterday on Mother's Day having a big dinner with her family she vomited. Last night after vomiting out her dinner she continued to vomit throughout the night and the pain got worse in her epigastric region so she decided to come to emergency room today.  She has not been very regular on taking her sucralfate and Protonix since she was diagnosed with peptic ulcer disease a few months ago. Also found to have UTI in emergency room today, and given to hospitalist team for admission for further management.  PAST MEDICAL HISTORY:   Past Medical History  Diagnosis Date  . Sleep apnea   . GERD (gastroesophageal reflux disease)   . Hyperlipidemia   . Depression   . Anxiety   . PTSD (post-traumatic stress disorder)   . Obesity   . Anemia   . Vitamin D deficiency   . Nontoxic uninodular goiter   . Multiple gastric ulcers   . H pylori ulcer     PAST SURGICAL HISTORY: Past Surgical History  Procedure Laterality Date  . Cesarean section    . Esophagogastroduodenoscopy (egd) with propofol Left 07/07/2015    Procedure: ESOPHAGOGASTRODUODENOSCOPY (EGD) WITH PROPOFOL;  Surgeon: Scot Junobert T Elliott, MD;  [facility];  Service: Endoscopy;  Laterality: Left;  . Cesarean section    . Esophagogastroduodenoscopy (egd) with propofol N/A 09/05/2015    Procedure: ESOPHAGOGASTRODUODENOSCOPY (EGD) WITH PROPOFOL;  Surgeon: Midge Miniumarren Wohl, MD;  Location: [facility] ENDOSCOPY;  Service: Endoscopy;  Laterality: N/A;  . Dilation and curettage of uterus      x 2 TAB    SOCIAL HISTORY:  Social History  Substance Use Topics  . Smoking status: Current Some Day Smoker -- 1.00 packs/day for 17 years    Types: Cigarettes  . Smokeless tobacco: Never Used  . Alcohol Use: No     Comment: occasional    FAMILY HISTORY:  Family History  Problem Relation Age of Onset  . Lung cancer Father   . Cancer Father     lung cancer  . Alcohol abuse Father   . Lung cancer Paternal Aunt   . Cancer Paternal Aunt     lung  . Bone cancer Paternal Uncle   . Cancer Paternal Uncle     pancreatic  . Depression Mother   . Anxiety disorder Mother   . Arthritis Mother   . Hyperlipidemia Mother   . Hypertension Maternal Grandmother   . Diabetes Paternal Grandmother   . Heart disease Paternal Grandmother   . Hypertension Paternal Grandmother   . Asthma Son   . Vision loss Neg Hx   . COPD Neg Hx     DRUG ALLERGIES: No Known Allergies  REVIEW OF  SYSTEMS:   CONSTITUTIONAL: No fever, fatigue or weakness.  EYES: No blurred or double vision.  EARS, NOSE, AND THROAT: No tinnitus or ear pain.  RESPIRATORY: No cough, shortness of breath, wheezing or hemoptysis.  CARDIOVASCULAR: No chest pain, orthopnea, edema.  GASTROINTESTINAL: Positive for nausea, vomiting, no diarrhea , epigastric abdominal pain.  GENITOURINARY: No dysuria, hematuria.  ENDOCRINE: No polyuria, nocturia,  HEMATOLOGY: No anemia, easy bruising or bleeding SKIN: No rash or lesion. MUSCULOSKELETAL: No joint pain or arthritis.   NEUROLOGIC: No tingling, numbness, weakness.  PSYCHIATRY: No anxiety or depression.   MEDICATIONS AT HOME:  Prior to Admission  medications   Medication Sig Start Date End Date Taking? Authorizing Provider  alprazolam Prudy Feeler) 2 MG tablet Take 2 mg by mouth 2 (two) times daily as needed for anxiety.     Historical Provider, MD  citalopram (CELEXA) 40 MG tablet Take 40 mg by mouth at bedtime.    Historical Provider, MD  pantoprazole (PROTONIX) 40 MG tablet Take 1 tablet (40 mg total) by mouth 2 (two) times daily before a meal. 09/19/15   Srikar Sudini, MD  potassium chloride SA (KLOR-CON M20) 20 MEQ tablet Take 1 tablet (20 mEq total) by mouth daily. 02/21/16   Loleta Rose, MD  promethazine (PHENERGAN) 25 MG suppository Place 1 suppository (25 mg total) rectally every 6 (six) hours as needed for nausea. 02/23/16 02/22/17  Anne-Caroline Sharma Covert, MD  promethazine (PHENERGAN) 25 MG tablet Take 1 tablet (25 mg total) by mouth every 6 (six) hours as needed for nausea or vomiting. 02/23/16   Rockne Menghini, MD  QUEtiapine (SEROQUEL) 50 MG tablet Take 100 mg by mouth at bedtime.    Historical Provider, MD  sucralfate (CARAFATE) 1 g tablet Take 1 tablet (1 g total) by mouth 4 (four) times daily as needed (for abdominal discomfort, nausea, and/or vomiting). 02/21/16   Loleta Rose, MD  zolpidem (AMBIEN) 10 MG tablet Take 10 mg by mouth at bedtime.    Historical Provider, MD      PHYSICAL EXAMINATION:   VITAL SIGNS: Blood pressure 129/87, pulse 67, temperature 97.9 F (36.6 C), temperature source Oral, resp. rate 18, height 5\' 2"  (1.575 m), weight 78.019 kg (172 lb), last menstrual period 02/21/2016, SpO2 100 %.  GENERAL:  40 y.o.-year-old patient lying in the bed with Crying due to pain. EYES: Pupils equal, round, reactive to light and accommodation. No scleral icterus. Extraocular muscles intact.  HEENT: Head atraumatic, normocephalic. Oropharynx and nasopharynx clear.  NECK:  Supple, no jugular venous distention. No thyroid enlargement, no tenderness.  LUNGS: Normal breath sounds bilaterally, no wheezing, rales,rhonchi or  crepitation. No use of accessory muscles of respiration.  CARDIOVASCULAR: S1, S2 normal. No murmurs, rubs, or gallops.  ABDOMEN: Soft, nontender, nondistended. Bowel sounds present. No organomegaly or mass. While I was talking to patient I palpated her epigastric area and right upper quadrant and there was no signs of any pain on her face. EXTREMITIES: No pedal edema, cyanosis, or clubbing.  NEUROLOGIC: Cranial nerves II through XII are intact. Muscle strength 5/5 in all extremities. Sensation intact. Gait not checked.  PSYCHIATRIC: The patient is alert and oriented x 3.  SKIN: No obvious rash, lesion, or ulcer.   LABORATORY PANEL:   CBC  Recent Labs Lab 03/13/16 0755  WBC 6.2  HGB 13.1  HCT 39.9  PLT 317  MCV 85.3  MCH 28.1  MCHC 32.9  RDW 16.8*   ------------------------------------------------------------------------------------------------------------------  Chemistries   Recent Labs Lab 03/13/16 0755  NA 140  K 3.8  CL 105  CO2 27  GLUCOSE 101*  BUN 6  CREATININE 0.75  CALCIUM 9.5  AST 19  ALT 16  ALKPHOS 70  BILITOT 0.4   ------------------------------------------------------------------------------------------------------------------ estimated creatinine clearance is 92.3 mL/min (by C-G formula based on Cr of 0.75). ------------------------------------------------------------------------------------------------------------------ No results for input(s): TSH, T4TOTAL, T3FREE, THYROIDAB in the last 72 hours.  Invalid input(s): FREET3   Coagulation profile No results for input(s): INR, PROTIME in the last 168 hours. ------------------------------------------------------------------------------------------------------------------- No results for input(s): DDIMER in the last 72 hours. -------------------------------------------------------------------------------------------------------------------  Cardiac Enzymes  Recent Labs Lab 03/13/16 0755   TROPONINI <0.03   ------------------------------------------------------------------------------------------------------------------ Invalid input(s): POCBNP  ---------------------------------------------------------------------------------------------------------------  Urinalysis    Component Value Date/Time   COLORURINE YELLOW* 03/13/2016 0755   COLORURINE Yellow 11/03/2014 0936   APPEARANCEUR CLOUDY* 03/13/2016 0755   APPEARANCEUR Hazy 11/03/2014 0936   LABSPEC 1.024 03/13/2016 0755   LABSPEC 1.026 11/03/2014 0936   PHURINE 6.0 03/13/2016 0755   PHURINE 5.0 11/03/2014 0936   GLUCOSEU NEGATIVE 03/13/2016 0755   GLUCOSEU Negative 11/03/2014 0936   HGBUR 1+* 03/13/2016 0755   HGBUR 3+ 11/03/2014 0936   BILIRUBINUR NEGATIVE 03/13/2016 0755   BILIRUBINUR Negative 11/03/2014 0936   KETONESUR NEGATIVE 03/13/2016 0755   KETONESUR Negative 11/03/2014 0936   PROTEINUR 100* 03/13/2016 0755   PROTEINUR Negative 11/03/2014 0936   NITRITE NEGATIVE 03/13/2016 0755   NITRITE Negative 11/03/2014 0936   LEUKOCYTESUR 2+* 03/13/2016 0755   LEUKOCYTESUR Trace 11/03/2014 0936     RADIOLOGY: Dg Abd Acute W/chest  03/13/2016  CLINICAL DATA:  Epigastric abdominal pain. History of gastric ulcer. EXAM: DG ABDOMEN ACUTE W/ 1V CHEST COMPARISON:  CT of the abdomen pelvis dated 09/19/2015 FINDINGS: There is no evidence of dilated bowel loops or free intraperitoneal air. No radiopaque calculi or other significant radiographic abnormality is seen. Heart size and mediastinal contours are within normal limits. Both lungs are clear. IUD is noted. IMPRESSION: Negative abdominal radiographs.  No acute cardiopulmonary disease. Electronically Signed   By: Ted Mcalpine M.D.   On: 03/13/2016 12:07    EKG: Orders placed or performed during the hospital encounter of 03/13/16  . EKG 12-Lead  . EKG 12-Lead    IMPRESSION AND PLAN:  * UTI  We'll give IV ceftriaxone and check urine culture.  *  Intractable nausea and vomiting  Possible gastritis  We'll give sucralfate and Protonix, and get GI consult for further management.  Get Zofran and morphine for supportive care.  * Depression  Continue Celexa.  * Smoking  Counseled to quit smoking for 4 minutes and offered nicotine patch.  All the records are reviewed and case discussed with ED provider. Management plans discussed with the patient, family and they are in agreement.  CODE STATUS: Full  Code Status History    Date Active Date Inactive Code Status Order ID Comments User Context   09/19/2015  2:28 AM 09/19/2015  9:48 PM Full Code [id number]  Arnaldo Natal, MD Inpatient   09/03/2015  8:36 PM 09/05/2015  7:33 PM Full Code [id number]  Wyatt Haste, MD ED   07/22/2015  2:45 AM 07/26/2015  3:18 PM Full Code [id number]  Wyatt Haste, MD ED   07/13/2015  7:31 PM 07/16/2015  2:34 PM Full Code [id number]  Adrian Saran, MD Inpatient   07/05/2015 12:03 PM 07/09/2015  2:31 PM Full Code [id number]  Altamese Dilling, MD Inpatient   04/01/2015 12:35 AM 04/01/2015  6:01 PM Full Code [id number]  Severiano Gilbert, RN ED       TOTAL TIME TAKING CARE OF THIS PATIENT: 50 minutes.    Altamese Dilling M.D on 03/13/2016   Between 7am to 6pm - Pager - [phone]  After 6pm go to [email or web address] - password [email or web address]  [facility]  Office  [phone]  CC: Primary care physician; Domenic Schwab, FNP   Note: This dictation was prepared with Dragon dictation along with smaller phrase technology. Any transcriptional errors that result from this process are unintentional.

## 2016-03-14 DIAGNOSIS — R1013 Epigastric pain: Secondary | ICD-10-CM | POA: Diagnosis not present

## 2016-03-14 DIAGNOSIS — R1110 Vomiting, unspecified: Secondary | ICD-10-CM | POA: Insufficient documentation

## 2016-03-14 DIAGNOSIS — R1111 Vomiting without nausea: Secondary | ICD-10-CM | POA: Diagnosis not present

## 2016-03-14 LAB — CBC
HCT: 34 % — ABNORMAL LOW (ref 35.0–47.0)
HEMOGLOBIN: 11.4 g/dL — AB (ref 12.0–16.0)
MCH: 27.9 pg (ref 26.0–34.0)
MCHC: 33.7 g/dL (ref 32.0–36.0)
MCV: 82.8 fL (ref 80.0–100.0)
Platelets: 286 10*3/uL (ref 150–440)
RBC: 4.1 MIL/uL (ref 3.80–5.20)
RDW: 16.7 % — ABNORMAL HIGH (ref 11.5–14.5)
WBC: 5.3 10*3/uL (ref 3.6–11.0)

## 2016-03-14 LAB — BASIC METABOLIC PANEL
ANION GAP: 9 (ref 5–15)
BUN: 8 mg/dL (ref 6–20)
CALCIUM: 8.8 mg/dL — AB (ref 8.9–10.3)
CO2: 27 mmol/L (ref 22–32)
Chloride: 104 mmol/L (ref 101–111)
Creatinine, Ser: 0.64 mg/dL (ref 0.44–1.00)
GFR calc non Af Amer: >60 mL/min (ref >60)
Glucose, Bld: 97 mg/dL (ref 65–99)
Potassium: 2.9 mmol/L — CL (ref 3.5–5.1)
SODIUM: 140 mmol/L (ref 135–145)

## 2016-03-14 LAB — POTASSIUM: POTASSIUM: 3.2 mmol/L — AB (ref 3.5–5.1)

## 2016-03-14 LAB — MAGNESIUM: MAGNESIUM: 2.2 mg/dL (ref 1.7–2.4)

## 2016-03-14 MED ORDER — ENOXAPARIN SODIUM 40 MG/0.4ML ~~LOC~~ SOLN
40.0000 mg | SUBCUTANEOUS | Status: DC
  Administered 2016-03-14 – 2016-03-15 (×2): 40 mg via SUBCUTANEOUS
  Filled 2016-03-14 (×2): qty 0.4

## 2016-03-14 MED ORDER — BOOST / RESOURCE BREEZE PO LIQD
1.0000 | Freq: Three times a day (TID) | ORAL | Status: DC
  Administered 2016-03-14 (×2): 1 via ORAL

## 2016-03-14 MED ORDER — POTASSIUM CHLORIDE CRYS ER 20 MEQ PO TBCR
40.0000 meq | EXTENDED_RELEASE_TABLET | Freq: Once | ORAL | Status: AC
  Administered 2016-03-14: 40 meq via ORAL

## 2016-03-14 MED ORDER — POTASSIUM CHLORIDE CRYS ER 20 MEQ PO TBCR
40.0000 meq | EXTENDED_RELEASE_TABLET | Freq: Once | ORAL | Status: DC
Start: 2016-03-14 — End: 2016-03-15
  Filled 2016-03-14: qty 2

## 2016-03-14 MED ORDER — HYDROMORPHONE HCL 1 MG/ML IJ SOLN
1.0000 mg | INTRAMUSCULAR | Status: DC | PRN
  Administered 2016-03-14 – 2016-03-15 (×6): 1 mg via INTRAVENOUS
  Filled 2016-03-14 (×6): qty 1

## 2016-03-14 MED ORDER — POTASSIUM CHLORIDE 10 MEQ/100ML IV SOLN
10.0000 meq | INTRAVENOUS | Status: AC
  Administered 2016-03-14 (×2): 10 meq via INTRAVENOUS
  Filled 2016-03-14 (×2): qty 100

## 2016-03-14 MED ORDER — PROCHLORPERAZINE EDISYLATE 5 MG/ML IJ SOLN
10.0000 mg | INTRAMUSCULAR | Status: DC | PRN
  Filled 2016-03-14: qty 2

## 2016-03-14 MED ORDER — POTASSIUM CHLORIDE 10 MEQ/100ML IV SOLN
10.0000 meq | INTRAVENOUS | Status: DC
  Filled 2016-03-14 (×5): qty 100

## 2016-03-14 NOTE — Consult Note (Signed)
Patient seen by me in past and requests different doctor for consultations.  Will sign off.

## 2016-03-14 NOTE — Progress Notes (Signed)
PHARMACY CONSULT NOTE - INITIAL  Pharmacy Consult for Electrolytes Indication: hypokalemia  No Known Allergies  Patient Measurements: Height: 5\' 2"  (157.5 cm) Weight: 172 lb (78.019 kg) IBW/kg (Calculated) : 50.1 Adjusted Body Weight:   Vital Signs: Temp: 97.4 F (36.3 C) (05/16 1322) Temp Source: Oral (05/16 1322) BP: 110/59 mmHg (05/16 1322) Pulse Rate: 62 (05/16 1322) Intake/Output from previous day: 05/15 0701 - 05/16 0700 In: 410 [P.O.:360; IV Piggyback:50] Out: -  Intake/Output from this shift: Total I/O In: 720 [P.O.:720] Out: -   Labs:  Recent Labs  03/13/16 0755 03/14/16 0516  WBC 6.2 5.3  HGB 13.1 11.4*  HCT 39.9 34.0*  PLT 317 286    Recent Labs  03/13/16 0755 03/14/16 0516 03/14/16 1550  NA 140 140  --   K 3.8 2.9* 3.2*  CL 105 104  --   CO2 27 27  --   GLUCOSE 101* 97  --   BUN 6 8  --   CREATININE 0.75 0.64  --   CALCIUM 9.5 8.8*  --   MG  --  2.2  --   PROT 8.3*  --   --   ALBUMIN 4.7  --   --   AST 19  --   --   ALT 16  --   --   ALKPHOS 70  --   --   BILITOT 0.4  --   --    Estimated Creatinine Clearance: 92.3 mL/min (by C-G formula based on Cr of 0.64).   No results for input(s): GLUCAP in the last 72 hours.  Medical History: Past Medical History  Diagnosis Date  . Sleep apnea   . GERD (gastroesophageal reflux disease)   . Hyperlipidemia   . Depression   . Anxiety   . PTSD (post-traumatic stress disorder)   . Obesity   . Anemia   . Vitamin D deficiency   . Nontoxic uninodular goiter   . Multiple gastric ulcers   . H pylori ulcer     Medications:  Scheduled:  . cefTRIAXone (ROCEPHIN)  IV  1 g Intravenous Q24H  . citalopram  40 mg Oral QHS  . enoxaparin (LOVENOX) injection  40 mg Subcutaneous Q24H  . feeding supplement  1 Container Oral TID BM  . ondansetron (ZOFRAN) IV  4 mg Intravenous Once  . pantoprazole  40 mg Oral BID AC  . potassium chloride  10 mEq Intravenous Q1 Hr x 4  . potassium chloride  40 mEq  Oral Once  . QUEtiapine  100 mg Oral QHS  . zolpidem  5 mg Oral QHS   Infusions:    Assessment: 40 yo female with Potassium= 2.9. Patient with Intractable nausea and vomiting.  Plan:  Per MD, KCL 40meq po x 1 and KCL 10meq IV x 2 have been ordered.  Magnesium level=2.2. Will recheck Potassium level at 1600. F/u labs in am.  5/16:  K @ 16:00 = 3.2  Will order KCl 10 mEq IV X 4 and recheck K on 5/17 @ 00:00.   Debbrah Sampedro D 03/14/2016,6:09 PM

## 2016-03-14 NOTE — Consult Note (Signed)
[facility]  [address] [place], [place] [address] Phone: [phone] Fax : [phone]  Consultation  Referring Provider:     No ref. provider found Primary Care Physician:  Domenic SchwabLindley,Cheryl Paulette, FNP Primary Gastroenterologist:  Dr. Mechele CollinElliott         Reason for Consultation:     Nausea vomiting with epigastric pain  Date of Admission:  03/13/2016 Date of Consultation:  03/14/2016         HPI:   Laurie Frederick is a 40 y.o. female who was admitted with Nausea vomiting and epigastric pain. The patient was seen by me back in November of last year and states that her upper endoscopy showed gastritis and ulcers. The review of the chart show the patient upper endoscopy to be normal without any gastritis or inflammation. She had a previous upper endoscopy by Dr. Mechele CollinElliott which did show some gastritis but no ulcerations. She now reports that she feels like food is not going down. She also states that she has episodes of nausea and vomiting. She states these episodes always happen on holidays when she over eats such as [place] and Mother's Day. She states that she think she has an ulcer and she needs another upper endoscopy. The patient also reports that she had not been taking her Protonix on a regular basis but was only taking it as needed.  Past Medical History  Diagnosis Date  . Sleep apnea   . GERD (gastroesophageal reflux disease)   . Hyperlipidemia   . Depression   . Anxiety   . PTSD (post-traumatic stress disorder)   . Obesity   . Anemia   . Vitamin D deficiency   . Nontoxic uninodular goiter   . Multiple gastric ulcers   . H pylori ulcer     Past Surgical History  Procedure Laterality Date  . Cesarean section    . Esophagogastroduodenoscopy (egd) with propofol Left 07/07/2015    Procedure: ESOPHAGOGASTRODUODENOSCOPY (EGD) WITH PROPOFOL;  Surgeon: Scot Junobert T Elliott, MD;  [facility];  Service: Endoscopy;  Laterality: Left;  . Cesarean  section    . Esophagogastroduodenoscopy (egd) with propofol N/A 09/05/2015    Procedure: ESOPHAGOGASTRODUODENOSCOPY (EGD) WITH PROPOFOL;  Surgeon: Midge Miniumarren Malaney Mcbean, MD;  Location: [facility] ENDOSCOPY;  Service: Endoscopy;  Laterality: N/A;  . Dilation and curettage of uterus      x 2 TAB    Prior to Admission medications   Medication Sig Start Date End Date Taking? Authorizing Provider  alprazolam Prudy Feeler(XANAX) 2 MG tablet Take 2 mg by mouth 2 (two) times daily as needed for anxiety.    Yes Historical Provider, MD  citalopram (CELEXA) 40 MG tablet Take 40 mg by mouth at bedtime.   Yes Historical Provider, MD  pantoprazole (PROTONIX) 40 MG tablet Take 1 tablet (40 mg total) by mouth 2 (two) times daily before a meal. 09/19/15  Yes Srikar Sudini, MD  potassium chloride SA (KLOR-CON M20) 20 MEQ tablet Take 1 tablet (20 mEq total) by mouth daily. 02/21/16  Yes Loleta Roseory Forbach, MD  promethazine (PHENERGAN) 25 MG suppository Place 1 suppository (25 mg total) rectally every 6 (six) hours as needed for nausea. 02/23/16 02/22/17 Yes Anne-Caroline Sharma CovertNorman, MD  promethazine (PHENERGAN) 25 MG tablet Take 1 tablet (25 mg total) by mouth every 6 (six) hours as needed for nausea or vomiting. 02/23/16  Yes Anne-Caroline Sharma CovertNorman, MD  QUEtiapine (SEROQUEL) 50 MG tablet Take 100 mg by mouth at bedtime.   Yes Historical Provider, MD  sucralfate (  CARAFATE) 1 g tablet Take 1 tablet (1 g total) by mouth 4 (four) times daily as needed (for abdominal discomfort, nausea, and/or vomiting). 02/21/16  Yes Loleta Rose, MD  zolpidem (AMBIEN) 10 MG tablet Take 10 mg by mouth at bedtime.   Yes Historical Provider, MD    Family History  Problem Relation Age of Onset  . Lung cancer Father   . Cancer Father     lung cancer  . Alcohol abuse Father   . Lung cancer Paternal Aunt   . Cancer Paternal Aunt     lung  . Bone cancer Paternal Uncle   . Cancer Paternal Uncle     pancreatic  . Depression Mother   . Anxiety disorder Mother   . Arthritis  Mother   . Hyperlipidemia Mother   . Hypertension Maternal Grandmother   . Diabetes Paternal Grandmother   . Heart disease Paternal Grandmother   . Hypertension Paternal Grandmother   . Asthma Son   . Vision loss Neg Hx   . COPD Neg Hx      Social History  Substance Use Topics  . Smoking status: Current Some Day Smoker -- 1.00 packs/day for 17 years    Types: Cigarettes  . Smokeless tobacco: Never Used  . Alcohol Use: No     Comment: occasional    Allergies as of 03/13/2016  . (No Known Allergies)    Review of Systems:    All systems reviewed and negative except where noted in HPI.   Physical Exam:  Vital signs in last 24 hours: Temp:  [97.4 F (36.3 C)-99.1 F (37.3 C)] 97.4 F (36.3 C) (05/16 1322) Pulse Rate:  [58-82] 62 (05/16 1322) Resp:  [18] 18 (05/16 1322) BP: (109-141)/(59-71) 110/59 mmHg (05/16 1322) SpO2:  [98 %-100 %] 99 % (05/16 1322) Last BM Date:  (last bm unknown) General:   Pleasant, cooperative in NAD Head:  Normocephalic and atraumatic. Eyes:   No icterus.   Conjunctiva pink. PERRLA. Ears:  Normal auditory acuity. Neck:  Supple; no masses or thyroidomegaly Lungs: Respirations even and unlabored. Lungs clear to auscultation bilaterally.   No wheezes, crackles, or rhonchi.  Heart:  Regular rate and rhythm;  Without murmur, clicks, rubs or gallops Abdomen:  Soft, nondistended, nontender. Normal bowel sounds. No appreciable masses or hepatomegaly.  No rebound or guarding.  Rectal:  Not performed. Msk:  Symmetrical without gross deformities.   Extremities:  Without edema, cyanosis or clubbing. Neurologic:  Alert and oriented x3;  grossly normal neurologically. Skin:  Intact without significant lesions or rashes. Cervical Nodes:  No significant cervical adenopathy. Psych:  Alert and cooperative. Normal affect.  LAB RESULTS:  Recent Labs  03/13/16 0755 03/14/16 0516  WBC 6.2 5.3  HGB 13.1 11.4*  HCT 39.9 34.0*  PLT 317 286   BMET  Recent  Labs  03/13/16 0755 03/14/16 0516 03/14/16 1550  NA 140 140  --   K 3.8 2.9* 3.2*  CL 105 104  --   CO2 27 27  --   GLUCOSE 101* 97  --   BUN 6 8  --   CREATININE 0.75 0.64  --   CALCIUM 9.5 8.8*  --    LFT  Recent Labs  03/13/16 0755  PROT 8.3*  ALBUMIN 4.7  AST 19  ALT 16  ALKPHOS 70  BILITOT 0.4   PT/INR No results for input(s): LABPROT, INR in the last 72 hours.  STUDIES: Dg Abd Acute W/chest  03/13/2016  CLINICAL  DATA:  Epigastric abdominal pain. History of gastric ulcer. EXAM: DG ABDOMEN ACUTE W/ 1V CHEST COMPARISON:  CT of the abdomen pelvis dated 09/19/2015 FINDINGS: There is no evidence of dilated bowel loops or free intraperitoneal air. No radiopaque calculi or other significant radiographic abnormality is seen. Heart size and mediastinal contours are within normal limits. Both lungs are clear. IUD is noted. IMPRESSION: Negative abdominal radiographs.  No acute cardiopulmonary disease. Electronically Signed   By: Ted Mcalpine M.D.   On: 03/13/2016 12:07      Impression / Plan:   Laurie Frederick is a 40 y.o. y/o female with who has a history of nausea and vomiting that she reports to be when she over eats. She reports her symptoms happen both on Mother's Day and Easter. The patient is convinced that she has an ulcer or some obstruction to her esophagus. These are similar to the symptoms she had when I saw her back in November with a upper endoscopy showing a normal esophagus stomach and small bowel. The patient has been explained that her symptoms may be related to other things such as gallbladder disease or her over eating when she has the symptoms. The patient will be set up for an upper endoscopy for tomorrow to rule out any peptic ulcer disease. The patient has been explained the plan and agrees that if the upper endoscopy is normal that another cause of her symptoms should be sought.  Thank you for involving me in the care of this patient.      LOS:  1 day   Midge Minium, MD  03/14/2016, 8:11 PM   Note: This dictation was prepared with Dragon dictation along with smaller phrase technology. Any transcriptional errors that result from this process are unintentional.

## 2016-03-14 NOTE — Progress Notes (Signed)
   03/14/16 0900  Clinical Encounter Type  Visited With Patient  Visit Type Follow-up  Referral From Nurse  Consult/Referral To Chaplain  Spiritual Encounters  Spiritual Needs Prayer  Stress Factors  Patient Stress Factors Health changes  Visited patient to follow up on prayer request. Patient appeared fatigued & expressed abdominal pain. She requests prayer for discovery of the source of her illness and for recovery. We prayed and she requested a follow up visit for the afternoon. Chap. Breyah Akhter G. Naelani Lafrance, ext. [phone]

## 2016-03-14 NOTE — Progress Notes (Signed)
Patient with a critical lab results of Potassium at 2.9.  Paged and spoke with Dr. Tobi BastosPyreddy who ordered a one time PO dose of K-Dur 40 mg and (2) 1 hour IV 10 meq/13600mL potassium chloride bags.

## 2016-03-14 NOTE — Progress Notes (Addendum)
PHARMACY CONSULT NOTE - INITIAL  Pharmacy Consult for Electrolytes Indication: hypokalemia  No Known Allergies  Patient Measurements: Height: 5\' 2"  (157.5 cm) Weight: 172 lb (78.019 kg) IBW/kg (Calculated) : 50.1 Adjusted Body Weight:   Vital Signs: Temp: 99.1 F (37.3 C) (05/16 0450) Temp Source: Oral (05/16 0450) BP: 141/71 mmHg (05/16 0450) Pulse Rate: 82 (05/16 0450) Intake/Output from previous day: 05/15 0701 - 05/16 0700 In: 410 [P.O.:360; IV Piggyback:50] Out: -  Intake/Output from this shift:    Labs:  Recent Labs  03/13/16 0755 03/14/16 0516  WBC 6.2 5.3  HGB 13.1 11.4*  HCT 39.9 34.0*  PLT 317 286    Recent Labs  03/13/16 0755 03/14/16 0516  NA 140 140  K 3.8 2.9*  CL 105 104  CO2 27 27  GLUCOSE 101* 97  BUN 6 8  CREATININE 0.75 0.64  CALCIUM 9.5 8.8*  PROT 8.3*  --   ALBUMIN 4.7  --   AST 19  --   ALT 16  --   ALKPHOS 70  --   BILITOT 0.4  --    Estimated Creatinine Clearance: 92.3 mL/min (by C-G formula based on Cr of 0.64).   No results for input(s): GLUCAP in the last 72 hours.  Medical History: Past Medical History  Diagnosis Date  . Sleep apnea   . GERD (gastroesophageal reflux disease)   . Hyperlipidemia   . Depression   . Anxiety   . PTSD (post-traumatic stress disorder)   . Obesity   . Anemia   . Vitamin D deficiency   . Nontoxic uninodular goiter   . Multiple gastric ulcers   . H pylori ulcer     Medications:  Scheduled:  . cefTRIAXone (ROCEPHIN)  IV  1 g Intravenous Q24H  . citalopram  40 mg Oral QHS  . heparin  5,000 Units Subcutaneous Q8H  . ondansetron (ZOFRAN) IV  4 mg Intravenous Once  . pantoprazole  40 mg Oral BID AC  . potassium chloride  10 mEq Intravenous Q1 Hr x 2  . potassium chloride  40 mEq Oral Once  . QUEtiapine  100 mg Oral QHS  . zolpidem  5 mg Oral QHS   Infusions:    Assessment: 40 yo female with Potassium= 2.9. Patient with Intractable nausea and vomiting.  Plan:  Per MD, KCL  40meq po x 1 and KCL 10meq IV x 2 have been ordered.  Magnesium level=2.2. Will recheck Potassium level at 1600. F/u labs in am.  Mukesh Kornegay A 03/14/2016,7:32 AM

## 2016-03-14 NOTE — Progress Notes (Signed)
[employer] [facility] - [place] at [facility]   PATIENT NAME: Laurie Frederick    MR#:  [id number]  DATE OF BIRTH:  1976/02/03  SUBJECTIVE:   Complaining of nausea and epigastric abdominal pain. Denies dark colored stools. Does report that she had an episode of hemoptysis. She took NSAIDs a few days ago for abdominal pain.  REVIEW OF SYSTEMS:    Review of Systems  Constitutional: Negative for fever, chills and malaise/fatigue.  HENT: Negative for ear discharge, ear pain, hearing loss, nosebleeds and sore throat.   Eyes: Negative for blurred vision and pain.  Respiratory: Negative for cough, hemoptysis, shortness of breath and wheezing.   Cardiovascular: Negative for chest pain, palpitations and leg swelling.  Gastrointestinal: Positive for nausea and abdominal pain. Negative for vomiting, diarrhea and blood in stool.  Genitourinary: Negative for dysuria.  Musculoskeletal: Negative for back pain.  Neurological: Negative for dizziness, tremors, speech change, focal weakness, seizures and headaches.  Endo/Heme/Allergies: Does not bruise/bleed easily.  Psychiatric/Behavioral: Negative for depression, suicidal ideas and hallucinations.    Tolerating Diet:Clear liquid diet      DRUG ALLERGIES:  No Known Allergies  VITALS:  Blood pressure 141/71, pulse 82, temperature 99.1 F (37.3 C), temperature source Oral, resp. rate 18, height  (1.575 m), weight 78.019 kg (172 lb), last menstrual period 02/21/2016, SpO2 100 %.  PHYSICAL EXAMINATION:   Physical Exam  Constitutional: She is oriented to person, place, and time and well-developed, well-nourished, and in no distress. No distress.  HENT:  Head: Normocephalic.  Eyes: No scleral icterus.  Neck: Normal range of motion. Neck supple. No JVD present. No tracheal deviation present.  Cardiovascular: Normal rate, regular rhythm and normal heart sounds.  Exam reveals no gallop and no friction rub.   No murmur  heard. Pulmonary/Chest: Effort normal and breath sounds normal. No respiratory distress. She has no wheezes. She has no rales. She exhibits no tenderness.  Abdominal: Soft. Bowel sounds are normal. She exhibits no distension and no mass. There is tenderness (epigastric). There is no rebound and no guarding.  Musculoskeletal: Normal range of motion. She exhibits no edema.  Neurological: She is alert and oriented to person, place, and time.  Skin: Skin is warm. No rash noted. No erythema.  Psychiatric: Affect and judgment normal.      LABORATORY PANEL:   CBC  Recent Labs Lab 03/14/16 0516  WBC 5.3  HGB 11.4*  HCT 34.0*  PLT 286   ------------------------------------------------------------------------------------------------------------------  Chemistries   Recent Labs Lab 03/13/16 0755 03/14/16 0516  NA 140 140  K 3.8 2.9*  CL 105 104  CO2 27 27  GLUCOSE 101* 97  BUN 6 8  CREATININE 0.75 0.64  CALCIUM 9.5 8.8*  MG  --  2.2  AST 19  --   ALT 16  --   ALKPHOS 70  --   BILITOT 0.4  --    ------------------------------------------------------------------------------------------------------------------  Cardiac Enzymes  Recent Labs Lab 03/13/16 0755  TROPONINI <0.03   ------------------------------------------------------------------------------------------------------------------  RADIOLOGY:  Dg Abd Acute W/chest  03/13/2016  CLINICAL DATA:  Epigastric abdominal pain. History of gastric ulcer. EXAM: DG ABDOMEN ACUTE W/ 1V CHEST COMPARISON:  CT of the abdomen pelvis dated 09/19/2015 FINDINGS: There is no evidence of dilated bowel loops or free intraperitoneal air. No radiopaque calculi or other significant radiographic abnormality is seen. Heart size and mediastinal contours are within normal limits. Both lungs are clear. IUD is noted. IMPRESSION: Negative abdominal radiographs.  No acute cardiopulmonary disease. Electronically  Signed   By: Ted Mcalpineobrinka  Dimitrova M.D.    On: 03/13/2016 12:07     ASSESSMENT AND PLAN:   40 y.o. female with history of chronic epigastric abdominal pain, cyclical vomiting, prior diagnosis of gastritis and gastroduodenitis, who is here with acute onset of epigastric pain associated with nausea and vomiting  1. Epigastric abdominal pain and nausea and vomiting: Continue Carafate and Protonix Follow up in GI consult. She may benefit from EGD. Continue supportive care with antiemetics.   2. Hypokalemia: Replete and recheck in a.m. Also check magnesium level. 3. Urinary tract infection, acute cystitis without hematuria: Continue Rocephin and follow-up on final urine culture.  4. Depression: Continue Celexa.   Management plans discussed with the patient and she is in agreement.  CODE STATUS: FULL  TOTAL TIME TAKING CARE OF THIS PATIENT: 33 minutes.     POSSIBLE D/C 1-2 days, DEPENDING ON CLINICAL CONDITION.   Lannie Heaps M.D on 03/14/2016 at 10:37 AM  Between 7am to 6pm - Pager - [phone] After 6pm go to [email or web address] - password [email or web address]  [facility]  Office  [phone]  CC: Primary care physician; Domenic SchwabLindley,Cheryl Paulette, FNP  Note: This dictation was prepared with Dragon dictation along with smaller phrase technology. Any transcriptional errors that result from this process are unintentional.

## 2016-03-14 NOTE — Care Management Note (Signed)
Case Management Note  Patient Details  Name: Laurie Frederick MRN: [id number] Date of Birth: 1976/05/21  Subjective/Objective:                   Met with patient to discuss discharge planning due to multiple presentations to ED. She states she does not work and lives with her mother that also does not work. She has two children in the home- one 38 year old and a 38 year old. She admits that she is under a lot of stress and states that she has had gastric ulcers in the past. She states that nurse observed emesis that had blood in it this visit; Hgb ~ 11. Her PCP is with [facility]. She is not home bound. She drives. She states that her Medicaid helps her with medications. She states "there is something wrong and my stomach is killing me- please figure out what is wrong".   Action/Plan: No current RNCM needs.   Expected Discharge Date:  03/16/16               Expected Discharge Plan:     In-House Referral:     Discharge planning Services  CM Consult  Post Acute Care Choice:  Home Health Choice offered to:  Patient  DME Arranged:    DME Agency:     HH Arranged:    [place] Agency:     Status of Service:  Completed, signed off  Medicare Important Message Given:    Date Medicare IM Given:    Medicare IM give by:    Date Additional Medicare IM Given:    Additional Medicare Important Message give by:     If discussed at [facility] of Stay Meetings, dates discussed:    Additional Comments:  Marshell Garfinkel, RN 03/14/2016, 10:31 AM

## 2016-03-14 NOTE — Progress Notes (Signed)
Initial Nutrition Assessment   INTERVENTION:   -Await diet progression as medically able -Will recommend Boost Breeze po TID, each supplement provides 250 kcal and 9 grams of protein   NUTRITION DIAGNOSIS:   Inadequate oral intake related to acute illness as evidenced by per patient/family report.  GOAL:   Patient will meet greater than or equal to 90% of their needs  MONITOR:   PO intake, Supplement acceptance, Diet advancement, Labs, I & O's, Weight trends  REASON FOR ASSESSMENT:   Malnutrition Screening Tool    ASSESSMENT:   Pt is a 40 y.o. female with history of chronic epigastric abdominal pain, cyclical vomiting, prior diagnosis of gastritis and gastroduodenitis, who is here with acute onset of epigastric pain associated with nausea and vomiting per MD note. GI consulted.   Past Medical History  Diagnosis Date  . Sleep apnea   . GERD (gastroesophageal reflux disease)   . Hyperlipidemia   . Depression   . Anxiety   . PTSD (post-traumatic stress disorder)   . Obesity   . Anemia   . Vitamin D deficiency   . Nontoxic uninodular goiter   . Multiple gastric ulcers   . H pylori ulcer     Diet Order:  Diet clear liquid Room service appropriate?: Yes; Fluid consistency:: Thin   Pt reports drinking a some apple juice this am, but feeling very nauseated on visit.   Pt reports eating only jello and mashed potatoes and light food items for the past 2 weeks. Pt reports if eating anything heavier she would throw up. Pt reports drinking Boost Weight Maintenance from [facility] but recently was having trouble finding it. Pt reports GI symptoms of n/v 'all started about 7-8 months ago,' later in conversation. Pt with recent admission with n/v.  Medications: Protonix, KCl Labs: K 2.9  Gastrointestinal Profile: Last BM:  no BM documented, pt c/o n/v   Nutrition-Focused Physical Exam Findings: Nutrition-Focused physical exam completed. Findings are WDL for fat depletion, muscle  depletion, and edema.    Weight Change: Pt reported weight of 210lbs one year ago (18% weight loss in one year) Per [facility] weight trends pt weight stable since November 2016, 6 months ago.   Skin:  Reviewed, no issues   Height:   Ht Readings from Last 1 Encounters:  03/13/16 5\' 2"  (1.575 m)    Weight:   Wt Readings from Last 1 Encounters:  03/13/16 172 lb (78.019 kg)   Wt Readings from Last 10 Encounters:  03/13/16 172 lb (78.019 kg)  02/23/16 172 lb (78.019 kg)  02/21/16 171 lb (77.565 kg)  02/02/16 173 lb (78.472 kg)  11/23/15 171 lb (77.565 kg)  09/24/15 171 lb (77.565 kg)  09/19/15 162 lb (73.483 kg)  09/17/15 171 lb (77.565 kg)  09/16/15 171 lb (77.565 kg)  09/08/15 170 lb (77.111 kg)    Ideal Body Weight:   50kg  BMI:  Body mass index is 31.45 kg/(m^2).  Estimated Nutritional Needs:   Kcal:  1950-2340kcals  Protein:  78-94g protein  Fluid:  >2L fluid  EDUCATION NEEDS:   No education needs identified at this time  Leda QuailAllyson Rachal Dvorsky, RD, LDN Pager [phone] Weekend/On-Call Pager [phone]

## 2016-03-14 NOTE — Plan of Care (Signed)
Problem: Pain Managment: Goal: General experience of comfort will improve Outcome: Progressing Pt. Required pain medication for abd. pain.  Problem: Nutrition: Goal: Adequate nutrition will be maintained Outcome: Progressing Pt has been nauseated but is drinking fluids.

## 2016-03-15 ENCOUNTER — Encounter
Admission: EM | Disposition: A | Discharge status: Home / Self Care | Payer: Self-pay | Source: Home / Self Care | Attending: Internal Medicine

## 2016-03-15 ENCOUNTER — Observation Stay: Payer: Medicaid Other | Admitting: Anesthesiology

## 2016-03-15 ENCOUNTER — Encounter: Payer: Self-pay | Admitting: Anesthesiology

## 2016-03-15 ENCOUNTER — Observation Stay: Payer: Medicaid Other

## 2016-03-15 DIAGNOSIS — F419 Anxiety disorder, unspecified: Secondary | ICD-10-CM

## 2016-03-15 DIAGNOSIS — R1111 Vomiting without nausea: Secondary | ICD-10-CM | POA: Diagnosis not present

## 2016-03-15 DIAGNOSIS — N390 Urinary tract infection, site not specified: Secondary | ICD-10-CM

## 2016-03-15 DIAGNOSIS — R109 Unspecified abdominal pain: Secondary | ICD-10-CM | POA: Insufficient documentation

## 2016-03-15 DIAGNOSIS — K2970 Gastritis, unspecified, without bleeding: Secondary | ICD-10-CM

## 2016-03-15 DIAGNOSIS — R1013 Epigastric pain: Secondary | ICD-10-CM

## 2016-03-15 HISTORY — PX: ESOPHAGOGASTRODUODENOSCOPY (EGD) WITH PROPOFOL: SHX5813

## 2016-03-15 LAB — BASIC METABOLIC PANEL
ANION GAP: 8 (ref 5–15)
BUN: 5 mg/dL — ABNORMAL LOW (ref 6–20)
CALCIUM: 8.8 mg/dL — AB (ref 8.9–10.3)
CO2: 27 mmol/L (ref 22–32)
CREATININE: 0.69 mg/dL (ref 0.44–1.00)
Chloride: 103 mmol/L (ref 101–111)
Glucose, Bld: 88 mg/dL (ref 65–99)
Potassium: 3.3 mmol/L — ABNORMAL LOW (ref 3.5–5.1)
SODIUM: 138 mmol/L (ref 135–145)

## 2016-03-15 LAB — URINE CULTURE

## 2016-03-15 SURGERY — ESOPHAGOGASTRODUODENOSCOPY (EGD) WITH PROPOFOL
Anesthesia: General

## 2016-03-15 MED ORDER — SODIUM CHLORIDE 0.9 % IV SOLN
INTRAVENOUS | Status: DC | PRN
  Administered 2016-03-15: 12:00:00 via INTRAVENOUS

## 2016-03-15 MED ORDER — MIDAZOLAM HCL 2 MG/2ML IJ SOLN
INTRAMUSCULAR | Status: DC | PRN
  Administered 2016-03-15: 1 mg via INTRAVENOUS

## 2016-03-15 MED ORDER — POTASSIUM CHLORIDE CRYS ER 20 MEQ PO TBCR
20.0000 meq | EXTENDED_RELEASE_TABLET | Freq: Once | ORAL | Status: AC
  Administered 2016-03-15: 20 meq via ORAL
  Filled 2016-03-15: qty 1

## 2016-03-15 MED ORDER — BOOST / RESOURCE BREEZE PO LIQD: 1.0000 | Freq: Three times a day (TID) | ORAL | Status: DC

## 2016-03-15 MED ORDER — FENTANYL CITRATE (PF) 100 MCG/2ML IJ SOLN
INTRAMUSCULAR | Status: DC | PRN
  Administered 2016-03-15: 50 ug via INTRAVENOUS

## 2016-03-15 MED ORDER — PROPOFOL 500 MG/50ML IV EMUL
INTRAVENOUS | Status: DC | PRN
  Administered 2016-03-15: 140 ug/kg/min via INTRAVENOUS

## 2016-03-15 MED ORDER — LIDOCAINE HCL (CARDIAC) 20 MG/ML IV SOLN
INTRAVENOUS | Status: DC | PRN
  Administered 2016-03-15: 30 mg via INTRAVENOUS

## 2016-03-15 MED ORDER — SUCRALFATE 1 G PO TABS: 1.0000 g | ORAL_TABLET | Freq: Three times a day (TID) | ORAL | Status: DC

## 2016-03-15 NOTE — Discharge Instructions (Signed)

## 2016-03-15 NOTE — H&P (Signed)
Laurie Frederick is an 40 y.o. female.   Chief Complaint: nausea and vomiting HPI: 40 yr old female with intractable nausea and vomiting that seems to come in cycles.  She has had this for about 7 months and has not been able to find the specific etiology.  She was concerned about being discharged and wanted to see a Psychologist, sport and exercise.  Patient states that she will have nausea and vomiting that comes on mainly at night.  She does sometimes feel acid reflux in her throat in the AM.  When she vomits it is usually clear and then turns to yellowish.  She states that after she has vomited a few times she then gets epigastric pain that is burning and travels up the chest.  She denies any other abdominal pain and no pain prior to beginning of vomiting.  She also denies fevers, chills, constipation or diarrhea.  She does state that she has noticed this more with dairy products as well.    Past Medical History  Diagnosis Date  . Sleep apnea   . GERD (gastroesophageal reflux disease)   . Hyperlipidemia   . Depression   . Anxiety   . PTSD (post-traumatic stress disorder)   . Obesity   . Anemia   . Vitamin D deficiency   . Nontoxic uninodular goiter   . Multiple gastric ulcers   . H pylori ulcer     Past Surgical History  Procedure Laterality Date  . Cesarean section    . Esophagogastroduodenoscopy (egd) with propofol Left 07/07/2015    Procedure: ESOPHAGOGASTRODUODENOSCOPY (EGD) WITH PROPOFOL;  Surgeon: Manya Silvas, MD;  Location: [place];  Service: Endoscopy;  Laterality: Left;  . Cesarean section    . Esophagogastroduodenoscopy (egd) with propofol N/A 09/05/2015    Procedure: ESOPHAGOGASTRODUODENOSCOPY (EGD) WITH PROPOFOL;  Surgeon: Lucilla Lame, MD;  Location: [facility] ENDOSCOPY;  Service: Endoscopy;  Laterality: N/A;  . Dilation and curettage of uterus      x 2 TAB    Family History  Problem Relation Age of Onset  . Lung cancer Father   . Cancer Father     lung cancer  . Alcohol abuse  Father   . Lung cancer Paternal Aunt   . Cancer Paternal Aunt     lung  . Bone cancer Paternal Uncle   . Cancer Paternal Uncle     pancreatic  . Depression Mother   . Anxiety disorder Mother   . Arthritis Mother   . Hyperlipidemia Mother   . Hypertension Maternal Grandmother   . Diabetes Paternal Grandmother   . Heart disease Paternal Grandmother   . Hypertension Paternal Grandmother   . Asthma Son   . Vision loss Neg Hx   . COPD Neg Hx    Social History:  reports that she has been smoking Cigarettes.  She has a 17 pack-year smoking history. She has never used smokeless tobacco. She reports that she does not drink alcohol or use illicit drugs.  Allergies: No Known Allergies  Medications Prior to Admission  Medication Sig Dispense Refill  . alprazolam (XANAX) 2 MG tablet Take 2 mg by mouth 2 (two) times daily as needed for anxiety.     . citalopram (CELEXA) 40 MG tablet Take 40 mg by mouth at bedtime.    . pantoprazole (PROTONIX) 40 MG tablet Take 1 tablet (40 mg total) by mouth 2 (two) times daily before a meal. 60 tablet 0  . potassium chloride SA (KLOR-CON M20) 20 MEQ tablet Take  1 tablet (20 mEq total) by mouth daily. 7 tablet 0  . promethazine (PHENERGAN) 25 MG suppository Place 1 suppository (25 mg total) rectally every 6 (six) hours as needed for nausea. 12 suppository 1  . promethazine (PHENERGAN) 25 MG tablet Take 1 tablet (25 mg total) by mouth every 6 (six) hours as needed for nausea or vomiting. 20 tablet 0  . QUEtiapine (SEROQUEL) 50 MG tablet Take 100 mg by mouth at bedtime.    Marland Kitchen zolpidem (AMBIEN) 10 MG tablet Take 10 mg by mouth at bedtime.    . [DISCONTINUED] sucralfate (CARAFATE) 1 g tablet Take 1 tablet (1 g total) by mouth 4 (four) times daily as needed (for abdominal discomfort, nausea, and/or vomiting). 30 tablet 1    Results for orders placed or performed during the hospital encounter of 03/13/16 (from the past 48 hour(s))  Basic metabolic panel     Status:  Abnormal   Collection Time: 03/14/16  5:16 AM  Result Value Ref Range   Sodium 140 135 - 145 mmol/L   Potassium 2.9 (LL) 3.5 - 5.1 mmol/L    Comment: CRITICAL RESULT CALLED TO, READ BACK BY AND VERIFIED WITH CAROLYN SONBAY ON 03/14/16 AT 0601 BY QSD    Chloride 104 101 - 111 mmol/L   CO2 27 22 - 32 mmol/L   Glucose, Bld 97 65 - 99 mg/dL   BUN 8 6 - 20 mg/dL   Creatinine, Ser 0.64 0.44 - 1.00 mg/dL   Calcium 8.8 (L) 8.9 - 10.3 mg/dL   GFR calc non Af Amer >60 >60 mL/min   GFR calc Af Amer >60 >60 mL/min    Comment: (NOTE) The eGFR has been calculated using the CKD EPI equation. This calculation has not been validated in all clinical situations. eGFR's persistently <60 mL/min signify possible Chronic Kidney Disease.    Anion gap 9 5 - 15  CBC     Status: Abnormal   Collection Time: 03/14/16  5:16 AM  Result Value Ref Range   WBC 5.3 3.6 - 11.0 K/uL   RBC 4.10 3.80 - 5.20 MIL/uL   Hemoglobin 11.4 (L) 12.0 - 16.0 g/dL   HCT 34.0 (L) 35.0 - 47.0 %   MCV 82.8 80.0 - 100.0 fL   MCH 27.9 26.0 - 34.0 pg   MCHC 33.7 32.0 - 36.0 g/dL   RDW 16.7 (H) 11.5 - 14.5 %   Platelets 286 150 - 440 K/uL  Magnesium     Status: None   Collection Time: 03/14/16  5:16 AM  Result Value Ref Range   Magnesium 2.2 1.7 - 2.4 mg/dL  Potassium     Status: Abnormal   Collection Time: 03/14/16  3:50 PM  Result Value Ref Range   Potassium 3.2 (L) 3.5 - 5.1 mmol/L  Basic metabolic panel     Status: Abnormal   Collection Time: 03/15/16 12:24 AM  Result Value Ref Range   Sodium 138 135 - 145 mmol/L   Potassium 3.3 (L) 3.5 - 5.1 mmol/L   Chloride 103 101 - 111 mmol/L   CO2 27 22 - 32 mmol/L   Glucose, Bld 88 65 - 99 mg/dL   BUN 5 (L) 6 - 20 mg/dL   Creatinine, Ser 0.69 0.44 - 1.00 mg/dL   Calcium 8.8 (L) 8.9 - 10.3 mg/dL   GFR calc non Af Amer >60 >60 mL/min   GFR calc Af Amer >60 >60 mL/min    Comment: (NOTE) The eGFR has been calculated using  the CKD EPI equation. This calculation has not been  validated in all clinical situations. eGFR's persistently <60 mL/min signify possible Chronic Kidney Disease.    Anion gap 8 5 - 15   Dg Abd 1 View  03/15/2016  CLINICAL DATA:  Abdominal pain, nausea, vomiting and diarrhea 4 days. EXAM: ABDOMEN - 1 VIEW COMPARISON:  03/13/2016 FINDINGS: Bowel gas pattern is nonobstructive. Air is present throughout the colon. There are a few nondilated air-filled loops of small bowel. Catheter tip projected over the small bowel loop in the left mid abdomen. IUD over the midline upper pelvis. Several pelvic phleboliths unchanged. Remaining bony structures are within normal. IMPRESSION: Nonobstructive bowel gas pattern. Electronically Signed   By: Marin Olp M.D.   On: 03/15/2016 15:51    Review of Systems  Constitutional: Positive for malaise/fatigue. Negative for fever, chills, weight loss and diaphoresis.  HENT: Negative for congestion and hearing loss.   Respiratory: Negative for cough, sputum production, shortness of breath and wheezing.   Cardiovascular: Negative for chest pain and leg swelling.  Gastrointestinal: Positive for heartburn, nausea and vomiting. Negative for abdominal pain, diarrhea, constipation and melena.  Genitourinary: Negative for dysuria and hematuria.  Musculoskeletal: Negative for neck pain.  Skin: Negative for itching and rash.  Neurological: Positive for weakness. Negative for dizziness and loss of consciousness.  Psychiatric/Behavioral: Negative for depression. The patient is not nervous/anxious.   All other systems reviewed and are negative.   Blood pressure 149/89, pulse 92, temperature 98.4 F (36.9 C), temperature source Tympanic, resp. rate 16, height _0  (1.575 m), weight 172 lb (78.019 kg), last menstrual period 02/21/2016, SpO2 100 %. Physical Exam  Vitals reviewed. Constitutional: She is oriented to person, place, and time. She appears well-developed and well-nourished. No distress.  HENT:  Head: Normocephalic  and atraumatic.  Right Ear: External ear normal.  Left Ear: External ear normal.  Nose: Nose normal.  Mouth/Throat: Oropharynx is clear and moist. No oropharyngeal exudate.  Eyes: Conjunctivae and EOM are normal. Pupils are equal, round, and reactive to light. No scleral icterus.  Neck: Normal range of motion. Neck supple. No tracheal deviation present.  Cardiovascular: Normal rate, regular rhythm and intact distal pulses.  Exam reveals no gallop and no friction rub.   No murmur heard. Respiratory: Effort normal and breath sounds normal. No respiratory distress. She has no wheezes. She has no rales.  GI: Soft. Bowel sounds are normal. She exhibits no distension. There is no tenderness. There is no rebound and no guarding.  Obese, soft, minimal epigastric tenderness, no tenderness in RUQ  Musculoskeletal: Normal range of motion. She exhibits no edema or tenderness.  Neurological: She is alert and oriented to person, place, and time. No cranial nerve deficit.  Skin: Skin is warm and dry. No rash noted. No erythema. No pallor.  Psychiatric: She has a normal mood and affect. Her behavior is normal. Judgment and thought content normal.     Assessment/Plan 40 yr old female with nausea and vomiting of unknown etiology.  I have personally reviewed her laboratory values which note normal WBC and normal LFTs.  I have personally reviewed her past medical records including past GI visits and EGDs as well as recent EGD.  I have personally reviewed her past CT scan and Ultrasound images,which do not show any gallstones, masses or abnormalities.  The patient is quite anxious and is in fear that she has cancer with her significant family history.  I discussed with her that at this  time there is nothing to indicate that she has a malignancy.   The patient also has no surgical issues.    Hubbard Robinson, MD 03/15/2016, 5:20 PM

## 2016-03-15 NOTE — Anesthesia Preprocedure Evaluation (Addendum)
Anesthesia Evaluation  Patient identified by MRN, date of birth, ID band Patient awake    Reviewed: Allergy & Precautions, NPO status , Patient's Chart, lab work & pertinent test results, reviewed documented beta blocker date and time   Airway Mallampati: II  TM Distance: >3 FB     Dental  (+) Chipped   Pulmonary sleep apnea , Current Smoker,           Cardiovascular      Neuro/Psych PSYCHIATRIC DISORDERS Anxiety Depression    GI/Hepatic PUD, GERD  Controlled,  Endo/Other    Renal/GU      Musculoskeletal   Abdominal   Peds  Hematology  (+) anemia ,   Anesthesia Other Findings Anemic 11.4.  Reproductive/Obstetrics                           Anesthesia Physical Anesthesia Plan  ASA: III  Anesthesia Plan: General   Post-op Pain Management:    Induction: Intravenous  Airway Management Planned: Nasal Cannula  Additional Equipment:   Intra-op Plan:   Post-operative Plan:   Informed Consent: I have reviewed the patients History and Physical, chart, labs and discussed the procedure including the risks, benefits and alternatives for the proposed anesthesia with the patient or authorized representative who has indicated his/her understanding and acceptance.     Plan Discussed with: CRNA  Anesthesia Plan Comments:         Anesthesia Quick Evaluation

## 2016-03-15 NOTE — Progress Notes (Signed)
[facility] [place]   PATIENT NAME: Laurie Frederick    MR#:  [id number]  DATE OF BIRTH:  Aug 22, 1976  SUBJECTIVE:   Nausea improved  She thinks her ulcer is getting worse. She had one episode of vomiting blood  REVIEW OF SYSTEMS:    Review of Systems  Constitutional: Negative for fever, chills and malaise/fatigue.  HENT: Negative for ear discharge, ear pain, hearing loss, nosebleeds and sore throat.   Eyes: Negative for blurred vision and pain.  Respiratory: Negative for cough, hemoptysis, shortness of breath and wheezing.   Cardiovascular: Negative for chest pain, palpitations and leg swelling.  Gastrointestinal: Positive for abdominal pain (improved). Negative for vomiting, diarrhea and blood in stool.  Genitourinary: Negative for dysuria.  Musculoskeletal: Negative for back pain.  Neurological: Negative for dizziness, tremors, speech change, focal weakness, seizures and headaches.  Endo/Heme/Allergies: Does not bruise/bleed easily.  Psychiatric/Behavioral: Negative for depression, suicidal ideas and hallucinations.    Tolerating Diet:Nothing by mouth      DRUG ALLERGIES:  No Known Allergies  VITALS:  Blood pressure 106/57, pulse 64, temperature 98.2 F (36.8 C), temperature source Oral, resp. rate 18, height 5\' 2"  (1.575 m), weight 78.019 kg (172 lb), last menstrual period 02/21/2016, SpO2 99 %.  PHYSICAL EXAMINATION:   Physical Exam  Constitutional: She is oriented to person, place, and time and well-developed, well-nourished, and in no distress. No distress.  HENT:  Head: Normocephalic.  Eyes: No scleral icterus.  Neck: Normal range of motion. Neck supple. No JVD present. No tracheal deviation present.  Cardiovascular: Normal rate, regular rhythm and normal heart sounds.  Exam reveals no gallop and no friction rub.   No murmur heard. Pulmonary/Chest: Effort normal and breath sounds normal. No respiratory distress. She has no  wheezes. She has no rales. She exhibits no tenderness.  Abdominal: Soft. Bowel sounds are normal. She exhibits no distension and no mass. There is tenderness (epigastric). There is no rebound and no guarding.  Musculoskeletal: Normal range of motion. She exhibits no edema.  Neurological: She is alert and oriented to person, place, and time.  Skin: Skin is warm. No rash noted. No erythema.  Psychiatric: Affect and judgment normal.      LABORATORY PANEL:   CBC  Recent Labs Lab 03/14/16 0516  WBC 5.3  HGB 11.4*  HCT 34.0*  PLT 286   ------------------------------------------------------------------------------------------------------------------  Chemistries   Recent Labs Lab 03/13/16 0755 03/14/16 0516  03/15/16 0024  NA 140 140  --  138  K 3.8 2.9*  < > 3.3*  CL 105 104  --  103  CO2 27 27  --  27  GLUCOSE 101* 97  --  88  BUN 6 8  --  5*  CREATININE 0.75 0.64  --  0.69  CALCIUM 9.5 8.8*  --  8.8*  MG  --  2.2  --   --   AST 19  --   --   --   ALT 16  --   --   --   ALKPHOS 70  --   --   --   BILITOT 0.4  --   --   --   < > = values in this interval not displayed. ------------------------------------------------------------------------------------------------------------------  Cardiac Enzymes  Recent Labs Lab 03/13/16 0755  TROPONINI <0.03   ------------------------------------------------------------------------------------------------------------------  RADIOLOGY:  Dg Abd Acute W/chest  03/13/2016  CLINICAL DATA:  Epigastric abdominal pain. History of gastric ulcer. EXAM: DG ABDOMEN ACUTE W/ 1V  CHEST COMPARISON:  CT of the abdomen pelvis dated 09/19/2015 FINDINGS: There is no evidence of dilated bowel loops or free intraperitoneal air. No radiopaque calculi or other significant radiographic abnormality is seen. Heart size and mediastinal contours are within normal limits. Both lungs are clear. IUD is noted. IMPRESSION: Negative abdominal radiographs.  No  acute cardiopulmonary disease. Electronically Signed   By: Ted Mcalpine M.D.   On: 03/13/2016 12:07     ASSESSMENT AND PLAN:   40 y.o. female with history of chronic epigastric abdominal pain, cyclical vomiting, prior diagnosis of gastritis and gastroduodenitis, who is here with acute onset of epigastric pain associated with nausea and vomiting  1. Epigastric abdominal pain and nausea and vomiting: Continue Carafate and Protonix EGD planned this morning. If this is negative into speed discharge home. Continue supportive care with antiemetics.   2. Hypokalemia: Repleted 3. Urinary tract infection, acute cystitis without hematuria: Urine culture shows mixed bacteria. She has received 3 days of antibiotics and therefore will not need antibiotics at discharge..  4. Depression: Continue Celexa.   Management plans discussed with the patient and she is in agreement.  CODE STATUS: FULL  TOTAL TIME TAKING CARE OF THIS PATIENT: 28 minutes.     POSSIBLE D/C today depending on EGD. Zacharie Portner M.D on 03/15/2016 at 10:27 AM  Between 7am to 6pm - Pager - [phone] After 6pm go to [email or web address] - password [email or web address]  [facility]  Office  [phone]  CC: Primary care physician; Domenic Schwab, FNP  Note: This dictation was prepared with Dragon dictation along with smaller phrase technology. Any transcriptional errors that result from this process are unintentional.

## 2016-03-15 NOTE — Transfer of Care (Signed)
Immediate Anesthesia Transfer of Care Note  Patient: Natasha MeadSamantha Lee Denson  Procedure(s) Performed: Procedure(s): ESOPHAGOGASTRODUODENOSCOPY (EGD) WITH PROPOFOL (N/A)  Patient Location: PACU  Anesthesia Type:General  Level of Consciousness: awake, alert , oriented and sedated  Airway & Oxygen Therapy: Patient Spontanous Breathing and Patient connected to nasal cannula oxygen  Post-op Assessment: Report given to RN and Post -op Vital signs reviewed and stable  Post vital signs: Reviewed and stable  Last Vitals:  Filed Vitals:   03/15/16 1100 03/15/16 1250  BP: 128/80 151/104  Pulse: 77 98  Temp: 36.9 C 36.9 C  Resp: 18 18    Last Pain:  Filed Vitals:   03/15/16 1254  PainSc: Asleep         Complications: No apparent anesthesia complications

## 2016-03-15 NOTE — Anesthesia Postprocedure Evaluation (Signed)
Anesthesia Post Note  Patient: Laurie Frederick  Procedure(s) Performed: Procedure(s) (LRB): ESOPHAGOGASTRODUODENOSCOPY (EGD) WITH PROPOFOL (N/A)  Patient location during evaluation: Endoscopy Anesthesia Type: General Level of consciousness: awake and alert Pain management: pain level controlled Vital Signs Assessment: post-procedure vital signs reviewed and stable Respiratory status: spontaneous breathing, nonlabored ventilation, respiratory function stable and patient connected to nasal cannula oxygen Cardiovascular status: blood pressure returned to baseline and stable Postop Assessment: no signs of nausea or vomiting Anesthetic complications: no    Last Vitals:  Filed Vitals:   03/15/16 1310 03/15/16 1320  BP: 149/66 149/89  Pulse: 96 92  Temp:    Resp: 15 16    Last Pain:  Filed Vitals:   03/15/16 1530  PainSc: 6                  Alaster Asfaw S

## 2016-03-15 NOTE — Care Management (Signed)
patient is for endoscopy today and if within normal limits would anticipate discharge home

## 2016-03-15 NOTE — Progress Notes (Signed)
Escorted via wheelchair-no distress noted

## 2016-03-15 NOTE — Care Management (Signed)
Patient has requested a surgical consult for her pain and to have her gallbladder removed.  Her EGD is normal. Attending  informed patient she will discharge today.  Patient is verbalizing that she is still in pain and needs "just one more test."  Discussed that surgery will evaluate her this afternoon and to begin arranging for transportation home.  Spoke with attending and primary nurse to reinforce discharge.  If says does not have transportation will provide taxi voucher.

## 2016-03-15 NOTE — Anesthesia Procedure Notes (Signed)
Performed by: COOK-MARTIN, Tawan Corkern Pre-anesthesia Checklist: Patient identified, Emergency Drugs available, Suction available, Patient being monitored and Timeout performed Patient Re-evaluated:Patient Re-evaluated prior to inductionOxygen Delivery Method: Nasal cannula Preoxygenation: Pre-oxygenation with 100% oxygen Intubation Type: IV induction Airway Equipment and Method: Bite block Placement Confirmation: positive ETCO2 and CO2 detector     

## 2016-03-15 NOTE — Discharge Summary (Signed)
[facility] - [place] [facility]   PATIENT NAME: Laurie Frederick    MR#:  [id number]  DATE OF BIRTH:  February 16, 1976  DATE OF ADMISSION:  03/13/2016 ADMITTING PHYSICIAN: Altamese Dilling, MD  DATE OF DISCHARGE: 03/15/2016 PRIMARY CARE PHYSICIAN: Domenic Schwab, FNP    ADMISSION DIAGNOSIS:  Anxiety [F41.9] Gastritis [K29.70] Epigastric pain [R10.13] Intractable vomiting without nausea, vomiting of unspecified type [R11.11]  DISCHARGE DIAGNOSIS:  Principal Problem:   UTI (lower urinary tract infection) Active Problems:   Nausea & vomiting   Uncontrollable vomiting   SECONDARY DIAGNOSIS:   Past Medical History  Diagnosis Date  . Sleep apnea   . GERD (gastroesophageal reflux disease)   . Hyperlipidemia   . Depression   . Anxiety   . PTSD (post-traumatic stress disorder)   . Obesity   . Anemia   . Vitamin D deficiency   . Nontoxic uninodular goiter   . Multiple gastric ulcers   . H pylori ulcer     HOSPITAL COURSE:   40 y.o. female with history of chronic epigastric abdominal pain, cyclical vomiting, prior diagnosis of gastritis and gastroduodenitis, who is here with acute onset of epigastric pain associated with nausea and vomiting  1. Epigastric abdominal pain and nausea and vomiting:Her symptoms have improved She will Continue Carafate and Protonix EGD was completely normal.   2. Hypokalemia: Repleted 3. Urinary tract infection, acute cystitis without hematuria: Urine culture shows mixed bacteria. She has received 3 days of antibiotics and therefore will not need antibiotics at discharge.  4. Depression: Continue Celexa.   DISCHARGE CONDITIONS AND DIET:  Stable regular diet  CONSULTS OBTAINED:  Treatment Team:  Scot Jun, MD Midge Minium, MD  DRUG ALLERGIES:  No Known Allergies  DISCHARGE MEDICATIONS:   Current Discharge Medication List    START taking these medications   Details  feeding supplement (BOOST /  RESOURCE BREEZE) LIQD Take 1 Container by mouth 3 (three) times daily between meals. Qty: 1 Container, Refills: 0      CONTINUE these medications which have CHANGED   Details  sucralfate (CARAFATE) 1 g tablet Take 1 tablet (1 g total) by mouth 4 (four) times daily -  with meals and at bedtime. Qty: 30 tablet, Refills: 1      CONTINUE these medications which have NOT CHANGED   Details  alprazolam (XANAX) 2 MG tablet Take 2 mg by mouth 2 (two) times daily as needed for anxiety.     citalopram (CELEXA) 40 MG tablet Take 40 mg by mouth at bedtime.    pantoprazole (PROTONIX) 40 MG tablet Take 1 tablet (40 mg total) by mouth 2 (two) times daily before a meal. Qty: 60 tablet, Refills: 0    potassium chloride SA (KLOR-CON M20) 20 MEQ tablet Take 1 tablet (20 mEq total) by mouth daily. Qty: 7 tablet, Refills: 0    promethazine (PHENERGAN) 25 MG suppository Place 1 suppository (25 mg total) rectally every 6 (six) hours as needed for nausea. Qty: 12 suppository, Refills: 1    promethazine (PHENERGAN) 25 MG tablet Take 1 tablet (25 mg total) by mouth every 6 (six) hours as needed for nausea or vomiting. Qty: 20 tablet, Refills: 0    QUEtiapine (SEROQUEL) 50 MG tablet Take 100 mg by mouth at bedtime.    zolpidem (AMBIEN) 10 MG tablet Take 10 mg by mouth at bedtime.              Today   CHIEF COMPLAINT:  Doing ok  abdominal pain better no nausea thinks she had one episode of hemoptysis due to retching   VITAL SIGNS:  Blood pressure 151/104, pulse 98, temperature 98.4 F (36.9 C), temperature source Tympanic, resp. rate 18, height 5\' 2"  (1.575 m), weight 78.019 kg (172 lb), last menstrual period 02/21/2016, SpO2 99 %.   REVIEW OF SYSTEMS:  Review of Systems  Constitutional: Negative for fever, chills and malaise/fatigue.  HENT: Negative for ear discharge, ear pain, hearing loss, nosebleeds and sore throat.   Eyes: Negative for blurred vision and pain.  Respiratory: Negative  for cough, hemoptysis, shortness of breath and wheezing.   Cardiovascular: Negative for chest pain, palpitations and leg swelling.  Gastrointestinal: Positive for abdominal pain (improved). Negative for nausea, vomiting, diarrhea and blood in stool.  Genitourinary: Negative for dysuria.  Musculoskeletal: Negative for back pain.  Neurological: Negative for dizziness, tremors, speech change, focal weakness, seizures and headaches.  Endo/Heme/Allergies: Does not bruise/bleed easily.  Psychiatric/Behavioral: Negative for depression, suicidal ideas and hallucinations.     PHYSICAL EXAMINATION:  GENERAL:  40 y.o.-year-old patient lying in the bed with no acute distress.  NECK:  Supple, no jugular venous distention. No thyroid enlargement, no tenderness.  LUNGS: Normal breath sounds bilaterally, no wheezing, rales,rhonchi  No use of accessory muscles of respiration.  CARDIOVASCULAR: S1, S2 normal. No murmurs, rubs, or gallops.  ABDOMEN: Soft, non-tender, non-distended. Bowel sounds present. No organomegaly or mass.  EXTREMITIES: No pedal edema, cyanosis, or clubbing.  PSYCHIATRIC: The patient is alert and oriented x 3.  SKIN: No obvious rash, lesion, or ulcer.   DATA REVIEW:   CBC  Recent Labs Lab 03/14/16 0516  WBC 5.3  HGB 11.4*  HCT 34.0*  PLT 286    Chemistries   Recent Labs Lab 03/13/16 0755 03/14/16 0516  03/15/16 0024  NA 140 140  --  138  K 3.8 2.9*  < > 3.3*  CL 105 104  --  103  CO2 27 27  --  27  GLUCOSE 101* 97  --  88  BUN 6 8  --  5*  CREATININE 0.75 0.64  --  0.69  CALCIUM 9.5 8.8*  --  8.8*  MG  --  2.2  --   --   AST 19  --   --   --   ALT 16  --   --   --   ALKPHOS 70  --   --   --   BILITOT 0.4  --   --   --   < > = values in this interval not displayed.  Cardiac Enzymes  Recent Labs Lab 03/13/16 0755  TROPONINI <0.03    Microbiology Results  @MICRORSLT48 @  RADIOLOGY:  No results found.    Management plans discussed with the patient  and she is in agreement. Stable for discharge home  Patient should follow up with PCP in1 week  CODE STATUS:     Code Status Orders        Start     Ordered   03/13/16 1539  Full code   Continuous     03/13/16 1538    Code Status History    Date Active Date Inactive Code Status Order ID Comments User Context   09/19/2015  2:28 AM 09/19/2015  9:48 PM Full Code [id number]  Arnaldo NatalMichael S Diamond, MD Inpatient   09/03/2015  8:36 PM 09/05/2015  7:33 PM Full Code [id number]  Wyatt Hasteavid K Hower, MD ED   07/22/2015  2:45 AM  07/26/2015  3:18 PM Full Code [id number]  Wyatt Haste, MD ED   07/13/2015  7:31 PM 07/16/2015  2:34 PM Full Code [id number]  Adrian Saran, MD Inpatient   07/05/2015 12:03 PM 07/09/2015  2:31 PM Full Code [id number]  Altamese Dilling, MD Inpatient   04/01/2015 12:35 AM 04/01/2015  6:01 PM Full Code [id number]  Severiano Gilbert, RN ED      TOTAL TIME TAKING CARE OF THIS PATIENT: 36 minutes.    Note: This dictation was prepared with Dragon dictation along with smaller phrase technology. Any transcriptional errors that result from this process are unintentional.  Clemente Dewey M.D on 03/15/2016 at 1:02 PM  Between 7am to 6pm - Pager - [phone] After 6pm go to [email or web address] - Social research officer, government  [facility]  Office  [phone]  CC: Primary care physician; Domenic Schwab, FNP

## 2016-03-15 NOTE — Progress Notes (Signed)
Discharge instructions given with prescriptions for carafate-saline lock  Discontinued-xanax returned to patient and medication storage form signed-waiting on mother for ride.

## 2016-03-15 NOTE — Progress Notes (Signed)
PHARMACY CONSULT NOTE - INITIAL  Pharmacy Consult for Electrolytes Indication: hypokalemia  No Known Allergies  Patient Measurements: Height: 5\' 2"  (157.5 cm) Weight: 172 lb (78.019 kg) IBW/kg (Calculated) : 50.1 Adjusted Body Weight:   Vital Signs: Temp: 98.2 F (36.8 C) (05/17 0505) Temp Source: Oral (05/17 0505) BP: 106/57 mmHg (05/17 0505) Pulse Rate: 64 (05/17 0505) Intake/Output from previous day: 05/16 0701 - 05/17 0700 In: 1080 [P.O.:1080] Out: -  Intake/Output from this shift:    Labs:  Recent Labs  03/13/16 0755 03/14/16 0516  WBC 6.2 5.3  HGB 13.1 11.4*  HCT 39.9 34.0*  PLT 317 286    Recent Labs  03/13/16 0755 03/14/16 0516 03/14/16 1550 03/15/16 0024  NA 140 140  --  138  K 3.8 2.9* 3.2* 3.3*  CL 105 104  --  103  CO2 27 27  --  27  GLUCOSE 101* 97  --  88  BUN 6 8  --  5*  CREATININE 0.75 0.64  --  0.69  CALCIUM 9.5 8.8*  --  8.8*  MG  --  2.2  --   --   PROT 8.3*  --   --   --   ALBUMIN 4.7  --   --   --   AST 19  --   --   --   ALT 16  --   --   --   ALKPHOS 70  --   --   --   BILITOT 0.4  --   --   --    Estimated Creatinine Clearance: 92.3 mL/min (by C-G formula based on Cr of 0.69).   No results for input(s): GLUCAP in the last 72 hours.  Medical History: Past Medical History  Diagnosis Date  . Sleep apnea   . GERD (gastroesophageal reflux disease)   . Hyperlipidemia   . Depression   . Anxiety   . PTSD (post-traumatic stress disorder)   . Obesity   . Anemia   . Vitamin D deficiency   . Nontoxic uninodular goiter   . Multiple gastric ulcers   . H pylori ulcer     Medications:  Scheduled:  . cefTRIAXone (ROCEPHIN)  IV  1 g Intravenous Q24H  . citalopram  40 mg Oral QHS  . enoxaparin (LOVENOX) injection  40 mg Subcutaneous Q24H  . feeding supplement  1 Container Oral TID BM  . ondansetron (ZOFRAN) IV  4 mg Intravenous Once  . pantoprazole  40 mg Oral BID AC  . potassium chloride  20 mEq Oral Once  . potassium  chloride  40 mEq Oral Once  . QUEtiapine  100 mg Oral QHS  . zolpidem  5 mg Oral QHS   Infusions:    Assessment: 40 yo female with Potassium= 2.9. Patient with Intractable nausea and vomiting.  Plan:  Per MD, KCL 40meq po x 1 and KCL 10meq IV x 2 have been ordered.  Magnesium level=2.2. Will recheck Potassium level at 1600. F/u labs in am.  5/16:  K @ 16:00 = 3.2  Will order KCl 10 mEq IV X 4 and recheck K on 5/17 @ 00:00.   5/17 00:00 K+ 3.3. 20 mEq KCl x1 PO ordered. Recheck BMP tomorrow AM.  Victormanuel Mclure S 03/15/2016,5:42 AM

## 2016-03-15 NOTE — Op Note (Signed)
[facility] Patient Name: Laurie Frederick Procedure Date: 03/15/2016 12:36 PM MRN: [id number] Account #: [id number] Date of Birth: 1976-04-01 Admit Type: Inpatient Age: 40 Room: [facility] ENDO ROOM 4 Gender: Female Note Status: Finalized Procedure:            Upper GI endoscopy Indications:          Epigastric abdominal pain Providers:            Midge Minium, MD Referring MD:         No Local Md, MD (Referring MD) Medicines:            Propofol per Anesthesia Complications:        No immediate complications. Procedure:            Pre-Anesthesia Assessment:                       - Prior to the procedure, a History and Physical was                        performed, and patient medications and allergies were                        reviewed. The patient's tolerance of previous                        anesthesia was also reviewed. The risks and benefits of                        the procedure and the sedation options and risks were                        discussed with the patient. All questions were                        answered, and informed consent was obtained. Prior                        Anticoagulants: The patient has taken no previous                        anticoagulant or antiplatelet agents. ASA Grade                        Assessment: II - A patient with mild systemic disease.                        After reviewing the risks and benefits, the patient was                        deemed in satisfactory condition to undergo the                        procedure.                       After obtaining informed consent, the endoscope was                        passed under direct vision. Throughout the procedure,  the patient's blood pressure, pulse, and oxygen                        saturations were monitored continuously. The Endoscope                        was introduced through the mouth, and advanced to the         second part of duodenum. The upper GI endoscopy was                        accomplished without difficulty. The patient tolerated                        the procedure well. Findings:      The examined esophagus was normal.      The entire examined stomach was normal.      The examined duodenum was normal. Impression:           - Normal esophagus.                       - Normal stomach.                       - Normal examined duodenum.                       - No specimens collected. Recommendation:       - Investigate other posible causes of her symptoms. Procedure Code(s):    --- Professional ---                       [phone], Esophagogastroduodenoscopy, flexible, transoral;                        diagnostic, including collection of specimen(s) by                        brushing or washing, when performed (separate procedure) Diagnosis Code(s):    --- Professional ---                       R10.13, Epigastric pain CPT copyright 2016 American Medical Association. All rights reserved. The codes documented in this report are preliminary and upon coder review may  be revised to meet current compliance requirements. Midge Miniumarren Fed Ceci, MD 03/15/2016 12:48:43 PM This report has been signed electronically. Number of Addenda: 0 Note Initiated On: 03/15/2016 12:36 PM      [facility]

## 2016-03-15 NOTE — Progress Notes (Signed)
   03/15/16 1600  Clinical Encounter Type  Visited With Patient  Visit Type Follow-up  Consult/Referral To Chaplain  Spiritual Encounters  Spiritual Needs Prayer  Stress Factors  Patient Stress Factors Exhausted;Health changes  Patient continues to describe severe abdominal pain. She stated that she was scheduled for release today. We prayed for healing and clarity to identify the source of her pain. Chap. Karmyn Lowman G. Stryker Veasey, ext. [phone]

## 2016-03-16 ENCOUNTER — Emergency Department
Admission: EM | Admit: 2016-03-16 | Discharge: 2016-03-16 | Disposition: A | Discharge status: Home / Self Care | Payer: Medicaid Other | Attending: Emergency Medicine | Admitting: Emergency Medicine

## 2016-03-16 ENCOUNTER — Encounter: Payer: Self-pay | Admitting: Gastroenterology

## 2016-03-16 DIAGNOSIS — R1013 Epigastric pain: Secondary | ICD-10-CM | POA: Diagnosis not present

## 2016-03-16 DIAGNOSIS — F17210 Nicotine dependence, cigarettes, uncomplicated: Secondary | ICD-10-CM | POA: Insufficient documentation

## 2016-03-16 DIAGNOSIS — R109 Unspecified abdominal pain: Secondary | ICD-10-CM | POA: Diagnosis present

## 2016-03-16 DIAGNOSIS — G8929 Other chronic pain: Secondary | ICD-10-CM | POA: Diagnosis not present

## 2016-03-16 DIAGNOSIS — Z79899 Other long term (current) drug therapy: Secondary | ICD-10-CM | POA: Diagnosis not present

## 2016-03-16 DIAGNOSIS — E785 Hyperlipidemia, unspecified: Secondary | ICD-10-CM | POA: Insufficient documentation

## 2016-03-16 DIAGNOSIS — R112 Nausea with vomiting, unspecified: Secondary | ICD-10-CM | POA: Insufficient documentation

## 2016-03-16 DIAGNOSIS — F329 Major depressive disorder, single episode, unspecified: Secondary | ICD-10-CM | POA: Insufficient documentation

## 2016-03-16 LAB — LIPASE, BLOOD: LIPASE: 22 U/L (ref 11–51)

## 2016-03-16 LAB — RAPID HIV SCREEN (HIV 1/2 AB+AG)
HIV 1/2 Antibodies: NONREACTIVE
HIV-1 P24 Antigen - HIV24: NONREACTIVE

## 2016-03-16 LAB — CBC
HEMATOCRIT: 39 % (ref 35.0–47.0)
HEMOGLOBIN: 12.8 g/dL (ref 12.0–16.0)
MCH: 28.1 pg (ref 26.0–34.0)
MCHC: 32.8 g/dL (ref 32.0–36.0)
MCV: 85.6 fL (ref 80.0–100.0)
Platelets: 321 10*3/uL (ref 150–440)
RBC: 4.55 MIL/uL (ref 3.80–5.20)
RDW: 16.3 % — ABNORMAL HIGH (ref 11.5–14.5)
WBC: 7.6 10*3/uL (ref 3.6–11.0)

## 2016-03-16 LAB — COMPREHENSIVE METABOLIC PANEL
ALBUMIN: 4.8 g/dL (ref 3.5–5.0)
ALT: 15 U/L (ref 14–54)
ANION GAP: 11 (ref 5–15)
AST: 17 U/L (ref 15–41)
Alkaline Phosphatase: 77 U/L (ref 38–126)
BILIRUBIN TOTAL: 0.6 mg/dL (ref 0.3–1.2)
BUN: 6 mg/dL (ref 6–20)
CHLORIDE: 98 mmol/L — AB (ref 101–111)
CO2: 26 mmol/L (ref 22–32)
Calcium: 9.5 mg/dL (ref 8.9–10.3)
Creatinine, Ser: 0.71 mg/dL (ref 0.44–1.00)
GFR calc Af Amer: >60 mL/min (ref >60)
GFR calc non Af Amer: >60 mL/min (ref >60)
GLUCOSE: 133 mg/dL — AB (ref 65–99)
POTASSIUM: 3.5 mmol/L (ref 3.5–5.1)
SODIUM: 135 mmol/L (ref 135–145)
TOTAL PROTEIN: 8.5 g/dL — AB (ref 6.5–8.1)

## 2016-03-16 LAB — URINALYSIS COMPLETE WITH MICROSCOPIC (ARMC ONLY)
Bilirubin Urine: NEGATIVE
Glucose, UA: NEGATIVE mg/dL
Hgb urine dipstick: NEGATIVE
NITRITE: NEGATIVE
PH: 7 (ref 5.0–8.0)
PROTEIN: 30 mg/dL — AB
SPECIFIC GRAVITY, URINE: 1.021 (ref 1.005–1.030)

## 2016-03-16 MED ORDER — PROMETHAZINE HCL 25 MG PO TABS: 25.0000 mg | ORAL_TABLET | Freq: Four times a day (QID) | ORAL | Status: DC | PRN

## 2016-03-16 MED ORDER — ONDANSETRON HCL 4 MG/2ML IJ SOLN
4.0000 mg | Freq: Once | INTRAMUSCULAR | Status: AC
  Administered 2016-03-16: 4 mg via INTRAVENOUS
  Filled 2016-03-16: qty 2

## 2016-03-16 MED ORDER — SODIUM CHLORIDE 0.9 % IV BOLUS (SEPSIS)
1000.0000 mL | Freq: Once | INTRAVENOUS | Status: AC
Start: 2016-03-16 — End: 2016-03-16
  Administered 2016-03-16: 1000 mL via INTRAVENOUS

## 2016-03-16 MED ORDER — KETOROLAC TROMETHAMINE 30 MG/ML IJ SOLN
30.0000 mg | Freq: Once | INTRAMUSCULAR | Status: AC
  Administered 2016-03-16: 30 mg via INTRAVENOUS
  Filled 2016-03-16: qty 1

## 2016-03-16 MED ORDER — LORAZEPAM 2 MG/ML IJ SOLN
1.0000 mg | Freq: Once | INTRAMUSCULAR | Status: AC
  Administered 2016-03-16: 1 mg via INTRAVENOUS
  Filled 2016-03-16: qty 1

## 2016-03-16 NOTE — ED Notes (Signed)
Pt here with chronic abdominal pain and vomiting; d/c from hospital yesterday after observation admission. Pt reports continued abdominal pain and vomiting.

## 2016-03-16 NOTE — ED Provider Notes (Signed)
[facility] Emergency Department Provider Note  Time seen: 7:50 AM  I have reviewed the triage vital signs and the nursing notes.   HISTORY  Chief Complaint Abdominal Pain    HPI Laurie Frederick is a 40 y.o. female with a past medical history of gastric reflux, hyperlipidemia, depression, anxiety, PTSD, presents the emergency department with nausea and vomiting as well as epigastric pain. According to the patient she was discharged yesterday, states since going home she has not been able to keep anything down due to the nausea. Also states she is having mild epigastric pain due to vomiting. Patient states her abdominal pain is much improved from what it was 2 days ago and the patient was admitted to the hospital for the same. Patient denies diarrhea. Black or bloody stool or vomit. Denies fever. Patient very tearful during examination. Is asking to be tested for HIV, because she has lost much weight.     Past Medical History  Diagnosis Date  . Sleep apnea   . GERD (gastroesophageal reflux disease)   . Hyperlipidemia   . Depression   . Anxiety   . PTSD (post-traumatic stress disorder)   . Obesity   . Anemia   . Vitamin D deficiency   . Nontoxic uninodular goiter   . Multiple gastric ulcers   . H pylori ulcer     Patient Active Problem List   Diagnosis Date Noted  . Abdominal pain   . Anxiety   . Uncontrollable vomiting   . UTI (lower urinary tract infection) 03/13/2016  . Abdominal pain, epigastric   . Gastritis   . Hypokalemia 08/16/2015  . Chronic prescription benzodiazepine use 08/16/2015  . Intractable nausea and vomiting 07/22/2015  . Epigastric pain   . Helicobacter positive gastritis 07/16/2015  . Gastritis and gastroduodenitis 07/16/2015  . Hyperlipidemia 07/13/2015  . Vitamin D deficiency 07/13/2015  . Thyroid nodule 07/13/2015  . Polyp of gallbladder 07/13/2015  . Liver nodule 07/13/2015  . Nausea & vomiting 07/13/2015  .  Gastroenteritis 07/06/2015  . 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 Alcohol abuse 04/01/2015    Past Surgical History  Procedure Laterality Date  . Cesarean section    . Esophagogastroduodenoscopy (egd) with propofol Left 07/07/2015    Procedure: ESOPHAGOGASTRODUODENOSCOPY (EGD) WITH PROPOFOL;  Surgeon: Scot Jun, MD;  Location: [facility] ENDOSCOPY;  Service: Endoscopy;  Laterality: Left;  . Cesarean section    . Esophagogastroduodenoscopy (egd) with propofol N/A 09/05/2015    Procedure: ESOPHAGOGASTRODUODENOSCOPY (EGD) WITH PROPOFOL;  Surgeon: Midge Minium, MD;  Location: [facility] ENDOSCOPY;  Service: Endoscopy;  Laterality: N/A;  . Dilation and curettage of uterus      x 2 TAB    Current Outpatient Rx  Name  Route  Sig  Dispense  Refill  . alprazolam (XANAX) 2 MG tablet   Oral   Take 2 mg by mouth 2 (two) times daily as needed for anxiety.          . citalopram (CELEXA) 40 MG tablet   Oral   Take 40 mg by mouth at bedtime.         . feeding supplement (BOOST / RESOURCE BREEZE) LIQD   Oral   Take 1 Container by mouth 3 (three) times daily between meals.   1 Container   0   . pantoprazole (PROTONIX) 40 MG tablet   Oral   Take 1 tablet (40 mg total) by mouth 2 (two) times daily before a meal.   60  tablet   0   . potassium chloride SA (KLOR-CON M20) 20 MEQ tablet   Oral   Take 1 tablet (20 mEq total) by mouth daily.   7 tablet   0   . promethazine (PHENERGAN) 25 MG suppository   Rectal   Place 1 suppository (25 mg total) rectally every 6 (six) hours as needed for nausea.   12 suppository   1   . promethazine (PHENERGAN) 25 MG tablet   Oral   Take 1 tablet (25 mg total) by mouth every 6 (six) hours as needed for nausea or vomiting.   20 tablet   0   . QUEtiapine (SEROQUEL) 50 MG tablet   Oral   Take 100 mg by mouth at bedtime.         . sucralfate (CARAFATE) 1 g tablet   Oral   Take 1 tablet (1 g total) by mouth 4 (four) times daily -  with meals  and at bedtime.   30 tablet   1   . zolpidem (AMBIEN) 10 MG tablet   Oral   Take 10 mg by mouth at bedtime.           Allergies Review of patient's allergies indicates no known allergies.  Family History  Problem Relation Age of Onset  . Lung cancer Father   . Cancer Father     lung cancer  . Alcohol abuse Father   . Lung cancer Paternal Aunt   . Cancer Paternal Aunt     lung  . Bone cancer Paternal Uncle   . Cancer Paternal Uncle     pancreatic  . Depression Mother   . Anxiety disorder Mother   . Arthritis Mother   . Hyperlipidemia Mother   . Hypertension Maternal Grandmother   . Diabetes Paternal Grandmother   . Heart disease Paternal Grandmother   . Hypertension Paternal Grandmother   . Asthma Son   . Vision loss Neg Hx   . COPD Neg Hx     Social History Social History  Substance Use Topics  . Smoking status: Current Some Day Smoker -- 1.00 packs/day for 17 years    Types: Cigarettes  . Smokeless tobacco: Never Used  . Alcohol Use: No     Comment: occasional    Review of Systems Constitutional: Negative for fever. Cardiovascular: Negative for chest pain. Respiratory: Negative for shortness of breath. Gastrointestinal: Mild epigastric pain. Positive for nausea and vomiting. Negative diarrhea. Genitourinary: Negative for dysuria. Musculoskeletal: Negative for back pain. Neurological: Negative for headache 10-point ROS otherwise negative.  ____________________________________________   PHYSICAL EXAM:  VITAL SIGNS: ED Triage Vitals  Enc Vitals Group     BP 03/16/16 0735 194/90 mmHg     Pulse Rate 03/16/16 0733 78     Resp 03/16/16 0733 16     Temp 03/16/16 0733 98.1 F (36.7 C)     Temp Source 03/16/16 0733 Oral     SpO2 03/16/16 0733 98 %     Weight 03/16/16 0733 171 lb (77.565 kg)     Height 03/16/16 0733 5\' 2"  (1.575 m)     Head Cir --      Peak Flow --      Pain Score 03/16/16 0733 10     Pain Loc --      Pain Edu? --      Excl. in  GC? --     Constitutional: Alert and oriented. Crying throughout exam, anxious appearing. Eyes: Normal exam ENT  Head: Normocephalic and atraumatic   Mouth/Throat: Mucous membranes are moist. Cardiovascular: Normal rate, regular rhythm. No murmur Respiratory: Normal respiratory effort without tachypnea nor retractions. Breath sounds are clear Gastrointestinal: Soft, mild epigastric tenderness to palpation. No rebound or guarding. No distention. Musculoskeletal: Nontender with normal range of motion in all extremities.  Neurologic:  Normal speech and language. No gross focal neurologic deficits Skin:  Skin is warm, dry and intact.  Psychiatric: Tearful, anxious.  ____________________________________________    INITIAL IMPRESSION / ASSESSMENT AND PLAN / ED COURSE  Pertinent labs & imaging results that were available during my care of the patient were reviewed by me and considered in my medical decision making (see chart for details).  Patient seen in the emergency department today for nausea, vomiting, epigastric pain. Patient was discharged from the hospital yesterday after admission for the same. During the patient's admission she was seen by GI medicine and had an endoscopy performed was normal results. Patient was also seen by general surgery who believe the patient did not have any surgical cause for her discomfort. Patient has had many imaging studies, many ER visits for the same. She has been referred to pain management, but does not have an appointment until September. Here the patient is tearful, states she has not been able to keep anything down since her discharge yesterday. Patient has minimal epigastric tenderness to palpation on exam. We will repeat labs, dose IV fluids Ativan and Zofran. I discussed with the patient we will not be using narcotic pain medications in the emergency department with a recent normal medical workup and chronic pain  concerns.  ----------------------------------------- 9:19 AM on 03/16/2016 -----------------------------------------  Patient's workup is normal. Labs are within normal limits. No obvious signs of dehydration. I thoroughly evaluated the patient, I have reviewed the patient's records including her discharge yesterday. At this point I did not believe I have anything to offer the patient as far as her pain control. Patient admits this is chronic pain. She is begging for pain medication in the emergency department. I discussed with the patient she needs to see her primary care physician Dr. Lacie Scotts, or pain management. She states she cannot get in with pain management until September. I informed the patient that only her primary care doctor or pain management Center can prescribe her chronic pain medications. She is now begging for just 1 dose of pain medicine so that she can go to her primary care doctor today. I once again explained to the patient that I do not feel comfortable writing her for narcotic pain medications given her chronic pain condition. I offered the patient Toradol, initially she refused, but now she is willing to try it. I discussed with the patient a discharge home with Zofran for nausea control, need to follow-up with her primary care doctor today to discuss further management. Patient is very tearful continues to request pain medication. States that we cannot discharge her home without pain medicine. At this point I do not see what else I can offer her from an emergency department standpoint. Patient will be discharged home with Zofran for nausea, Tylenol for pain control, until she can see her primary care physician.  ____________________________________________   FINAL CLINICAL IMPRESSION(S) / ED DIAGNOSES  Nausea and vomiting Epigastric pain  Minna Antis, MD 03/16/16 [id number]

## 2016-03-16 NOTE — Discharge Instructions (Signed)
Abdominal Pain, Adult °Many things can cause abdominal pain. Usually, abdominal pain is not caused by a disease and will improve without treatment. It can often be observed and treated at home. Your health care provider will do a physical exam and possibly order blood tests and X-rays to help determine the seriousness of your pain. However, in many cases, more time must pass before a clear cause of the pain can be found. Before that point, your health care provider may not know if you need more testing or further treatment. °HOME CARE INSTRUCTIONS °Monitor your abdominal pain for any changes. The following actions may help to alleviate any discomfort you are experiencing: °· Only take over-the-counter or prescription medicines as directed by your health care provider. °· Do not take laxatives unless directed to do so by your health care provider. °· Try a clear liquid diet (broth, tea, or water) as directed by your health care provider. Slowly move to a bland diet as tolerated. °SEEK MEDICAL CARE IF: °· You have unexplained abdominal pain. °· You have abdominal pain associated with nausea or diarrhea. °· You have pain when you urinate or have a bowel movement. °· You experience abdominal pain that wakes you in the night. °· You have abdominal pain that is worsened or improved by eating food. °· You have abdominal pain that is worsened with eating fatty foods. °· You have a fever. °SEEK IMMEDIATE MEDICAL CARE IF: °· Your pain does not go away within 2 hours. °· You keep throwing up (vomiting). °· Your pain is felt only in portions of the abdomen, such as the right side or the left lower portion of the abdomen. °· You pass bloody or black tarry stools. °MAKE SURE YOU: °· Understand these instructions. °· Will watch your condition. °· Will get help right away if you are not doing well or get worse. °  °This information is not intended to replace advice given to you by your health care provider. Make sure you discuss  any questions you have with your health care provider. °  °Document Released: 07/26/2005 Document Revised: 07/07/2015 Document Reviewed: 06/25/2013 °Elsevier Interactive Patient Education ©2016 [employer] ° °Nausea and Vomiting °Nausea means you feel sick to your stomach. Throwing up (vomiting) is a reflex where stomach contents come out of your mouth. °HOME CARE  °· Take medicine as told by your doctor. °· Do not force yourself to eat. However, you do need to drink fluids. °· If you feel like eating, eat a normal diet as told by your doctor. °¨ Eat rice, wheat, potatoes, bread, lean meats, yogurt, fruits, and vegetables. °¨ Avoid high-fat foods. °· Drink enough fluids to keep your pee (urine) clear or pale yellow. °· Ask your doctor how to replace body fluid losses (rehydrate). Signs of body fluid loss (dehydration) include: °¨ Feeling very thirsty. °¨ Dry lips and mouth. °¨ Feeling dizzy. °¨ Dark pee. °¨ Peeing less than normal. °¨ Feeling confused. °¨ Fast breathing or heart rate. °GET HELP RIGHT AWAY IF:  °· You have blood in your throw up. °· You have black or bloody poop (stool). °· You have a bad headache or stiff neck. °· You feel confused. °· You have bad belly (abdominal) pain. °· You have chest pain or trouble breathing. °· You do not pee at least once every 8 hours. °· You have cold, clammy skin. °· You keep throwing up after 24 to 48 hours. °· You have a fever. °MAKE SURE YOU:  °·   Understand these instructions. °· Will watch your condition. °· Will get help right away if you are not doing well or get worse. °  °This information is not intended to replace advice given to you by your health care provider. Make sure you discuss any questions you have with your health care provider. °  °Document Released: 04/03/2008 Document Revised: 01/08/2012 Document Reviewed: 03/17/2011 °Elsevier Interactive Patient Education ©2016 [employer] ° °

## 2016-03-16 NOTE — Care Management (Signed)
This patient has chronic pain issues and to follow up with surgery.  If could be assessed by surgeon in the ED that may prevent another admission.  The patient has deep seeded fear that she has cancer that is not being found and this increases her anxiety. She has had many family members diagnoses with cancer.   Yesterday before discharge she wanted "one more test done."  She does exhibit some drug seeking behavior. Attending yesterday felt any further work up should be done on  an outpatient basis.  Notified ED Care Manager

## 2016-03-17 LAB — URINE CULTURE

## 2016-04-08 ENCOUNTER — Emergency Department
Admission: EM | Admit: 2016-04-08 | Discharge: 2016-04-08 | Disposition: A | Discharge status: Home / Self Care | Payer: Medicaid Other | Attending: Student | Admitting: Student

## 2016-04-08 DIAGNOSIS — R1013 Epigastric pain: Secondary | ICD-10-CM | POA: Diagnosis not present

## 2016-04-08 DIAGNOSIS — Z79899 Other long term (current) drug therapy: Secondary | ICD-10-CM | POA: Insufficient documentation

## 2016-04-08 DIAGNOSIS — F321 Major depressive disorder, single episode, moderate: Secondary | ICD-10-CM | POA: Diagnosis not present

## 2016-04-08 DIAGNOSIS — F17210 Nicotine dependence, cigarettes, uncomplicated: Secondary | ICD-10-CM | POA: Diagnosis not present

## 2016-04-08 DIAGNOSIS — E785 Hyperlipidemia, unspecified: Secondary | ICD-10-CM | POA: Insufficient documentation

## 2016-04-08 DIAGNOSIS — R112 Nausea with vomiting, unspecified: Secondary | ICD-10-CM | POA: Diagnosis not present

## 2016-04-08 DIAGNOSIS — R1111 Vomiting without nausea: Secondary | ICD-10-CM

## 2016-04-08 LAB — COMPREHENSIVE METABOLIC PANEL
ALT: 18 U/L (ref 14–54)
ANION GAP: 13 (ref 5–15)
AST: 22 U/L (ref 15–41)
Albumin: 4.9 g/dL (ref 3.5–5.0)
Alkaline Phosphatase: 82 U/L (ref 38–126)
BUN: 10 mg/dL (ref 6–20)
CHLORIDE: 107 mmol/L (ref 101–111)
CO2: 22 mmol/L (ref 22–32)
CREATININE: 0.65 mg/dL (ref 0.44–1.00)
Calcium: 9.8 mg/dL (ref 8.9–10.3)
GFR calc non Af Amer: >60 mL/min (ref >60)
Glucose, Bld: 128 mg/dL — ABNORMAL HIGH (ref 65–99)
Potassium: 3.5 mmol/L (ref 3.5–5.1)
SODIUM: 142 mmol/L (ref 135–145)
Total Bilirubin: 0.9 mg/dL (ref 0.3–1.2)
Total Protein: 8.3 g/dL — ABNORMAL HIGH (ref 6.5–8.1)

## 2016-04-08 LAB — HCG, QUANTITATIVE, PREGNANCY

## 2016-04-08 LAB — CBC WITH DIFFERENTIAL/PLATELET
BASOS PCT: 1 %
Basophils Absolute: 0 10*3/uL (ref 0–0.1)
EOS ABS: 0 10*3/uL (ref 0–0.7)
EOS PCT: 0 %
HCT: 39.3 % (ref 35.0–47.0)
Hemoglobin: 12.8 g/dL (ref 12.0–16.0)
LYMPHS ABS: 1.2 10*3/uL (ref 1.0–3.6)
Lymphocytes Relative: 13 %
MCH: 28 pg (ref 26.0–34.0)
MCHC: 32.6 g/dL (ref 32.0–36.0)
MCV: 86 fL (ref 80.0–100.0)
Monocytes Absolute: 0.3 10*3/uL (ref 0.2–0.9)
Monocytes Relative: 4 %
Neutro Abs: 7.5 10*3/uL — ABNORMAL HIGH (ref 1.4–6.5)
Neutrophils Relative %: 82 %
PLATELETS: 279 10*3/uL (ref 150–440)
RBC: 4.56 MIL/uL (ref 3.80–5.20)
RDW: 16.9 % — ABNORMAL HIGH (ref 11.5–14.5)
WBC: 9 10*3/uL (ref 3.6–11.0)

## 2016-04-08 LAB — LIPASE, BLOOD: Lipase: 16 U/L (ref 11–51)

## 2016-04-08 MED ORDER — SODIUM CHLORIDE 0.9 % IV BOLUS (SEPSIS): 1000.0000 mL | Freq: Once | INTRAVENOUS | Status: DC

## 2016-04-08 MED ORDER — ONDANSETRON HCL 4 MG/2ML IJ SOLN
INTRAMUSCULAR | Status: AC
  Filled 2016-04-08: qty 2

## 2016-04-08 MED ORDER — ONDANSETRON HCL 4 MG/2ML IJ SOLN
4.0000 mg | Freq: Once | INTRAMUSCULAR | Status: AC
  Administered 2016-04-08: 4 mg via INTRAVENOUS

## 2016-04-08 MED ORDER — LORAZEPAM 2 MG/ML IJ SOLN
1.0000 mg | Freq: Once | INTRAMUSCULAR | Status: AC
  Administered 2016-04-08: 1 mg via INTRAVENOUS

## 2016-04-08 MED ORDER — KETOROLAC TROMETHAMINE 30 MG/ML IJ SOLN
30.0000 mg | Freq: Once | INTRAMUSCULAR | Status: AC
  Administered 2016-04-08: 30 mg via INTRAVENOUS

## 2016-04-08 MED ORDER — HALOPERIDOL LACTATE 5 MG/ML IJ SOLN
5.0000 mg | Freq: Once | INTRAMUSCULAR | Status: AC
  Administered 2016-04-08: 5 mg via INTRAVENOUS
  Filled 2016-04-08: qty 1

## 2016-04-08 MED ORDER — LORAZEPAM 2 MG/ML IJ SOLN
INTRAMUSCULAR | Status: AC
  Filled 2016-04-08: qty 1

## 2016-04-08 MED ORDER — KETOROLAC TROMETHAMINE 30 MG/ML IJ SOLN
INTRAMUSCULAR | Status: AC
  Filled 2016-04-08: qty 1

## 2016-04-08 MED ORDER — SUCRALFATE 1 G PO TABS: 1.0000 g | ORAL_TABLET | Freq: Three times a day (TID) | ORAL | Status: DC

## 2016-04-08 NOTE — ED Provider Notes (Signed)
[facility] Emergency Department Provider Note   ____________________________________________  Time seen: Approximately 7:56 PM  I have reviewed the triage vital signs and the nursing notes.   HISTORY  Chief Complaint Abdominal Pain    HPI Laurie Frederick is a 40 y.o. female with a past medical history of  well-documented chronic abdominal pain as well as frequent episodes of intractable nausea and vomiting secondary to gastritis, normal EGD during hospitalization last month, gastric reflux, hyperlipidemia, depression, anxiety, PTSD who presents for evaluation of epigastric abdominal pain as well as nausea and vomiting since yesterday, gradual onset, constant, severe by her report, no modifying factors to her she reports that this feels alert to her usual flares of abdominal pain and vomiting. No fevers or chills. No chest pain. She is scheduled to establish care with pain clinic in September.   Past Medical History  Diagnosis Date  . Sleep apnea   . GERD (gastroesophageal reflux disease)   . Hyperlipidemia   . Depression   . Anxiety   . PTSD (post-traumatic stress disorder)   . Obesity   . Anemia   . Vitamin D deficiency   . Nontoxic uninodular goiter   . Multiple gastric ulcers   . H pylori ulcer     Patient Active Problem List   Diagnosis Date Noted  . Abdominal pain   . Anxiety   . Uncontrollable vomiting   . UTI (lower urinary tract infection) 03/13/2016  . Abdominal pain, epigastric   . Gastritis   . Hypokalemia 08/16/2015  . Chronic prescription benzodiazepine use 08/16/2015  . Intractable nausea and vomiting 07/22/2015  . Epigastric pain   . Helicobacter positive gastritis 07/16/2015  . Gastritis and gastroduodenitis 07/16/2015  . Hyperlipidemia 07/13/2015  . Vitamin D deficiency 07/13/2015  . Thyroid nodule 07/13/2015  . Polyp of gallbladder 07/13/2015  . Liver nodule 07/13/2015  . Nausea & vomiting 07/13/2015  .  Gastroenteritis 07/06/2015  . 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 Alcohol abuse 04/01/2015    Past Surgical History  Procedure Laterality Date  . Cesarean section    . Esophagogastroduodenoscopy (egd) with propofol Left 07/07/2015    Procedure: ESOPHAGOGASTRODUODENOSCOPY (EGD) WITH PROPOFOL;  Surgeon: Scot Jun, MD;  Location: [facility] ENDOSCOPY;  Service: Endoscopy;  Laterality: Left;  . Cesarean section    . Esophagogastroduodenoscopy (egd) with propofol N/A 09/05/2015    Procedure: ESOPHAGOGASTRODUODENOSCOPY (EGD) WITH PROPOFOL;  Surgeon: Midge Minium, MD;  Location: [facility] ENDOSCOPY;  Service: Endoscopy;  Laterality: N/A;  . Dilation and curettage of uterus      x 2 TAB  . Esophagogastroduodenoscopy (egd) with propofol N/A 03/15/2016    Procedure: ESOPHAGOGASTRODUODENOSCOPY (EGD) WITH PROPOFOL;  Surgeon: Midge Minium, MD;  Location: [facility] ENDOSCOPY;  Service: Endoscopy;  Laterality: N/A;    Current Outpatient Rx  Name  Route  Sig  Dispense  Refill  . alprazolam (XANAX) 2 MG tablet   Oral   Take 2 mg by mouth 2 (two) times daily as needed for anxiety.          . citalopram (CELEXA) 40 MG tablet   Oral   Take 40 mg by mouth at bedtime.         . feeding supplement (BOOST / RESOURCE BREEZE) LIQD   Oral   Take 1 Container by mouth 3 (three) times daily between meals.   1 Container   0   . pantoprazole (PROTONIX) 40 MG tablet   Oral   Take 1 tablet (  40 mg total) by mouth 2 (two) times daily before a meal.   60 tablet   0   . potassium chloride SA (KLOR-CON M20) 20 MEQ tablet   Oral   Take 1 tablet (20 mEq total) by mouth daily.   7 tablet   0   . promethazine (PHENERGAN) 25 MG tablet   Oral   Take 1 tablet (25 mg total) by mouth every 6 (six) hours as needed for nausea or vomiting.   20 tablet   0   . QUEtiapine (SEROQUEL) 50 MG tablet   Oral   Take 100 mg by mouth at bedtime.         . sucralfate (CARAFATE) 1 g tablet   Oral   Take 1 tablet (1 g  total) by mouth 4 (four) times daily -  with meals and at bedtime.   30 tablet   1   . zolpidem (AMBIEN) 10 MG tablet   Oral   Take 10 mg by mouth at bedtime.           Allergies Review of patient's allergies indicates no known allergies.  Family History  Problem Relation Age of Onset  . Lung cancer Father   . Cancer Father     lung cancer  . Alcohol abuse Father   . Lung cancer Paternal Aunt   . Cancer Paternal Aunt     lung  . Bone cancer Paternal Uncle   . Cancer Paternal Uncle     pancreatic  . Depression Mother   . Anxiety disorder Mother   . Arthritis Mother   . Hyperlipidemia Mother   . Hypertension Maternal Grandmother   . Diabetes Paternal Grandmother   . Heart disease Paternal Grandmother   . Hypertension Paternal Grandmother   . Asthma Son   . Vision loss Neg Hx   . COPD Neg Hx     Social History Social History  Substance Use Topics  . Smoking status: Current Some Day Smoker -- 1.00 packs/day for 17 years    Types: Cigarettes  . Smokeless tobacco: Never Used  . Alcohol Use: No     Comment: occasional    Review of Systems Constitutional: No fever/chills Eyes: No visual changes. ENT: No sore throat. Cardiovascular: Denies chest pain. Respiratory: Denies shortness of breath. Gastrointestinal: + abdominal pain.  + nausea, + vomiting.  No diarrhea.  No constipation. Genitourinary: Negative for dysuria. Musculoskeletal: Negative for back pain. Skin: Negative for rash. Neurological: Negative for headaches, focal weakness or numbness.  10-point ROS otherwise negative.  ____________________________________________   PHYSICAL EXAM:  VITAL SIGNS: ED Triage Vitals  Enc Vitals Group     BP 04/08/16 1934 166/136 mmHg     Pulse Rate 04/08/16 1933 90     Resp --      Temp 04/08/16 1933 98.1 F (36.7 C)     Temp Source 04/08/16 1933 Oral     SpO2 04/08/16 1933 100 %     Weight 04/08/16 1933 165 lb (74.844 kg)     Height 04/08/16 1933 5\' 3"   (1.6 m)     Head Cir --      Peak Flow --      Pain Score --      Pain Loc --      Pain Edu? --      Excl. in GC? --     Constitutional: Alert and oriented. Crying and moaning loudly with heavy dramatic overlay. Eyes: Conjunctivae are normal. PERRL. EOMI.  Head: Atraumatic. Nose: No congestion/rhinnorhea. Mouth/Throat: Mucous membranes are moist.  Oropharynx non-erythematous. Neck: No stridor.  Cardiovascular: Normal rate, regular rhythm. Grossly normal heart sounds.  Good peripheral circulation. Respiratory: Normal respiratory effort.  No retractions. Lungs CTAB. Gastrointestinal: Soft with mild epigastric tenderness to palpation. No rebound or guarding and, no distention or rigidity. No CVA tenderness. Genitourinary: deferred Musculoskeletal: No lower extremity tenderness nor edema.  No joint effusions. Neurologic:  Normal speech and language. No gross focal neurologic deficits are appreciated. No gait instability. Skin:  Skin is warm, dry and intact. No rash noted. Psychiatric: Patient with heavy dramatic overlay, wailing loudly which increases in volume and intensity when I discussed with her that we are not treating her pain with narcotics.  ____________________________________________   LABS (all labs ordered are listed, but only abnormal results are displayed)  Labs Reviewed  CBC WITH DIFFERENTIAL/PLATELET - Abnormal; Notable for the following:    RDW 16.9 (*)    Neutro Abs 7.5 (*)    All other components within normal limits  COMPREHENSIVE METABOLIC PANEL - Abnormal; Notable for the following:    Glucose, Bld 128 (*)    Total Protein 8.3 (*)    All other components within normal limits  LIPASE, BLOOD  HCG, QUANTITATIVE, PREGNANCY  URINALYSIS COMPLETEWITH MICROSCOPIC ([facility] ONLY)    ____________________________________________  EKG  none ____________________________________________  RADIOLOGY  none ____________________________________________   PROCEDURES  Procedure(s) performed: None  Critical Care performed: No  ____________________________________________   INITIAL IMPRESSION / ASSESSMENT AND PLAN / ED COURSE  Pertinent labs & imaging results that were available during my care of the patient were reviewed by me and considered in my medical decision making (see chart for details).  Geronimo RunningSamantha Lee Christoffersen is a 40 y.o. female with a past medical history of  well-documented chronic abdominal pain as well as frequent episodes of intractable nausea and vomiting secondary to gastritis, normal EGD during hospitalization last month, gastric reflux, hyperlipidemia, depression, anxiety, PTSD who presents for evaluation of epigastric abdominal pain as well as nausea and vomiting since yesterday. On exam, she is wailing loudly, she is making this sound as if she is crying however there are no tears coming from her eyes. She has the faintest tenderness to palpation in the epigastrium but the remainder of her abdominal exam is benign. CBC, CMP, lipase unremarkable, negative pregnancy test. I suspect this is a flare of her chronic pain, chronic nausea and vomiting. She was hospitalized here last month for this, had a GI consultation, unremarkable EGD, had a surgical consultation and she has had multiple CT scans in the past with no clear etiology for her symptoms. I discussed with her that it is hospital policy for us not to treat chronic pain with narcotic pain medications after which she became quite upset and threw herself on the floor. I offered Ativan, Zofran, IV fluids which she accepted reluctantly stating that she needs something "much stronger". Will continue to treat her symptomatically with nonnarcotic options. Care transferred to Dr. Cyril LoosenKinner at 8:50  pm. ____________________________________________   FINAL CLINICAL IMPRESSION(S) / ED DIAGNOSES  Final diagnoses:  Epigastric pain  Vomiting without nausea, intractability of vomiting not specified, unspecified vomiting type      NEW MEDICATIONS STARTED DURING THIS VISIT:  New Prescriptions   No medications on file     Note:  This document was prepared using Dragon voice recognition software and may include unintentional dictation errors.    Gayla DossEryka A Tre Sanker, MD 04/08/16 2104

## 2016-04-08 NOTE — ED Notes (Signed)
Patient reports vomiting and abdominal pain since seeing her primary MD and starting a blood pressure medication and Wellbutrin yesterday.

## 2016-04-08 NOTE — ED Notes (Signed)
Pt found out in 12 hallway bed. Pt informed she had to stay in room

## 2016-04-08 NOTE — ED Notes (Signed)
Pt placed on monitor. Pt informed pt had to remain in bed and on monitor after medication administration. Pt took herself off monitor and laid across hallway bed. Pt went back into room voluntarily.

## 2016-04-08 NOTE — ED Notes (Signed)
Pt asked for pain medication. Pt reminded Toradol was offered by MD Inocencio HomesGayle and refused. PT states: "Toradol doesn't work. I need stronger". MD informed

## 2016-04-08 NOTE — ED Notes (Signed)
Reviewed d/c instructions, follow-up care, and prescription with pt. Pt verbalized understanding 

## 2016-04-08 NOTE — ED Notes (Signed)
MD Kinner at bedside  

## 2016-04-08 NOTE — ED Notes (Signed)
MD Gayle at bedside. 

## 2016-04-08 NOTE — ED Notes (Signed)
Iv attempt to left AC unsuccessful; pt writhing on the bed, unable to be still; pt's primary RN notified of attempt

## 2016-04-08 NOTE — ED Provider Notes (Signed)
Assuming care of this patient from Dr. Inocencio HomesGayle. She is well-known to our emergency department. She has chronic abdominal pain and is extraordinarily dramatic. She is requesting Dilaudid. We will not provide her with narcotics however we will treat her nausea. She has had good response with Haldol the past we will try this and 50 cc normal saline bag.  ----------------------------------------- 9:34 PM on 04/08/2016 -----------------------------------------  Patient continues to come out to the secretary's desk despite lasting her to stay in her room multiple times. Her labs have returned and are unremarkable. Her vital signs are stable. She has a history of hypertension for which she was [facility].  ----------------------------------------- 10:05 PM on 04/08/2016 -----------------------------------------  Patient is resting in her bed, she reports she does feel better. Given normal labs and numerous extensive workups in the past the patient is appropriate for discharge at this point.  Jene Everyobert Jevon Littlepage, MD 04/08/16 2206

## 2016-04-08 NOTE — Discharge Instructions (Signed)

## 2016-04-08 NOTE — ED Notes (Signed)
Pt out to nurse's station with mother requesting pain medication; Dr Cyril LoosenKinner over to talk to pt; pt and mother returning to room

## 2016-04-08 NOTE — ED Notes (Signed)
Unable to start IV fluids due to the fact that pt will not stay in bed. MD informed. MD said ok to hold fluids

## 2016-04-11 ENCOUNTER — Emergency Department
Admission: EM | Admit: 2016-04-11 | Discharge: 2016-04-12 | Disposition: A | Discharge status: Home / Self Care | Payer: Medicaid Other | Attending: Emergency Medicine | Admitting: Emergency Medicine

## 2016-04-11 DIAGNOSIS — E785 Hyperlipidemia, unspecified: Secondary | ICD-10-CM | POA: Insufficient documentation

## 2016-04-11 DIAGNOSIS — F17210 Nicotine dependence, cigarettes, uncomplicated: Secondary | ICD-10-CM | POA: Diagnosis not present

## 2016-04-11 DIAGNOSIS — F329 Major depressive disorder, single episode, unspecified: Secondary | ICD-10-CM | POA: Insufficient documentation

## 2016-04-11 DIAGNOSIS — R112 Nausea with vomiting, unspecified: Secondary | ICD-10-CM

## 2016-04-11 DIAGNOSIS — F39 Unspecified mood [affective] disorder: Secondary | ICD-10-CM | POA: Diagnosis present

## 2016-04-11 DIAGNOSIS — F321 Major depressive disorder, single episode, moderate: Secondary | ICD-10-CM | POA: Diagnosis present

## 2016-04-11 DIAGNOSIS — T887XXA Unspecified adverse effect of drug or medicament, initial encounter: Secondary | ICD-10-CM | POA: Insufficient documentation

## 2016-04-11 DIAGNOSIS — T50902A Poisoning by unspecified drugs, medicaments and biological substances, intentional self-harm, initial encounter: Secondary | ICD-10-CM

## 2016-04-11 DIAGNOSIS — Y829 Unspecified medical devices associated with adverse incidents: Secondary | ICD-10-CM | POA: Insufficient documentation

## 2016-04-11 DIAGNOSIS — Z8719 Personal history of other diseases of the digestive system: Secondary | ICD-10-CM | POA: Diagnosis not present

## 2016-04-11 DIAGNOSIS — G8929 Other chronic pain: Secondary | ICD-10-CM | POA: Insufficient documentation

## 2016-04-11 DIAGNOSIS — T43592A Poisoning by other antipsychotics and neuroleptics, intentional self-harm, initial encounter: Secondary | ICD-10-CM | POA: Insufficient documentation

## 2016-04-11 DIAGNOSIS — R109 Unspecified abdominal pain: Secondary | ICD-10-CM

## 2016-04-11 DIAGNOSIS — Z79899 Other long term (current) drug therapy: Secondary | ICD-10-CM | POA: Insufficient documentation

## 2016-04-11 DIAGNOSIS — E876 Hypokalemia: Secondary | ICD-10-CM | POA: Diagnosis not present

## 2016-04-11 DIAGNOSIS — T50901A Poisoning by unspecified drugs, medicaments and biological substances, accidental (unintentional), initial encounter: Secondary | ICD-10-CM | POA: Diagnosis not present

## 2016-04-11 DIAGNOSIS — T43222A Poisoning by selective serotonin reuptake inhibitors, intentional self-harm, initial encounter: Secondary | ICD-10-CM | POA: Diagnosis not present

## 2016-04-11 DIAGNOSIS — T43595A Adverse effect of other antipsychotics and neuroleptics, initial encounter: Secondary | ICD-10-CM | POA: Diagnosis not present

## 2016-04-11 DIAGNOSIS — R1013 Epigastric pain: Secondary | ICD-10-CM | POA: Insufficient documentation

## 2016-04-11 DIAGNOSIS — F419 Anxiety disorder, unspecified: Secondary | ICD-10-CM | POA: Diagnosis present

## 2016-04-11 LAB — URINALYSIS COMPLETE WITH MICROSCOPIC (ARMC ONLY)
BILIRUBIN URINE: NEGATIVE
GLUCOSE, UA: NEGATIVE mg/dL
NITRITE: NEGATIVE
Protein, ur: 100 mg/dL — AB
SPECIFIC GRAVITY, URINE: 1.023 (ref 1.005–1.030)
Squamous Epithelial / LPF: NONE SEEN
pH: 7 (ref 5.0–8.0)

## 2016-04-11 LAB — COMPREHENSIVE METABOLIC PANEL
ALBUMIN: 5.1 g/dL — AB (ref 3.5–5.0)
ALK PHOS: 77 U/L (ref 38–126)
ALT: 16 U/L (ref 14–54)
AST: 16 U/L (ref 15–41)
Anion gap: 11 (ref 5–15)
BILIRUBIN TOTAL: 0.9 mg/dL (ref 0.3–1.2)
BUN: 12 mg/dL (ref 6–20)
CALCIUM: 9.7 mg/dL (ref 8.9–10.3)
CO2: 31 mmol/L (ref 22–32)
Chloride: 97 mmol/L — ABNORMAL LOW (ref 101–111)
Creatinine, Ser: 0.84 mg/dL (ref 0.44–1.00)
GFR calc Af Amer: >60 mL/min (ref >60)
GFR calc non Af Amer: >60 mL/min (ref >60)
GLUCOSE: 104 mg/dL — AB (ref 65–99)
POTASSIUM: 2.9 mmol/L — AB (ref 3.5–5.1)
SODIUM: 139 mmol/L (ref 135–145)
TOTAL PROTEIN: 8.8 g/dL — AB (ref 6.5–8.1)

## 2016-04-11 LAB — BASIC METABOLIC PANEL
ANION GAP: 8 (ref 5–15)
BUN: 7 mg/dL (ref 6–20)
CHLORIDE: 100 mmol/L — AB (ref 101–111)
CO2: 28 mmol/L (ref 22–32)
Calcium: 9.2 mg/dL (ref 8.9–10.3)
Creatinine, Ser: 0.65 mg/dL (ref 0.44–1.00)
GFR calc Af Amer: >60 mL/min (ref >60)
GLUCOSE: 109 mg/dL — AB (ref 65–99)
POTASSIUM: 3.4 mmol/L — AB (ref 3.5–5.1)
Sodium: 136 mmol/L (ref 135–145)

## 2016-04-11 LAB — BLOOD GAS, ARTERIAL
Acid-Base Excess: 9.7 mmol/L — ABNORMAL HIGH (ref 0.0–3.0)
Bicarbonate: 33.5 mEq/L — ABNORMAL HIGH (ref 21.0–28.0)
FIO2: 0.21
O2 Saturation: 98.2 %
PATIENT TEMPERATURE: 37
PCO2 ART: 41 mmHg (ref 32.0–48.0)
PH ART: 7.52 — AB (ref 7.350–7.450)
pO2, Arterial: 97 mmHg (ref 83.0–108.0)

## 2016-04-11 LAB — URINE DRUG SCREEN, QUALITATIVE (ARMC ONLY)
Amphetamines, Ur Screen: NOT DETECTED
BARBITURATES, UR SCREEN: NOT DETECTED
Benzodiazepine, Ur Scrn: POSITIVE — AB
CANNABINOID 50 NG, UR ~~LOC~~: NOT DETECTED
COCAINE METABOLITE, UR ~~LOC~~: NOT DETECTED
MDMA (Ecstasy)Ur Screen: NOT DETECTED
Methadone Scn, Ur: NOT DETECTED
OPIATE, UR SCREEN: NOT DETECTED
PHENCYCLIDINE (PCP) UR S: NOT DETECTED
Tricyclic, Ur Screen: POSITIVE — AB

## 2016-04-11 LAB — CBC WITH DIFFERENTIAL/PLATELET
BASOS ABS: 0 10*3/uL (ref 0–0.1)
BASOS PCT: 0 %
EOS ABS: 0.1 10*3/uL (ref 0–0.7)
Eosinophils Relative: 1 %
HEMATOCRIT: 41.1 % (ref 35.0–47.0)
HEMOGLOBIN: 13.6 g/dL (ref 12.0–16.0)
Lymphocytes Relative: 23 %
Lymphs Abs: 2.4 10*3/uL (ref 1.0–3.6)
MCH: 27.9 pg (ref 26.0–34.0)
MCHC: 33.2 g/dL (ref 32.0–36.0)
MCV: 84 fL (ref 80.0–100.0)
Monocytes Absolute: 0.9 10*3/uL (ref 0.2–0.9)
Monocytes Relative: 9 %
NEUTROS ABS: 7 10*3/uL — AB (ref 1.4–6.5)
NEUTROS PCT: 67 %
Platelets: 322 10*3/uL (ref 150–440)
RBC: 4.89 MIL/uL (ref 3.80–5.20)
RDW: 16.9 % — ABNORMAL HIGH (ref 11.5–14.5)
WBC: 10.4 10*3/uL (ref 3.6–11.0)

## 2016-04-11 LAB — ACETAMINOPHEN LEVEL: Acetaminophen (Tylenol), Serum: <10 ug/mL — ABNORMAL LOW (ref 10–30)

## 2016-04-11 LAB — ETHANOL: Alcohol, Ethyl (B): <5 mg/dL (ref <5)

## 2016-04-11 LAB — SALICYLATE LEVEL

## 2016-04-11 MED ORDER — ACETAMINOPHEN 500 MG PO TABS
ORAL_TABLET | ORAL | Status: AC
  Administered 2016-04-11: 1000 mg via ORAL
  Filled 2016-04-11: qty 2

## 2016-04-11 MED ORDER — SODIUM CHLORIDE 0.9 % IV BOLUS (SEPSIS)
1000.0000 mL | Freq: Once | INTRAVENOUS | Status: AC
  Administered 2016-04-11: 1000 mL via INTRAVENOUS

## 2016-04-11 MED ORDER — HALOPERIDOL LACTATE 5 MG/ML IJ SOLN
5.0000 mg | Freq: Once | INTRAMUSCULAR | Status: AC
  Administered 2016-04-11: 5 mg via INTRAVENOUS

## 2016-04-11 MED ORDER — HALOPERIDOL LACTATE 5 MG/ML IJ SOLN
INTRAMUSCULAR | Status: AC
  Administered 2016-04-11: 5 mg via INTRAVENOUS
  Filled 2016-04-11: qty 1

## 2016-04-11 MED ORDER — ACETAMINOPHEN 500 MG PO TABS
1000.0000 mg | ORAL_TABLET | Freq: Once | ORAL | Status: AC
  Administered 2016-04-11: 1000 mg via ORAL

## 2016-04-11 MED ORDER — HALOPERIDOL LACTATE 5 MG/ML IJ SOLN: 5.0000 mg | Freq: Once | INTRAMUSCULAR | Status: DC

## 2016-04-11 MED ORDER — FOSFOMYCIN TROMETHAMINE 3 G PO PACK
3.0000 g | PACK | Freq: Once | ORAL | Status: AC
  Administered 2016-04-12: 3 g via ORAL
  Filled 2016-04-11: qty 3

## 2016-04-11 MED ORDER — KCL IN DEXTROSE-NACL 40-5-0.45 MEQ/L-%-% IV SOLN
INTRAVENOUS | Status: DC
  Administered 2016-04-11: 10:00:00 via INTRAVENOUS
  Filled 2016-04-11 (×3): qty 1000

## 2016-04-11 NOTE — ED Notes (Signed)
Spoke with Dr Dolores FrameSung regarding pt's blood pressure being elevated.  EDP states to monitor patient for now.  No changes to plan of care at this time.

## 2016-04-11 NOTE — ED Notes (Signed)
BEHAVIORAL HEALTH ROUNDING Patient sleeping: Yes.   Patient alert and oriented: sleeping Behavior appropriate: Yes.  ; If no, describe:  Nutrition and fluids offered: Yes  Toileting and hygiene offered: Yes  Sitter present: q 15 minute checks Law enforcement present: Yes  

## 2016-04-11 NOTE — ED Provider Notes (Signed)
The patient has been psychiatrically cleared and her involuntary commitment paperwork has been rescinded. We are still awaiting the 24-hour observation for her Celexa overdose. If she has no evidence of arrhythmia at that time, she'll be discharged home with close psychiatric and primary care follow-up.  Rockne MenghiniAnne-Caroline Nalah Macioce, MD 04/11/16 [phone]

## 2016-04-11 NOTE — Discharge Instructions (Signed)
Return to the emergency department if you develop palpitations, lightheadedness or fainting, thoughts of hurting herself or anyone else, hallucinations, or any other symptoms concerning to you.

## 2016-04-11 NOTE — ED Notes (Signed)
Pt brought in by ems and c/o lower abd pain for the last year that has gotten worse over the last two days - she was seen in the er yesterday per the pt and the pt stated "I didn't get anything" - Pt reports she is vomiting 20 times in the last 24 hours

## 2016-04-11 NOTE — ED Notes (Signed)
BEHAVIORAL HEALTH ROUNDING Patient sleeping: No. Patient alert and oriented: yes Behavior appropriate: Yes.  ; If no, describe:  Nutrition and fluids offered: Yes  Toileting and hygiene offered: YEs Sitter present: q 15 minute checks, door open Law enforcement present: Yes

## 2016-04-11 NOTE — ED Notes (Signed)
Spoke with Patty at poison control.  Recommends cardiac monitoring for 24 hours due to patient's change in story regarding medications taken, when and how.  Notified EDP.

## 2016-04-11 NOTE — Consult Note (Signed)
[facility] Consult   Reason for Consult:  Consult for 40 year old woman came into the emergency room after taking an overdose of her psychiatric medicine Referring Physician:  Mariea Clonts Patient Identification: Laurie Frederick MRN:  [id number] Principal Diagnosis: Overdose Diagnosis:   Patient Active Problem List   Diagnosis Date Noted  . Overdose [T50.901A] 04/11/2016  . Abdominal pain [R10.9]   . Anxiety [F41.9]   . Uncontrollable vomiting [R11.10]   . UTI (lower urinary tract infection) [N39.0] 03/13/2016  . Abdominal pain, epigastric [R10.13]   . Gastritis [K29.70]   . Hypokalemia [E87.6] 08/16/2015  . 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 Intractable nausea and vomiting [R11.10] 07/22/2015  . Epigastric pain [R10.13]   . Helicobacter positive gastritis [B96.81] 07/16/2015  . Gastritis and gastroduodenitis [K29.70, K29.90] 07/16/2015  . Hyperlipidemia [E78.5] 07/13/2015  . Vitamin D deficiency [E55.9] 07/13/2015  . Thyroid nodule [E04.1] 07/13/2015  . Polyp of gallbladder [K82.4] 07/13/2015  . Liver nodule [K76.89] 07/13/2015  . Nausea & vomiting [R11.2] 07/13/2015  . Gastroenteritis [K52.9] 07/06/2015  . 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 Alcohol abuse [F10.10] 04/01/2015    Total Time spent with patient: 1 hour  Subjective:   Laurie Frederick is a 40 y.o. female patient admitted with "I just wanted to get some sleep".  HPI:  Patient interviewed. Chart reviewed. 40 year old woman with a history of prior overdoses and behavior problems came into the emergency room after taking an overdose of Seroquel and Celexa. Patient says that she hadn't been sleeping for the last few days because of her chronic abdominal pain. She thought that if she took an extra large amount of Seroquel she would be able to get a good night sleep. Patient says that her mood had been okay until a few days ago when the pain got bad. She  denies that she was having any suicidal ideation. Denies any psychotic symptoms. Denies that she's been abusing drugs or alcohol.  Medical history: Patient has chronic abdominal pain and presents multiple times to the emergency room with complaints of pain. It times in the past has been thought to be largely seeking medicine. Followed by outpatient providers for her GI complaints.  Social history: Not working. Lives with her mother and her children.  Substance abuse history: Denies that she is abusing alcohol. Past history of questionable manipulation for pain medicine but not clear cut substance abuse history  Past Psychiatric History: Patient has had prior presentations under similar circumstances and has also reported having no intention to kill her self. Seems to over patted probably miss used her medication irresponsibly. She does see an outpatient psychiatric provider and is treated with Seroquel. She says that she was just recently changed to Wellbutrin a new antidepressant for her. She is currently getting her medicines from her primary care physician, nurse practitioner.  Risk to Self: Suicidal Ideation: No Suicidal Intent: No Is patient at risk for suicide?: No Suicidal Plan?: No Access to Means: No What has been your use of drugs/alcohol within the last 12 months?: denied use of drugs or alcohol How many times?: 0 Other Self Harm Risks: denied Triggers for Past Attempts: None known Intentional Self Injurious Behavior: None Risk to Others: Homicidal Ideation: No Thoughts of Harm to Others: No Current Homicidal Intent: No Current Homicidal Plan: No Access to Homicidal Means: No Identified Victim: none identified History of harm to others?: No Assessment of Violence: None Noted Violent Behavior Description: denied Does patient have access  to weapons?: No Criminal Charges Pending?: No Does patient have a court date: Yes Court Date: 05/19/16 Prior Inpatient Therapy: Prior  Inpatient Therapy: No Prior Therapy Dates: n/a Prior Therapy Facilty/Provider(s): n/a Reason for Treatment: n/a Prior Outpatient Therapy: Prior Outpatient Therapy: Yes Prior Therapy Dates: Current Prior Therapy Facilty/Provider(s): Dr. Rosine Door, [place] Reason for Treatment: Depression and anxiety Does patient have an ACCT team?: No Does patient have Intensive In-House Services?  : No Does patient have [facility] services? : No Does patient have P4CC services?: No  Past Medical History:  Past Medical History  Diagnosis Date  . Sleep apnea   . GERD (gastroesophageal reflux disease)   . Hyperlipidemia   . Depression   . Anxiety   . PTSD (post-traumatic stress disorder)   . Obesity   . Anemia   . Vitamin D deficiency   . Nontoxic uninodular goiter   . Multiple gastric ulcers   . H pylori ulcer     Past Surgical History  Procedure Laterality Date  . Cesarean section    . Esophagogastroduodenoscopy (egd) with propofol Left 07/07/2015    Procedure: ESOPHAGOGASTRODUODENOSCOPY (EGD) WITH PROPOFOL;  Surgeon: Manya Silvas, MD;  Location: [facility];  Service: Endoscopy;  Laterality: Left;  . Cesarean section    . Esophagogastroduodenoscopy (egd) with propofol N/A 09/05/2015    Procedure: ESOPHAGOGASTRODUODENOSCOPY (EGD) WITH PROPOFOL;  Surgeon: Lucilla Lame, MD;  Location: [facility] ENDOSCOPY;  Service: Endoscopy;  Laterality: N/A;  . Dilation and curettage of uterus      x 2 TAB  . Esophagogastroduodenoscopy (egd) with propofol N/A 03/15/2016    Procedure: ESOPHAGOGASTRODUODENOSCOPY (EGD) WITH PROPOFOL;  Surgeon: Lucilla Lame, MD;  Location: [facility] ENDOSCOPY;  Service: Endoscopy;  Laterality: N/A;   Family History:  Family History  Problem Relation Age of Onset  . Lung cancer Father   . Cancer Father     lung cancer  . Alcohol abuse Father   . Lung cancer Paternal Aunt   . Cancer Paternal Aunt     lung  . Bone cancer Paternal Uncle   . Cancer Paternal Uncle      pancreatic  . Depression Mother   . Anxiety disorder Mother   . Arthritis Mother   . Hyperlipidemia Mother   . Hypertension Maternal Grandmother   . Diabetes Paternal Grandmother   . Heart disease Paternal Grandmother   . Hypertension Paternal Grandmother   . Asthma Son   . Vision loss Neg Hx   . COPD Neg Hx    Family Psychiatric  History: Patient says there is anxiety and depression throughout her mother's side of the family. Social History:  History  Alcohol Use No    Comment: occasional     History  Drug Use No    Social History   Social History  . Marital Status: Single    Spouse Name: N/A  . Number of Children: N/A  . Years of Education: N/A   Social History Main Topics  . Smoking status: Current Some Day Smoker -- 1.00 packs/day for 17 years    Types: Cigarettes  . Smokeless tobacco: Never Used  . Alcohol Use: No     Comment: occasional  . Drug Use: No  . Sexual Activity: Not Currently    Birth Control/ Protection: IUD   Other Topics Concern  . None   Social History Narrative   ** Merged History Encounter **       Additional Social History:    Allergies:  No Known  Allergies  Labs:  Results for orders placed or performed during the hospital encounter of 04/11/16 (from the past 48 hour(s))  Blood gas, arterial     Status: Abnormal   Collection Time: 04/11/16  4:31 AM  Result Value Ref Range   FIO2 0.21    pH, Arterial 7.52 (H) 7.350 - 7.450   pCO2 arterial 41 32.0 - 48.0 mmHg   pO2, Arterial 97 83.0 - 108.0 mmHg   Bicarbonate 33.5 (H) 21.0 - 28.0 mEq/L   Acid-Base Excess 9.7 (H) 0.0 - 3.0 mmol/L   O2 Saturation 98.2 %   Patient temperature 37.0    Collection site RIGHT RADIAL    Sample type ARTERIAL DRAW    Allens test (pass/fail) PASS PASS  CBC with Differential     Status: Abnormal   Collection Time: 04/11/16  4:43 AM  Result Value Ref Range   WBC 10.4 3.6 - 11.0 K/uL   RBC 4.89 3.80 - 5.20 MIL/uL   Hemoglobin 13.6 12.0 - 16.0 g/dL   HCT  41.1 35.0 - 47.0 %   MCV 84.0 80.0 - 100.0 fL   MCH 27.9 26.0 - 34.0 pg   MCHC 33.2 32.0 - 36.0 g/dL   RDW 16.9 (H) 11.5 - 14.5 %   Platelets 322 150 - 440 K/uL   Neutrophils Relative % 67 %   Neutro Abs 7.0 (H) 1.4 - 6.5 K/uL   Lymphocytes Relative 23 %   Lymphs Abs 2.4 1.0 - 3.6 K/uL   Monocytes Relative 9 %   Monocytes Absolute 0.9 0.2 - 0.9 K/uL   Eosinophils Relative 1 %   Eosinophils Absolute 0.1 0 - 0.7 K/uL   Basophils Relative 0 %   Basophils Absolute 0.0 0 - 0.1 K/uL  Comprehensive metabolic panel     Status: Abnormal   Collection Time: 04/11/16  4:43 AM  Result Value Ref Range   Sodium 139 135 - 145 mmol/L   Potassium 2.9 (LL) 3.5 - 5.1 mmol/L    Comment: CRITICAL RESULT CALLED TO, READ BACK BY AND VERIFIED WITH IRIS GUIDRY AT 7322 ON 04/11/16....[place]    Chloride 97 (L) 101 - 111 mmol/L   CO2 31 22 - 32 mmol/L   Glucose, Bld 104 (H) 65 - 99 mg/dL   BUN 12 6 - 20 mg/dL   Creatinine, Ser 0.84 0.44 - 1.00 mg/dL   Calcium 9.7 8.9 - 10.3 mg/dL   Total Protein 8.8 (H) 6.5 - 8.1 g/dL   Albumin 5.1 (H) 3.5 - 5.0 g/dL   AST 16 15 - 41 U/L   ALT 16 14 - 54 U/L   Alkaline Phosphatase 77 38 - 126 U/L   Total Bilirubin 0.9 0.3 - 1.2 mg/dL   GFR calc non Af Amer >60 >60 mL/min   GFR calc Af Amer >60 >60 mL/min    Comment: (NOTE) The eGFR has been calculated using the CKD EPI equation. This calculation has not been validated in all clinical situations. eGFR's persistently <60 mL/min signify possible Chronic Kidney Disease.    Anion gap 11 5 - 15  Ethanol     Status: None   Collection Time: 04/11/16  4:43 AM  Result Value Ref Range   Alcohol, Ethyl (B) <5 <5 mg/dL    Comment:        LOWEST DETECTABLE LIMIT FOR SERUM ALCOHOL IS 5 mg/dL FOR MEDICAL PURPOSES ONLY   Acetaminophen level     Status: Abnormal   Collection Time: 04/11/16  4:43  AM  Result Value Ref Range   Acetaminophen (Tylenol), Serum <10 (L) 10 - 30 ug/mL    Comment:        THERAPEUTIC CONCENTRATIONS  VARY SIGNIFICANTLY. A RANGE OF 10-30 ug/mL MAY BE AN EFFECTIVE CONCENTRATION FOR MANY PATIENTS. HOWEVER, SOME ARE BEST TREATED AT CONCENTRATIONS OUTSIDE THIS RANGE. ACETAMINOPHEN CONCENTRATIONS >150 ug/mL AT 4 HOURS AFTER INGESTION AND >50 ug/mL AT 12 HOURS AFTER INGESTION ARE OFTEN ASSOCIATED WITH TOXIC REACTIONS.   Salicylate level     Status: None   Collection Time: 04/11/16  4:43 AM  Result Value Ref Range   Salicylate Lvl <1.0 2.8 - 30.0 mg/dL    Current Facility-Administered Medications  Medication Dose Route Frequency Provider Last Rate Last Dose  . dextrose 5 % and 0.45 % NaCl with KCl 40 mEq/L infusion   Intravenous Continuous Paulette Blanch, MD 125 mL/hr at 04/11/16 1021    . haloperidol lactate (HALDOL) injection 5 mg  5 mg Intravenous Once Paulette Blanch, MD   5 mg at 04/11/16 2585   Current Outpatient Prescriptions  Medication Sig Dispense Refill  . alprazolam (XANAX) 2 MG tablet Take 2 mg by mouth 2 (two) times daily as needed for anxiety.     . citalopram (CELEXA) 40 MG tablet Take 40 mg by mouth at bedtime.    Marland Kitchen QUEtiapine (SEROQUEL) 50 MG tablet Take 100 mg by mouth at bedtime.    . sucralfate (CARAFATE) 1 g tablet Take 1 tablet (1 g total) by mouth 4 (four) times daily -  with meals and at bedtime. 30 tablet 1  . zolpidem (AMBIEN) 10 MG tablet Take 10 mg by mouth at bedtime.      Musculoskeletal: Strength & Muscle Tone: decreased Gait & Station: unable to stand Patient leans: N/A  Psychiatric Specialty Exam: Physical Exam  Nursing note and vitals reviewed. Constitutional: She appears well-developed and well-nourished.  HENT:  Head: Normocephalic and atraumatic.  Eyes: Conjunctivae are normal. Pupils are equal, round, and reactive to light.  Neck: Normal range of motion.  Cardiovascular: Regular rhythm and normal heart sounds.   Respiratory: Effort normal. No respiratory distress.  GI: Soft.  Musculoskeletal: Normal range of motion.  Neurological: She is  alert.  Skin: Skin is warm and dry.  Psychiatric: Her affect is blunt. Her speech is delayed. She is slowed. She expresses impulsivity. She expresses no suicidal ideation. She exhibits abnormal recent memory.    Review of Systems  Constitutional: Positive for malaise/fatigue.  HENT: Negative.   Eyes: Negative.   Respiratory: Negative.   Cardiovascular: Negative.   Gastrointestinal: Positive for abdominal pain.  Musculoskeletal: Negative.   Skin: Negative.   Neurological: Negative.   Psychiatric/Behavioral: Negative for depression, suicidal ideas, hallucinations, memory loss and substance abuse. The patient is nervous/anxious and has insomnia.     Blood pressure 199/114, pulse 81, temperature 97.9 F (36.6 C), temperature source Oral, resp. rate 16, height 5' 2"  (1.575 m), weight 70.308 kg (155 lb), last menstrual period 02/21/2016, SpO2 100 %.Body mass index is 28.34 kg/(m^2).  General Appearance: Casual  Eye Contact:  None  Speech:  Slow  Volume:  Decreased  Mood:  Euthymic  Affect:  Constricted  Thought Process:  Goal Directed  Orientation:  Full (Time, Place, and Person)  Thought Content:  Logical  Suicidal Thoughts:  No  Homicidal Thoughts:  No  Memory:  Immediate;   Good Recent;   Fair Remote;   Fair  Judgement:  Impaired  Insight:  Shallow  Psychomotor Activity:  Decreased  Concentration:  Concentration: Fair  Recall:  [employer] of Knowledge:  Fair  Language:  Fair  Akathisia:  No  Handed:  Right  AIMS (if indicated):     Assets:  Desire for Improvement Intimacy Physical Health  ADL's:  Intact  Cognition:  WNL  Sleep:        Treatment Plan Summary: Plan 40 year old woman came in after overdosing on her psychiatric medicines. Patient has consistently denied any suicidal ideation. Reports that her mood has been good. Denies any psychotic symptoms. Currently following up with her outpatient physician for mental health treatment. Patient has had prior  presentations with accidental overdoses as well when she gets impulsive and wants to sleep. I don't think she meets commitment criteria or is likely to benefit from inpatient treatment. Patient was counseled about the importance of being scrupulous with only taking her medicines as prescribed. She will be discharged once the medical issues are clear.  Disposition: Patient does not meet criteria for psychiatric inpatient admission. Supportive therapy provided about ongoing stressors.  Alethia Berthold, MD 04/11/2016 2:36 PM

## 2016-04-11 NOTE — ED Provider Notes (Signed)
[facility] Emergency Department Provider Note   ____________________________________________  Time seen: Approximately 4:31 AM  I have reviewed the triage vital signs and the nursing notes.   HISTORY  Chief Complaint Drug Overdose    HPI Laurie Frederick is a 40 y.o. female who presents to the ED from home via EMS with a chief complaint of intentional overdose. Patient has a history of chronic abdominal pain, anxiety and depression who is well-known to the ED for narcotic seeking behavior. States she was seen 2 days ago for abdominal pain, nausea and vomiting. She was dissatisfied with her visits and at not having received narcotic pain medications. Since midnight patient reports she took six 100 mg Seroquel tablets and six 20 mg citalopram tablets "to make the pain go away so I could sleep".Also states she has vomited more than 20 times within the past 24 hours. Denies associated fever, chills, chest pain, shortness of breath, diarrhea, dysuria. States she has an appointment with the pain clinic next week but "could not wait". Denies recent travel or trauma. Nothing makes her symptoms better or worse.   Past Medical History  Diagnosis Date  . Sleep apnea   . GERD (gastroesophageal reflux disease)   . Hyperlipidemia   . Depression   . Anxiety   . PTSD (post-traumatic stress disorder)   . Obesity   . Anemia   . Vitamin D deficiency   . Nontoxic uninodular goiter   . Multiple gastric ulcers   . H pylori ulcer     Patient Active Problem List   Diagnosis Date Noted  . Abdominal pain   . Anxiety   . Uncontrollable vomiting   . UTI (lower urinary tract infection) 03/13/2016  . Abdominal pain, epigastric   . Gastritis   . Hypokalemia 08/16/2015  . Chronic prescription benzodiazepine use 08/16/2015  . Intractable nausea and vomiting 07/22/2015  . Epigastric pain   . Helicobacter positive gastritis 07/16/2015  . Gastritis and gastroduodenitis  07/16/2015  . Hyperlipidemia 07/13/2015  . Vitamin D deficiency 07/13/2015  . Thyroid nodule 07/13/2015  . Polyp of gallbladder 07/13/2015  . Liver nodule 07/13/2015  . Nausea & vomiting 07/13/2015  . Gastroenteritis 07/06/2015  . 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 Alcohol abuse 04/01/2015    Past Surgical History  Procedure Laterality Date  . Cesarean section    . Esophagogastroduodenoscopy (egd) with propofol Left 07/07/2015    Procedure: ESOPHAGOGASTRODUODENOSCOPY (EGD) WITH PROPOFOL;  Surgeon: Scot Jun, MD;  Location: [facility] ENDOSCOPY;  Service: Endoscopy;  Laterality: Left;  . Cesarean section    . Esophagogastroduodenoscopy (egd) with propofol N/A 09/05/2015    Procedure: ESOPHAGOGASTRODUODENOSCOPY (EGD) WITH PROPOFOL;  Surgeon: Midge Minium, MD;  Location: [facility] ENDOSCOPY;  Service: Endoscopy;  Laterality: N/A;  . Dilation and curettage of uterus      x 2 TAB  . Esophagogastroduodenoscopy (egd) with propofol N/A 03/15/2016    Procedure: ESOPHAGOGASTRODUODENOSCOPY (EGD) WITH PROPOFOL;  Surgeon: Midge Minium, MD;  Location: [facility] ENDOSCOPY;  Service: Endoscopy;  Laterality: N/A;    Current Outpatient Rx  Name  Route  Sig  Dispense  Refill  . alprazolam (XANAX) 2 MG tablet   Oral   Take 2 mg by mouth 2 (two) times daily as needed for anxiety.          . citalopram (CELEXA) 40 MG tablet   Oral   Take 40 mg by mouth at bedtime.         Marland Kitchen QUEtiapine (  SEROQUEL) 50 MG tablet   Oral   Take 100 mg by mouth at bedtime.         . sucralfate (CARAFATE) 1 g tablet   Oral   Take 1 tablet (1 g total) by mouth 4 (four) times daily -  with meals and at bedtime.   30 tablet   1   . zolpidem (AMBIEN) 10 MG tablet   Oral   Take 10 mg by mouth at bedtime.           Allergies Review of patient's allergies indicates no known allergies.  Family History  Problem Relation Age of Onset  . Lung cancer Father   . Cancer Father     lung cancer  . Alcohol abuse  Father   . Lung cancer Paternal Aunt   . Cancer Paternal Aunt     lung  . Bone cancer Paternal Uncle   . Cancer Paternal Uncle     pancreatic  . Depression Mother   . Anxiety disorder Mother   . Arthritis Mother   . Hyperlipidemia Mother   . Hypertension Maternal Grandmother   . Diabetes Paternal Grandmother   . Heart disease Paternal Grandmother   . Hypertension Paternal Grandmother   . Asthma Son   . Vision loss Neg Hx   . COPD Neg Hx     Social History Social History  Substance Use Topics  . Smoking status: Current Some Day Smoker -- 1.00 packs/day for 17 years    Types: Cigarettes  . Smokeless tobacco: Never Used  . Alcohol Use: No     Comment: occasional    Review of Systems  Constitutional: No fever/chills. Eyes: No visual changes. ENT: No sore throat. Cardiovascular: Denies chest pain. Respiratory: Denies shortness of breath. Gastrointestinal: Positive for abdominal pain, nausea and vomiting.  No diarrhea.  No constipation. Genitourinary: Negative for dysuria. Musculoskeletal: Negative for back pain. Skin: Negative for rash. Neurological: Negative for headaches, focal weakness or numbness. Psychiatric:Positive for depression and intentional ingestion.  10-point ROS otherwise negative.  ____________________________________________   PHYSICAL EXAM:  VITAL SIGNS: ED Triage Vitals  Enc Vitals Group     BP 04/11/16 0421 164/99 mmHg     Pulse Rate 04/11/16 0421 74     Resp 04/11/16 0421 18     Temp 04/11/16 0421 98.3 F (36.8 C)     Temp Source 04/11/16 0421 Oral     SpO2 04/11/16 0421 100 %     Weight 04/11/16 0419 165 lb (74.844 kg)     Height 04/11/16 0419  (1.575 m)     Head Cir --      Peak Flow --      Pain Score 04/11/16 0419 10     Pain Loc --      Pain Edu? --      Excl. in GC? --     Constitutional: Alert and oriented. Well appearing and in mild acute distress. Tearful. Whining and moaning. Eyes: Conjunctivae are normal. PERRL.  EOMI. Head: Atraumatic. Nose: No congestion/rhinnorhea. Mouth/Throat: Mucous membranes are moist.  Oropharynx non-erythematous. Neck: No stridor.   Cardiovascular: Normal rate, regular rhythm. Grossly normal heart sounds.  Good peripheral circulation. Respiratory: Normal respiratory effort.  No retractions. Lungs CTAB. Gastrointestinal: Soft and minimally tender to palpation epigastrium without rebound or guarding. No distention. No abdominal bruits. No CVA tenderness. Musculoskeletal: No lower extremity tenderness nor edema.  No joint effusions. Neurologic:  Normal speech and language. No gross focal neurologic deficits  are appreciated. No gait instability. Skin:  Skin is warm, dry and intact. No rash noted. Psychiatric: Mood and affect are tearful. Speech and behavior are flat.  ____________________________________________   LABS (all labs ordered are listed, but only abnormal results are displayed)  Labs Reviewed  CBC WITH DIFFERENTIAL/PLATELET - Abnormal; Notable for the following:    RDW 16.9 (*)    Neutro Abs 7.0 (*)    All other components within normal limits  COMPREHENSIVE METABOLIC PANEL - Abnormal; Notable for the following:    Potassium 2.9 (*)    Chloride 97 (*)    Glucose, Bld 104 (*)    Total Protein 8.8 (*)    Albumin 5.1 (*)    All other components within normal limits  ACETAMINOPHEN LEVEL - Abnormal; Notable for the following:    Acetaminophen (Tylenol), Serum <10 (*)    All other components within normal limits  BLOOD GAS, ARTERIAL - Abnormal; Notable for the following:    pH, Arterial 7.52 (*)    Bicarbonate 33.5 (*)    Acid-Base Excess 9.7 (*)    All other components within normal limits  ETHANOL  SALICYLATE LEVEL  URINALYSIS COMPLETEWITH MICROSCOPIC ([facility] ONLY)  URINE DRUG SCREEN, QUALITATIVE ([facility] ONLY)  POC URINE PREG, ED   ____________________________________________  EKG  ED ECG REPORT I, Grettel Rames J, the attending physician, personally  viewed and interpreted this ECG.   Date: 04/11/2016  EKG Time: 0439  Rate: 90  Rhythm: normal EKG, normal sinus rhythm  Axis: Normal  Intervals:none  ST&T Change: Nonspecific  ____________________________________________  RADIOLOGY  None ____________________________________________   PROCEDURES  Procedure(s) performed: None  Critical Care performed: No  ____________________________________________   INITIAL IMPRESSION / ASSESSMENT AND PLAN / ED COURSE  Pertinent labs & imaging results that were available during my care of the patient were reviewed by me and considered in my medical decision making (see chart for details).  40 year old female who presents with intentional overdose. She has chronic abdominal pain, nausea and vomiting. Currently she is tearful with moist tears, abdominal exam is benign without peritonitis. Review of chart demonstrates patient has had multiple workups for abdominal pain including numerous CT scans as well as recent GI consultation and unremarkable EGD. For this reason, I will hold off on imaging studies. Poison control contacted by nursing who recommended supportive care and 24 hour period of observation for toxic side effects due to Celexa. Patient is histrionic and demonstrating poor judgments by her actions; will place patient under involuntary commitment and consult TTS as well as psychiatry to evaluate patient in the emergency department. Will initiate IV fluid resuscitation and administer Haldol for refractory nausea. Patient is actively trying to make herself vomit which is resulting in dry heaving but no active emesis.  ----------------------------------------- 6:53 AM on 04/11/2016 -----------------------------------------  Patient appears to be sleeping with eyes closed but immediately begins to moan upon staff member entering her treatment room. She will remain under involuntary commitment pending psychiatry consult this morning. She is  to be monitored for 24 hours for medical clearance. ____________________________________________   FINAL CLINICAL IMPRESSION(S) / ED DIAGNOSES  Final diagnoses:  Overdose, intentional self-harm, initial encounter (HCC)  Chronic abdominal pain  Moderate major depression (HCC)  Non-intractable vomiting with nausea, vomiting of unspecified type  Hypokalemia      NEW MEDICATIONS STARTED DURING THIS VISIT:  New Prescriptions   No medications on file     Note:  This document was prepared using Dragon voice recognition software and may  include unintentional dictation errors.    Irean HongJade J Flower Franko, MD 04/11/16 [phone]

## 2016-04-11 NOTE — ED Notes (Signed)
Pt was given supper tray and stated she was hurting to bad to eat right now. Pt ambulated to the bathroom.  RN informed of pain.
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
## 2016-04-11 NOTE — ED Provider Notes (Signed)
Patient able to spontaneously void. Repeat EKG performed at 1748.  Interpreted by me Sinus rhythm rate of 71, normal axis and intervals. Normal QRS ST segments and T waves. No acute ischemic changes, no evidence of toxicologic syndrome or conduction delay.  Sharman CheekPhillip Skyelynn Rambeau, MD 04/11/16 1755

## 2016-04-11 NOTE — ED Notes (Signed)
BEHAVIORAL HEALTH ROUNDING Patient sleeping: No. Patient alert and oriented: yes Behavior appropriate: Yes.  ; If no, describe:  Nutrition and fluids offered: Yes  Toileting and hygiene offered: Yes  Sitter present: q 15 minute checks, door open Law enforcement present: Yes

## 2016-04-11 NOTE — ED Notes (Signed)
Brought in via ems for abd pain for the last year worse for 2 days - pt wanted the "pain to go away" so she reports that she took 6-100mg  seroquel and 6-20mg  citalopram - hx of depression and anxiety - pt is alert and oriented X4

## 2016-04-11 NOTE — ED Notes (Signed)
Pt sleeping. 

## 2016-04-12 MED ORDER — TRAMADOL HCL 50 MG PO TABS
50.0000 mg | ORAL_TABLET | Freq: Once | ORAL | Status: AC
  Administered 2016-04-12: 50 mg via ORAL
  Filled 2016-04-12: qty 1

## 2016-04-12 MED ORDER — IBUPROFEN 800 MG PO TABS
800.0000 mg | ORAL_TABLET | Freq: Once | ORAL | Status: AC
  Administered 2016-04-12: 800 mg via ORAL
  Filled 2016-04-12: qty 1

## 2016-04-12 MED ORDER — LORAZEPAM 2 MG PO TABS
2.0000 mg | ORAL_TABLET | Freq: Once | ORAL | Status: AC
  Administered 2016-04-12: 2 mg via ORAL

## 2016-04-12 MED ORDER — LORAZEPAM 2 MG PO TABS
ORAL_TABLET | ORAL | Status: AC
  Administered 2016-04-12: 2 mg via ORAL
  Filled 2016-04-12: qty 1

## 2016-04-12 MED ORDER — PHENAZOPYRIDINE HCL 100 MG PO TABS
95.0000 mg | ORAL_TABLET | Freq: Once | ORAL | Status: AC
  Administered 2016-04-12: 100 mg via ORAL
  Filled 2016-04-12: qty 1

## 2016-04-12 MED ORDER — DIPHENHYDRAMINE HCL 25 MG PO CAPS
50.0000 mg | ORAL_CAPSULE | Freq: Once | ORAL | Status: AC
  Administered 2016-04-12: 25 mg via ORAL
  Filled 2016-04-12: qty 2

## 2016-04-12 MED ORDER — ONDANSETRON HCL 4 MG PO TABS
4.0000 mg | ORAL_TABLET | Freq: Once | ORAL | Status: AC
  Administered 2016-04-12: 4 mg via ORAL
  Filled 2016-04-12: qty 1

## 2016-04-12 NOTE — ED Notes (Signed)
Patient denies SI/HI/AVH and pain. All belongings returned to patient (sweatpants, white t shirt, pair of underwear and pair of flipflops). Patient discharged home with mother. Discharge instructions reviewed.

## 2016-04-12 NOTE — ED Notes (Signed)
Patient currently denies SI/HI/AVH and endorses pain rated a 10/10 in her left abdomen. Patient recently received tramadol to address this symptom.  Denies depression and anxiety at this time although patient does present with a flat affect.  Patient states that she would like to go home and get to her doctor's appointment this morning. Patient is calm and cooperative, no signs of acute distress noted at this time. Maintained on 15 minute checks and observation by security camera for safety.

## 2016-04-12 NOTE — ED Provider Notes (Signed)
-----------------------------------------   8:12 AM on 04/12/2016 -----------------------------------------   Blood pressure 170/113, pulse 73, temperature 98.5 F (36.9 C), temperature source Oral, resp. rate 18, height 5\' 2"  (1.575 m), weight 155 lb (70.308 kg), last menstrual period 02/21/2016, SpO2 100 %.  The patient had no acute events since last update.  Calm and cooperative at this time.    The patient did receive some Benadryl overnight as well as some Tylenol, and appropriate then and tramadol for her abdominal pain. She also received 2 mg of Ativan to help her sleep. The patient has been cleared by psych and she has been 24 hours without any arrhythmias in the emergency department. She will be discharged home to follow-up closely with psychiatry. Dr Derrill Kaygoodman will evaluate the patient's ekg     Laurie ApleyAllison P Glena Pharris, MD 04/12/16 [phone]

## 2016-04-12 NOTE — ED Notes (Signed)
Patient assigned to appropriate care area. Patient oriented to unit/care area: Informed that, for their safety, care areas are designed for safety and monitored by security cameras at all times; and visiting hours explained to patient. Patient verbalizes understanding, and verbal contract for safety obtained.  ED BHU PLACEMENT JUSTIFICATION Is the patient under IVC or is there intent for IVC: Yes.   Is the patient medically cleared: Yes.   Is there vacancy in the ED BHU: Yes.   Is the population mix appropriate for patient: Yes.   Is the patient awaiting placement in inpatient or outpatient setting: No. Has the patient had a psychiatric consult: Yes.   Survey of unit performed for contraband, proper placement and condition of furniture, tampering with fixtures in bathroom, shower, and each patient room: Yes.  ; Findings: all clear APPEARANCE/BEHAVIOR uncooperative and hesitant to come to BHU, pt kept stating "I don't wanna go back to that place" pt unable to say what place pt's is referring NEURO ASSESSMENT Orientation: time, place and person Hallucinations: No.None noted (Hallucinations) Speech: Rate:slow and plaintive Gait: normal RESPIRATORY ASSESSMENT Normal expansion.  Clear to auscultation.  No rales, rhonchi, or wheezing. CARDIOVASCULAR ASSESSMENT regular rate and rhythm, S1, S2 normal, no murmur, click, rub or gallop GASTROINTESTINAL ASSESSMENT tender and guarding RUQ EXTREMITIES normal strength, tone, and muscle mass, no deformities, no erythema, induration, or nodules, no evidence of joint effusion, ROM of all joints is normal, no evidence of joint instability, gait was normal for age, no musculoskeletal defects noted, joint mobility appears intact PLAN OF CARE Provide calm/safe environment. Vital signs assessed twice daily. ED BHU Assessment once each 12-hour shift. Collaborate with intake RN daily or as condition indicates. Assure the ED provider has rounded once each shift.  Provide and encourage hygiene. Provide redirection as needed. Assess for escalating behavior; address immediately and inform ED provider.  Assess family dynamic and appropriateness for visitation as needed: Yes ; If necessary, describe findings: NA Educate the patient/family about BHU procedures/visitation: Yes.  ; If necessary, describe findings: NA

## 2016-04-12 NOTE — ED Notes (Signed)
Pt given extra blanket

## 2016-04-12 NOTE — ED Notes (Signed)
Patient vomited x2 while in the room. Patient states that she normally throws up during her menstrual period. Patient asked to receive nausea medication before she is discharged, currently awaiting physician orders. Patient remains calm and cooperative. Maintained on 15 minute checks and observation by security camera for safety.

## 2016-04-12 NOTE — ED Notes (Signed)
Pt asking for medication for her abd pain. MD aware. Orders received.

## 2016-04-12 NOTE — ED Provider Notes (Signed)
Lurline IdolI, Geoffry Bannister, attending physician, personally viewed and interpreted this EKG  EKG Time: 0851 Rate: 80 Rhythm: normal sinus rhythm Axis: normal Intervals: qtc 452 QRS: narrow, LVH ST changes: no st elevation Impression: LVH   Phineas SemenGraydon Saryiah Bencosme, MD 04/12/16 [phone]

## 2016-04-12 NOTE — ED Notes (Signed)
ENVIRONMENTAL ASSESSMENT Potentially harmful objects out of patient reach: Yes Personal belongings secured: Yes Patient dressed in hospital provided attire only: Yes Plastic bags out of patient reach: Yes Patient care equipment (cords, cables, call bells, lines, and drains) shortened, removed, or accounted for: Yes Equipment and supplies removed from bottom of stretcher: Yes Potentially toxic materials out of patient reach: Yes Sharps container removed or out of patient reach: Yes  Patient is currently in room resting. No signs of acute distress noted. Maintained on 15 minute checks and observation by security camera for safety.  

## 2016-04-13 LAB — URINE CULTURE

## 2016-04-16 ENCOUNTER — Emergency Department
Admission: EM | Admit: 2016-04-16 | Discharge: 2016-04-16 | Disposition: A | Discharge status: Home / Self Care | Payer: Medicaid Other | Attending: Emergency Medicine | Admitting: Emergency Medicine

## 2016-04-16 ENCOUNTER — Other Ambulatory Visit: Payer: Self-pay

## 2016-04-16 ENCOUNTER — Emergency Department: Payer: Medicaid Other

## 2016-04-16 DIAGNOSIS — R1115 Cyclical vomiting syndrome unrelated to migraine: Secondary | ICD-10-CM

## 2016-04-16 DIAGNOSIS — F329 Major depressive disorder, single episode, unspecified: Secondary | ICD-10-CM | POA: Insufficient documentation

## 2016-04-16 DIAGNOSIS — E785 Hyperlipidemia, unspecified: Secondary | ICD-10-CM | POA: Diagnosis not present

## 2016-04-16 DIAGNOSIS — F17210 Nicotine dependence, cigarettes, uncomplicated: Secondary | ICD-10-CM | POA: Diagnosis not present

## 2016-04-16 DIAGNOSIS — G43A0 Cyclical vomiting, not intractable: Secondary | ICD-10-CM | POA: Diagnosis not present

## 2016-04-16 DIAGNOSIS — E876 Hypokalemia: Secondary | ICD-10-CM | POA: Insufficient documentation

## 2016-04-16 DIAGNOSIS — R112 Nausea with vomiting, unspecified: Secondary | ICD-10-CM | POA: Diagnosis present

## 2016-04-16 LAB — HEPATIC FUNCTION PANEL
ALK PHOS: 67 U/L (ref 38–126)
ALT: 20 U/L (ref 14–54)
AST: 18 U/L (ref 15–41)
Albumin: 4.8 g/dL (ref 3.5–5.0)
BILIRUBIN TOTAL: 0.4 mg/dL (ref 0.3–1.2)
TOTAL PROTEIN: 7.9 g/dL (ref 6.5–8.1)

## 2016-04-16 LAB — BASIC METABOLIC PANEL
Anion gap: 10 (ref 5–15)
BUN: 9 mg/dL (ref 6–20)
CHLORIDE: 101 mmol/L (ref 101–111)
CO2: 29 mmol/L (ref 22–32)
CREATININE: 0.57 mg/dL (ref 0.44–1.00)
Calcium: 10 mg/dL (ref 8.9–10.3)
Glucose, Bld: 103 mg/dL — ABNORMAL HIGH (ref 65–99)
POTASSIUM: 3 mmol/L — AB (ref 3.5–5.1)
SODIUM: 140 mmol/L (ref 135–145)

## 2016-04-16 LAB — PREGNANCY, URINE: Preg Test, Ur: NEGATIVE

## 2016-04-16 LAB — CBC
HCT: 37.6 % (ref 35.0–47.0)
Hemoglobin: 12.2 g/dL (ref 12.0–16.0)
MCH: 27.6 pg (ref 26.0–34.0)
MCHC: 32.4 g/dL (ref 32.0–36.0)
MCV: 85.3 fL (ref 80.0–100.0)
PLATELETS: 325 10*3/uL (ref 150–440)
RBC: 4.41 MIL/uL (ref 3.80–5.20)
RDW: 16.3 % — AB (ref 11.5–14.5)
WBC: 11.3 10*3/uL — ABNORMAL HIGH (ref 3.6–11.0)

## 2016-04-16 LAB — LIPASE, BLOOD: Lipase: 30 U/L (ref 11–51)

## 2016-04-16 LAB — TROPONIN I

## 2016-04-16 MED ORDER — GI COCKTAIL ~~LOC~~
30.0000 mL | Freq: Once | ORAL | Status: DC
  Filled 2016-04-16: qty 30

## 2016-04-16 MED ORDER — POTASSIUM CHLORIDE ER 10 MEQ PO TBCR: 10.0000 meq | EXTENDED_RELEASE_TABLET | Freq: Every day | ORAL | Status: DC

## 2016-04-16 MED ORDER — ACETAMINOPHEN 500 MG PO TABS
1000.0000 mg | ORAL_TABLET | ORAL | Status: AC
  Administered 2016-04-16: 1000 mg via ORAL
  Filled 2016-04-16: qty 2

## 2016-04-16 MED ORDER — KETOROLAC TROMETHAMINE 30 MG/ML IJ SOLN
15.0000 mg | Freq: Once | INTRAMUSCULAR | Status: AC
  Administered 2016-04-16: 15 mg via INTRAVENOUS
  Filled 2016-04-16: qty 1

## 2016-04-16 MED ORDER — METOCLOPRAMIDE HCL 10 MG PO TABS: 10.0000 mg | ORAL_TABLET | Freq: Three times a day (TID) | ORAL | Status: DC | PRN

## 2016-04-16 MED ORDER — ONDANSETRON 4 MG PO TBDP: 4.0000 mg | ORAL_TABLET | Freq: Four times a day (QID) | ORAL | Status: DC | PRN

## 2016-04-16 MED ORDER — ONDANSETRON HCL 4 MG/2ML IJ SOLN
4.0000 mg | Freq: Once | INTRAMUSCULAR | Status: AC
  Administered 2016-04-16: 4 mg via INTRAVENOUS
  Filled 2016-04-16: qty 2

## 2016-04-16 MED ORDER — POTASSIUM CHLORIDE CRYS ER 20 MEQ PO TBCR
40.0000 meq | EXTENDED_RELEASE_TABLET | Freq: Once | ORAL | Status: AC
  Administered 2016-04-16: 40 meq via ORAL
  Filled 2016-04-16: qty 2

## 2016-04-16 MED ORDER — SODIUM CHLORIDE 0.9 % IV BOLUS (SEPSIS)
1000.0000 mL | Freq: Once | INTRAVENOUS | Status: AC
  Administered 2016-04-16: 1000 mL via INTRAVENOUS

## 2016-04-16 NOTE — ED Notes (Signed)
Patient transported to X-ray 

## 2016-04-16 NOTE — ED Notes (Signed)
PT continues to request ativan and haldol by name. Pt verbalized she has not been able to sleep and "since I am waiting can I have some to help me rest" Pt states " they have given me haldol in the past why can they not give it to me now" Pt is tearful but in no other distress. Vitals are WNL.

## 2016-04-16 NOTE — ED Notes (Signed)
Pt. Verbalizes understanding of d/c instructions, prescriptions, and follow-up. VS stable.  Pt. In NAD at time of d/c and denies concerns. Pt. Wheeled out of the unit by [employer].

## 2016-04-16 NOTE — ED Notes (Signed)
Pt reports eating large meal last night and has been vomiting since, pt with hx of chronic vomiting. Pt also reports left sided chest pain today and "abnormal beating". Pt also reports left sided sharp pain.

## 2016-04-16 NOTE — ED Provider Notes (Signed)
[facility] Emergency Department Provider Note  ____________________________________________  Time seen: Approximately 2:12 PM  I have reviewed the triage vital signs and the nursing notes.   HISTORY  Chief Complaint Chest Pain and Emesis    HPI Laurie Frederick is a 40 y.o. female presents for evaluation of vomiting.  The patient reports to me that for the last several months whenever she eats food, it stays down for a little while and then she gets nauseated and has to vomit. She experiences a pain in her left upper stomach area, and it resolves after throwing up. She reports the same started yesterday, she ate a meal and then vomited up. She reports that she's had a burning sensation in her throat and chest since that time.  She reports she continues to have a burning sensation across her chest. She feels like "acid."  She is not having any shortness of breath or cough.  She denies pregnancy. She tells me that she's had these symptoms many times before, she seen a gastroenterologist and just a few months ago had an endoscopy and was told was normal. She tells me that the gastroenterologist told it was nothing further they can do to figure out what the cause was.  She denies any radiating or heavy chest pressure or pain.  Patient tells me that she cannot stop vomiting. It is noted however that she has not vomited once in the emergency room. She denies any black or bloody vomit. No diarrhea. She does not feel constipated or bloated.   Past Medical History  Diagnosis Date  . Sleep apnea   . GERD (gastroesophageal reflux disease)   . Hyperlipidemia   . Depression   . Anxiety   . PTSD (post-traumatic stress disorder)   . Obesity   . Anemia   . Vitamin D deficiency   . Nontoxic uninodular goiter   . Multiple gastric ulcers   . H pylori ulcer     Patient Active Problem List   Diagnosis Date Noted  . Overdose 04/11/2016  . Abdominal pain   .  Anxiety   . Uncontrollable vomiting   . UTI (lower urinary tract infection) 03/13/2016  . Abdominal pain, epigastric   . Gastritis   . Hypokalemia 08/16/2015  . Chronic prescription benzodiazepine use 08/16/2015  . Intractable nausea and vomiting 07/22/2015  . Epigastric pain   . Helicobacter positive gastritis 07/16/2015  . Gastritis and gastroduodenitis 07/16/2015  . Hyperlipidemia 07/13/2015  . Vitamin D deficiency 07/13/2015  . Thyroid nodule 07/13/2015  . Polyp of gallbladder 07/13/2015  . Liver nodule 07/13/2015  . Nausea & vomiting 07/13/2015  . Gastroenteritis 07/06/2015  . 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 Alcohol abuse 04/01/2015    Past Surgical History  Procedure Laterality Date  . Cesarean section    . Esophagogastroduodenoscopy (egd) with propofol Left 07/07/2015    Procedure: ESOPHAGOGASTRODUODENOSCOPY (EGD) WITH PROPOFOL;  Surgeon: Scot Jun, MD;  Location: [facility] ENDOSCOPY;  Service: Endoscopy;  Laterality: Left;  . Cesarean section    . Esophagogastroduodenoscopy (egd) with propofol N/A 09/05/2015    Procedure: ESOPHAGOGASTRODUODENOSCOPY (EGD) WITH PROPOFOL;  Surgeon: Midge Minium, MD;  Location: [facility] ENDOSCOPY;  Service: Endoscopy;  Laterality: N/A;  . Dilation and curettage of uterus      x 2 TAB  . Esophagogastroduodenoscopy (egd) with propofol N/A 03/15/2016    Procedure: ESOPHAGOGASTRODUODENOSCOPY (EGD) WITH PROPOFOL;  Surgeon: Midge Minium, MD;  Location: [facility] ENDOSCOPY;  Service: Endoscopy;  Laterality: N/A;  Current Outpatient Rx  Name  Route  Sig  Dispense  Refill  . alprazolam (XANAX) 2 MG tablet   Oral   Take 2 mg by mouth 2 (two) times daily as needed for anxiety.          . citalopram (CELEXA) 40 MG tablet   Oral   Take 40 mg by mouth at bedtime.         . ondansetron (ZOFRAN ODT) 4 MG disintegrating tablet   Oral   Take 1 tablet (4 mg total) by mouth every 6 (six) hours as needed for nausea or vomiting.   20 tablet    0   . potassium chloride (K-DUR) 10 MEQ tablet   Oral   Take 1 tablet (10 mEq total) by mouth daily.   5 tablet   0   . QUEtiapine (SEROQUEL) 50 MG tablet   Oral   Take 100 mg by mouth at bedtime.         . sucralfate (CARAFATE) 1 g tablet   Oral   Take 1 tablet (1 g total) by mouth 4 (four) times daily -  with meals and at bedtime.   30 tablet   1   . zolpidem (AMBIEN) 10 MG tablet   Oral   Take 10 mg by mouth at bedtime.           Allergies Review of patient's allergies indicates no known allergies.  Family History  Problem Relation Age of Onset  . Lung cancer Father   . Cancer Father     lung cancer  . Alcohol abuse Father   . Lung cancer Paternal Aunt   . Cancer Paternal Aunt     lung  . Bone cancer Paternal Uncle   . Cancer Paternal Uncle     pancreatic  . Depression Mother   . Anxiety disorder Mother   . Arthritis Mother   . Hyperlipidemia Mother   . Hypertension Maternal Grandmother   . Diabetes Paternal Grandmother   . Heart disease Paternal Grandmother   . Hypertension Paternal Grandmother   . Asthma Son   . Vision loss Neg Hx   . COPD Neg Hx     Social History Social History  Substance Use Topics  . Smoking status: Current Some Day Smoker -- 1.00 packs/day for 17 years    Types: Cigarettes  . Smokeless tobacco: Never Used  . Alcohol Use: No     Comment: occasional    Review of Systems Constitutional: No fever/chills Eyes: No visual changes. ENT: No sore throat. Cardiovascular: Denies chest pain except for a burning feeling in her throat and chest since last night. Respiratory: Denies shortness of breath. Gastrointestinal: Pain in the left upper abdomen over her stomach.  No diarrhea.  No constipation. Genitourinary: Negative for dysuria. Musculoskeletal: Negative for back pain. Skin: Negative for rash. Neurological: Negative for headaches, focal weakness or numbness.  10-point ROS otherwise  negative.  ____________________________________________   PHYSICAL EXAM:  VITAL SIGNS: ED Triage Vitals  Enc Vitals Group     BP 04/16/16 1132 156/110 mmHg     Pulse Rate 04/16/16 1132 95     Resp 04/16/16 1132 16     Temp 04/16/16 1132 98.7 F (37.1 C)     Temp Source 04/16/16 1132 Oral     SpO2 04/16/16 1132 99 %     Weight 04/16/16 1132 165 lb (74.844 kg)     Height 04/16/16 1132 5\' 3"  (1.6 m)  Head Cir --      Peak Flow --      Pain Score 04/16/16 1132 6     Pain Loc --      Pain Edu? --      Excl. in GC? --    Constitutional: Alert and oriented. Well appearing and in no acute distress. Eyes: Conjunctivae are normal. PERRL. EOMI. Head: Atraumatic. Nose: No congestion/rhinnorhea. Mouth/Throat: Mucous membranes are moist.  Oropharynx non-erythematous. Neck: No stridor.   Cardiovascular: Normal rate, regular rhythm. Grossly normal heart sounds.  Good peripheral circulation. Respiratory: Normal respiratory effort.  No retractions. Lungs CTAB. Gastrointestinal: Soft and nontender. Negative Murphy. No distention. No CVA tenderness. Musculoskeletal: No lower extremity tenderness nor edema.  No joint effusions. Neurologic:  Normal speech and language. No gross focal neurologic deficits are appreciated. Skin:  Skin is warm, dry and intact. No rash noted. Psychiatric: Mood and affect are normal. Speech and behavior are normal.  ____________________________________________   LABS (all labs ordered are listed, but only abnormal results are displayed)  Labs Reviewed  BASIC METABOLIC PANEL - Abnormal; Notable for the following:    Potassium 3.0 (*)    Glucose, Bld 103 (*)    All other components within normal limits  CBC - Abnormal; Notable for the following:    WBC 11.3 (*)    RDW 16.3 (*)    All other components within normal limits  HEPATIC FUNCTION PANEL - Abnormal; Notable for the following:    Bilirubin, Direct <0.1 (*)    All other components within normal  limits  TROPONIN I  LIPASE, BLOOD  PREGNANCY, URINE   ____________________________________________  EKG  Reviewed injury by me at 1135 g Heart rate 95 120 QRS 80 QTc 440 Reviewed and interpreted as normal sinus rhythm, minimal nonspecific T-wave abnormality noted in lateral per corneal leads. No evidence of acute ischemic abnormality. ____________________________________________  RADIOLOGY  DG Abd 2 Views (Final result) Result time: 04/16/16 14:31:52   Final result by Rad Results In Interface (04/16/16 14:31:52)   Narrative:   CLINICAL DATA: Pt reports eating large meal last night and has been vomiting since, pt with hx of chronic vomiting. Pt also reports left sided chest pain today and "abnormal beating". Pt also reports left sided sharp pain.  EXAM: ABDOMEN - 2 VIEW  COMPARISON: 03/15/2016  FINDINGS: Normal bowel gas pattern.  Stable IUD in the pelvis. Soft tissues otherwise unremarkable.  Skeletal structures are unremarkable. Lung bases are clear.  IMPRESSION: Negative.   Electronically Signed By: Amie Portland M.D. On: 04/16/2016 14:31          DG Chest 2 View (Final result) Result time: 04/16/16 12:02:05   Final result by Rad Results In Interface (04/16/16 12:02:05)   Narrative:   CLINICAL DATA: Chest pain since last night.  EXAM: CHEST 2 VIEW  COMPARISON: 03/13/2016  FINDINGS: The heart size and mediastinal contours are within normal limits. Both lungs are clear. No pleural effusion or pneumothorax. The visualized skeletal structures are unremarkable.  IMPRESSION: No active cardiopulmonary disease.   Electronically Signed By: Amie Portland M.D. On: 04/16/2016 12:02       ____________________________________________   PROCEDURES  Procedure(s) performed: None  Critical Care performed: No  ____________________________________________   INITIAL IMPRESSION / ASSESSMENT AND PLAN / ED COURSE  Pertinent  labs & imaging results that were available during my care of the patient were reviewed by me and considered in my medical decision making (see chart for details).  Patient presents for evaluation  of recurrent episodes of nausea and vomiting. She gives me a history at this appears to be quite chronic in nature, she reports happens multiple times a month or she will suddenly vomited up the food she eats. Her physical exam is quite remarkably normal. She really seems to have no abdominal tenderness if I palpate while she is speaking to me. However, she does report some epigastric tenderness when I note to her that I am examining her. In addition, she reports that she has intractable vomiting, yet does not register vomited once in the emergency room. Her EKG is reassuring, her troponin is normal, and given her symptoms started yesterday with a cardiac ischemic cause it would be certainly abnormality notable by now.  Patient has a history of recurrent evaluations, she also has a history of a recent psychiatric visit and overdose. The patient was initially requesting Phenergan, however due to a national shortage can provide this. I did give her Zofran and fluids, she has not had any emesis here. Her labs quite reassuring, a very minimal leukocytosis but without any abdominal tenderness notable on my exam, I doubt any acute pathology. She certainly does not have any evidence of an acute abdomen, peritonitis or other cause to prompt need for CT imaging.  ----------------------------------------- 2:21 PM on 04/16/2016 -----------------------------------------  Patient is now requesting that I give her Haldol or Xanax for nausea, and that she wants to try to get some sleep in the emergency room. I discussed with the patient, and she refused GI cocktail. She is awake and alert, she denies any hallucinations, suicidal symptoms, or self harmful ideation. I am beginning to suspect that her gastrointestinal symptoms  given the recent workup by gastroenterology which was evidently negative may be related to psychiatric disease. There is no evidence of dehydration or acute abdominal emergency at this time. No evidence of acute cardiac disease. She has no pulmonary symptoms. She has normal oxygen saturation, normal respiratory rate clear lungs.     Plan to discharge the patient home, we'll provide prescription for Zofran and customary abdominal pain return precautions discussed. Return precautions and treatment recommendations and follow-up discussed with the patient who is agreeable with the plan.  ____________________________________________   FINAL CLINICAL IMPRESSION(S) / ED DIAGNOSES  Final diagnoses:  Non-intractable cyclical vomiting with nausea  Hypokalemia due to loss of potassium      Sharyn Creamer, MD 04/16/16 1444

## 2016-04-16 NOTE — Discharge Instructions (Signed)
You were seen in the emergency room for nausea and vomiting. It is important that you follow up closely with your primary care doctor in the next couple of days.  If you're unable to see her primary care doctor you may return to the emergency room or go to the [facility] walk-in clinic in 1 or 2 days for reexam.  Please return to the emergency room right away if you are to develop a fever, severe nausea, your pain becomes severe or worsens, you are unable to keep food down, begin vomiting any dark or bloody fluid, you develop any dark or bloody stools, feel dehydrated, or other new concerns or symptoms arise.

## 2016-05-04 ENCOUNTER — Emergency Department: Payer: Medicaid Other

## 2016-05-04 ENCOUNTER — Emergency Department
Admission: EM | Admit: 2016-05-04 | Discharge: 2016-05-05 | Disposition: A | Discharge status: Home / Self Care | Payer: Medicaid Other | Attending: Emergency Medicine | Admitting: Emergency Medicine

## 2016-05-04 ENCOUNTER — Encounter: Payer: Self-pay | Admitting: Emergency Medicine

## 2016-05-04 DIAGNOSIS — R319 Hematuria, unspecified: Secondary | ICD-10-CM | POA: Diagnosis not present

## 2016-05-04 DIAGNOSIS — E785 Hyperlipidemia, unspecified: Secondary | ICD-10-CM | POA: Insufficient documentation

## 2016-05-04 DIAGNOSIS — Z79899 Other long term (current) drug therapy: Secondary | ICD-10-CM | POA: Insufficient documentation

## 2016-05-04 DIAGNOSIS — G8929 Other chronic pain: Secondary | ICD-10-CM | POA: Diagnosis not present

## 2016-05-04 DIAGNOSIS — F17210 Nicotine dependence, cigarettes, uncomplicated: Secondary | ICD-10-CM | POA: Insufficient documentation

## 2016-05-04 DIAGNOSIS — R112 Nausea with vomiting, unspecified: Secondary | ICD-10-CM | POA: Insufficient documentation

## 2016-05-04 DIAGNOSIS — R109 Unspecified abdominal pain: Secondary | ICD-10-CM | POA: Insufficient documentation

## 2016-05-04 DIAGNOSIS — E876 Hypokalemia: Secondary | ICD-10-CM

## 2016-05-04 DIAGNOSIS — F329 Major depressive disorder, single episode, unspecified: Secondary | ICD-10-CM | POA: Diagnosis not present

## 2016-05-04 LAB — URINALYSIS COMPLETE WITH MICROSCOPIC (ARMC ONLY)
Bacteria, UA: NONE SEEN
Bilirubin Urine: NEGATIVE
Glucose, UA: NEGATIVE mg/dL
HGB URINE DIPSTICK: NEGATIVE
Leukocytes, UA: NEGATIVE
Nitrite: NEGATIVE
PH: 5 (ref 5.0–8.0)
PROTEIN: 100 mg/dL — AB
SPECIFIC GRAVITY, URINE: 1.032 — AB (ref 1.005–1.030)

## 2016-05-04 LAB — COMPREHENSIVE METABOLIC PANEL
ALBUMIN: 4.5 g/dL (ref 3.5–5.0)
ALK PHOS: 68 U/L (ref 38–126)
ALT: 17 U/L (ref 14–54)
AST: 29 U/L (ref 15–41)
Anion gap: 12 (ref 5–15)
BUN: 12 mg/dL (ref 6–20)
CALCIUM: 9.7 mg/dL (ref 8.9–10.3)
CHLORIDE: 103 mmol/L (ref 101–111)
CO2: 24 mmol/L (ref 22–32)
CREATININE: 0.68 mg/dL (ref 0.44–1.00)
GFR calc non Af Amer: >60 mL/min (ref >60)
GLUCOSE: 127 mg/dL — AB (ref 65–99)
Potassium: 3 mmol/L — ABNORMAL LOW (ref 3.5–5.1)
SODIUM: 139 mmol/L (ref 135–145)
Total Bilirubin: 0.3 mg/dL (ref 0.3–1.2)
Total Protein: 8 g/dL (ref 6.5–8.1)

## 2016-05-04 LAB — CBC
HCT: 35 % (ref 35.0–47.0)
Hemoglobin: 11.9 g/dL — ABNORMAL LOW (ref 12.0–16.0)
MCH: 28.8 pg (ref 26.0–34.0)
MCHC: 33.9 g/dL (ref 32.0–36.0)
MCV: 85.1 fL (ref 80.0–100.0)
PLATELETS: 338 10*3/uL (ref 150–440)
RBC: 4.12 MIL/uL (ref 3.80–5.20)
RDW: 16.6 % — AB (ref 11.5–14.5)
WBC: 12.9 10*3/uL — ABNORMAL HIGH (ref 3.6–11.0)

## 2016-05-04 LAB — POCT PREGNANCY, URINE: PREG TEST UR: NEGATIVE

## 2016-05-04 LAB — LIPASE, BLOOD: LIPASE: 18 U/L (ref 11–51)

## 2016-05-04 MED ORDER — PROMETHAZINE HCL 25 MG RE SUPP
25.0000 mg | Freq: Once | RECTAL | Status: AC
  Administered 2016-05-04: 25 mg via RECTAL

## 2016-05-04 MED ORDER — PROMETHAZINE HCL 25 MG RE SUPP
RECTAL | Status: AC
  Administered 2016-05-04: 25 mg via RECTAL
  Filled 2016-05-04: qty 1

## 2016-05-04 MED ORDER — HALOPERIDOL LACTATE 5 MG/ML IJ SOLN
2.5000 mg | Freq: Once | INTRAMUSCULAR | Status: AC
  Administered 2016-05-04: 2.5 mg via INTRAVENOUS
  Filled 2016-05-04: qty 1

## 2016-05-04 MED ORDER — SODIUM CHLORIDE 0.9 % IV BOLUS (SEPSIS)
1000.0000 mL | INTRAVENOUS | Status: AC
  Administered 2016-05-04: 1000 mL via INTRAVENOUS

## 2016-05-04 MED ORDER — DIPHENHYDRAMINE HCL 50 MG/ML IJ SOLN
25.0000 mg | Freq: Once | INTRAMUSCULAR | Status: AC
  Administered 2016-05-04: 25 mg via INTRAVENOUS
  Filled 2016-05-04: qty 1

## 2016-05-04 NOTE — ED Notes (Signed)
Pt requesting pain medication, dr. York Ceriseforbach informed, per dr. York CeriseForbach, he wishes to see pt prior to ordering meds, pt informed and verbalized understanding

## 2016-05-04 NOTE — ED Notes (Addendum)
Pt presents to ED with c/o mid abdominal pain along with nausea and vomiting since 2 pm yesterday. Pt alert and oriented x 4, no increased work in breathing noted. Pt also c/o right sided chest pain beginning 2 pm today. Pt has history of chronic abdominal pain.

## 2016-05-04 NOTE — ED Provider Notes (Signed)
[facility] Emergency Department Provider Note  ____________________________________________  Time seen: Approximately 11:45 PM  I have reviewed the triage vital signs and the nursing notes.   HISTORY  Chief Complaint Abdominal Pain    HPI Laurie Frederick is a 40 y.o. female with a well-established history of chronic abdominal pain and episodes of nausea and vomiting.  She has had 10 emergency department visits in the [facility] system in the last 6 months as well as multiple visits to [facility].  She has had extensive outpatient workups by gastroenterology and her primary care doctor including advanced imaging.  At no point has a specific cause of her pain but identified.  She has a care plan in place from her primary care doctor that advises that the patient exhibits strong narcotic seeking behavior and advises against administering narcotic pain medication while in the process of ruling out emergency medical conditions according to EMTALA guidelines.  Patient presents tonightcomplaining of mid abdominal pain with numerous episodes of nausea and vomiting that started yesterday.  She reports it is severe, unrelenting, and everything makes it worse and nothing makes it better.  She reports that this is similar to her prior episodes.  She is sobbing and requesting Dilaudid.  She keeps asking why nobody can figure out what is wrong with her.  She denies fever/chills, chest pain, shortness of breath, dysuria.  She has had decreased by mouth intake due to her inability to keep anything down.  She denies hematemesis.  She denies blood in her stool.   Past Medical History  Diagnosis Date  . Sleep apnea   . GERD (gastroesophageal reflux disease)   . Hyperlipidemia   . Depression   . Anxiety   . PTSD (post-traumatic stress disorder)   . Obesity   . Anemia   . Vitamin D deficiency   . Nontoxic uninodular goiter   . Multiple gastric ulcers   . H pylori ulcer      Patient Active Problem List   Diagnosis Date Noted  . Overdose 04/11/2016  . Abdominal pain   . Anxiety   . Uncontrollable vomiting   . UTI (lower urinary tract infection) 03/13/2016  . Abdominal pain, epigastric   . Gastritis   . Hypokalemia 08/16/2015  . Chronic prescription benzodiazepine use 08/16/2015  . Intractable nausea and vomiting 07/22/2015  . Epigastric pain   . Helicobacter positive gastritis 07/16/2015  . Gastritis and gastroduodenitis 07/16/2015  . Hyperlipidemia 07/13/2015  . Vitamin D deficiency 07/13/2015  . Thyroid nodule 07/13/2015  . Polyp of gallbladder 07/13/2015  . Liver nodule 07/13/2015  . Nausea & vomiting 07/13/2015  . Gastroenteritis 07/06/2015  . 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 Alcohol abuse 04/01/2015    Past Surgical History  Procedure Laterality Date  . Cesarean section    . Esophagogastroduodenoscopy (egd) with propofol Left 07/07/2015    Procedure: ESOPHAGOGASTRODUODENOSCOPY (EGD) WITH PROPOFOL;  Surgeon: Scot Junobert T Elliott, MD;  [facility];  Service: Endoscopy;  Laterality: Left;  . Cesarean section    . Esophagogastroduodenoscopy (egd) with propofol N/A 09/05/2015    Procedure: ESOPHAGOGASTRODUODENOSCOPY (EGD) WITH PROPOFOL;  Surgeon: Midge Miniumarren Wohl, MD;  Location: [facility] ENDOSCOPY;  Service: Endoscopy;  Laterality: N/A;  . Dilation and curettage of uterus      x 2 TAB  . Esophagogastroduodenoscopy (egd) with propofol N/A 03/15/2016    Procedure: ESOPHAGOGASTRODUODENOSCOPY (EGD) WITH PROPOFOL;  Surgeon: Midge Miniumarren Wohl, MD;  Location: [facility] ENDOSCOPY;  Service: Endoscopy;  Laterality: N/A;  Current Outpatient Rx  Name  Route  Sig  Dispense  Refill  . alprazolam (XANAX) 2 MG tablet   Oral   Take 2 mg by mouth 2 (two) times daily as needed for anxiety.          Marland Kitchen buPROPion (WELLBUTRIN XL) 150 MG 24 hr tablet   Oral   Take 150 mg by mouth daily.         . citalopram (CELEXA) 40 MG tablet   Oral   Take 40 mg  by mouth at bedtime.         Marland Kitchen lisinopril (PRINIVIL,ZESTRIL) 20 MG tablet   Oral   Take 20 mg by mouth daily.         Marland Kitchen omeprazole (PRILOSEC) 20 MG capsule   Oral   Take 20 mg by mouth 2 (two) times daily before a meal.         . pantoprazole (PROTONIX) 20 MG tablet   Oral   Take 20 mg by mouth 2 (two) times daily.         . QUEtiapine (SEROQUEL) 50 MG tablet   Oral   Take 100 mg by mouth at bedtime.         . sucralfate (CARAFATE) 1 g tablet   Oral   Take 1 tablet (1 g total) by mouth 4 (four) times daily -  with meals and at bedtime.   30 tablet   1   . zolpidem (AMBIEN) 10 MG tablet   Oral   Take 10 mg by mouth at bedtime.         . metoCLOPramide (REGLAN) 10 MG tablet   Oral   Take 1 tablet (10 mg total) by mouth every 8 (eight) hours as needed for nausea or vomiting.   20 tablet   0   . potassium chloride (K-DUR) 10 MEQ tablet   Oral   Take 1 tablet (10 mEq total) by mouth daily.   5 tablet   0   . potassium chloride SA (KLOR-CON M20) 20 MEQ tablet   Oral   Take 1 tablet (20 mEq total) by mouth daily.   7 tablet   0   . promethazine (PHENERGAN) 25 MG suppository   Rectal   Place 1 suppository (25 mg total) rectally every 6 (six) hours as needed for nausea.   12 suppository   1     Allergies Zofran  Family History  Problem Relation Age of Onset  . Lung cancer Father   . Cancer Father     lung cancer  . Alcohol abuse Father   . Lung cancer Paternal Aunt   . Cancer Paternal Aunt     lung  . Bone cancer Paternal Uncle   . Cancer Paternal Uncle     pancreatic  . Depression Mother   . Anxiety disorder Mother   . Arthritis Mother   . Hyperlipidemia Mother   . Hypertension Maternal Grandmother   . Diabetes Paternal Grandmother   . Heart disease Paternal Grandmother   . Hypertension Paternal Grandmother   . Asthma Son   . Vision loss Neg Hx   . COPD Neg Hx     Social History Social History  Substance Use Topics  . Smoking  status: Current Some Day Smoker -- 1.00 packs/day for 17 years    Types: Cigarettes  . Smokeless tobacco: Never Used  . Alcohol Use: No     Comment: occasional    Review of Systems Constitutional:  No fever/chills Eyes: No visual changes. ENT: No sore throat. Cardiovascular: Denies chest pain. Respiratory: Denies shortness of breath. Gastrointestinal: Severe mid abdominal pain with numerous episodes of nausea and vomiting Genitourinary: Negative for dysuria. Musculoskeletal: Negative for back pain. Skin: Negative for rash. Neurological: Negative for headaches, focal weakness or numbness.  10-point ROS otherwise negative.  ____________________________________________   PHYSICAL EXAM:  VITAL SIGNS: ED Triage Vitals  Enc Vitals Group     BP 05/04/16 1911 136/93 mmHg     Pulse Rate 05/04/16 1911 87     Resp 05/04/16 1911 18     Temp 05/04/16 1911 99.2 F (37.3 C)     Temp Source 05/04/16 1911 Oral     SpO2 05/04/16 1911 100 %     Weight 05/04/16 1911 165 lb (74.844 kg)     Height 05/04/16 1911 5\' 2"  (1.575 m)     Head Cir --      Peak Flow --      Pain Score 05/04/16 1911 10     Pain Loc --      Pain Edu? --      Excl. in GC? --     Constitutional: Alert and oriented. Well appearing though she is sobbing and anxious Eyes: Conjunctivae are normal. PERRL. EOMI. Head: Atraumatic. Nose: No congestion/rhinnorhea. Mouth/Throat: Mucous membranes are moist and without any signs of dehydration.  Oropharynx non-erythematous. Neck: No stridor.  No meningeal signs.   Cardiovascular: Normal rate, regular rhythm. Good peripheral circulation. Grossly normal heart sounds.   Respiratory: Normal respiratory effort.  No retractions. Lungs CTAB. Gastrointestinal: Soft and nontender, patient was talking to me throughout my palpation of her abdomen and in no point reacted to my palpation. No distention.  Musculoskeletal: No lower extremity tenderness nor edema. No gross deformities of  extremities. Neurologic:  Normal speech and language. No gross focal neurologic deficits are appreciated.  Skin:  Skin is warm, dry and intact. No rash noted. Psychiatric: The patient is upset, crying and sobbing and asking why we cannot figure out what is wrong with her.  ____________________________________________   LABS (all labs ordered are listed, but only abnormal results are displayed)  Labs Reviewed  COMPREHENSIVE METABOLIC PANEL - Abnormal; Notable for the following:    Potassium 3.0 (*)    Glucose, Bld 127 (*)    All other components within normal limits  CBC - Abnormal; Notable for the following:    WBC 12.9 (*)    Hemoglobin 11.9 (*)    RDW 16.6 (*)    All other components within normal limits  URINALYSIS COMPLETEWITH MICROSCOPIC ([facility] ONLY) - Abnormal; Notable for the following:    Color, Urine AMBER (*)    APPearance CLOUDY (*)    Ketones, ur 1+ (*)    Specific Gravity, Urine 1.032 (*)    Protein, ur 100 (*)    Squamous Epithelial / LPF 0-5 (*)    All other components within normal limits  URINE DRUG SCREEN, QUALITATIVE ([facility] ONLY) - Abnormal; Notable for the following:    Tricyclic, Ur Screen POSITIVE (*)    All other components within normal limits  LIPASE, BLOOD  POC URINE PREG, ED  POCT PREGNANCY, URINE   ____________________________________________  EKG  ED ECG REPORT I, Abigael Mogle, the attending physician, personally viewed and interpreted this ECG.  Date: 05/04/2016 EKG Time: 19:17 Rate: 85 Rhythm: normal sinus rhythm QRS Axis: normal Intervals: normal ST/T Wave abnormalities: Non-specific ST segment / T-wave changes, but no evidence of  acute ischemia. Conduction Disturbances: none Narrative Interpretation: unremarkable  ____________________________________________  RADIOLOGY   US Renal  05/04/2016  CLINICAL DATA:  Acute onset of hematuria and vomiting. Initial encounter. All EXAM: RENAL / URINARY TRACT ULTRASOUND COMPLETE COMPARISON:   CT of the abdomen and pelvis performed 09/19/2015 FINDINGS: Right Kidney: Length: 10.7 cm. Echogenicity within normal limits. No mass or hydronephrosis visualized. Left Kidney: Length: 11.5 cm. Echogenicity within normal limits. No mass or hydronephrosis visualized. Bladder: Appears normal for degree of bladder distention. IMPRESSION: Unremarkable renal ultrasound.  No evidence of hydronephrosis. Electronically Signed   By: Roanna Raider M.D.   On: 05/04/2016 23:20    ____________________________________________   PROCEDURES  Procedure(s) performed:   Procedures   ____________________________________________   INITIAL IMPRESSION / ASSESSMENT AND PLAN / ED COURSE  Pertinent labs & imaging results that were available during my care of the patient were reviewed by me and considered in my medical decision making (see chart for details).  The patient's lab work is unremarkable except for a very mild leukocytosis which is normal for her and a potassium of 3.0.  The rest of it is reassuring.  She has a negative urine pregnancy and a normal urinalysis.  I have added on a urine drug screen to check for the possibility of cannabinoid hyperemesis syndrome.  I explained to her that I will do whatever I can within her care plan to make her feel better but that we will not be administering narcotic medication.  Given the refractory nature of her nausea and vomiting I am giving her 2.5 mg Haldol IV and 25 mg Benadryl IV and we will re-dose as needed.  This is been successful in the past especially for cyclic vomiting syndrome and chronic abdominal pain.  I anticipate discharge with outpatient follow-up and with potassium supplementation.   (Note that documentation was delayed due to multiple ED patients requiring immediate care.)   Patient felt better but still with persistent nausea.  Denies abd pain.  Agreeable to going home for outpatient follow  up. ____________________________________________  FINAL CLINICAL IMPRESSION(S) / ED DIAGNOSES  Final diagnoses:  Chronic abdominal pain  Nausea and vomiting, vomiting of unspecified type  Hypokalemia, gastrointestinal losses     MEDICATIONS GIVEN DURING THIS VISIT:  Medications  promethazine (PHENERGAN) suppository 25 mg (25 mg Rectal Given 05/04/16 2204)  sodium chloride 0.9 % bolus 1,000 mL (0 mLs Intravenous Stopped 05/05/16 0155)  haloperidol lactate (HALDOL) injection 2.5 mg (2.5 mg Intravenous Given 05/04/16 2358)  diphenhydrAMINE (BENADRYL) injection 25 mg (25 mg Intravenous Given 05/04/16 2358)  potassium chloride SA (K-DUR,KLOR-CON) CR tablet 40 mEq (40 mEq Oral Given 05/05/16 0135)  promethazine (PHENERGAN) suppository 25 mg (25 mg Rectal Given 05/05/16 0159)     NEW OUTPATIENT MEDICATIONS STARTED DURING THIS VISIT:  Discharge Medication List as of 05/05/2016  1:31 AM    START taking these medications   Details  potassium chloride SA (KLOR-CON M20) 20 MEQ tablet Take 1 tablet (20 mEq total) by mouth daily., Starting 05/05/2016, Until Discontinued, Print    promethazine (PHENERGAN) 25 MG suppository Place 1 suppository (25 mg total) rectally every 6 (six) hours as needed for nausea., Starting 05/05/2016, Until Sat 05/05/17, Print          Note:  This document was prepared using Dragon voice recognition software and may include unintentional dictation errors.   Loleta Rose, MD 05/05/16 [phone]

## 2016-05-05 LAB — URINE DRUG SCREEN, QUALITATIVE (ARMC ONLY)
Amphetamines, Ur Screen: NOT DETECTED
Barbiturates, Ur Screen: NOT DETECTED
Benzodiazepine, Ur Scrn: NOT DETECTED
COCAINE METABOLITE, UR ~~LOC~~: NOT DETECTED
Cannabinoid 50 Ng, Ur ~~LOC~~: NOT DETECTED
MDMA (ECSTASY) UR SCREEN: NOT DETECTED
METHADONE SCREEN, URINE: NOT DETECTED
OPIATE, UR SCREEN: NOT DETECTED
Phencyclidine (PCP) Ur S: NOT DETECTED
TRICYCLIC, UR SCREEN: POSITIVE — AB

## 2016-05-05 MED ORDER — POTASSIUM CHLORIDE CRYS ER 20 MEQ PO TBCR
40.0000 meq | EXTENDED_RELEASE_TABLET | Freq: Once | ORAL | Status: AC
  Administered 2016-05-05: 40 meq via ORAL
  Filled 2016-05-05: qty 2

## 2016-05-05 MED ORDER — PROMETHAZINE HCL 25 MG RE SUPP
RECTAL | Status: AC
  Administered 2016-05-05: 25 mg via RECTAL
  Filled 2016-05-05: qty 1

## 2016-05-05 MED ORDER — PROMETHAZINE HCL 25 MG RE SUPP
25.0000 mg | Freq: Once | RECTAL | Status: AC
  Administered 2016-05-05: 25 mg via RECTAL

## 2016-05-05 MED ORDER — HALOPERIDOL LACTATE 5 MG/ML IJ SOLN: 2.5000 mg | Freq: Once | INTRAMUSCULAR | Status: DC

## 2016-05-05 MED ORDER — PROMETHAZINE HCL 25 MG RE SUPP: 25.0000 mg | Freq: Four times a day (QID) | RECTAL | Status: DC | PRN

## 2016-05-05 MED ORDER — POTASSIUM CHLORIDE CRYS ER 20 MEQ PO TBCR: 20.0000 meq | EXTENDED_RELEASE_TABLET | Freq: Every day | ORAL | Status: DC

## 2016-05-05 NOTE — Discharge Instructions (Signed)
You have been seen in the Emergency Department (ED) for abdominal pain and frequent nausea and vomiting.  Your evaluation did not identify a clear cause of your symptoms but was generally reassuring.  Please follow up as instructed above regarding todays emergent visit and the symptoms that are bothering you.  As we discussed, we cannot provide narcotics for chronic pain according to the [facility] Chronic Pain policy.  Return to the ED if your abdominal pain worsens or fails to improve, you develop bloody vomiting, bloody diarrhea, you are unable to tolerate fluids due to vomiting, fever greater than 101, or other symptoms that concern you.   Abdominal Pain, Adult Many things can cause abdominal pain. Usually, abdominal pain is not caused by a disease and will improve without treatment. It can often be observed and treated at home. Your health care provider will do a physical exam and possibly order blood tests and X-rays to help determine the seriousness of your pain. However, in many cases, more time must pass before a clear cause of the pain can be found. Before that point, your health care provider may not know if you need more testing or further treatment. HOME CARE INSTRUCTIONS Monitor your abdominal pain for any changes. The following actions may help to alleviate any discomfort you are experiencing:  Only take over-the-counter or prescription medicines as directed by your health care provider.  Do not take laxatives unless directed to do so by your health care provider.  Try a clear liquid diet (broth, tea, or water) as directed by your health care provider. Slowly move to a bland diet as tolerated. SEEK MEDICAL CARE IF:  You have unexplained abdominal pain.  You have abdominal pain associated with nausea or diarrhea.  You have pain when you urinate or have a bowel movement.  You experience abdominal pain that wakes you in the night.  You have abdominal pain that is worsened or  improved by eating food.  You have abdominal pain that is worsened with eating fatty foods.  You have a fever. SEEK IMMEDIATE MEDICAL CARE IF:  Your pain does not go away within 2 hours.  You keep throwing up (vomiting).  Your pain is felt only in portions of the abdomen, such as the right side or the left lower portion of the abdomen.  You pass bloody or black tarry stools. MAKE SURE YOU:  Understand these instructions.  Will watch your condition.  Will get help right away if you are not doing well or get worse.   This information is not intended to replace advice given to you by your health care provider. Make sure you discuss any questions you have with your health care provider.   Document Released: 07/26/2005 Document Revised: 07/07/2015 Document Reviewed: 06/25/2013 Elsevier Interactive Patient Education 2016 [employer]vier Inc.   Nausea and Vomiting Nausea is a sick feeling that often comes before throwing up (vomiting). Vomiting is a reflex where stomach contents come out of your mouth. Vomiting can cause severe loss of body fluids (dehydration). Children and elderly adults can become dehydrated quickly, especially if they also have diarrhea. Nausea and vomiting are symptoms of a condition or disease. It is important to find the cause of your symptoms. CAUSES   Direct irritation of the stomach lining. This irritation can result from increased acid production (gastroesophageal reflux disease), infection, food poisoning, taking certain medicines (such as nonsteroidal anti-inflammatory drugs), alcohol use, or tobacco use.  Signals from the brain.These signals could be caused by  a headache, heat exposure, an inner ear disturbance, increased pressure in the brain from injury, infection, a tumor, or a concussion, pain, emotional stimulus, or metabolic problems.  An obstruction in the gastrointestinal tract (bowel obstruction).  Illnesses such as diabetes, hepatitis, gallbladder  problems, appendicitis, kidney problems, cancer, sepsis, atypical symptoms of a heart attack, or eating disorders.  Medical treatments such as chemotherapy and radiation.  Receiving medicine that makes you sleep (general anesthetic) during surgery. DIAGNOSIS Your caregiver may ask for tests to be done if the problems do not improve after a few days. Tests may also be done if symptoms are severe or if the reason for the nausea and vomiting is not clear. Tests may include:  Urine tests.  Blood tests.  Stool tests.  Cultures (to look for evidence of infection).  X-rays or other imaging studies. Test results can help your caregiver make decisions about treatment or the need for additional tests. TREATMENT You need to stay well hydrated. Drink frequently but in small amounts.You may wish to drink water, sports drinks, clear broth, or eat frozen ice pops or gelatin dessert to help stay hydrated.When you eat, eating slowly may help prevent nausea.There are also some antinausea medicines that may help prevent nausea. HOME CARE INSTRUCTIONS   Take all medicine as directed by your caregiver.  If you do not have an appetite, do not force yourself to eat. However, you must continue to drink fluids.  If you have an appetite, eat a normal diet unless your caregiver tells you differently.  Eat a variety of complex carbohydrates (rice, wheat, potatoes, bread), lean meats, yogurt, fruits, and vegetables.  Avoid high-fat foods because they are more difficult to digest.  Drink enough water and fluids to keep your urine clear or pale yellow.  If you are dehydrated, ask your caregiver for specific rehydration instructions. Signs of dehydration may include:  Severe thirst.  Dry lips and mouth.  Dizziness.  Dark urine.  Decreasing urine frequency and amount.  Confusion.  Rapid breathing or pulse. SEEK IMMEDIATE MEDICAL CARE IF:   You have blood or brown flecks (like coffee grounds) in  your vomit.  You have black or bloody stools.  You have a severe headache or stiff neck.  You are confused.  You have severe abdominal pain.  You have chest pain or trouble breathing.  You do not urinate at least once every 8 hours.  You develop cold or clammy skin.  You continue to vomit for longer than 24 to 48 hours.  You have a fever. MAKE SURE YOU:   Understand these instructions.  Will watch your condition.  Will get help right away if you are not doing well or get worse.   This information is not intended to replace advice given to you by your health care provider. Make sure you discuss any questions you have with your health care provider.   Document Released: 10/16/2005 Document Revised: 01/08/2012 Document Reviewed: 03/15/2011 Elsevier Interactive Patient Education 2016 [employer]  Hypokalemia Hypokalemia means that the amount of potassium in the blood is lower than normal.Potassium is a chemical, called an electrolyte, that helps regulate the amount of fluid in the body. It also stimulates muscle contraction and helps nerves function properly.Most of the body's potassium is inside of cells, and only a very small amount is in the blood. Because the amount in the blood is so small, minor changes can be life-threatening. CAUSES  Antibiotics.  Diarrhea or vomiting.  Using laxatives too much,  which can cause diarrhea.  Chronic kidney disease.  Water pills (diuretics).  Eating disorders (bulimia).  Low magnesium level.  Sweating a lot. SIGNS AND SYMPTOMS  Weakness.  Constipation.  Fatigue.  Muscle cramps.  Mental confusion.  Skipped heartbeats or irregular heartbeat (palpitations).  Tingling or numbness. DIAGNOSIS  Your health care provider can diagnose hypokalemia with blood tests. In addition to checking your potassium level, your health care provider may also check other lab tests. TREATMENT Hypokalemia can be treated with potassium  supplements taken by mouth or adjustments in your current medicines. If your potassium level is very low, you may need to get potassium through a vein (IV) and be monitored in the hospital. A diet high in potassium is also helpful. Foods high in potassium are:  Nuts, such as peanuts and pistachios.  Seeds, such as sunflower seeds and pumpkin seeds.  Peas, lentils, and lima beans.  Whole grain and bran cereals and breads.  Fresh fruit and vegetables, such as apricots, avocado, bananas, cantaloupe, kiwi, oranges, tomatoes, asparagus, and potatoes.  Orange and tomato juices.  Red meats.  Fruit yogurt. HOME CARE INSTRUCTIONS  Take all medicines as prescribed by your health care provider.  Maintain a healthy diet by including nutritious food, such as fruits, vegetables, nuts, whole grains, and lean meats.  If you are taking a laxative, be sure to follow the directions on the label. SEEK MEDICAL CARE IF:  Your weakness gets worse.  You feel your heart pounding or racing.  You are vomiting or having diarrhea.  You are diabetic and having trouble keeping your blood glucose in the normal range. SEEK IMMEDIATE MEDICAL CARE IF:  You have chest pain, shortness of breath, or dizziness.  You are vomiting or having diarrhea for more than 2 days.  You faint. MAKE SURE YOU:   Understand these instructions.  Will watch your condition.  Will get help right away if you are not doing well or get worse.   This information is not intended to replace advice given to you by your health care provider. Make sure you discuss any questions you have with your health care provider.   Document Released: 10/16/2005 Document Revised: 11/06/2014 Document Reviewed: 04/18/2013 Elsevier Interactive Patient Education 2016 Elsevier Inc.  Potassium Content of Foods Potassium is a mineral found in many foods and drinks. It helps keep fluids and minerals balanced in your body and affects how steadily  your heart beats. Potassium also helps control your blood pressure and keep your muscles and nervous system healthy. Certain health conditions and medicines may change the balance of potassium in your body. When this happens, you can help balance your level of potassium through the foods that you do or do not eat. Your health care provider or dietitian may recommend an amount of potassium that you should have each day. The following lists of foods provide the amount of potassium (in parentheses) per serving in each item. HIGH IN POTASSIUM  The following foods and beverages have 200 mg or more of potassium per serving:  Apricots, 2 raw or 5 dry (200 mg).  Artichoke, 1 medium (345 mg).  Avocado, raw,  each (245 mg).  Banana, 1 medium (425 mg).  Beans, lima, or baked beans, canned,  cup (280 mg).  Beans, white, canned,  cup (595 mg).  Beef roast, 3 oz (320 mg).  Beef, ground, 3 oz (270 mg).  Beets, raw or cooked,  cup (260 mg).  Bran muffin, 2 oz (300 mg).  Broccoli,  cup (230 mg).  Brussels sprouts,  cup (250 mg).  Cantaloupe,  cup (215 mg).  Cereal, 100% bran,  cup (200-400 mg).  Cheeseburger, single, fast food, 1 each (225-400 mg).  Chicken, 3 oz (220 mg).  Clams, canned, 3 oz (535 mg).  Crab, 3 oz (225 mg).  Dates, 5 each (270 mg).  Dried beans and peas,  cup (300-475 mg).  Figs, dried, 2 each (260 mg).  Fish: halibut, tuna, cod, snapper, 3 oz (480 mg).  Fish: salmon, haddock, swordfish, perch, 3 oz (300 mg).  Fish, tuna, canned 3 oz (200 mg).  [place] fries, fast food, 3 oz (470 mg).  Granola with fruit and nuts,  cup (200 mg).  Grapefruit juice,  cup (200 mg).  Greens, beet,  cup (655 mg).  Honeydew melon,  cup (200 mg).  Kale, raw, 1 cup (300 mg).  Kiwi, 1 medium (240 mg).  Kohlrabi, rutabaga, parsnips,  cup (280 mg).  Lentils,  cup (365 mg).  Mango, 1 each (325 mg).  Milk, chocolate, 1 cup (420 mg).  Milk: nonfat, low-fat,  whole, buttermilk, 1 cup (350-380 mg).  Molasses, 1 Tbsp (295 mg).  Mushrooms,  cup (280) mg.  Nectarine, 1 each (275 mg).  Nuts: almonds, peanuts, hazelnuts, [place], cashew, mixed, 1 oz (200 mg).  Nuts, pistachios, 1 oz (295 mg).  Orange, 1 each (240 mg).  Orange juice,  cup (235 mg).  Papaya, medium,  fruit (390 mg).  Peanut butter, chunky, 2 Tbsp (240 mg).  Peanut butter, smooth, 2 Tbsp (210 mg).  Pear, 1 medium (200 mg).  Pomegranate, 1 whole (400 mg).  Pomegranate juice,  cup (215 mg).  Pork, 3 oz (350 mg).  Potato chips, salted, 1 oz (465 mg).  Potato, baked with skin, 1 medium (925 mg).  Potatoes, boiled,  cup (255 mg).  Potatoes, mashed,  cup (330 mg).  Prune juice,  cup (370 mg).  Prunes, 5 each (305 mg).  Pudding, chocolate,  cup (230 mg).  Pumpkin, canned,  cup (250 mg).  Raisins, seedless,  cup (270 mg).  Seeds, sunflower or pumpkin, 1 oz (240 mg).  Soy milk, 1 cup (300 mg).  Spinach,  cup (420 mg).  Spinach, canned,  cup (370 mg).  Sweet potato, baked with skin, 1 medium (450 mg).  Swiss chard,  cup (480 mg).  Tomato or vegetable juice,  cup (275 mg).  Tomato sauce or puree,  cup (400-550 mg).  Tomato, raw, 1 medium (290 mg).  Tomatoes, canned,  cup (200-300 mg).  [place], 3 oz (250 mg).  Wheat germ, 1 oz (250 mg).  Winter squash,  cup (250 mg).  Yogurt, plain or fruited, 6 oz (260-435 mg).  Zucchini,  cup (220 mg). MODERATE IN POTASSIUM The following foods and beverages have 50-200 mg of potassium per serving:  Apple, 1 each (150 mg).  Apple juice,  cup (150 mg).  Applesauce,  cup (90 mg).  Apricot nectar,  cup (140 mg).  Asparagus, small spears,  cup or 6 spears (155 mg).  Bagel, cinnamon raisin, 1 each (130 mg).  Bagel, egg or plain, 4 in., 1 each (70 mg).  Beans, green,  cup (90 mg).  Beans, yellow,  cup (190 mg).  Beer, regular, 12 oz (100 mg).  Beets, canned,  cup (125  mg).  Blackberries,  cup (115 mg).  Blueberries,  cup (60 mg).  Bread, whole wheat, 1 slice (70 mg).  Broccoli, raw,  cup (145 mg).  Cabbage,  cup (150 mg).  Carrots, cooked or raw,  cup (180 mg).  Cauliflower, raw,  cup (150 mg).  Celery, raw,  cup (155 mg).  Cereal, bran flakes, cup (120-150 mg).  Cheese, cottage,  cup (110 mg).  Cherries, 10 each (150 mg).  Chocolate, 1 oz bar (165 mg).  Coffee, brewed 6 oz (90 mg).  Corn,  cup or 1 ear (195 mg).  Cucumbers,  cup (80 mg).  Egg, large, 1 each (60 mg).  Eggplant,  cup (60 mg).  Endive, raw, cup (80 mg).  English muffin, 1 each (65 mg).  Fish, orange roughy, 3 oz (150 mg).  Frankfurter, beef or pork, 1 each (75 mg).  Fruit cocktail,  cup (115 mg).  Grape juice,  cup (170 mg).  Grapefruit,  fruit (175 mg).  Grapes,  cup (155 mg).  Greens: kale, turnip, collard,  cup (110-150 mg).  Ice cream or frozen yogurt, chocolate,  cup (175 mg).  Ice cream or frozen yogurt, vanilla,  cup (120-150 mg).  Lemons, limes, 1 each (80 mg).  Lettuce, all types, 1 cup (100 mg).  Mixed vegetables,  cup (150 mg).  Mushrooms, raw,  cup (110 mg).  Nuts: walnuts, pecans, or macadamia, 1 oz (125 mg).  Oatmeal,  cup (80 mg).  Okra,  cup (110 mg).  Onions, raw,  cup (120 mg).  Peach, 1 each (185 mg).  Peaches, canned,  cup (120 mg).  Pears, canned,  cup (120 mg).  Peas, green, frozen,  cup (90 mg).  Peppers, green,  cup (130 mg).  Peppers, red,  cup (160 mg).  Pineapple juice,  cup (165 mg).  Pineapple, fresh or canned,  cup (100 mg).  Plums, 1 each (105 mg).  Pudding, vanilla,  cup (150 mg).  Raspberries,  cup (90 mg).  Rhubarb,  cup (115 mg).  Rice, wild,  cup (80 mg).  Shrimp, 3 oz (155 mg).  Spinach, raw, 1 cup (170 mg).  Strawberries,  cup (125 mg).  Summer squash  cup (175-200 mg).  Swiss chard, raw, 1 cup (135 mg).  Tangerines, 1 each (140  mg).  Tea, brewed, 6 oz (65 mg).  Turnips,  cup (140 mg).  Watermelon,  cup (85 mg).  Wine, red, table, 5 oz (180 mg).  Wine, white, table, 5 oz (100 mg). LOW IN POTASSIUM The following foods and beverages have less than 50 mg of potassium per serving.  Bread, white, 1 slice (30 mg).  Carbonated beverages, 12 oz (less than 5 mg).  Cheese, 1 oz (20-30 mg).  Cranberries,  cup (45 mg).  Cranberry juice cocktail,  cup (20 mg).  Fats and oils, 1 Tbsp (less than 5 mg).  Hummus, 1 Tbsp (32 mg).  Nectar: papaya, mango, or pear,  cup (35 mg).  Rice, white or brown,  cup (50 mg).  Spaghetti or macaroni,  cup cooked (30 mg).  Tortilla, flour or corn, 1 each (50 mg).  Waffle, 4 in., 1 each (50 mg).  Water chestnuts,  cup (40 mg).   This information is not intended to replace advice given to you by your health care provider. Make sure you discuss any questions you have with your health care provider.   Document Released: 05/30/2005 Document Revised: 10/21/2013 Document Reviewed: 09/12/2013 Elsevier Interactive Patient Education [employer]vier Inc.

## 2016-05-06 ENCOUNTER — Emergency Department (HOSPITAL_COMMUNITY)
Admission: EM | Admit: 2016-05-06 | Discharge: 2016-05-06 | Disposition: A | Discharge status: Home / Self Care | Payer: Medicaid Other | Attending: Emergency Medicine | Admitting: Emergency Medicine

## 2016-05-06 ENCOUNTER — Encounter (HOSPITAL_COMMUNITY): Payer: Self-pay

## 2016-05-06 DIAGNOSIS — F329 Major depressive disorder, single episode, unspecified: Secondary | ICD-10-CM | POA: Diagnosis not present

## 2016-05-06 DIAGNOSIS — G8929 Other chronic pain: Secondary | ICD-10-CM | POA: Insufficient documentation

## 2016-05-06 DIAGNOSIS — F17210 Nicotine dependence, cigarettes, uncomplicated: Secondary | ICD-10-CM | POA: Insufficient documentation

## 2016-05-06 DIAGNOSIS — R1013 Epigastric pain: Secondary | ICD-10-CM | POA: Insufficient documentation

## 2016-05-06 DIAGNOSIS — E785 Hyperlipidemia, unspecified: Secondary | ICD-10-CM | POA: Diagnosis not present

## 2016-05-06 DIAGNOSIS — R109 Unspecified abdominal pain: Secondary | ICD-10-CM

## 2016-05-06 LAB — CBC WITH DIFFERENTIAL/PLATELET
BASOS ABS: 0 10*3/uL (ref 0.0–0.1)
BASOS PCT: 0 %
EOS ABS: 0 10*3/uL (ref 0.0–0.7)
EOS PCT: 0 %
HCT: 35.7 % — ABNORMAL LOW (ref 36.0–46.0)
Hemoglobin: 12 g/dL (ref 12.0–15.0)
Lymphocytes Relative: 29 %
Lymphs Abs: 2.9 10*3/uL (ref 0.7–4.0)
MCH: 28.7 pg (ref 26.0–34.0)
MCHC: 33.6 g/dL (ref 30.0–36.0)
MCV: 85.4 fL (ref 78.0–100.0)
MONO ABS: 0.9 10*3/uL (ref 0.1–1.0)
Monocytes Relative: 9 %
Neutro Abs: 6.3 10*3/uL (ref 1.7–7.7)
Neutrophils Relative %: 62 %
PLATELETS: 432 10*3/uL — AB (ref 150–400)
RBC: 4.18 MIL/uL (ref 3.87–5.11)
RDW: 16.3 % — AB (ref 11.5–15.5)
WBC: 10.1 10*3/uL (ref 4.0–10.5)

## 2016-05-06 LAB — COMPREHENSIVE METABOLIC PANEL
ALBUMIN: 4.7 g/dL (ref 3.5–5.0)
ALT: 18 U/L (ref 14–54)
ANION GAP: 11 (ref 5–15)
AST: 16 U/L (ref 15–41)
Alkaline Phosphatase: 77 U/L (ref 38–126)
BUN: 13 mg/dL (ref 6–20)
CALCIUM: 9.5 mg/dL (ref 8.9–10.3)
CHLORIDE: 105 mmol/L (ref 101–111)
CO2: 23 mmol/L (ref 22–32)
Creatinine, Ser: 0.83 mg/dL (ref 0.44–1.00)
GFR calc Af Amer: >60 mL/min (ref >60)
GFR calc non Af Amer: >60 mL/min (ref >60)
GLUCOSE: 96 mg/dL (ref 65–99)
POTASSIUM: 3 mmol/L — AB (ref 3.5–5.1)
Sodium: 139 mmol/L (ref 135–145)
Total Bilirubin: 0.3 mg/dL (ref 0.3–1.2)
Total Protein: 8.4 g/dL — ABNORMAL HIGH (ref 6.5–8.1)

## 2016-05-06 LAB — LIPASE, BLOOD: Lipase: 19 U/L (ref 11–51)

## 2016-05-06 MED ORDER — ALPRAZOLAM 0.25 MG PO TABS: 0.2500 mg | ORAL_TABLET | Freq: Three times a day (TID) | ORAL | Status: DC | PRN

## 2016-05-06 MED ORDER — SODIUM CHLORIDE 0.9 % IV SOLN: INTRAVENOUS | Status: DC

## 2016-05-06 MED ORDER — POTASSIUM CHLORIDE CRYS ER 20 MEQ PO TBCR
40.0000 meq | EXTENDED_RELEASE_TABLET | Freq: Once | ORAL | Status: AC
  Administered 2016-05-06: 40 meq via ORAL
  Filled 2016-05-06: qty 2

## 2016-05-06 MED ORDER — LORAZEPAM 2 MG/ML IJ SOLN
1.0000 mg | Freq: Once | INTRAMUSCULAR | Status: AC
  Administered 2016-05-06: 1 mg via INTRAVENOUS
  Filled 2016-05-06: qty 1

## 2016-05-06 MED ORDER — TRAMADOL HCL 50 MG PO TABS: 50.0000 mg | ORAL_TABLET | Freq: Four times a day (QID) | ORAL | Status: DC | PRN

## 2016-05-06 MED ORDER — METOCLOPRAMIDE HCL 5 MG/ML IJ SOLN
10.0000 mg | Freq: Once | INTRAMUSCULAR | Status: AC
  Administered 2016-05-06: 10 mg via INTRAVENOUS
  Filled 2016-05-06: qty 2

## 2016-05-06 MED ORDER — DIPHENHYDRAMINE HCL 50 MG/ML IJ SOLN
12.5000 mg | Freq: Once | INTRAMUSCULAR | Status: AC
  Administered 2016-05-06: 12.5 mg via INTRAVENOUS
  Filled 2016-05-06: qty 1

## 2016-05-06 MED ORDER — SODIUM CHLORIDE 0.9 % IV BOLUS (SEPSIS): 1000.0000 mL | Freq: Once | INTRAVENOUS | Status: DC

## 2016-05-06 MED ORDER — SODIUM CHLORIDE 0.9 % IV BOLUS (SEPSIS)
2000.0000 mL | Freq: Once | INTRAVENOUS | Status: AC
  Administered 2016-05-06: 2000 mL via INTRAVENOUS

## 2016-05-06 MED ORDER — POTASSIUM CHLORIDE CRYS ER 20 MEQ PO TBCR
40.0000 meq | EXTENDED_RELEASE_TABLET | Freq: Once | ORAL | Status: DC
Start: 2016-05-06 — End: 2016-05-07
  Filled 2016-05-06: qty 2

## 2016-05-06 NOTE — ED Notes (Signed)
Pt here with same complaint frequently.  Pt with abdominal pain. States pain is same.  No dx at this time.  Abdominal pain for "months".  Vomiting.  No fever.

## 2016-05-06 NOTE — ED Notes (Signed)
Pt very upset on arrival.  States she can't "take this anymore".  Crying. Demanding that we find out what's wrong.  Heart rate elevated.  Notified MD of labs from 2 days ago.  Pt states symptoms same.  Holding for protocol orders until seen by MD for evaluation

## 2016-05-06 NOTE — ED Provider Notes (Signed)
CSN: [id number]     Arrival date & time 05/06/16  1804 History   First MD Initiated Contact with Patient 05/06/16 1919     Chief Complaint  Patient presents with  . Abdominal Pain     (Consider location/radiation/quality/duration/timing/severity/associated sxs/prior Treatment) HPI Comments: 40 year old female with long-standing history of chronic abdominal pain with multiple evaluations presents with worsening of her chronic symptoms. Nothing seems to make her symptoms better or worse. Abdominal pain is epigastric with radiation to her back. She has had nonbilious vomiting. No fever or chills. No diarrhea. Denies any urinary symptoms. No vaginal bleeding or discharge. Was seen at [facility] 2 days ago for similar symptoms and that visit was reviewed. This treated with Haldol and Ativan and discharged. Patient presents here due to increased frustration caused by not having a diagnosis. Does have a primary care doctor.  Patient is a 40 y.o. female presenting with abdominal pain. The history is provided by the patient and a parent.  Abdominal Pain   Past Medical History  Diagnosis Date  . Sleep apnea   . GERD (gastroesophageal reflux disease)   . Hyperlipidemia   . Depression   . Anxiety   . PTSD (post-traumatic stress disorder)   . Obesity   . Anemia   . Vitamin D deficiency   . Nontoxic uninodular goiter   . Multiple gastric ulcers   . H pylori ulcer    Past Surgical History  Procedure Laterality Date  . Cesarean section    . Esophagogastroduodenoscopy (egd) with propofol Left 07/07/2015    Procedure: ESOPHAGOGASTRODUODENOSCOPY (EGD) WITH PROPOFOL;  Surgeon: Scot Junobert T Elliott, MD;  [facility];  Service: Endoscopy;  Laterality: Left;  . Cesarean section    . Esophagogastroduodenoscopy (egd) with propofol N/A 09/05/2015    Procedure: ESOPHAGOGASTRODUODENOSCOPY (EGD) WITH PROPOFOL;  Surgeon: Midge Miniumarren Wohl, MD;  Location: [facility] ENDOSCOPY;  Service: Endoscopy;   Laterality: N/A;  . Dilation and curettage of uterus      x 2 TAB  . Esophagogastroduodenoscopy (egd) with propofol N/A 03/15/2016    Procedure: ESOPHAGOGASTRODUODENOSCOPY (EGD) WITH PROPOFOL;  Surgeon: Midge Miniumarren Wohl, MD;  Location: [facility] ENDOSCOPY;  Service: Endoscopy;  Laterality: N/A;   Family History  Problem Relation Age of Onset  . Lung cancer Father   . Cancer Father     lung cancer  . Alcohol abuse Father   . Lung cancer Paternal Aunt   . Cancer Paternal Aunt     lung  . Bone cancer Paternal Uncle   . Cancer Paternal Uncle     pancreatic  . Depression Mother   . Anxiety disorder Mother   . Arthritis Mother   . Hyperlipidemia Mother   . Hypertension Maternal Grandmother   . Diabetes Paternal Grandmother   . Heart disease Paternal Grandmother   . Hypertension Paternal Grandmother   . Asthma Son   . Vision loss Neg Hx   . COPD Neg Hx    Social History  Substance Use Topics  . Smoking status: Current Some Day Smoker -- 1.00 packs/day for 17 years    Types: Cigarettes  . Smokeless tobacco: Never Used  . Alcohol Use: No     Comment: occasional   OB History    Gravida Para Term Preterm AB TAB SAB Ectopic Multiple Living   2 2 2  0 0 0 0 0 0 2     Review of Systems  Gastrointestinal: Positive for abdominal pain.  All other systems reviewed and are negative.  Allergies  Zofran  Home Medications   Prior to Admission medications   Medication Sig Start Date End Date Taking? Authorizing Provider  alprazolam Prudy Feeler) 2 MG tablet Take 2 mg by mouth 2 (two) times daily as needed for anxiety.     Historical Provider, MD  buPROPion (WELLBUTRIN XL) 150 MG 24 hr tablet Take 150 mg by mouth daily.    Historical Provider, MD  citalopram (CELEXA) 40 MG tablet Take 40 mg by mouth at bedtime.    Historical Provider, MD  lisinopril (PRINIVIL,ZESTRIL) 20 MG tablet Take 20 mg by mouth daily.    Historical Provider, MD  metoCLOPramide (REGLAN) 10 MG tablet Take 1 tablet (10 mg  total) by mouth every 8 (eight) hours as needed for nausea or vomiting. 04/16/16   Sharyn Creamer, MD  omeprazole (PRILOSEC) 20 MG capsule Take 20 mg by mouth 2 (two) times daily before a meal.    Historical Provider, MD  pantoprazole (PROTONIX) 20 MG tablet Take 20 mg by mouth 2 (two) times daily.    Historical Provider, MD  potassium chloride (K-DUR) 10 MEQ tablet Take 1 tablet (10 mEq total) by mouth daily. 04/16/16   Sharyn Creamer, MD  potassium chloride SA (KLOR-CON M20) 20 MEQ tablet Take 1 tablet (20 mEq total) by mouth daily. 05/05/16   Loleta Rose, MD  promethazine (PHENERGAN) 25 MG suppository Place 1 suppository (25 mg total) rectally every 6 (six) hours as needed for nausea. 05/05/16 05/05/17  Loleta Rose, MD  QUEtiapine (SEROQUEL) 50 MG tablet Take 100 mg by mouth at bedtime.    Historical Provider, MD  sucralfate (CARAFATE) 1 g tablet Take 1 tablet (1 g total) by mouth 4 (four) times daily -  with meals and at bedtime. 04/08/16   Jene Every, MD  zolpidem (AMBIEN) 10 MG tablet Take 10 mg by mouth at bedtime.    Historical Provider, MD   BP 167/102 mmHg  Pulse 124  Temp(Src) 98.5 F (36.9 C) (Oral)  Resp 20  SpO2 95%  LMP 04/02/2016 (Exact Date) Physical Exam  Constitutional: She is oriented to person, place, and time. She appears well-developed and well-nourished.  Non-toxic appearance. No distress.  HENT:  Head: Normocephalic and atraumatic.  Eyes: Conjunctivae, EOM and lids are normal. Pupils are equal, round, and reactive to light.  Neck: Normal range of motion. Neck supple. No tracheal deviation present. No thyroid mass present.  Cardiovascular: Regular rhythm and normal heart sounds.  Tachycardia present.  Exam reveals no gallop.   No murmur heard. Pulmonary/Chest: Effort normal and breath sounds normal. No stridor. No respiratory distress. She has no decreased breath sounds. She has no wheezes. She has no rhonchi. She has no rales.  Abdominal: Soft. Normal appearance and bowel  sounds are normal. She exhibits no distension. There is no tenderness. There is no rebound and no CVA tenderness.    Musculoskeletal: Normal range of motion. She exhibits no edema or tenderness.  Neurological: She is alert and oriented to person, place, and time. She has normal strength. No cranial nerve deficit or sensory deficit. GCS eye subscore is 4. GCS verbal subscore is 5. GCS motor subscore is 6.  Skin: Skin is warm and dry. No abrasion and no rash noted.  Psychiatric: Her mood appears anxious. Her speech is rapid and/or pressured. She is agitated.  Nursing note and vitals reviewed.   ED Course  Procedures (including critical care time) Labs Review Labs Reviewed  CBC WITH DIFFERENTIAL/PLATELET  COMPREHENSIVE METABOLIC PANEL  LIPASE,  BLOOD    Imaging Review US Renal  05/04/2016  CLINICAL DATA:  Acute onset of hematuria and vomiting. Initial encounter. All EXAM: RENAL / URINARY TRACT ULTRASOUND COMPLETE COMPARISON:  CT of the abdomen and pelvis performed 09/19/2015 FINDINGS: Right Kidney: Length: 10.7 cm. Echogenicity within normal limits. No mass or hydronephrosis visualized. Left Kidney: Length: 11.5 cm. Echogenicity within normal limits. No mass or hydronephrosis visualized. Bladder: Appears normal for degree of bladder distention. IMPRESSION: Unremarkable renal ultrasound.  No evidence of hydronephrosis. Electronically Signed   By: Roanna Raider M.D.   On: 05/04/2016 23:20   I have personally reviewed and evaluated these images and lab results as part of my medical decision-making.   EKG Interpretation None      MDM   Final diagnoses:  None    Patient's old records reviewed extensively. She has a well-documented history of multiple evaluations for abdominal pain as well is for narcotic abuse. She is very anxious and agitated at this time. She has requested multiple times to have narcotic pain medication which I have refused. I did treat the patient's symptoms with  Reglan, Ativan, Benadryl. Patient has periods when she is very comfortable when she becomes agitated she becomes very combative. Abdominal exam is nonacute. She has mild hyperkalemia which appears to be chronic and was treated with oral potassium here. She is afebrile.  I had multiple discussions with the patient about her chronic abdominal pain. She is insisting that she be admitted for further evaluation. Patient does have a family physician in [facility] and I strongly encouraged her to follow-up with this person.. She has become verbally abusive. I discussed the patient's condition in front of the patient's mother with her permission. The Neurosurgeon for the hospital as well as the charge nurse were present during my conversations with this patient. Patient has refused to be discharged at this time and is demanding to be admitted to the hospital. Pt will be given xanax. a After I informed her that her treatment is completed and that she is to be discharged she was given refused. she  will be escorted off hospital property.   Lorre Nick, MD 05/06/16 2144

## 2016-05-06 NOTE — ED Notes (Signed)
Dr Freida BusmanAllen requested that this RN go in with him to discuss pt being discharged; pt demanding to be admitted and further testing be done; Dr Freida BusmanAllen advised pt that she does not meet admission criteria and that she needs to follow up with her PCP for further testing and evaluation; pt upset and stating that she is not leaving and will have to be escorted off the property; pt sitting up in bed, resp even and unlabored, no signs of acute distress noted; Dr Freida BusmanAllen offered Ultram as discharge medication and pt states "The only thing that has ever taken care of my pain is Dilaudid and I want that" Dr Freida BusmanAllen advised that no prescription for Dilaudid will be given; pt again insisted that she would not leave unless she was escorted out of here; Dr Freida BusmanAllen advised pt that that is a decision that she is making and that nobody wants to have her escorted out of here; Georgette DoverSara Marshall, [facility] in room to talk to patient and her concerns

## 2016-05-06 NOTE — Discharge Instructions (Signed)

## 2016-05-06 NOTE — ED Notes (Addendum)
Pt refused K tablets. Pt removed her IV when she was talking to Dr. Freida BusmanAllen. Pt states she will not leave when discharged, but she will need to be removed by security    Pt refused dc vital signs

## 2016-06-13 ENCOUNTER — Encounter: Payer: Self-pay | Admitting: Emergency Medicine

## 2016-06-13 ENCOUNTER — Emergency Department
Admission: EM | Admit: 2016-06-13 | Discharge: 2016-06-13 | Disposition: A | Discharge status: Home / Self Care | Payer: Medicaid Other | Attending: Emergency Medicine | Admitting: Emergency Medicine

## 2016-06-13 DIAGNOSIS — R109 Unspecified abdominal pain: Secondary | ICD-10-CM | POA: Diagnosis not present

## 2016-06-13 DIAGNOSIS — R112 Nausea with vomiting, unspecified: Secondary | ICD-10-CM | POA: Insufficient documentation

## 2016-06-13 DIAGNOSIS — F17210 Nicotine dependence, cigarettes, uncomplicated: Secondary | ICD-10-CM | POA: Insufficient documentation

## 2016-06-13 LAB — URINALYSIS COMPLETE WITH MICROSCOPIC (ARMC ONLY)
Bacteria, UA: NONE SEEN
Bilirubin Urine: NEGATIVE
GLUCOSE, UA: 50 mg/dL — AB
NITRITE: NEGATIVE
Protein, ur: 100 mg/dL — AB
SPECIFIC GRAVITY, URINE: 1.031 — AB (ref 1.005–1.030)
pH: 5 (ref 5.0–8.0)

## 2016-06-13 LAB — CBC
HCT: 36 % (ref 35.0–47.0)
Hemoglobin: 12.2 g/dL (ref 12.0–16.0)
MCH: 28.7 pg (ref 26.0–34.0)
MCHC: 33.8 g/dL (ref 32.0–36.0)
MCV: 84.8 fL (ref 80.0–100.0)
PLATELETS: 347 10*3/uL (ref 150–440)
RBC: 4.25 MIL/uL (ref 3.80–5.20)
RDW: 15.8 % — ABNORMAL HIGH (ref 11.5–14.5)
WBC: 11.8 10*3/uL — ABNORMAL HIGH (ref 3.6–11.0)

## 2016-06-13 LAB — COMPREHENSIVE METABOLIC PANEL
ALT: 15 U/L (ref 14–54)
ANION GAP: 12 (ref 5–15)
AST: 26 U/L (ref 15–41)
Albumin: 4.9 g/dL (ref 3.5–5.0)
Alkaline Phosphatase: 60 U/L (ref 38–126)
BUN: 14 mg/dL (ref 6–20)
CALCIUM: 9.6 mg/dL (ref 8.9–10.3)
CHLORIDE: 110 mmol/L (ref 101–111)
CO2: 22 mmol/L (ref 22–32)
CREATININE: 0.7 mg/dL (ref 0.44–1.00)
Glucose, Bld: 149 mg/dL — ABNORMAL HIGH (ref 65–99)
Potassium: 3.6 mmol/L (ref 3.5–5.1)
SODIUM: 144 mmol/L (ref 135–145)
Total Bilirubin: 1.2 mg/dL (ref 0.3–1.2)
Total Protein: 8.5 g/dL — ABNORMAL HIGH (ref 6.5–8.1)

## 2016-06-13 LAB — LIPASE, BLOOD: LIPASE: 29 U/L (ref 11–51)

## 2016-06-13 LAB — PREGNANCY, URINE: Preg Test, Ur: NEGATIVE

## 2016-06-13 MED ORDER — HALOPERIDOL LACTATE 5 MG/ML IJ SOLN
5.0000 mg | Freq: Once | INTRAMUSCULAR | Status: DC
Start: 2016-06-13 — End: 2016-06-13

## 2016-06-13 MED ORDER — SODIUM CHLORIDE 0.9 % IV BOLUS (SEPSIS)
1000.0000 mL | Freq: Once | INTRAVENOUS | Status: AC
  Administered 2016-06-13: 1000 mL via INTRAVENOUS

## 2016-06-13 MED ORDER — HALOPERIDOL LACTATE 5 MG/ML IJ SOLN
2.0000 mg | Freq: Once | INTRAMUSCULAR | Status: AC
  Administered 2016-06-13: 2 mg via INTRAVENOUS
  Filled 2016-06-13: qty 1

## 2016-06-13 MED ORDER — METOCLOPRAMIDE HCL 10 MG PO TABS: 10.0000 mg | ORAL_TABLET | Freq: Three times a day (TID) | ORAL | 0 refills | Status: DC

## 2016-06-13 MED ORDER — DICYCLOMINE HCL 10 MG/ML IM SOLN
20.0000 mg | Freq: Once | INTRAMUSCULAR | Status: AC
  Administered 2016-06-13: 20 mg via INTRAMUSCULAR
  Filled 2016-06-13: qty 2

## 2016-06-13 MED ORDER — GI COCKTAIL ~~LOC~~
30.0000 mL | Freq: Once | ORAL | Status: AC
  Administered 2016-06-13: 30 mL via ORAL
  Filled 2016-06-13: qty 30

## 2016-06-13 MED ORDER — DICYCLOMINE HCL 20 MG PO TABS: 20.0000 mg | ORAL_TABLET | Freq: Three times a day (TID) | ORAL | 0 refills | Status: DC | PRN

## 2016-06-13 MED ORDER — ACETAMINOPHEN 325 MG PO TABS
650.0000 mg | ORAL_TABLET | Freq: Once | ORAL | Status: AC
  Administered 2016-06-13: 650 mg via ORAL
  Filled 2016-06-13: qty 2

## 2016-06-13 MED ORDER — DIPHENHYDRAMINE HCL 25 MG PO CAPS
25.0000 mg | ORAL_CAPSULE | Freq: Once | ORAL | Status: AC
  Administered 2016-06-13: 25 mg via ORAL
  Filled 2016-06-13: qty 1

## 2016-06-13 MED ORDER — PROMETHAZINE HCL 25 MG PO TABS
25.0000 mg | ORAL_TABLET | Freq: Once | ORAL | Status: AC
  Administered 2016-06-13: 25 mg via ORAL
  Filled 2016-06-13: qty 1

## 2016-06-13 MED ORDER — FAMOTIDINE 20 MG PO TABS: 20.0000 mg | ORAL_TABLET | Freq: Two times a day (BID) | ORAL | 0 refills | Status: DC

## 2016-06-13 NOTE — ED Triage Notes (Signed)
Pt brought in ems 20 g to RA, given 4mg  zofran per ems, fsbs 175

## 2016-06-13 NOTE — ED Notes (Signed)
Pt. Writhing around in the bed c/o nausea and vomiting with 10/10 "knawing" abdominal pain. Pt. States last meal about an hour ago and she "threw everything up" in the ambulance en route. Pt. Reports vomiting "too many times to count" in the last 24 hours and "6-7", "I don't know, less than vomiting" diarrheal episodes in the same. Pt. Denies home tx.   Pt. [facility] and they told her "she had too much bile so take a laxative every day. And I've been fine since" When asked if she followed up pt. States "no because I was fine until yesterday"

## 2016-06-13 NOTE — ED Notes (Signed)
Pt. Stating that there is "too much I'm dealing with to answer all these questions.Marland Kitchen.Marland Kitchen.I need to see the doctor quick. Can you tell the doctor to hurry up"

## 2016-06-13 NOTE — ED Provider Notes (Signed)
[facility] Emergency Department Provider Note   ____________________________________________   I have reviewed the triage vital signs and the nursing notes.   HISTORY  Chief Complaint Abdominal Pain   History limited by: Not Limited   HPI Laurie Frederick is a 40 y.o. female resents to the emergency department today because concerns for epigastric pain. Patient has a history of chronic epigastric pain. Patient states that she had been doing fine for roughly the past month then started having pain and vomiting and yesterday. She denies any blood in her vomiting. She states it is of yellow substance. She states that the pain becomes worse after she vomits. She has seen GI and was referred to GI by [facility] last month. She states she did not follow up with them because she had been feeling better. She denies any fevers.    Past Medical History:  Diagnosis Date  . Anemia   . Anxiety   . Depression   . GERD (gastroesophageal reflux disease)   . H pylori ulcer   . Hyperlipidemia   . Multiple gastric ulcers   . Nontoxic uninodular goiter   . Obesity   . PTSD (post-traumatic stress disorder)   . Sleep apnea   . Vitamin D deficiency     Patient Active Problem List   Diagnosis Date Noted  . Overdose 04/11/2016  . Abdominal pain   . Anxiety   . Uncontrollable vomiting   . UTI (lower urinary tract infection) 03/13/2016  . Abdominal pain, epigastric   . Gastritis   . Hypokalemia 08/16/2015  . Chronic prescription benzodiazepine use 08/16/2015  . Intractable nausea and vomiting 07/22/2015  . Epigastric pain   . Helicobacter positive gastritis 07/16/2015  . Gastritis and gastroduodenitis 07/16/2015  . Hyperlipidemia 07/13/2015  . Vitamin D deficiency 07/13/2015  . Thyroid nodule 07/13/2015  . Polyp of gallbladder 07/13/2015  . Liver nodule 07/13/2015  . Nausea & vomiting 07/13/2015  . Gastroenteritis 07/06/2015  . Moderate major depression (HCC)  04/01/2015  . Alcohol abuse 04/01/2015    Past Surgical History:  Procedure Laterality Date  . CESAREAN SECTION    . CESAREAN SECTION    . DILATION AND CURETTAGE OF UTERUS     x 2 TAB  . ESOPHAGOGASTRODUODENOSCOPY (EGD) WITH PROPOFOL Left 07/07/2015   Procedure: ESOPHAGOGASTRODUODENOSCOPY (EGD) WITH PROPOFOL;  Surgeon: Scot Junobert T Elliott, MD;  [facility];  Service: Endoscopy;  Laterality: Left;  . ESOPHAGOGASTRODUODENOSCOPY (EGD) WITH PROPOFOL N/A 09/05/2015   Procedure: ESOPHAGOGASTRODUODENOSCOPY (EGD) WITH PROPOFOL;  Surgeon: Midge Miniumarren Wohl, MD;  Location: [facility] ENDOSCOPY;  Service: Endoscopy;  Laterality: N/A;  . ESOPHAGOGASTRODUODENOSCOPY (EGD) WITH PROPOFOL N/A 03/15/2016   Procedure: ESOPHAGOGASTRODUODENOSCOPY (EGD) WITH PROPOFOL;  Surgeon: Midge Miniumarren Wohl, MD;  Location: [facility] ENDOSCOPY;  Service: Endoscopy;  Laterality: N/A;    Prior to Admission medications   Medication Sig Start Date End Date Taking? Authorizing Provider  ALPRAZolam (XANAX) 0.25 MG tablet Take 1 tablet (0.25 mg total) by mouth every 8 (eight) hours as needed for anxiety. 05/06/16   Lorre NickAnthony Allen, MD  buPROPion (WELLBUTRIN XL) 150 MG 24 hr tablet Take 150 mg by mouth daily.    Historical Provider, MD  citalopram (CELEXA) 40 MG tablet Take 40 mg by mouth at bedtime.    Historical Provider, MD  lisinopril (PRINIVIL,ZESTRIL) 20 MG tablet Take 20 mg by mouth daily.    Historical Provider, MD  metoCLOPramide (REGLAN) 10 MG tablet Take 1 tablet (10 mg total) by mouth every 8 (eight) hours as  needed for nausea or vomiting. 04/16/16   Sharyn CreamerMark Quale, MD  omeprazole (PRILOSEC) 20 MG capsule Take 20 mg by mouth 2 (two) times daily before a meal.    Historical Provider, MD  pantoprazole (PROTONIX) 20 MG tablet Take 20 mg by mouth 2 (two) times daily.    Historical Provider, MD  potassium chloride (K-DUR) 10 MEQ tablet Take 1 tablet (10 mEq total) by mouth daily. 04/16/16   Sharyn CreamerMark Quale, MD  potassium chloride SA (KLOR-CON M20) 20 MEQ  tablet Take 1 tablet (20 mEq total) by mouth daily. 05/05/16   Loleta Roseory Forbach, MD  promethazine (PHENERGAN) 25 MG suppository Place 1 suppository (25 mg total) rectally every 6 (six) hours as needed for nausea. 05/05/16 05/05/17  Loleta Roseory Forbach, MD  QUEtiapine (SEROQUEL) 50 MG tablet Take 100 mg by mouth at bedtime.    Historical Provider, MD  sucralfate (CARAFATE) 1 g tablet Take 1 tablet (1 g total) by mouth 4 (four) times daily -  with meals and at bedtime. 04/08/16   Jene Everyobert Kinner, MD  traMADol (ULTRAM) 50 MG tablet Take 1 tablet (50 mg total) by mouth every 6 (six) hours as needed. 05/06/16   Lorre NickAnthony Allen, MD  zolpidem (AMBIEN) 10 MG tablet Take 10 mg by mouth at bedtime.    Historical Provider, MD    Allergies Review of patient's allergies indicates no known allergies.  Family History  Problem Relation Age of Onset  . Lung cancer Father   . Cancer Father     lung cancer  . Alcohol abuse Father   . Depression Mother   . Anxiety disorder Mother   . Arthritis Mother   . Hyperlipidemia Mother   . Asthma Son   . Lung cancer Paternal Aunt   . Cancer Paternal Aunt     lung  . Bone cancer Paternal Uncle   . Cancer Paternal Uncle     pancreatic  . Hypertension Maternal Grandmother   . Diabetes Paternal Grandmother   . Heart disease Paternal Grandmother   . Hypertension Paternal Grandmother   . Vision loss Neg Hx   . COPD Neg Hx     Social History Social History  Substance Use Topics  . Smoking status: Current Some Day Smoker    Packs/day: 1.00    Years: 17.00    Types: Cigarettes  . Smokeless tobacco: Never Used  . Alcohol use No     Comment: occasional    Review of Systems  Constitutional: Negative for fever. Cardiovascular: Negative for chest pain. Respiratory: Negative for shortness of breath. Gastrointestinal: Positive for abdominal pain and vomiting Neurological: Negative for headaches, focal weakness or numbness.   10-point ROS otherwise  negative.  ____________________________________________   PHYSICAL EXAM:  VITAL SIGNS: ED Triage Vitals [06/13/16 0514]  Enc Vitals Group     BP (!) 160/100     Pulse Rate 86     Resp 18     Temp 98.3 F (36.8 C)     Temp Source Oral     SpO2 99 %     Weight 160 lb (72.6 kg)     Height 5\' 2"  (1.575 m)     Head Circumference      Peak Flow      Pain Score 10   Constitutional: Alert and oriented. Appears slightly uncomfortable and anxious Eyes: Conjunctivae are normal. PERRL. Normal extraocular movements. ENT   Head: Normocephalic and atraumatic.   Nose: No congestion/rhinnorhea.   Mouth/Throat: Mucous membranes are moist.  Neck: No stridor. Hematological/Lymphatic/Immunilogical: No cervical lymphadenopathy. Cardiovascular: Normal rate, regular rhythm.  No murmurs, rubs, or gallops. Respiratory: Normal respiratory effort without tachypnea nor retractions. Breath sounds are clear and equal bilaterally. No wheezes/rales/rhonchi. Gastrointestinal: Soft and mildly tender to palpation over the epigastrium Genitourinary: Deferred Musculoskeletal: Normal range of motion in all extremities. No joint effusions.  No lower extremity tenderness nor edema. Neurologic:  Normal speech and language. No gross focal neurologic deficits are appreciated.  Skin:  Skin is warm, dry and intact. No rash noted.   ____________________________________________    LABS (pertinent positives/negatives)  Labs Reviewed  CBC - Abnormal; Notable for the following:       Result Value   WBC 11.8 (*)    RDW 15.8 (*)    All other components within normal limits  COMPREHENSIVE METABOLIC PANEL - Abnormal; Notable for the following:    Glucose, Bld 149 (*)    Total Protein 8.5 (*)    All other components within normal limits  URINALYSIS COMPLETEWITH MICROSCOPIC ([facility] ONLY) - Abnormal; Notable for the following:    Color, Urine YELLOW (*)    APPearance HAZY (*)    Glucose, UA 50 (*)     Ketones, ur 2+ (*)    Specific Gravity, Urine 1.031 (*)    Hgb urine dipstick 1+ (*)    Protein, ur 100 (*)    Leukocytes, UA 2+ (*)    Squamous Epithelial / LPF 6-30 (*)    All other components within normal limits  LIPASE, BLOOD  PREGNANCY, URINE     ____________________________________________   EKG  None  ____________________________________________    RADIOLOGY  None   ____________________________________________   PROCEDURES  Procedures  ____________________________________________   INITIAL IMPRESSION / ASSESSMENT AND PLAN / ED COURSE  Pertinent labs & imaging results that were available during my care of the patient were reviewed by me and considered in my medical decision making (see chart for details).  Patient presents to the emergency department today with history of chronic abdominal pain and vomiting. On exam patient does look somewhat uncomfortable. Will try nonnarcotic management of the patient's symptoms. Did have a long discussion with patient about chronic pain management in appropriateness of emergency department.  ____________________________________________   FINAL CLINICAL IMPRESSION(S) / ED DIAGNOSES  Abdominal pain  Note: This dictation was prepared with Dragon dictation. Any transcriptional errors that result from this process are unintentional    Phineas Semen, MD 06/13/16 [phone]

## 2016-06-13 NOTE — ED Notes (Signed)
Pt. to nurse desk to ask for pain medication and states that this RN is ignoring her complaints. This RN explained to the pt. That administering medications without a MD order per pt. Request is not in my scope of practice and not covered by license. Pt. Reassured that the doctor will be in to see her as urgently as possible and that he has been informed of her complaints and concerns.

## 2016-06-13 NOTE — ED Notes (Signed)
Pt. To doorway of room asking to see MD. MD will be notified as soon as return from another pt. Room.

## 2016-06-13 NOTE — ED Provider Notes (Signed)
 -----------------------------------------   9:03 AM on 06/13/2016 -----------------------------------------  Patient reassessed. Sleeping comfortably. No vomiting, no pain. Symptoms controlled. We'll discharge home, follow-up with primary care in 1 day.   Sharman CheekPhillip Hortense Cantrall, MD 06/13/16 [phone]

## 2016-06-13 NOTE — ED Notes (Signed)
MD at bedside. 

## 2016-06-13 NOTE — ED Triage Notes (Signed)
Pt brought in by ems from home with co upper abd pain and vomiting since yest. No diarrhea hx of the same, [facility] and told " I have too much bile".

## 2016-09-19 ENCOUNTER — Encounter: Payer: Self-pay | Admitting: Emergency Medicine

## 2016-09-19 ENCOUNTER — Emergency Department: Payer: Medicaid Other

## 2016-09-19 ENCOUNTER — Emergency Department
Admission: EM | Admit: 2016-09-19 | Discharge: 2016-09-19 | Disposition: A | Discharge status: Home / Self Care | Payer: Medicaid Other | Attending: Emergency Medicine | Admitting: Emergency Medicine

## 2016-09-19 DIAGNOSIS — R229 Localized swelling, mass and lump, unspecified: Secondary | ICD-10-CM | POA: Diagnosis present

## 2016-09-19 DIAGNOSIS — R2231 Localized swelling, mass and lump, right upper limb: Secondary | ICD-10-CM

## 2016-09-19 DIAGNOSIS — R590 Localized enlarged lymph nodes: Secondary | ICD-10-CM | POA: Diagnosis not present

## 2016-09-19 DIAGNOSIS — Z79899 Other long term (current) drug therapy: Secondary | ICD-10-CM | POA: Insufficient documentation

## 2016-09-19 DIAGNOSIS — F17210 Nicotine dependence, cigarettes, uncomplicated: Secondary | ICD-10-CM | POA: Insufficient documentation

## 2016-09-19 LAB — CBC WITH DIFFERENTIAL/PLATELET
BASOS PCT: 2 %
Basophils Absolute: 0.1 10*3/uL (ref 0–0.1)
EOS ABS: 0.5 10*3/uL (ref 0–0.7)
Eosinophils Relative: 10 %
HEMATOCRIT: 37.1 % (ref 35.0–47.0)
HEMOGLOBIN: 12.4 g/dL (ref 12.0–16.0)
LYMPHS ABS: 1.9 10*3/uL (ref 1.0–3.6)
Lymphocytes Relative: 35 %
MCH: 28.9 pg (ref 26.0–34.0)
MCHC: 33.5 g/dL (ref 32.0–36.0)
MCV: 86.3 fL (ref 80.0–100.0)
MONOS PCT: 9 %
Monocytes Absolute: 0.5 10*3/uL (ref 0.2–0.9)
NEUTROS ABS: 2.4 10*3/uL (ref 1.4–6.5)
NEUTROS PCT: 44 %
Platelets: 339 10*3/uL (ref 150–440)
RBC: 4.29 MIL/uL (ref 3.80–5.20)
RDW: 15.6 % — ABNORMAL HIGH (ref 11.5–14.5)
WBC: 5.5 10*3/uL (ref 3.6–11.0)

## 2016-09-19 LAB — BASIC METABOLIC PANEL
Anion gap: 8 (ref 5–15)
BUN: 12 mg/dL (ref 6–20)
CHLORIDE: 102 mmol/L (ref 101–111)
CO2: 28 mmol/L (ref 22–32)
Calcium: 9.5 mg/dL (ref 8.9–10.3)
Creatinine, Ser: 0.64 mg/dL (ref 0.44–1.00)
GFR calc non Af Amer: >60 mL/min (ref >60)
Glucose, Bld: 88 mg/dL (ref 65–99)
POTASSIUM: 4.2 mmol/L (ref 3.5–5.1)
SODIUM: 138 mmol/L (ref 135–145)

## 2016-09-19 NOTE — ED Notes (Signed)
MD at bedside to review results and instruct on follow-up care. Patient refused to wait for discharge papers but patient verbalized understanding of instructions. All questions answered by MD.

## 2016-09-19 NOTE — Discharge Instructions (Signed)
As I explained to you your ultrasound show an enlarged lymph node. This could be due to an infection or possible cancer. It it is very important that you follow up with her primary care doctor if the mass has not resolved by the beginning of next week for further evaluation to rule out cancer.

## 2016-09-19 NOTE — ED Triage Notes (Signed)
Patient ambulatory to triage with steady gait, without difficulty or distress noted; pt reports "knot" to right axillae x 3wks

## 2016-09-19 NOTE — ED Provider Notes (Signed)
[facility] Emergency Department Provider Note  ____________________________________________  Time seen: Approximately 7:20 AM  I have reviewed the triage vital signs and the nursing notes.   HISTORY  Chief Complaint No chief complaint on file.   HPI Geronimo RunningSamantha Lee Yamashiro is a 40 y.o. female with a history of anemia, anxiety, depression, peptic ulcer disease who presents for evaluation of a right axillary mass. Patient reports that she first noticed it 3 weeks ago. She reports that has been growing. She reports mild pain that is worse with palpation of the axillary mass located over the mass, constant, non radiating. She also endorses chills and body aches associated with it. She denies any prior history of skin abscesses. She denies fever, nausea, vomiting, chest pain, shortness of breath, personal or family history of breast cancer, discharge from her breasts, or palpable masses in her breasts.  Past Medical History:  Diagnosis Date  . Anemia   . Anxiety   . Depression   . GERD (gastroesophageal reflux disease)   . H pylori ulcer   . Hyperlipidemia   . Multiple gastric ulcers   . Nontoxic uninodular goiter   . Obesity   . PTSD (post-traumatic stress disorder)   . Sleep apnea   . Vitamin D deficiency     Patient Active Problem List   Diagnosis Date Noted  . Overdose 04/11/2016  . Abdominal pain   . Anxiety   . Uncontrollable vomiting   . UTI (lower urinary tract infection) 03/13/2016  . Abdominal pain, epigastric   . Gastritis   . Hypokalemia 08/16/2015  . Chronic prescription benzodiazepine use 08/16/2015  . Intractable nausea and vomiting 07/22/2015  . Epigastric pain   . Helicobacter positive gastritis 07/16/2015  . Gastritis and gastroduodenitis 07/16/2015  . Hyperlipidemia 07/13/2015  . Vitamin D deficiency 07/13/2015  . Thyroid nodule 07/13/2015  . Polyp of gallbladder 07/13/2015  . Liver nodule 07/13/2015  . Nausea & vomiting  07/13/2015  . Gastroenteritis 07/06/2015  . 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 Alcohol abuse 04/01/2015    Past Surgical History:  Procedure Laterality Date  . CESAREAN SECTION    . CESAREAN SECTION    . DILATION AND CURETTAGE OF UTERUS     x 2 TAB  . ESOPHAGOGASTRODUODENOSCOPY (EGD) WITH PROPOFOL Left 07/07/2015   Procedure: ESOPHAGOGASTRODUODENOSCOPY (EGD) WITH PROPOFOL;  Surgeon: Scot Junobert T Elliott, MD;  [facility];  Service: Endoscopy;  Laterality: Left;  . ESOPHAGOGASTRODUODENOSCOPY (EGD) WITH PROPOFOL N/A 09/05/2015   Procedure: ESOPHAGOGASTRODUODENOSCOPY (EGD) WITH PROPOFOL;  Surgeon: Midge Miniumarren Wohl, MD;  Location: [facility] ENDOSCOPY;  Service: Endoscopy;  Laterality: N/A;  . ESOPHAGOGASTRODUODENOSCOPY (EGD) WITH PROPOFOL N/A 03/15/2016   Procedure: ESOPHAGOGASTRODUODENOSCOPY (EGD) WITH PROPOFOL;  Surgeon: Midge Miniumarren Wohl, MD;  Location: [facility] ENDOSCOPY;  Service: Endoscopy;  Laterality: N/A;    Prior to Admission medications   Medication Sig Start Date End Date Taking? Authorizing Provider  ALPRAZolam (XANAX) 0.25 MG tablet Take 1 tablet (0.25 mg total) by mouth every 8 (eight) hours as needed for anxiety. 05/06/16   Lorre NickAnthony Allen, MD  buPROPion (WELLBUTRIN XL) 150 MG 24 hr tablet Take 150 mg by mouth daily.    Historical Provider, MD  citalopram (CELEXA) 40 MG tablet Take 40 mg by mouth at bedtime.    Historical Provider, MD  dicyclomine (BENTYL) 20 MG tablet Take 1 tablet (20 mg total) by mouth 3 (three) times daily as needed for spasms. 06/13/16   Sharman CheekPhillip Stafford, MD  famotidine (PEPCID) 20 MG tablet  Take 1 tablet (20 mg total) by mouth 2 (two) times daily. 06/13/16   Sharman Cheek, MD  lisinopril (PRINIVIL,ZESTRIL) 20 MG tablet Take 20 mg by mouth daily.    Historical Provider, MD  metoCLOPramide (REGLAN) 10 MG tablet Take 1 tablet (10 mg total) by mouth 4 (four) times daily -  before meals and at bedtime. 06/13/16   Sharman Cheek, MD  omeprazole (PRILOSEC) 20 MG  capsule Take 20 mg by mouth 2 (two) times daily before a meal.    Historical Provider, MD  pantoprazole (PROTONIX) 20 MG tablet Take 20 mg by mouth 2 (two) times daily.    Historical Provider, MD  potassium chloride (K-DUR) 10 MEQ tablet Take 1 tablet (10 mEq total) by mouth daily. 04/16/16   Sharyn Creamer, MD  potassium chloride SA (KLOR-CON M20) 20 MEQ tablet Take 1 tablet (20 mEq total) by mouth daily. 05/05/16   Loleta Rose, MD  promethazine (PHENERGAN) 25 MG suppository Place 1 suppository (25 mg total) rectally every 6 (six) hours as needed for nausea. 05/05/16 05/05/17  Loleta Rose, MD  QUEtiapine (SEROQUEL) 50 MG tablet Take 100 mg by mouth at bedtime.    Historical Provider, MD  sucralfate (CARAFATE) 1 g tablet Take 1 tablet (1 g total) by mouth 4 (four) times daily -  with meals and at bedtime. 04/08/16   Jene Every, MD  traMADol (ULTRAM) 50 MG tablet Take 1 tablet (50 mg total) by mouth every 6 (six) hours as needed. 05/06/16   Lorre Nick, MD  zolpidem (AMBIEN) 10 MG tablet Take 10 mg by mouth at bedtime.    Historical Provider, MD    Allergies Patient has no known allergies.  Family History  Problem Relation Age of Onset  . Lung cancer Father   . Cancer Father     lung cancer  . Alcohol abuse Father   . Depression Mother   . Anxiety disorder Mother   . Arthritis Mother   . Hyperlipidemia Mother   . Asthma Son   . Lung cancer Paternal Aunt   . Cancer Paternal Aunt     lung  . Bone cancer Paternal Uncle   . Cancer Paternal Uncle     pancreatic  . Hypertension Maternal Grandmother   . Diabetes Paternal Grandmother   . Heart disease Paternal Grandmother   . Hypertension Paternal Grandmother   . Vision loss Neg Hx   . COPD Neg Hx     Social History Social History  Substance Use Topics  . Smoking status: Current Every Day Smoker    Packs/day: 1.00    Years: 17.00    Types: Cigarettes  . Smokeless tobacco: Never Used  . Alcohol use No     Comment: occasional     Review of Systems  Constitutional: Negative for fever. Eyes: Negative for visual changes. ENT: Negative for sore throat. Neck: No neck pain  Cardiovascular: Negative for chest pain. Respiratory: Negative for shortness of breath. Gastrointestinal: Negative for abdominal pain, vomiting or diarrhea. Genitourinary: Negative for dysuria. Musculoskeletal: Negative for back pain. + mass on R axilla Skin: Negative for rash. Neurological: Negative for headaches, weakness or numbness. Psych: No SI or HI  ____________________________________________   PHYSICAL EXAM:  VITAL SIGNS: ED Triage Vitals  Enc Vitals Group     BP 09/19/16 0618 118/76     Pulse Rate 09/19/16 0618 94     Resp 09/19/16 0618 18     Temp 09/19/16 0618 98.7 F (37.1 C)  Temp Source 09/19/16 0618 Oral     SpO2 09/19/16 0618 98 %     Weight 09/19/16 0616 170 lb (77.1 kg)     Height 09/19/16 0616 5\' 2"  (1.575 m)     Head Circumference --      Peak Flow --      Pain Score 09/19/16 0617 6     Pain Loc --      Pain Edu? --      Excl. in GC? --     Constitutional: Alert and oriented. Well appearing and in no apparent distress. HEENT:      Head: Normocephalic and atraumatic.         Eyes: Conjunctivae are normal. Sclera is non-icteric. EOMI. PERRL      Mouth/Throat: Mucous membranes are moist.       Neck: Supple with no signs of meningismus. Cardiovascular: Regular rate and rhythm. No murmurs, gallops, or rubs. 2+ symmetrical distal pulses are present in all extremities. No JVD. Respiratory: Normal respiratory effort. Lungs are clear to auscultation bilaterally. No wheezes, crackles, or rhonchi.  Breast: bilateral breast exam is normal with no masses, no discharge Musculoskeletal: There is a 2.0cm mobile mass palpable on the R axillary region with no overlying erythema or warmth. Neurologic: Normal speech and language. Face is symmetric. Moving all extremities. No gross focal neurologic deficits are  appreciated. Skin: Skin is warm, dry and intact. No rash noted. Psychiatric: Mood and affect are normal. Speech and behavior are normal.  ____________________________________________   LABS (all labs ordered are listed, but only abnormal results are displayed)  Labs Reviewed  CBC WITH DIFFERENTIAL/PLATELET - Abnormal; Notable for the following:       Result Value   RDW 15.6 (*)    All other components within normal limits  BASIC METABOLIC PANEL   ____________________________________________  EKG  none  ____________________________________________  RADIOLOGY  [place]: Two low echogenicity solid lesions in the right axilla corresponding to the location of the patient's discomfort have an imaging appearance most compatible with adenopathy. This could certainly be reactive (particularly in light of the tenderness, which is more commonly associated with reactive rather than malignant lymph nodes) but if it fails to resolve, diagnostic mammographic workup of the right breast and/or tissue sampling of these nodes may be warranted. ____________________________________________   PROCEDURES  Procedure(s) performed: None Procedures Critical Care performed:  None ____________________________________________   INITIAL IMPRESSION / ASSESSMENT AND PLAN / ED COURSE  40 y.o. female with a history of anemia, anxiety, depression, peptic ulcer disease who presents for evaluation of a right axillary mass x 3 weeks associated with chills and body aches. Patient is well-appearing, in no distress, vital signs are within normal limits, there is a 2 cm mobile mass palpable on the right axillary region with no overlying erythema or warmth concerning for possible lymphadenopathy versus a deep abscess. Breast exam with no evidence of mass or discharge. We'll get an ultrasound. We'll check basic blood work.  Clinical Course as of Sep 20 1500  Tue Sep 19, 2016  0930 [place] concerning for lymphadenopathy.  Labs and VS wnl.Patient instructed to f/u with PCP if lymphadenopathy does not improve within the next week for further evaluation including mammogram and/or possible biopsy to rule out malignancy.  [CV]    Clinical Course User Index [CV] Nita Sickle, MD    Pertinent labs & imaging results that were available during my care of the patient were reviewed by me and considered in my medical decision  making (see chart for details).    ____________________________________________   FINAL CLINICAL IMPRESSION(S) / ED DIAGNOSES  Final diagnoses:  Mass of right axilla  Axillary lymphadenopathy      NEW MEDICATIONS STARTED DURING THIS VISIT:  Discharge Medication List as of 09/19/2016  9:32 AM       Note:  This document was prepared using Dragon voice recognition software and may include unintentional dictation errors.    Nita Sicklearolina Ardean Melroy, MD 09/20/16 1501

## 2016-11-08 ENCOUNTER — Emergency Department
Admission: EM | Admit: 2016-11-08 | Discharge: 2016-11-08 | Disposition: A | Discharge status: Home / Self Care | Payer: Medicaid Other | Attending: Emergency Medicine | Admitting: Emergency Medicine

## 2016-11-08 ENCOUNTER — Encounter: Payer: Self-pay | Admitting: *Deleted

## 2016-11-08 DIAGNOSIS — S161XXA Strain of muscle, fascia and tendon at neck level, initial encounter: Secondary | ICD-10-CM | POA: Diagnosis not present

## 2016-11-08 DIAGNOSIS — F17210 Nicotine dependence, cigarettes, uncomplicated: Secondary | ICD-10-CM | POA: Diagnosis not present

## 2016-11-08 DIAGNOSIS — S199XXA Unspecified injury of neck, initial encounter: Secondary | ICD-10-CM | POA: Diagnosis present

## 2016-11-08 DIAGNOSIS — Y9389 Activity, other specified: Secondary | ICD-10-CM | POA: Diagnosis not present

## 2016-11-08 DIAGNOSIS — Y92410 Unspecified street and highway as the place of occurrence of the external cause: Secondary | ICD-10-CM | POA: Diagnosis not present

## 2016-11-08 DIAGNOSIS — Y999 Unspecified external cause status: Secondary | ICD-10-CM | POA: Insufficient documentation

## 2016-11-08 DIAGNOSIS — M7918 Myalgia, other site: Secondary | ICD-10-CM

## 2016-11-08 MED ORDER — KETOROLAC TROMETHAMINE 60 MG/2ML IM SOLN
60.0000 mg | Freq: Once | INTRAMUSCULAR | Status: AC
  Administered 2016-11-08: 60 mg via INTRAMUSCULAR

## 2016-11-08 MED ORDER — KETOROLAC TROMETHAMINE 60 MG/2ML IM SOLN
INTRAMUSCULAR | Status: AC
  Filled 2016-11-08: qty 2

## 2016-11-08 MED ORDER — METHOCARBAMOL 750 MG PO TABS: 750.0000 mg | ORAL_TABLET | Freq: Four times a day (QID) | ORAL | 0 refills | Status: DC

## 2016-11-08 MED ORDER — KETOROLAC TROMETHAMINE 10 MG PO TABS: 10.0000 mg | ORAL_TABLET | Freq: Four times a day (QID) | ORAL | 0 refills | Status: DC | PRN

## 2016-11-08 MED ORDER — ORPHENADRINE CITRATE 30 MG/ML IJ SOLN
INTRAMUSCULAR | Status: AC
  Filled 2016-11-08: qty 2

## 2016-11-08 MED ORDER — ORPHENADRINE CITRATE 30 MG/ML IJ SOLN
60.0000 mg | Freq: Two times a day (BID) | INTRAMUSCULAR | Status: DC
  Administered 2016-11-08: 60 mg via INTRAMUSCULAR

## 2016-11-08 NOTE — ED Notes (Signed)
Pt smacking gum, playing on phone.
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
## 2016-11-08 NOTE — ED Notes (Signed)
Pt walking around in hallway, went over DC papers, mom in lobby waiting for pt.
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
## 2017-03-29 ENCOUNTER — Encounter: Payer: Self-pay | Admitting: Emergency Medicine

## 2017-03-29 DIAGNOSIS — F17210 Nicotine dependence, cigarettes, uncomplicated: Secondary | ICD-10-CM | POA: Diagnosis not present

## 2017-03-29 DIAGNOSIS — Z79899 Other long term (current) drug therapy: Secondary | ICD-10-CM | POA: Diagnosis not present

## 2017-03-29 DIAGNOSIS — Z5321 Procedure and treatment not carried out due to patient leaving prior to being seen by health care provider: Secondary | ICD-10-CM | POA: Insufficient documentation

## 2017-03-29 DIAGNOSIS — G4753 Recurrent isolated sleep paralysis: Secondary | ICD-10-CM | POA: Diagnosis not present

## 2017-03-29 DIAGNOSIS — F419 Anxiety disorder, unspecified: Secondary | ICD-10-CM | POA: Diagnosis not present

## 2017-03-29 NOTE — ED Triage Notes (Signed)
Pt comes into the ED via POV c/o panic attacks that have increased recently.  Patient denies any SI or HI and states she catches herself having trembling moments.  Patient not trembling at this time, and in NAd with even and unlabored respirations.  Patient explains she has had an increase in stress recently.  Patient cant get her medications currently due to no insurance.

## 2017-03-30 ENCOUNTER — Encounter: Payer: Self-pay | Admitting: Emergency Medicine

## 2017-03-30 ENCOUNTER — Emergency Department
Admission: EM | Admit: 2017-03-30 | Discharge: 2017-03-30 | Disposition: A | Discharge status: Home / Self Care | Payer: Medicaid Other | Source: Home / Self Care

## 2017-03-30 ENCOUNTER — Emergency Department
Admission: EM | Admit: 2017-03-30 | Discharge: 2017-03-30 | Disposition: A | Discharge status: Home / Self Care | Payer: Medicaid Other | Attending: Emergency Medicine | Admitting: Emergency Medicine

## 2017-03-30 DIAGNOSIS — G478 Other sleep disorders: Secondary | ICD-10-CM

## 2017-03-30 DIAGNOSIS — F17210 Nicotine dependence, cigarettes, uncomplicated: Secondary | ICD-10-CM | POA: Insufficient documentation

## 2017-03-30 DIAGNOSIS — G4753 Recurrent isolated sleep paralysis: Secondary | ICD-10-CM | POA: Insufficient documentation

## 2017-03-30 DIAGNOSIS — F419 Anxiety disorder, unspecified: Secondary | ICD-10-CM | POA: Insufficient documentation

## 2017-03-30 DIAGNOSIS — Z79899 Other long term (current) drug therapy: Secondary | ICD-10-CM | POA: Insufficient documentation

## 2017-03-30 LAB — URINE DRUG SCREEN, QUALITATIVE (ARMC ONLY)
Amphetamines, Ur Screen: NOT DETECTED
BARBITURATES, UR SCREEN: NOT DETECTED
Benzodiazepine, Ur Scrn: POSITIVE — AB
CANNABINOID 50 NG, UR ~~LOC~~: NOT DETECTED
COCAINE METABOLITE, UR ~~LOC~~: NOT DETECTED
MDMA (Ecstasy)Ur Screen: NOT DETECTED
METHADONE SCREEN, URINE: NOT DETECTED
Opiate, Ur Screen: NOT DETECTED
Phencyclidine (PCP) Ur S: NOT DETECTED
TRICYCLIC, UR SCREEN: NOT DETECTED

## 2017-03-30 LAB — CBC
HCT: 39.1 % (ref 35.0–47.0)
HEMOGLOBIN: 13.3 g/dL (ref 12.0–16.0)
MCH: 30.6 pg (ref 26.0–34.0)
MCHC: 34 g/dL (ref 32.0–36.0)
MCV: 90.1 fL (ref 80.0–100.0)
PLATELETS: 286 10*3/uL (ref 150–440)
RBC: 4.34 MIL/uL (ref 3.80–5.20)
RDW: 15.1 % — ABNORMAL HIGH (ref 11.5–14.5)
WBC: 7.5 10*3/uL (ref 3.6–11.0)

## 2017-03-30 LAB — URINALYSIS, COMPLETE (UACMP) WITH MICROSCOPIC
BILIRUBIN URINE: NEGATIVE
GLUCOSE, UA: NEGATIVE mg/dL
Ketones, ur: NEGATIVE mg/dL
NITRITE: NEGATIVE
PH: 7 (ref 5.0–8.0)
Protein, ur: NEGATIVE mg/dL
SPECIFIC GRAVITY, URINE: 1.02 (ref 1.005–1.030)

## 2017-03-30 LAB — COMPREHENSIVE METABOLIC PANEL
ALK PHOS: 70 U/L (ref 38–126)
ALT: 12 U/L — ABNORMAL LOW (ref 14–54)
ANION GAP: 8 (ref 5–15)
AST: 19 U/L (ref 15–41)
Albumin: 4.8 g/dL (ref 3.5–5.0)
BILIRUBIN TOTAL: 0.6 mg/dL (ref 0.3–1.2)
BUN: 7 mg/dL (ref 6–20)
CALCIUM: 9.5 mg/dL (ref 8.9–10.3)
CO2: 28 mmol/L (ref 22–32)
Chloride: 102 mmol/L (ref 101–111)
Creatinine, Ser: 0.73 mg/dL (ref 0.44–1.00)
GFR calc Af Amer: >60 mL/min (ref >60)
GLUCOSE: 106 mg/dL — AB (ref 65–99)
POTASSIUM: 3.8 mmol/L (ref 3.5–5.1)
Sodium: 138 mmol/L (ref 135–145)
TOTAL PROTEIN: 8 g/dL (ref 6.5–8.1)

## 2017-03-30 LAB — ETHANOL

## 2017-03-30 MED ORDER — ZOLPIDEM TARTRATE 10 MG PO TABS: 10.0000 mg | ORAL_TABLET | Freq: Every day | ORAL | 1 refills | Status: DC

## 2017-03-30 MED ORDER — CITALOPRAM HYDROBROMIDE 40 MG PO TABS: 40.0000 mg | ORAL_TABLET | Freq: Every day | ORAL | 1 refills | Status: DC

## 2017-03-30 MED ORDER — DIAZEPAM 5 MG PO TABS
10.0000 mg | ORAL_TABLET | Freq: Once | ORAL | Status: AC
  Administered 2017-03-30: 10 mg via ORAL
  Filled 2017-03-30: qty 2

## 2017-03-30 NOTE — ED Notes (Signed)
ED Provider at bedside. 

## 2017-03-30 NOTE — ED Triage Notes (Signed)
Says she is unable to sleep due to anxiety.  Says when she does sleep she cant move when seh wakes up.

## 2017-03-30 NOTE — ED Notes (Signed)
Pt called from lobby with no response. Unable to locate pt at this time.

## 2017-03-30 NOTE — ED Notes (Signed)
POC Urine pregnancy test is negative.  Meter would not work to get this in the labs on chart.

## 2017-03-30 NOTE — ED Provider Notes (Signed)
[facility] Emergency Department Provider Note       Time seen: ----------------------------------------- 4:34 PM on 03/30/2017 -----------------------------------------     I have reviewed the triage vital signs and the nursing notes.   HISTORY   Chief Complaint Anxiety    HPI Laurie Frederick is a 41 y.o. female who presents to the ED for difficulty sleeping due to anxiety. Patient states there are episodes where it feels like she wakes up but she is paralyzed and cannot move. Patient thinks this is triggered by her anxiety. She reports increased stress recently, they have been evicted and has had less income. She denies any recent illness but feels very anxious.   Past Medical History:  Diagnosis Date  . Anemia   . Anxiety   . Depression   . GERD (gastroesophageal reflux disease)   . H pylori ulcer   . Hyperlipidemia   . Multiple gastric ulcers   . Nontoxic uninodular goiter   . Obesity   . PTSD (post-traumatic stress disorder)   . Sleep apnea   . Vitamin D deficiency     Patient Active Problem List   Diagnosis Date Noted  . Overdose 04/11/2016  . Abdominal pain   . Anxiety   . Uncontrollable vomiting   . UTI (lower urinary tract infection) 03/13/2016  . Abdominal pain, epigastric   . Gastritis   . Hypokalemia 08/16/2015  . Chronic prescription benzodiazepine use 08/16/2015  . Intractable nausea and vomiting 07/22/2015  . Epigastric pain   . Helicobacter positive gastritis 07/16/2015  . Gastritis and gastroduodenitis 07/16/2015  . Hyperlipidemia 07/13/2015  . Vitamin D deficiency 07/13/2015  . Thyroid nodule 07/13/2015  . Polyp of gallbladder 07/13/2015  . Liver nodule 07/13/2015  . Nausea & vomiting 07/13/2015  . Gastroenteritis 07/06/2015  . 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 Alcohol abuse 04/01/2015    Past Surgical History:  Procedure Laterality Date  . CESAREAN SECTION    . CESAREAN SECTION    .  DILATION AND CURETTAGE OF UTERUS     x 2 TAB  . ESOPHAGOGASTRODUODENOSCOPY (EGD) WITH PROPOFOL Left 07/07/2015   Procedure: ESOPHAGOGASTRODUODENOSCOPY (EGD) WITH PROPOFOL;  Surgeon: Scot Jun, MD;  Location: [facility] ENDOSCOPY;  Service: Endoscopy;  Laterality: Left;  . ESOPHAGOGASTRODUODENOSCOPY (EGD) WITH PROPOFOL N/A 09/05/2015   Procedure: ESOPHAGOGASTRODUODENOSCOPY (EGD) WITH PROPOFOL;  Surgeon: Midge Minium, MD;  Location: [facility] ENDOSCOPY;  Service: Endoscopy;  Laterality: N/A;  . ESOPHAGOGASTRODUODENOSCOPY (EGD) WITH PROPOFOL N/A 03/15/2016   Procedure: ESOPHAGOGASTRODUODENOSCOPY (EGD) WITH PROPOFOL;  Surgeon: Midge Minium, MD;  Location: [facility] ENDOSCOPY;  Service: Endoscopy;  Laterality: N/A;    Allergies Patient has no known allergies.  Social History Social History  Substance Use Topics  . Smoking status: Current Every Day Smoker    Packs/day: 1.00    Years: 17.00    Types: Cigarettes  . Smokeless tobacco: Never Used  . Alcohol use No     Comment: occasional    Review of Systems Constitutional: Negative for fever. Eyes: Negative for vision changes ENT:  Negative for congestion, sore throat Cardiovascular: Negative for chest pain. Respiratory: Negative for shortness of breath. Gastrointestinal: Negative for abdominal pain, vomiting and diarrhea. Genitourinary: Negative for dysuria. Musculoskeletal: Negative for back pain. Skin: Negative for rash. Neurological: Negative for headaches, focal weakness or numbness. Psychiatric: Positive for anxiety, negative for homicidal or suicidal ideation, negative for hallucinations  All systems negative/normal/unremarkable except as stated in the HPI  ____________________________________________   PHYSICAL EXAM:  VITAL SIGNS:  ED Triage Vitals  Enc Vitals Group     BP 03/30/17 1329 (!) 183/105     Pulse Rate 03/30/17 1329 80     Resp 03/30/17 1329 16     Temp 03/30/17 1329 98.9 F (37.2 C)     Temp Source 03/30/17 1329 Oral      SpO2 03/30/17 1329 99 %     Weight 03/30/17 1331 175 lb (79.4 kg)     Height 03/30/17 1331 5\' 3"  (1.6 m)     Head Circumference --      Peak Flow --      Pain Score 03/30/17 1329 0     Pain Loc --      Pain Edu? --      Excl. in GC? --     Constitutional: Alert and oriented. Tearful, no distress Eyes: Conjunctivae are normal. Normal extraocular movements. ENT   Head: Normocephalic and atraumatic.   Nose: No congestion/rhinnorhea.   Mouth/Throat: Mucous membranes are moist.   Neck: No stridor. Cardiovascular: Normal rate, regular rhythm. No murmurs, rubs, or gallops. Respiratory: Normal respiratory effort without tachypnea nor retractions. Breath sounds are clear and equal bilaterally. No wheezes/rales/rhonchi. Gastrointestinal: Soft and nontender. Normal bowel sounds Musculoskeletal: Nontender with normal range of motion in extremities. No lower extremity tenderness nor edema. Neurologic:  Normal speech and language. No gross focal neurologic deficits are appreciated.  Skin:  Skin is warm, dry and intact. No rash noted. Psychiatric: Depressed mood and affect ___________________________________________  ED COURSE:  Pertinent labs & imaging results that were available during my care of the patient were reviewed by me and considered in my medical decision making (see chart for details). Patient presents for anxiety and depression, we will assess with labs as indicated.   Procedures ____________________________________________   LABS (pertinent positives/negatives)  Labs Reviewed  COMPREHENSIVE METABOLIC PANEL - Abnormal; Notable for the following:       Result Value   Glucose, Bld 106 (*)    ALT 12 (*)    All other components within normal limits  CBC - Abnormal; Notable for the following:    RDW 15.1 (*)    All other components within normal limits  URINE DRUG SCREEN, QUALITATIVE ([facility] ONLY) - Abnormal; Notable for the following:    Benzodiazepine, Ur  Scrn POSITIVE (*)    All other components within normal limits  URINALYSIS, COMPLETE (UACMP) WITH MICROSCOPIC - Abnormal; Notable for the following:    Color, Urine YELLOW (*)    APPearance HAZY (*)    Hgb urine dipstick MODERATE (*)    Leukocytes, UA SMALL (*)    Bacteria, UA RARE (*)    Squamous Epithelial / LPF 6-30 (*)    All other components within normal limits  ETHANOL  POC URINE PREG, ED   ____________________________________________  FINAL ASSESSMENT AND PLAN  Anxiety, sleep disorder  Plan: Patient's labs were dictated above. Patient had presented for periods of waking up likely during REM sleep or she is experiencing skeletal muscle paralysis. This is likely exacerbated by anxiety. I will restart her antidepressant medications as well as sleep aids. She did not have any somatic complaints at this time. Overall she is stable for outpatient follow-up.   Emily Filbert, MD   Note: This note was generated in part or whole with voice recognition software. Voice recognition is usually quite accurate but there are transcription errors that can and very often do occur. I apologize for any typographical errors that were not detected  and corrected.     Emily FilbertWilliams, Jonathan E, MD 03/30/17 [phone]

## 2017-03-30 NOTE — ED Notes (Addendum)
Pt c/o anxiety, was seen yesterday. Pt c/o inattention, insomnia. Pt states she can't move when she is waking from sleep. Pt  A&Ox4, speech clear, pt tearful. VS stable.  Pt states off meds

## 2017-04-01 IMAGING — CT CT ABD-PELV W/ CM
1 of 2 series · 15 of 32 positions shown, 19 images · IV contrast (omnipaque)
Comparison: MRI abdomen 02/04/2014

CLINICAL DATA: Mid abdominal pain, nausea and vomiting since eating
spaghetti and [place] for dinner.

EXAM:
CT ABDOMEN AND PELVIS WITH CONTRAST
TECHNIQUE: Multidetector CT imaging of the abdomen and pelvis was performed
using the standard protocol following bolus administration of
intravenous contrast.
CONTRAST:  100mL OMNIPAQUE IOHEXOL 300 MG/ML  SOLN

[Series 2: routine abd pel with · axial · 0.68mm/px · z∈[-432,+2]mm · 15 of 95 slices shown, 19 images]
[im 4/95  soft-tissue]
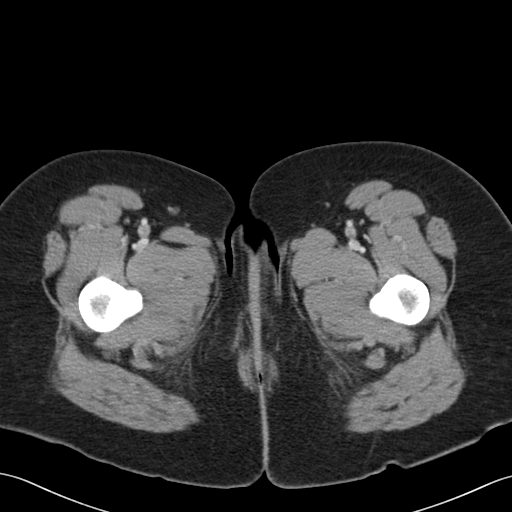
[im 4/95  bone]
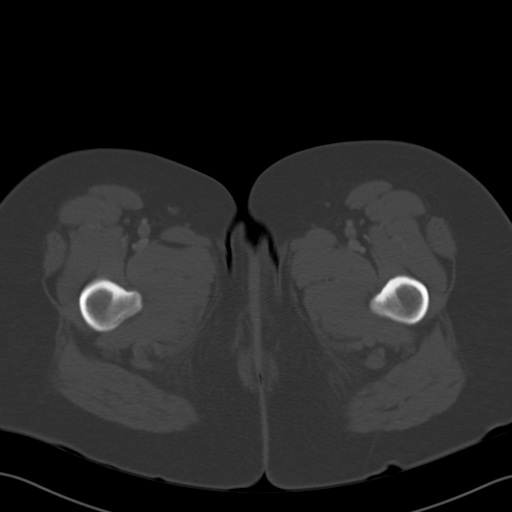
[im 12/95  soft-tissue]
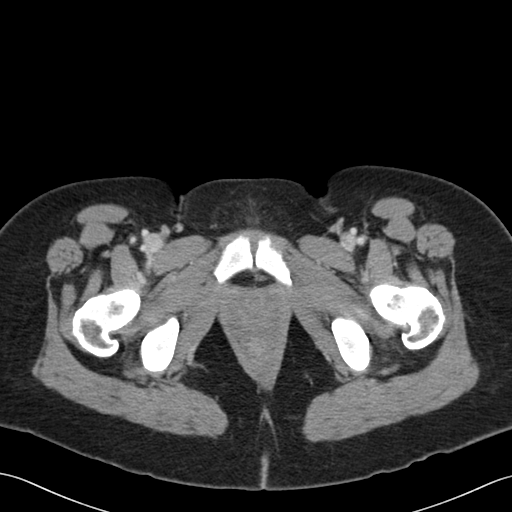
[im 20/95  soft-tissue]
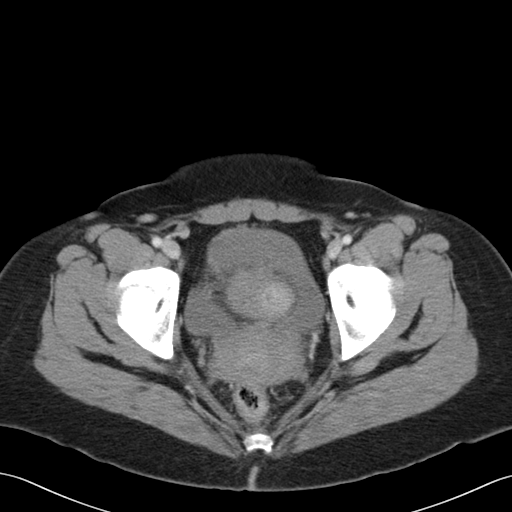
[im 28/95  soft-tissue]
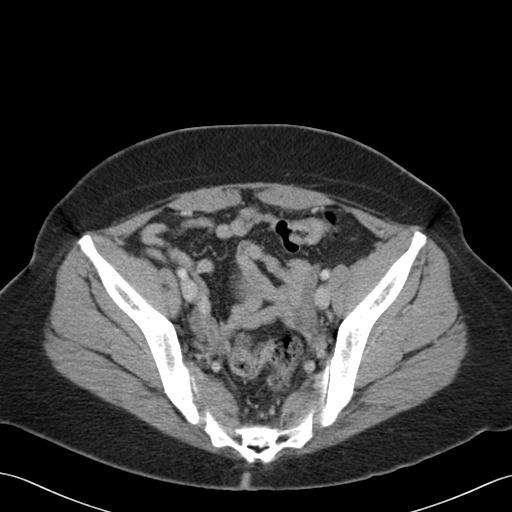
[im 32/95  soft-tissue]
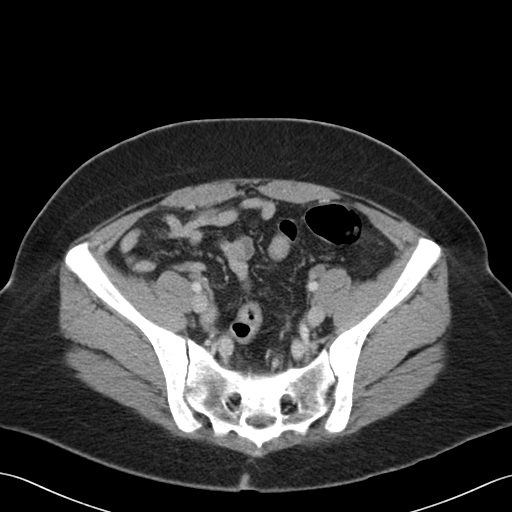
[im 40/95  soft-tissue]
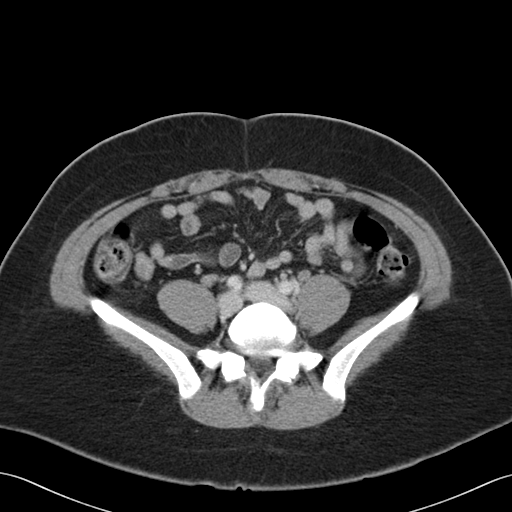
[im 48/95  soft-tissue]
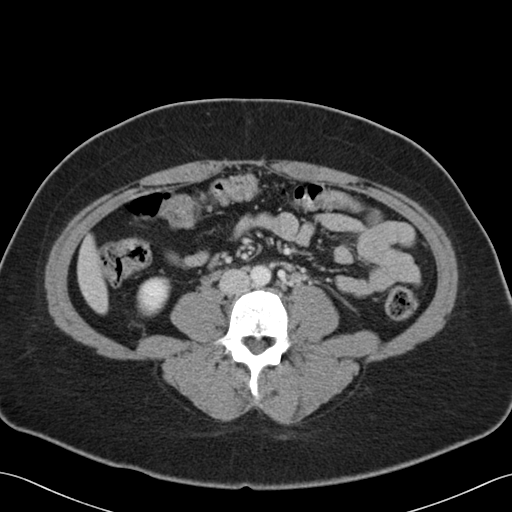
[im 55/95  soft-tissue]
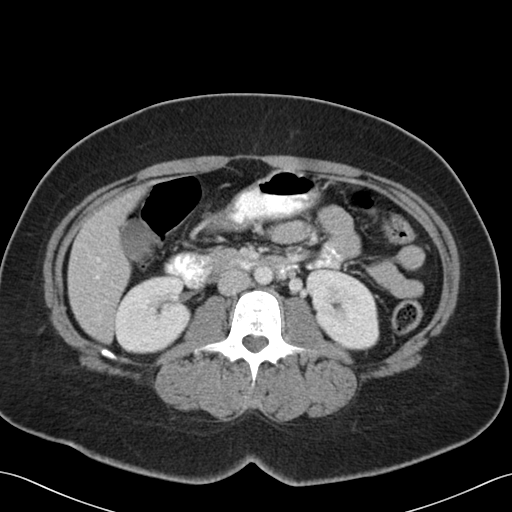
[im 63/95  soft-tissue]
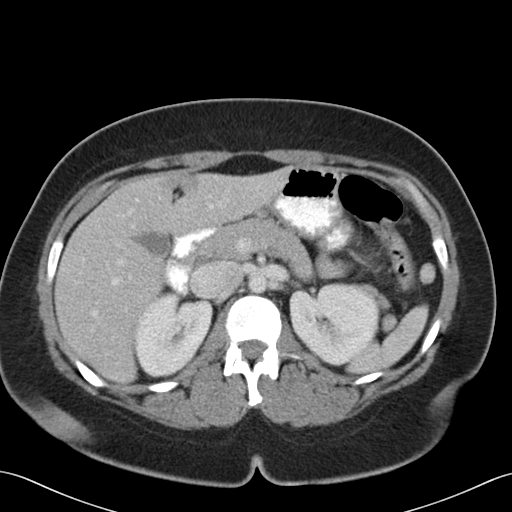
[im 63/95  bone]
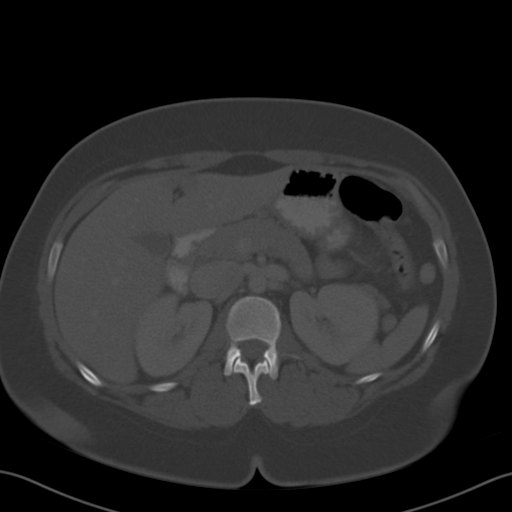
[im 67/95  soft-tissue]
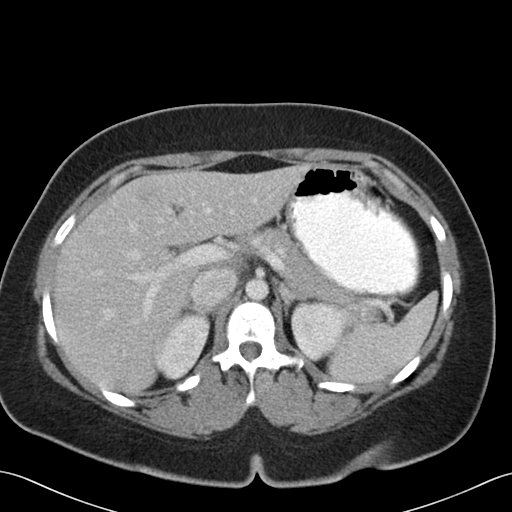
[im 75/95  soft-tissue]
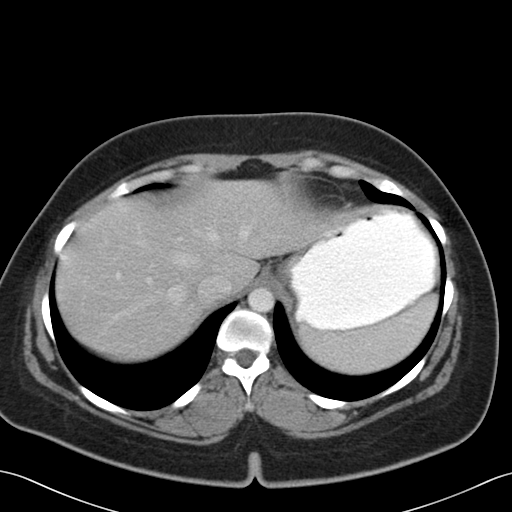
[im 79/95  lung]
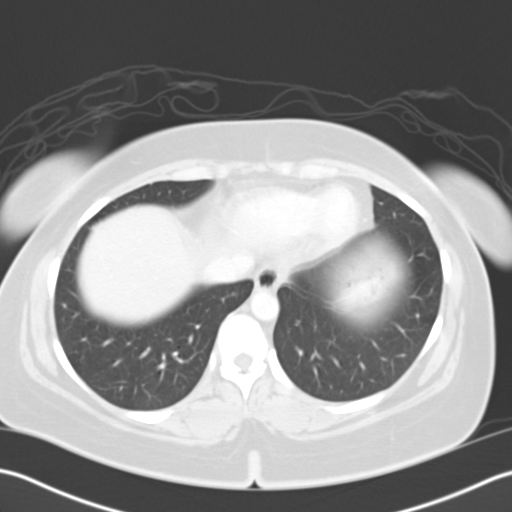
[im 83/95  soft-tissue]
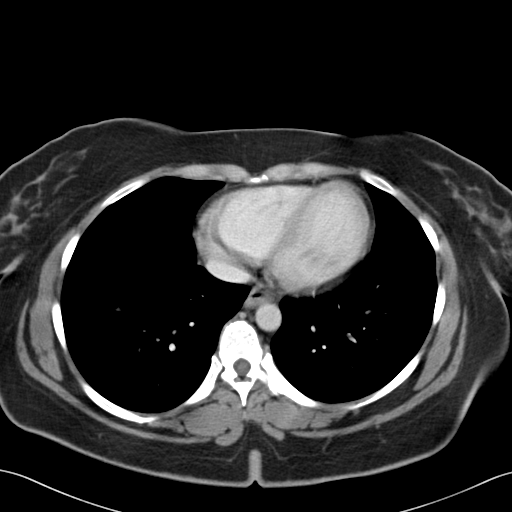
[im 83/95  lung]
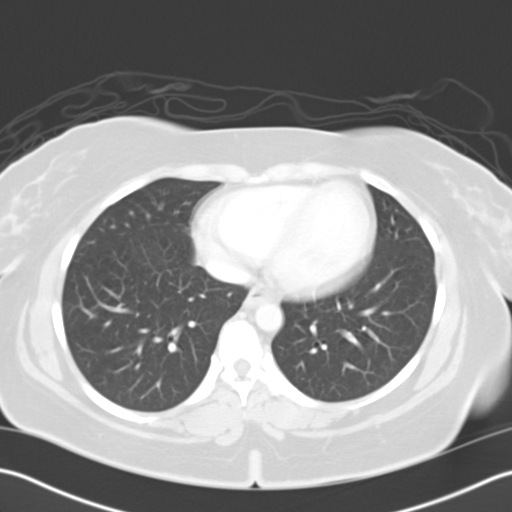
[im 87/95  lung]
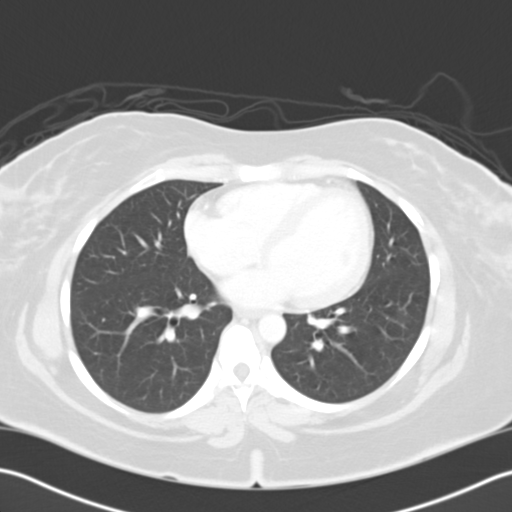
[im 91/95  soft-tissue]
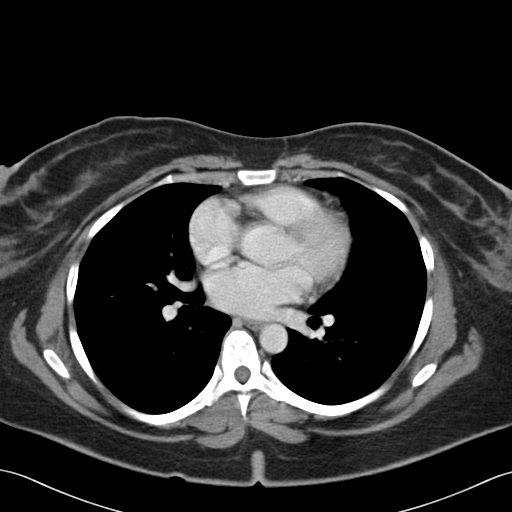
[im 91/95  lung]
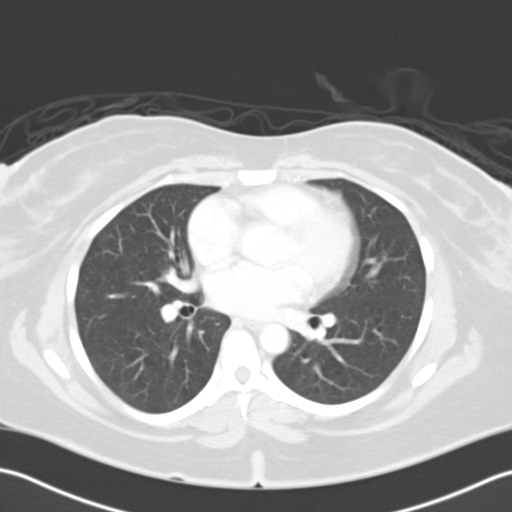

[15 of 32 positions shown; findings below may reference images not displayed]

FINDINGS: Lung bases are clear.

Focal fatty infiltration adjacent to the falciform ligament of the
liver. Gallbladder, spleen, pancreas, adrenal glands, kidneys,
abdominal aorta, inferior vena cava, and retroperitoneal lymph nodes
are unremarkable. Stomach, small bowel, and colon are not abnormally
distended. No discrete wall thickening is appreciated although
evaluation of the bowel wall is limited due to under distention. No
free air or free fluid in the abdomen. Minimal umbilical hernia
containing fat.

Pelvis: Uterus is anteverted. Intrauterine device is present.
Heterogeneous nodular enhancement of the uterus consistent with
fibroids. No abnormal adnexal masses. No free or loculated pelvic
fluid collections. No pelvic mass or lymphadenopathy. Bladder wall
is not thickened. Appendix is normal. No destructive bone lesions.
IMPRESSION: No focal acute process demonstrated in the abdomen or pelvis. No
evidence of bowel obstruction or inflammation. Minimal umbilical
hernia containing fat. Focal fatty infiltration in the liver.

## 2017-06-15 IMAGING — CR DG CHEST 1V PORT
1 series · 1 of 1 positions shown · non-contrast
Comparison: 09/07/2015 chest radiograph.

CLINICAL DATA: Chest pain

EXAM:
PORTABLE CHEST 1 VIEW

[portable]
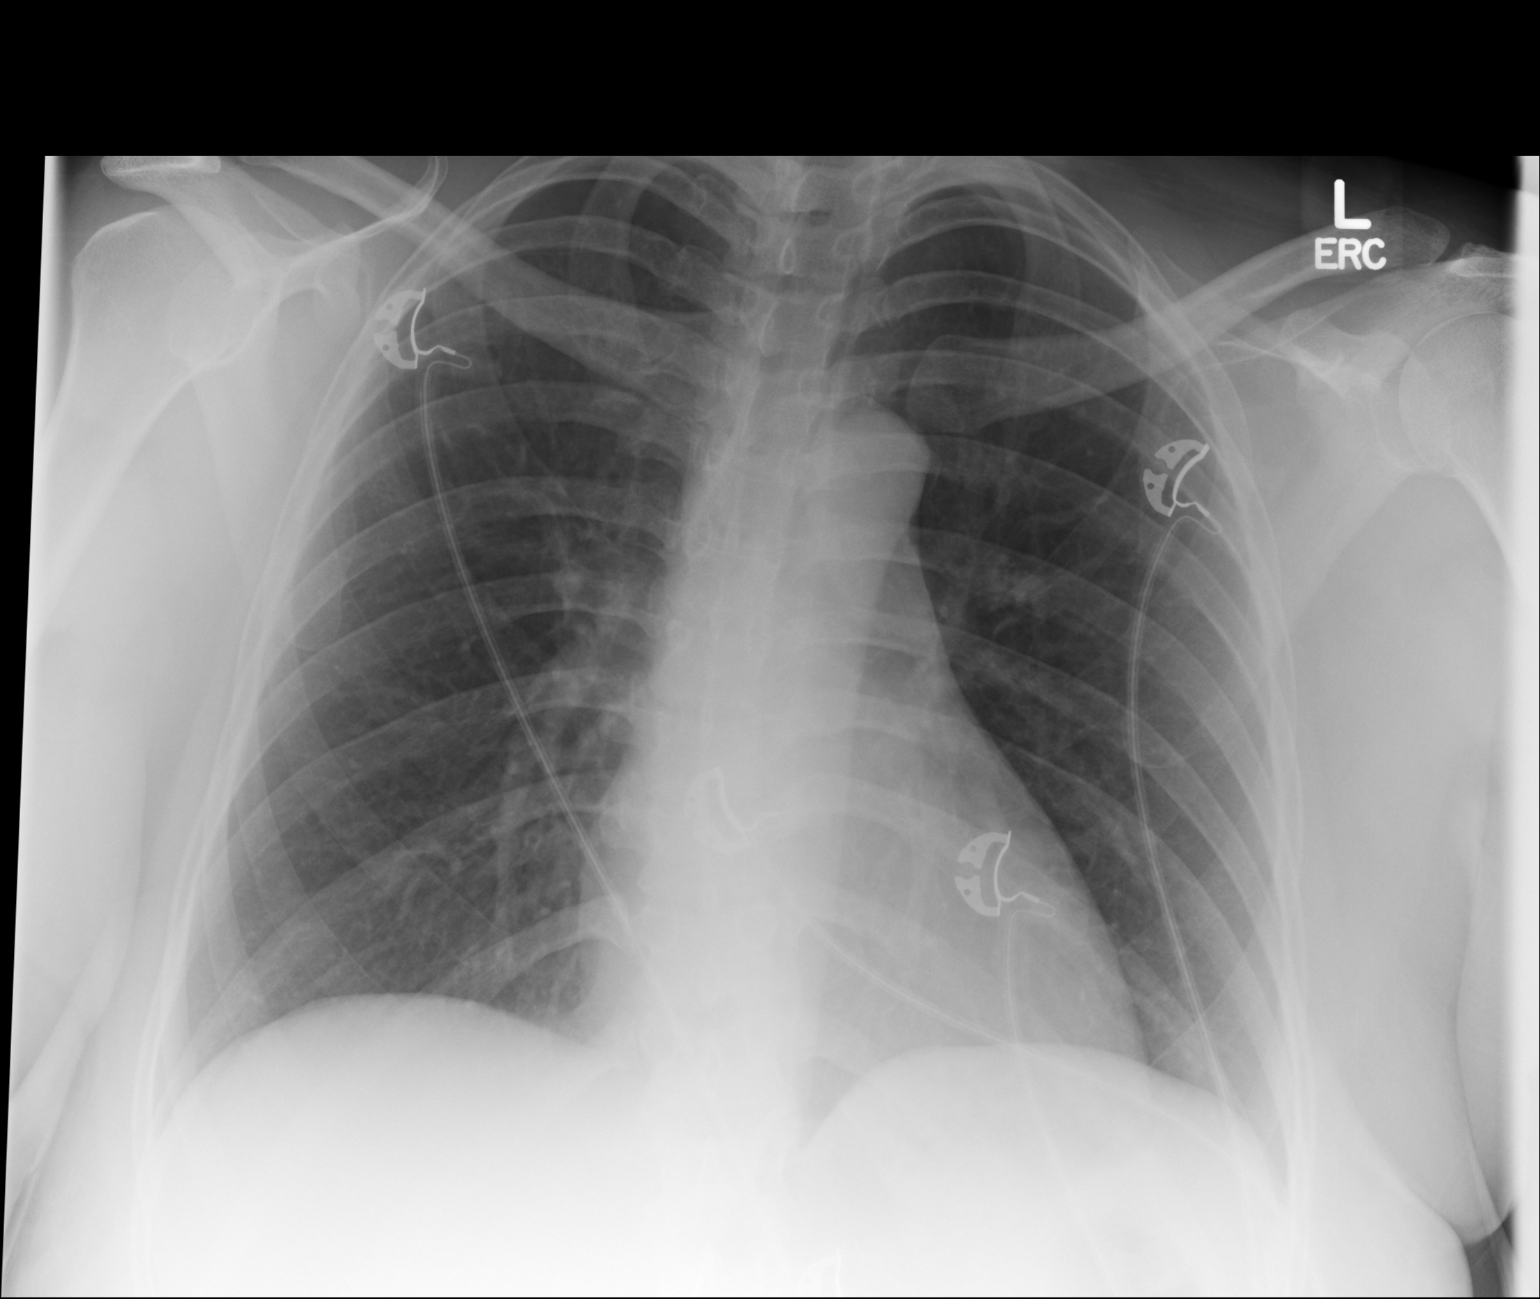

[1 of 1 positions shown; findings below may reference images not displayed]

FINDINGS: Stable cardiomediastinal silhouette with normal heart size. No
pneumothorax. No pleural effusion. Clear lungs, with no focal lung
consolidation and no pulmonary edema.
IMPRESSION: No active disease.

## 2017-08-21 ENCOUNTER — Encounter: Payer: Self-pay | Admitting: Emergency Medicine

## 2017-08-21 DIAGNOSIS — R4182 Altered mental status, unspecified: Secondary | ICD-10-CM | POA: Insufficient documentation

## 2017-08-21 DIAGNOSIS — Z5321 Procedure and treatment not carried out due to patient leaving prior to being seen by health care provider: Secondary | ICD-10-CM | POA: Insufficient documentation

## 2017-08-21 LAB — CBC
HEMATOCRIT: 35.1 % (ref 35.0–47.0)
Hemoglobin: 11.8 g/dL — ABNORMAL LOW (ref 12.0–16.0)
MCH: 29.6 pg (ref 26.0–34.0)
MCHC: 33.7 g/dL (ref 32.0–36.0)
MCV: 87.8 fL (ref 80.0–100.0)
PLATELETS: 212 10*3/uL (ref 150–440)
RBC: 4 MIL/uL (ref 3.80–5.20)
RDW: 14.6 % — AB (ref 11.5–14.5)
WBC: 5.4 10*3/uL (ref 3.6–11.0)

## 2017-08-21 LAB — BASIC METABOLIC PANEL
Anion gap: 9 (ref 5–15)
BUN: 14 mg/dL (ref 6–20)
CALCIUM: 9.1 mg/dL (ref 8.9–10.3)
CO2: 25 mmol/L (ref 22–32)
CREATININE: 0.71 mg/dL (ref 0.44–1.00)
Chloride: 104 mmol/L (ref 101–111)
GFR calc Af Amer: >60 mL/min (ref >60)
GLUCOSE: 115 mg/dL — AB (ref 65–99)
Potassium: 3.4 mmol/L — ABNORMAL LOW (ref 3.5–5.1)
Sodium: 138 mmol/L (ref 135–145)

## 2017-08-21 LAB — TROPONIN I

## 2017-08-21 NOTE — ED Triage Notes (Signed)
Patient to ER for c/o feeling drowsy tonight. States she has been on Suboxone and Seroquel for some time now, but just started taking Gabapentin. States after taking dose of Seroquel tonight (took Gabapentin earlier in the day), that she feels very drowsy and is afraid to go to sleep. Patient states chest feels somewhat tight, but has had chest cold recently.

## 2017-08-22 ENCOUNTER — Telehealth: Payer: Self-pay | Admitting: Emergency Medicine

## 2017-08-22 ENCOUNTER — Emergency Department
Admission: EM | Admit: 2017-08-22 | Discharge: 2017-08-22 | Discharge status: Against Medical Advice | Payer: Medicaid Other | Attending: Emergency Medicine | Admitting: Emergency Medicine

## 2017-08-22 NOTE — Telephone Encounter (Signed)
Called patient due to lwot to inquire about condition and follow up plans. Left message.   

## 2017-09-05 ENCOUNTER — Emergency Department
Admission: EM | Admit: 2017-09-05 | Discharge: 2017-09-05 | Disposition: A | Discharge status: Home / Self Care | Payer: Medicaid Other | Attending: Emergency Medicine | Admitting: Emergency Medicine

## 2017-09-05 ENCOUNTER — Encounter: Payer: Self-pay | Admitting: Emergency Medicine

## 2017-09-05 DIAGNOSIS — F17210 Nicotine dependence, cigarettes, uncomplicated: Secondary | ICD-10-CM | POA: Diagnosis not present

## 2017-09-05 DIAGNOSIS — R339 Retention of urine, unspecified: Secondary | ICD-10-CM | POA: Diagnosis present

## 2017-09-05 DIAGNOSIS — N3000 Acute cystitis without hematuria: Secondary | ICD-10-CM | POA: Diagnosis not present

## 2017-09-05 DIAGNOSIS — Z79899 Other long term (current) drug therapy: Secondary | ICD-10-CM | POA: Insufficient documentation

## 2017-09-05 LAB — URINALYSIS, COMPLETE (UACMP) WITH MICROSCOPIC
Bacteria, UA: NONE SEEN
Bilirubin Urine: NEGATIVE
GLUCOSE, UA: NEGATIVE mg/dL
Hgb urine dipstick: NEGATIVE
KETONES UR: 5 mg/dL — AB
Nitrite: NEGATIVE
PH: 6 (ref 5.0–8.0)
PROTEIN: 100 mg/dL — AB
Specific Gravity, Urine: 1.031 — ABNORMAL HIGH (ref 1.005–1.030)

## 2017-09-05 LAB — POCT PREGNANCY, URINE: Preg Test, Ur: NEGATIVE

## 2017-09-05 MED ORDER — SULFAMETHOXAZOLE-TRIMETHOPRIM 800-160 MG PO TABS: 1.0000 | ORAL_TABLET | Freq: Two times a day (BID) | ORAL | 0 refills | Status: DC

## 2017-09-05 MED ORDER — IBUPROFEN 600 MG PO TABS: 600.0000 mg | ORAL_TABLET | Freq: Four times a day (QID) | ORAL | 0 refills | Status: DC | PRN

## 2017-09-05 NOTE — ED Triage Notes (Addendum)
Patient presents to the ED with urinary retention x 1 week.  Patient denies dysuria.  Patient states, "I have to sit on the toilet and push and push for the pee to come out."  Patient is also complaining of lower back pain on her right side that radiates down her right gluteus.  Patient rubbing area during triage.  Patient denies any other complaints at this time.

## 2017-09-05 NOTE — Discharge Instructions (Signed)
Follow-up with her primary care provider for repeat urinalysis and 7-10 days. Return to the emergency department for symptoms that change or worsen if you're unable to schedule an appointment.

## 2017-09-05 NOTE — ED Notes (Signed)
Bladder scan complete- 125ml

## 2017-09-05 NOTE — ED Notes (Signed)
Pt reports that she is having difficulty urinating - she reports no pain with urination Pt also reports pain in right hip radiating into buttocks - pain present x1 week

## 2017-09-05 NOTE — ED Provider Notes (Signed)
[facility] Emergency Department Provider Note  ____________________________________________  Time seen: Approximately 4:09 PM  I have reviewed the triage vital signs and the nursing notes.   HISTORY  Chief Complaint Urinary Retention and Rectal Pain    HPI Laurie Frederick is a 41 y.o. female who presents to the emergency department for evaluation and treatment of urinary retention and right lower back pain that radiates into the buttocks. Urinary retention started approximately 1 week ago. She states that it takes about 5 minutes to start urinating and then is only able to get a small amount of urine out before the stream stops. She denies dysuria. She denies vaginal discharge or bleeding. She denies taking over-the-counter cold or allergy medications.Currently, she is taking citalopram, alprazolam, Wellbutrin, gabapentin, and Ambien. She stopped taking her Seroquel about 3 weeks ago because she felt it was causing her to be "too sedated." She has not taken any pain relievers for her back/buttock pain.  Past Medical History:  Diagnosis Date  . Anemia   . Anxiety   . Depression   . GERD (gastroesophageal reflux disease)   . H pylori ulcer   . Hyperlipidemia   . Multiple gastric ulcers   . Nontoxic uninodular goiter   . Obesity   . PTSD (post-traumatic stress disorder)   . Sleep apnea   . Vitamin D deficiency     Patient Active Problem List   Diagnosis Date Noted  . Overdose 04/11/2016  . Abdominal pain   . Anxiety   . Uncontrollable vomiting   . UTI (lower urinary tract infection) 03/13/2016  . Abdominal pain, epigastric   . Gastritis   . Hypokalemia 08/16/2015  . Chronic prescription benzodiazepine use 08/16/2015  . Intractable nausea and vomiting 07/22/2015  . Epigastric pain   . Helicobacter positive gastritis 07/16/2015  . Gastritis and gastroduodenitis 07/16/2015  . Hyperlipidemia 07/13/2015  . Vitamin D deficiency 07/13/2015  .  Thyroid nodule 07/13/2015  . Polyp of gallbladder 07/13/2015  . Liver nodule 07/13/2015  . Nausea & vomiting 07/13/2015  . Gastroenteritis 07/06/2015  . 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 Alcohol abuse 04/01/2015    Past Surgical History:  Procedure Laterality Date  . CESAREAN SECTION    . CESAREAN SECTION    . DILATION AND CURETTAGE OF UTERUS     x 2 TAB    Prior to Admission medications   Medication Sig Start Date End Date Taking? Authorizing Provider  ALPRAZolam (XANAX) 0.25 MG tablet Take 1 tablet (0.25 mg total) by mouth every 8 (eight) hours as needed for anxiety. 05/06/16   Lorre Nick, MD  buPROPion (WELLBUTRIN XL) 150 MG 24 hr tablet Take 150 mg by mouth daily.    [provider]  citalopram (CELEXA) 40 MG tablet Take 1 tablet (40 mg total) by mouth at bedtime. 03/30/17   Emily Filbert, MD  dicyclomine (BENTYL) 20 MG tablet Take 1 tablet (20 mg total) by mouth 3 (three) times daily as needed for spasms. 06/13/16   Sharman Cheek, MD  famotidine (PEPCID) 20 MG tablet Take 1 tablet (20 mg total) by mouth 2 (two) times daily. 06/13/16   Sharman Cheek, MD  ibuprofen (ADVIL,MOTRIN) 600 MG tablet Take 1 tablet (600 mg total) every 6 (six) hours as needed by mouth. 09/05/17   Arrayah Connors, Rulon Eisenmenger B, FNP  ketorolac (TORADOL) 10 MG tablet Take 1 tablet (10 mg total) by mouth every 6 (six) hours as needed. 11/08/16   Joni Reining, PA-C  lisinopril (PRINIVIL,ZESTRIL) 20 MG tablet Take 20 mg by mouth daily.    [provider]  methocarbamol (ROBAXIN-750) 750 MG tablet Take 1 tablet (750 mg total) by mouth 4 (four) times daily. 11/08/16   Joni ReiningSmith, Ronald K, PA-C  metoCLOPramide (REGLAN) 10 MG tablet Take 1 tablet (10 mg total) by mouth 4 (four) times daily -  before meals and at bedtime. 06/13/16   Sharman CheekStafford, Phillip, MD  omeprazole (PRILOSEC) 20 MG capsule Take 20 mg by mouth 2 (two) times daily before a meal.    [provider]  pantoprazole  (PROTONIX) 20 MG tablet Take 20 mg by mouth 2 (two) times daily.    [provider]  potassium chloride (K-DUR) 10 MEQ tablet Take 1 tablet (10 mEq total) by mouth daily. 04/16/16   Sharyn CreamerQuale, Mark, MD  potassium chloride SA (KLOR-CON M20) 20 MEQ tablet Take 1 tablet (20 mEq total) by mouth daily. 05/05/16   Loleta RoseForbach, Cory, MD  promethazine (PHENERGAN) 25 MG suppository Place 1 suppository (25 mg total) rectally every 6 (six) hours as needed for nausea. 05/05/16 05/05/17  Loleta RoseForbach, Cory, MD  QUEtiapine (SEROQUEL) 50 MG tablet Take 100 mg by mouth at bedtime.    [provider]  sucralfate (CARAFATE) 1 g tablet Take 1 tablet (1 g total) by mouth 4 (four) times daily -  with meals and at bedtime. 04/08/16   Jene EveryKinner, Robert, MD  sulfamethoxazole-trimethoprim (BACTRIM DS,SEPTRA DS) 800-160 MG tablet Take 1 tablet 2 (two) times daily by mouth. 09/05/17   Datha Kissinger, Rulon Eisenmengerari B, FNP  traMADol (ULTRAM) 50 MG tablet Take 1 tablet (50 mg total) by mouth every 6 (six) hours as needed. 05/06/16   Lorre NickAllen, Anthony, MD  zolpidem (AMBIEN) 10 MG tablet Take 1 tablet (10 mg total) by mouth at bedtime. 03/30/17   Emily FilbertWilliams, Jonathan E, MD    Allergies Patient has no known allergies.  Family History  Problem Relation Age of Onset  . Lung cancer Father   . Cancer Father        lung cancer  . Alcohol abuse Father   . Depression Mother   . Anxiety disorder Mother   . Arthritis Mother   . Hyperlipidemia Mother   . Asthma Son   . Lung cancer Paternal Aunt   . Cancer Paternal Aunt        lung  . Bone cancer Paternal Uncle   . Cancer Paternal Uncle        pancreatic  . Hypertension Maternal Grandmother   . Diabetes Paternal Grandmother   . Heart disease Paternal Grandmother   . Hypertension Paternal Grandmother   . Vision loss Neg Hx   . COPD Neg Hx     Social History Social History   Tobacco Use  . Smoking status: Current Every Day Smoker    Packs/day: 1.00    Years: 17.00    Pack years: 17.00    Types:  Cigarettes  . Smokeless tobacco: Never Used  Substance Use Topics  . Alcohol use: No    Comment: occasional  . Drug use: No    Review of Systems Constitutional: Negative for fever. Respiratory: Negative for shortness of breath or cough. Gastrointestinal: Negative for abdominal pain; negative for nausea , negative for vomiting. Genitourinary: Negative for dysuria , negative for vaginal discharge. Positive for urinary retention. Musculoskeletal: Positive for back pain. Skin: Negative for rash, lesion, or wheezing.. ____________________________________________   PHYSICAL EXAM:  VITAL SIGNS: ED Triage Vitals  Enc Vitals Group  BP 09/05/17 1549 (!) 150/97     Pulse Rate 09/05/17 1549 96     Resp 09/05/17 1549 18     Temp 09/05/17 1549 98.4 F (36.9 C)     Temp Source 09/05/17 1549 Oral     SpO2 09/05/17 1549 98 %     Weight 09/05/17 1551 188 lb (85.3 kg)     Height 09/05/17 1551 5\' 2"  (1.575 m)     Head Circumference --      Peak Flow --      Pain Score 09/05/17 1549 8     Pain Loc --      Pain Edu? --      Excl. in GC? --     Constitutional: Alert and oriented. Well appearing and in no acute distress. Eyes: Conjunctivae are normal.  Head: Atraumatic. Nose: No congestion/rhinnorhea. Mouth/Throat: Mucous membranes are moist. Respiratory: Normal respiratory effort.  No retractions. Gastrointestinal: Abdomen is soft, nontender, with no rebound or guarding. Bowel sounds present and active 4 quadrants. Genitourinary: Pelvic exam: Not indicated. Musculoskeletal: No extremity tenderness nor edema.  Neurologic:  Normal speech and language. No gross focal neurologic deficits are appreciated. Speech is normal. No gait instability. Skin:  Skin is warm, dry and intact. No rash noted. Psychiatric: Mood and affect are normal. Speech and behavior are normal.  ____________________________________________   LABS (all labs ordered are listed, but only abnormal results are  displayed)  Labs Reviewed  URINALYSIS, COMPLETE (UACMP) WITH MICROSCOPIC - Abnormal; Notable for the following components:      Result Value   Color, Urine YELLOW (*)    APPearance CLOUDY (*)    Specific Gravity, Urine 1.031 (*)    Ketones, ur 5 (*)    Protein, ur 100 (*)    Leukocytes, UA MODERATE (*)    Squamous Epithelial / LPF 6-30 (*)    All other components within normal limits  POC URINE PREG, ED  POCT PREGNANCY, URINE   ____________________________________________  RADIOLOGY  Not indicated ____________________________________________   PROCEDURES  Procedure(s) performed: None  ____________________________________________  41 year old female presenting to the emergency department for treatment evaluation of acute urinary retention. Bladder scan reveals 125 mL in her bladder prior to voiding. Urinalysis concerning for acute cystitis for which she will be started on Bactrim for the next 3 days. She was instructed to follow-up with her primary care provider in 7-10 days for repeat urinalysis and follow-up exam. She was encouraged to return to the emergency department for symptoms that change or worsen if she is unable see her primary care provider.  INITIAL IMPRESSION / ASSESSMENT AND PLAN / ED COURSE  Pertinent labs & imaging results that were available during my care of the patient were reviewed by me and considered in my medical decision making (see chart for details).  ____________________________________________   FINAL CLINICAL IMPRESSION(S) / ED DIAGNOSES  Final diagnoses:  Acute cystitis without hematuria    Note:  This document was prepared using Dragon voice recognition software and may include unintentional dictation errors.    Chinita Pesterriplett, Hemi Chacko B, FNP 09/05/17 1735    Dionne BucySiadecki, Sebastian, MD 09/05/17 [phone]

## 2017-11-08 ENCOUNTER — Telehealth: Payer: Self-pay

## 2017-11-08 NOTE — Telephone Encounter (Signed)
Called no answer

## 2017-11-08 NOTE — Telephone Encounter (Signed)
Pt called nurse line - please call back.  [phone]  VM not set up.  [phone] went to vm that just says 'you have reached [phone]....." no msg left.

## 2017-11-09 NOTE — Telephone Encounter (Signed)
Spoke with pt about iud being removed, pt has appt on 11/14/17

## 2017-11-14 ENCOUNTER — Ambulatory Visit: Payer: Self-pay | Admitting: Obstetrics & Gynecology

## 2017-12-18 ENCOUNTER — Encounter: Payer: Self-pay | Admitting: Emergency Medicine

## 2017-12-18 ENCOUNTER — Emergency Department
Admission: EM | Admit: 2017-12-18 | Discharge: 2017-12-18 | Disposition: A | Discharge status: Home / Self Care | Payer: Medicaid Other | Attending: Emergency Medicine | Admitting: Emergency Medicine

## 2017-12-18 ENCOUNTER — Emergency Department: Payer: Medicaid Other

## 2017-12-18 DIAGNOSIS — Z79899 Other long term (current) drug therapy: Secondary | ICD-10-CM | POA: Diagnosis not present

## 2017-12-18 DIAGNOSIS — F17210 Nicotine dependence, cigarettes, uncomplicated: Secondary | ICD-10-CM | POA: Diagnosis not present

## 2017-12-18 DIAGNOSIS — J0100 Acute maxillary sinusitis, unspecified: Secondary | ICD-10-CM

## 2017-12-18 DIAGNOSIS — N760 Acute vaginitis: Secondary | ICD-10-CM | POA: Insufficient documentation

## 2017-12-18 DIAGNOSIS — R0981 Nasal congestion: Secondary | ICD-10-CM | POA: Diagnosis present

## 2017-12-18 LAB — URINALYSIS, COMPLETE (UACMP) WITH MICROSCOPIC
Bacteria, UA: NONE SEEN
Bilirubin Urine: NEGATIVE
GLUCOSE, UA: NEGATIVE mg/dL
Hgb urine dipstick: NEGATIVE
KETONES UR: 20 mg/dL — AB
Nitrite: NEGATIVE
Protein, ur: NEGATIVE mg/dL
Specific Gravity, Urine: 1.03 (ref 1.005–1.030)
pH: 5 (ref 5.0–8.0)

## 2017-12-18 LAB — CHLAMYDIA/NGC RT PCR (ARMC ONLY)
CHLAMYDIA TR: NOT DETECTED
N gonorrhoeae: NOT DETECTED

## 2017-12-18 LAB — WET PREP, GENITAL
CLUE CELLS WET PREP: NONE SEEN
Sperm: NONE SEEN
Trich, Wet Prep: NONE SEEN
Yeast Wet Prep HPF POC: NONE SEEN

## 2017-12-18 MED ORDER — METRONIDAZOLE 500 MG PO TABS: 500.0000 mg | ORAL_TABLET | Freq: Two times a day (BID) | ORAL | 0 refills | Status: DC

## 2017-12-18 MED ORDER — CEFTRIAXONE SODIUM 1 G IJ SOLR
500.0000 mg | Freq: Once | INTRAMUSCULAR | Status: AC
  Administered 2017-12-18: 1000 mg via INTRAMUSCULAR
  Filled 2017-12-18: qty 10

## 2017-12-18 MED ORDER — AZITHROMYCIN 500 MG PO TABS
1000.0000 mg | ORAL_TABLET | Freq: Every day | ORAL | Status: DC
  Administered 2017-12-18: 1000 mg via ORAL
  Filled 2017-12-18: qty 2

## 2017-12-18 NOTE — ED Notes (Signed)
Pt verbalizes understanding of d/c instructions, medications and follow up 

## 2017-12-18 NOTE — ED Notes (Signed)
Pt reports started with pressure in her head and now thinks she has a sinus infection. Pt reports has taken medication and gotten better but she feels bad again. Pt states that she thinks she has pneumonia. Pt denies cough, reports vomitted once last night. Pt reports thinks she has pneumonia because when she blows her nose it is green. Pt reports thinks that all the infection has piled up in her body because she also has some discharge coming from her vagina now. Pt reports discharge is yellow in color, denies itching. Pt reports it is not discharge it is drainage.

## 2017-12-18 NOTE — ED Notes (Signed)
First Nurse Note:  Patient states she thinks she has "pneumonia".  History of cough and congestion.  Alert and oriented.

## 2017-12-18 NOTE — ED Provider Notes (Signed)
[facility] Emergency Department Provider Note  ____________________________________________  Time seen: Approximately 10:07 AM  I have reviewed the triage vital signs and the nursing notes.   HISTORY  Chief Complaint Nasal Congestion    HPI Laurie Frederick is a 42 y.o. female who presents emergency department for evaluation and treatment of sinus pain and pressure and vaginal discharge.  Patient is under the impression that she allowed her sinus infection to progress to the point where it is the cause of her vaginal discharge.  She denies STD exposure.  She denies abdominal pain.  She denies dysuria or fever.  She is also complaining of a cough and believes that she has pneumonia.  No alleviating measures have been attempted for this complaint prior to arrival.  Past Medical History:  Diagnosis Date  . Anemia   . Anxiety   . Depression   . GERD (gastroesophageal reflux disease)   . H pylori ulcer   . Hyperlipidemia   . Multiple gastric ulcers   . Nontoxic uninodular goiter   . Obesity   . PTSD (post-traumatic stress disorder)   . Sleep apnea   . Vitamin D deficiency     Patient Active Problem List   Diagnosis Date Noted  . Overdose 04/11/2016  . Abdominal pain   . Anxiety   . Uncontrollable vomiting   . UTI (lower urinary tract infection) 03/13/2016  . Abdominal pain, epigastric   . Gastritis   . Hypokalemia 08/16/2015  . Chronic prescription benzodiazepine use 08/16/2015  . Intractable nausea and vomiting 07/22/2015  . Epigastric pain   . Helicobacter positive gastritis 07/16/2015  . Gastritis and gastroduodenitis 07/16/2015  . Hyperlipidemia 07/13/2015  . Vitamin D deficiency 07/13/2015  . Thyroid nodule 07/13/2015  . Polyp of gallbladder 07/13/2015  . Liver nodule 07/13/2015  . Nausea & vomiting 07/13/2015  . Gastroenteritis 07/06/2015  . 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 Alcohol abuse 04/01/2015    Past Surgical  History:  Procedure Laterality Date  . CESAREAN SECTION    . CESAREAN SECTION    . DILATION AND CURETTAGE OF UTERUS     x 2 TAB  . ESOPHAGOGASTRODUODENOSCOPY (EGD) WITH PROPOFOL Left 07/07/2015   Procedure: ESOPHAGOGASTRODUODENOSCOPY (EGD) WITH PROPOFOL;  Surgeon: Scot Jun, MD;  Location: [facility] ENDOSCOPY;  Service: Endoscopy;  Laterality: Left;  . ESOPHAGOGASTRODUODENOSCOPY (EGD) WITH PROPOFOL N/A 09/05/2015   Procedure: ESOPHAGOGASTRODUODENOSCOPY (EGD) WITH PROPOFOL;  Surgeon: Midge Minium, MD;  Location: [facility] ENDOSCOPY;  Service: Endoscopy;  Laterality: N/A;  . ESOPHAGOGASTRODUODENOSCOPY (EGD) WITH PROPOFOL N/A 03/15/2016   Procedure: ESOPHAGOGASTRODUODENOSCOPY (EGD) WITH PROPOFOL;  Surgeon: Midge Minium, MD;  Location: [facility] ENDOSCOPY;  Service: Endoscopy;  Laterality: N/A;    Prior to Admission medications   Medication Sig Start Date End Date Taking? Authorizing Provider  ALPRAZolam (XANAX) 0.25 MG tablet Take 1 tablet (0.25 mg total) by mouth every 8 (eight) hours as needed for anxiety. 05/06/16   Lorre Nick, MD  buPROPion (WELLBUTRIN XL) 150 MG 24 hr tablet Take 150 mg by mouth daily.    [provider]  citalopram (CELEXA) 40 MG tablet Take 1 tablet (40 mg total) by mouth at bedtime. 03/30/17   Emily Filbert, MD  dicyclomine (BENTYL) 20 MG tablet Take 1 tablet (20 mg total) by mouth 3 (three) times daily as needed for spasms. 06/13/16   Sharman Cheek, MD  famotidine (PEPCID) 20 MG tablet Take 1 tablet (20 mg total) by mouth 2 (two) times daily. 06/13/16  Sharman CheekStafford, Phillip, MD  ibuprofen (ADVIL,MOTRIN) 600 MG tablet Take 1 tablet (600 mg total) every 6 (six) hours as needed by mouth. 09/05/17   Nettie Cromwell, Rulon Eisenmengerari B, FNP  ketorolac (TORADOL) 10 MG tablet Take 1 tablet (10 mg total) by mouth every 6 (six) hours as needed. 11/08/16   Joni ReiningSmith, Ronald K, PA-C  lisinopril (PRINIVIL,ZESTRIL) 20 MG tablet Take 20 mg by mouth daily.    [provider]  methocarbamol  (ROBAXIN-750) 750 MG tablet Take 1 tablet (750 mg total) by mouth 4 (four) times daily. 11/08/16   Joni ReiningSmith, Ronald K, PA-C  metoCLOPramide (REGLAN) 10 MG tablet Take 1 tablet (10 mg total) by mouth 4 (four) times daily -  before meals and at bedtime. 06/13/16   Sharman CheekStafford, Phillip, MD  metroNIDAZOLE (FLAGYL) 500 MG tablet Take 1 tablet (500 mg total) by mouth 2 (two) times daily. 12/18/17   Semiah Konczal, Rulon Eisenmengerari B, FNP  omeprazole (PRILOSEC) 20 MG capsule Take 20 mg by mouth 2 (two) times daily before a meal.    [provider]  pantoprazole (PROTONIX) 20 MG tablet Take 20 mg by mouth 2 (two) times daily.    [provider]  potassium chloride (K-DUR) 10 MEQ tablet Take 1 tablet (10 mEq total) by mouth daily. 04/16/16   Sharyn CreamerQuale, Mark, MD  potassium chloride SA (KLOR-CON M20) 20 MEQ tablet Take 1 tablet (20 mEq total) by mouth daily. 05/05/16   Loleta RoseForbach, Cory, MD  promethazine (PHENERGAN) 25 MG suppository Place 1 suppository (25 mg total) rectally every 6 (six) hours as needed for nausea. 05/05/16 05/05/17  Loleta RoseForbach, Cory, MD  QUEtiapine (SEROQUEL) 50 MG tablet Take 100 mg by mouth at bedtime.    [provider]  sucralfate (CARAFATE) 1 g tablet Take 1 tablet (1 g total) by mouth 4 (four) times daily -  with meals and at bedtime. 04/08/16   Jene EveryKinner, Robert, MD  sulfamethoxazole-trimethoprim (BACTRIM DS,SEPTRA DS) 800-160 MG tablet Take 1 tablet 2 (two) times daily by mouth. 09/05/17   Vernell Townley, Rulon Eisenmengerari B, FNP  traMADol (ULTRAM) 50 MG tablet Take 1 tablet (50 mg total) by mouth every 6 (six) hours as needed. 05/06/16   Lorre NickAllen, Anthony, MD  zolpidem (AMBIEN) 10 MG tablet Take 1 tablet (10 mg total) by mouth at bedtime. 03/30/17   Emily FilbertWilliams, Jonathan E, MD    Allergies Patient has no known allergies.  Family History  Problem Relation Age of Onset  . Lung cancer Father   . Cancer Father        lung cancer  . Alcohol abuse Father   . Depression Mother   . Anxiety disorder Mother   . Arthritis Mother   .  Hyperlipidemia Mother   . Asthma Son   . Lung cancer Paternal Aunt   . Cancer Paternal Aunt        lung  . Bone cancer Paternal Uncle   . Cancer Paternal Uncle        pancreatic  . Hypertension Maternal Grandmother   . Diabetes Paternal Grandmother   . Heart disease Paternal Grandmother   . Hypertension Paternal Grandmother   . Vision loss Neg Hx   . COPD Neg Hx     Social History Social History   Tobacco Use  . Smoking status: Current Every Day Smoker    Packs/day: 1.00    Years: 17.00    Pack years: 17.00    Types: Cigarettes  . Smokeless tobacco: Never Used  Substance Use Topics  . Alcohol use:  No    Comment: occasional  . Drug use: No    Review of Systems Constitutional: Negative for fever. Respiratory: Positive for shortness of breath or cough. Gastrointestinal: Negative for abdominal pain; negative for nausea , negative for vomiting. Genitourinary: Negative for dysuria , positive for vaginal discharge. Musculoskeletal: Negative for back pain. Skin: Negative for rash. ____________________________________________   PHYSICAL EXAM:  VITAL SIGNS: ED Triage Vitals  Enc Vitals Group     BP 12/18/17 0911 (!) 163/92     Pulse Rate 12/18/17 0911 100     Resp 12/18/17 0911 20     Temp 12/18/17 0911 98.4 F (36.9 C)     Temp Source 12/18/17 0911 Oral     SpO2 12/18/17 0911 98 %     Weight 12/18/17 0912 180 lb (81.6 kg)     Height 12/18/17 0912 5\' 2"  (1.575 m)     Head Circumference --      Peak Flow --      Pain Score 12/18/17 0912 5     Pain Loc --      Pain Edu? --      Excl. in GC? --     Constitutional: Alert and oriented. Well appearing and in no acute distress. Eyes: Conjunctivae are normal. PERRL. EOMI. Head: Atraumatic. Nose: No congestion/rhinnorhea. Mouth/Throat: Mucous membranes are moist. Respiratory: Normal respiratory effort.  No retractions. Gastrointestinal: Abdomen is non-tender. Genitourinary: Pelvic exam: Copious amount of  yellow/green fluid in the vaginal vault and is draining from the cervical os. No cervical motion tenderness. No adnexal tenderness. Musculoskeletal: No extremity tenderness nor edema.  Neurologic:  Normal speech and language. No gross focal neurologic deficits are appreciated. Speech is normal. No gait instability. Skin:  Skin is warm, dry and intact. No rash noted. Psychiatric: Mood and affect are normal. Speech and behavior are normal.  ____________________________________________   LABS (all labs ordered are listed, but only abnormal results are displayed)  Labs Reviewed  WET PREP, GENITAL - Abnormal; Notable for the following components:      Result Value   WBC, Wet Prep HPF POC MANY (*)    All other components within normal limits  URINALYSIS, COMPLETE (UACMP) WITH MICROSCOPIC - Abnormal; Notable for the following components:   Color, Urine YELLOW (*)    APPearance HAZY (*)    Ketones, ur 20 (*)    Leukocytes, UA TRACE (*)    Squamous Epithelial / LPF 0-5 (*)    All other components within normal limits  CHLAMYDIA/NGC RT PCR ([facility] ONLY)   ____________________________________________  RADIOLOGY  Chest x-rays negative for acute cardiopulmonary abnormality per radiology.  I, Kem Boroughs, personally viewed and evaluated these images (plain radiographs) as part of my medical decision making, as well as reviewing the written report by the radiologist. ___________________________________________   PROCEDURES  Procedure(s) performed: None  ____________________________________________  42 year old female presenting to the emergency department for treatment of sinus congestion, cough, and vaginal discharge. Although the vaginal swabs are negative, her exam is certainly concerning for STD and she will be treated with Rocephin and Azithromycin and given a prescription for Flagyl. She was advised to follow up with the primary care provider of her choice for symptoms that  are not improving with medication.  INITIAL IMPRESSION / ASSESSMENT AND PLAN / ED COURSE  Pertinent labs & imaging results that were available during my care of the patient were reviewed by me and considered in my medical decision making (see chart for details).  ____________________________________________   FINAL CLINICAL IMPRESSION(S) / ED DIAGNOSES  Final diagnoses:  Acute vaginitis  Acute non-recurrent maxillary sinusitis    Note:  This document was prepared using Dragon voice recognition software and may include unintentional dictation errors.    Chinita Pester, FNP 12/18/17 1536    Emily Filbert, MD 12/21/17 [phone]

## 2017-12-18 NOTE — ED Triage Notes (Signed)
Has had green nasal drainage and right frontal facial pain to sinus area.  Pt thinks has sinus infection.  Vomit X 1 yesterday per pt from drainage.  No known fevers.  "the green nasal drainage in draining out my pussy because I let it go too long".  Denies pain to vaginal area, just drainage.

## 2018-01-19 ENCOUNTER — Emergency Department
Admission: EM | Admit: 2018-01-19 | Discharge: 2018-01-19 | Disposition: A | Discharge status: Home / Self Care | Payer: Medicaid Other | Attending: Emergency Medicine | Admitting: Emergency Medicine

## 2018-01-19 ENCOUNTER — Other Ambulatory Visit: Payer: Self-pay

## 2018-01-19 DIAGNOSIS — Z79899 Other long term (current) drug therapy: Secondary | ICD-10-CM | POA: Insufficient documentation

## 2018-01-19 DIAGNOSIS — F17210 Nicotine dependence, cigarettes, uncomplicated: Secondary | ICD-10-CM | POA: Insufficient documentation

## 2018-01-19 DIAGNOSIS — R51 Headache: Secondary | ICD-10-CM | POA: Diagnosis not present

## 2018-01-19 DIAGNOSIS — N760 Acute vaginitis: Secondary | ICD-10-CM | POA: Diagnosis not present

## 2018-01-19 DIAGNOSIS — N898 Other specified noninflammatory disorders of vagina: Secondary | ICD-10-CM | POA: Diagnosis present

## 2018-01-19 LAB — URINALYSIS, COMPLETE (UACMP) WITH MICROSCOPIC
Bacteria, UA: NONE SEEN
Bilirubin Urine: NEGATIVE
GLUCOSE, UA: NEGATIVE mg/dL
HGB URINE DIPSTICK: NEGATIVE
Ketones, ur: NEGATIVE mg/dL
Leukocytes, UA: NEGATIVE
NITRITE: NEGATIVE
Protein, ur: 30 mg/dL — AB
SPECIFIC GRAVITY, URINE: 1.033 — AB (ref 1.005–1.030)
pH: 5 (ref 5.0–8.0)

## 2018-01-19 LAB — CHLAMYDIA/NGC RT PCR (ARMC ONLY)
CHLAMYDIA TR: NOT DETECTED
N gonorrhoeae: NOT DETECTED

## 2018-01-19 LAB — WET PREP, GENITAL
Clue Cells Wet Prep HPF POC: NONE SEEN
SPERM: NONE SEEN
Trich, Wet Prep: NONE SEEN
Yeast Wet Prep HPF POC: NONE SEEN

## 2018-01-19 MED ORDER — ACETAMINOPHEN 325 MG PO TABS
650.0000 mg | ORAL_TABLET | Freq: Once | ORAL | Status: AC
  Administered 2018-01-19: 650 mg via ORAL
  Filled 2018-01-19: qty 2

## 2018-01-19 MED ORDER — METRONIDAZOLE 500 MG PO TABS: 500.0000 mg | ORAL_TABLET | Freq: Two times a day (BID) | ORAL | 0 refills | Status: DC

## 2018-01-19 NOTE — ED Notes (Signed)
Pt states that her vagina has knots on the left inner side - she also reports vaginal discharge ("like water leaking out of me") - denies any odor to discharge - she states the discharge is causing her to have migraines - she was her one week ago and given treatment for vaginosis - she had a tooth pulled last week and was given atb

## 2018-01-19 NOTE — ED Triage Notes (Addendum)
Pt arrived via POV from home, pt states she is having vaginal discharge and states she has knots on the left side of her vagina. Pt also c/o headache.  Pt denies any recent unprotected sex.

## 2018-01-19 NOTE — ED Notes (Signed)
Pt was leaving room and making imitation of nurse speaking to her and repeating what had been said - pt stated "father God what a world we live in" - pt got to the end of the hall and turned around and called this nurse "bitch" as she was walking out

## 2018-01-19 NOTE — Discharge Instructions (Signed)
Get your prescription filled. Call and make an appointment with [facility] to have your IUD changed and for recheck of the vaginal discharge.

## 2018-01-19 NOTE — ED Provider Notes (Signed)
[facility] Emergency Department Provider Note  ____________________________________________  Time seen: Approximately 5:08 PM  I have reviewed the triage vital signs and the nursing notes.   HISTORY  Chief Complaint Vaginal Discharge and Headache    HPI Laurie Frederick is a 42 y.o. female who presents to the emergency department for treatment and evaluation of headache and vaginal discharge.  She was here approximately 2 weeks ago for similar complaints.  She states that she did not receive a prescription upon discharge last time and believes that her infection has now gotten so bad that it is "draining" her and causing a "migraine." She states that upon awakening, her sheets are wet from her discharge. She denies new sexual partners since her last visit.   Past Medical History:  Diagnosis Date  . Anemia   . Anxiety   . Depression   . GERD (gastroesophageal reflux disease)   . H pylori ulcer   . Hyperlipidemia   . Multiple gastric ulcers   . Nontoxic uninodular goiter   . Obesity   . PTSD (post-traumatic stress disorder)   . Sleep apnea   . Vitamin D deficiency     Patient Active Problem List   Diagnosis Date Noted  . Overdose 04/11/2016  . Abdominal pain   . Anxiety   . Uncontrollable vomiting   . UTI (lower urinary tract infection) 03/13/2016  . Abdominal pain, epigastric   . Gastritis   . Hypokalemia 08/16/2015  . Chronic prescription benzodiazepine use 08/16/2015  . Intractable nausea and vomiting 07/22/2015  . Epigastric pain   . Helicobacter positive gastritis 07/16/2015  . Gastritis and gastroduodenitis 07/16/2015  . Hyperlipidemia 07/13/2015  . Vitamin D deficiency 07/13/2015  . Thyroid nodule 07/13/2015  . Polyp of gallbladder 07/13/2015  . Liver nodule 07/13/2015  . Nausea & vomiting 07/13/2015  . Gastroenteritis 07/06/2015  . 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 Alcohol abuse 04/01/2015    Past Surgical  History:  Procedure Laterality Date  . CESAREAN SECTION    . CESAREAN SECTION    . DILATION AND CURETTAGE OF UTERUS     x 2 TAB  . ESOPHAGOGASTRODUODENOSCOPY (EGD) WITH PROPOFOL Left 07/07/2015   Procedure: ESOPHAGOGASTRODUODENOSCOPY (EGD) WITH PROPOFOL;  Surgeon: Scot Junobert T Elliott, MD;  Location[facility] ENDOSCOPY;  Service: Endoscopy;  Laterality: Left;  . ESOPHAGOGASTRODUODENOSCOPY (EGD) WITH PROPOFOL N/A 09/05/2015   Procedure: ESOPHAGOGASTRODUODENOSCOPY (EGD) WITH PROPOFOL;  Surgeon: Midge Miniumarren Wohl, MD;  Location: [facility] ENDOSCOPY;  Service: Endoscopy;  Laterality: N/A;  . ESOPHAGOGASTRODUODENOSCOPY (EGD) WITH PROPOFOL N/A 03/15/2016   Procedure: ESOPHAGOGASTRODUODENOSCOPY (EGD) WITH PROPOFOL;  Surgeon: Midge Miniumarren Wohl, MD;  Location: [facility] ENDOSCOPY;  Service: Endoscopy;  Laterality: N/A;    Prior to Admission medications   Medication Sig Start Date End Date Taking? Authorizing Provider  ALPRAZolam (XANAX) 0.25 MG tablet Take 1 tablet (0.25 mg total) by mouth every 8 (eight) hours as needed for anxiety. 05/06/16   Lorre NickAllen, Anthony, MD  buPROPion (WELLBUTRIN XL) 150 MG 24 hr tablet Take 150 mg by mouth daily.    [provider]  citalopram (CELEXA) 40 MG tablet Take 1 tablet (40 mg total) by mouth at bedtime. 03/30/17   Emily FilbertWilliams, Jonathan E, MD  dicyclomine (BENTYL) 20 MG tablet Take 1 tablet (20 mg total) by mouth 3 (three) times daily as needed for spasms. 06/13/16   Sharman CheekStafford, Phillip, MD  famotidine (PEPCID) 20 MG tablet Take 1 tablet (20 mg total) by mouth 2 (two) times daily. 06/13/16   Scotty CourtStafford,  Aneta Mins, MD  ibuprofen (ADVIL,MOTRIN) 600 MG tablet Take 1 tablet (600 mg total) every 6 (six) hours as needed by mouth. 09/05/17   Owais Pruett, Rulon Eisenmenger B, FNP  ketorolac (TORADOL) 10 MG tablet Take 1 tablet (10 mg total) by mouth every 6 (six) hours as needed. 11/08/16   Joni Reining, PA-C  lisinopril (PRINIVIL,ZESTRIL) 20 MG tablet Take 20 mg by mouth daily.    [provider]  methocarbamol  (ROBAXIN-750) 750 MG tablet Take 1 tablet (750 mg total) by mouth 4 (four) times daily. 11/08/16   Joni Reining, PA-C  metoCLOPramide (REGLAN) 10 MG tablet Take 1 tablet (10 mg total) by mouth 4 (four) times daily -  before meals and at bedtime. 06/13/16   Sharman Cheek, MD  metroNIDAZOLE (FLAGYL) 500 MG tablet Take 1 tablet (500 mg total) by mouth 2 (two) times daily. 01/19/18   Karoline Fleer, Rulon Eisenmenger B, FNP  omeprazole (PRILOSEC) 20 MG capsule Take 20 mg by mouth 2 (two) times daily before a meal.    [provider]  pantoprazole (PROTONIX) 20 MG tablet Take 20 mg by mouth 2 (two) times daily.    [provider]  potassium chloride (K-DUR) 10 MEQ tablet Take 1 tablet (10 mEq total) by mouth daily. 04/16/16   Sharyn Creamer, MD  potassium chloride SA (KLOR-CON M20) 20 MEQ tablet Take 1 tablet (20 mEq total) by mouth daily. 05/05/16   Loleta Rose, MD  promethazine (PHENERGAN) 25 MG suppository Place 1 suppository (25 mg total) rectally every 6 (six) hours as needed for nausea. 05/05/16 05/05/17  Loleta Rose, MD  QUEtiapine (SEROQUEL) 50 MG tablet Take 100 mg by mouth at bedtime.    [provider]  sucralfate (CARAFATE) 1 g tablet Take 1 tablet (1 g total) by mouth 4 (four) times daily -  with meals and at bedtime. 04/08/16   Jene Every, MD  sulfamethoxazole-trimethoprim (BACTRIM DS,SEPTRA DS) 800-160 MG tablet Take 1 tablet 2 (two) times daily by mouth. 09/05/17   Britlyn Martine, Rulon Eisenmenger B, FNP  traMADol (ULTRAM) 50 MG tablet Take 1 tablet (50 mg total) by mouth every 6 (six) hours as needed. 05/06/16   Lorre Nick, MD  zolpidem (AMBIEN) 10 MG tablet Take 1 tablet (10 mg total) by mouth at bedtime. 03/30/17   Emily Filbert, MD    Allergies Patient has no known allergies.  Family History  Problem Relation Age of Onset  . Lung cancer Father   . Cancer Father        lung cancer  . Alcohol abuse Father   . Depression Mother   . Anxiety disorder Mother   . Arthritis Mother   .  Hyperlipidemia Mother   . Asthma Son   . Lung cancer Paternal Aunt   . Cancer Paternal Aunt        lung  . Bone cancer Paternal Uncle   . Cancer Paternal Uncle        pancreatic  . Hypertension Maternal Grandmother   . Diabetes Paternal Grandmother   . Heart disease Paternal Grandmother   . Hypertension Paternal Grandmother   . Vision loss Neg Hx   . COPD Neg Hx     Social History Social History   Tobacco Use  . Smoking status: Current Every Day Smoker    Packs/day: 1.00    Years: 17.00    Pack years: 17.00    Types: Cigarettes  . Smokeless tobacco: Never Used  Substance Use Topics  . Alcohol use: No  Comment: occasional  . Drug use: No    Review of Systems Constitutional: Negative for fever. Respiratory: Negative for shortness of breath or cough. Gastrointestinal: Negative for abdominal pain; negataive for nausea , negative for vomiting. Genitourinary: Negative for dysuria , Positive for vaginal discharge. Musculoskeletal: Negative for back pain. Lymphatic: Positive for swollen, tender nodes on left groin. Skin: Negative for rash, lesion, or open wound. ____________________________________________   PHYSICAL EXAM:  VITAL SIGNS: ED Triage Vitals  Enc Vitals Group     BP 01/19/18 1538 (!) 147/89     Pulse Rate 01/19/18 1538 100     Resp 01/19/18 1538 18     Temp 01/19/18 1538 98.9 F (37.2 C)     Temp Source 01/19/18 1538 Oral     SpO2 01/19/18 1538 100 %     Weight 01/19/18 1540 185 lb (83.9 kg)     Height 01/19/18 1540 5\' 2"  (1.575 m)     Head Circumference --      Peak Flow --      Pain Score 01/19/18 1539 (S) 5     Pain Loc --      Pain Edu? --      Excl. in GC? --     Constitutional: Alert and oriented. Well appearing and in no acute distress. Eyes: Conjunctivae are normal. PERRL. EOMI. Head: Atraumatic. Nose: No congestion/rhinnorhea. Mouth/Throat: Mucous membranes are moist. Respiratory: Normal respiratory effort.  No  retractions. Gastrointestinal: Abdomen is soft and non-tender without rebound or guarding. Genitourinary: Pelvic exam: Malodorous, thin brown discharge. Musculoskeletal: No extremity tenderness nor edema.  Neurologic:  Normal speech and language. No gross focal neurologic deficits are appreciated. Speech is normal. No gait instability. Skin:  Skin is warm, dry and intact. No rash noted. Psychiatric: Mood and affect are normal. Speech and behavior are normal.  ____________________________________________   LABS (all labs ordered are listed, but only abnormal results are displayed)  Labs Reviewed  WET PREP, GENITAL - Abnormal; Notable for the following components:      Result Value   WBC, Wet Prep HPF POC MODERATE (*)    All other components within normal limits  URINALYSIS, COMPLETE (UACMP) WITH MICROSCOPIC - Abnormal; Notable for the following components:   Color, Urine AMBER (*)    APPearance HAZY (*)    Specific Gravity, Urine 1.033 (*)    Protein, ur 30 (*)    Squamous Epithelial / LPF 0-5 (*)    All other components within normal limits  CHLAMYDIA/NGC RT PCR ([facility] ONLY)   ____________________________________________  RADIOLOGY  Not indicated. ____________________________________________   PROCEDURES  Procedure(s) performed: Not indicated.  ____________________________________________  42 year old female who presents to the emergency department for evaluation and treatment of headache and vaginal discharge. Chart from last visit reviewed. Prescription for flagyl was printed, but patient states that she didn't receive it. Wet prep today was negative, however there is some obvious vaginitis. Prescription was printed and handed directly to the patient with strict instructions to fill it and take it until finished. She was advised to follow up with the health department as well.   INITIAL IMPRESSION / ASSESSMENT AND PLAN / ED COURSE  Pertinent labs & imaging  results that were available during my care of the patient were reviewed by me and considered in my medical decision making (see chart for details).  ____________________________________________   FINAL CLINICAL IMPRESSION(S) / ED DIAGNOSES  Final diagnoses:  Acute vaginitis    Note:  This document was prepared  using Conservation officer, historic buildings and may include unintentional dictation errors.    Chinita Pester, FNP 01/19/18 1908    Jeanmarie Plant, MD 01/20/18 2056

## 2018-01-19 NOTE — ED Notes (Signed)
Pt demanding to talk to pt relations - pt relations and provider notified

## 2018-02-08 ENCOUNTER — Ambulatory Visit (INDEPENDENT_AMBULATORY_CARE_PROVIDER_SITE_OTHER): Payer: Medicaid Other | Admitting: Obstetrics & Gynecology

## 2018-02-08 ENCOUNTER — Telehealth: Payer: Self-pay | Admitting: Obstetrics & Gynecology

## 2018-02-08 ENCOUNTER — Encounter: Payer: Self-pay | Admitting: Obstetrics & Gynecology

## 2018-02-08 VITALS — BP 140/90 | HR 72 | Ht 63.0 in | Wt 185.0 lb

## 2018-02-08 DIAGNOSIS — N852 Hypertrophy of uterus: Secondary | ICD-10-CM | POA: Diagnosis not present

## 2018-02-08 DIAGNOSIS — Z30432 Encounter for removal of intrauterine contraceptive device: Secondary | ICD-10-CM

## 2018-02-08 DIAGNOSIS — R5383 Other fatigue: Secondary | ICD-10-CM | POA: Diagnosis not present

## 2018-02-08 DIAGNOSIS — R21 Rash and other nonspecific skin eruption: Secondary | ICD-10-CM

## 2018-02-08 DIAGNOSIS — Z124 Encounter for screening for malignant neoplasm of cervix: Secondary | ICD-10-CM

## 2018-02-08 MED ORDER — CLOTRIMAZOLE-BETAMETHASONE 1-0.05 % EX CREA: 1.0000 "application " | TOPICAL_CREAM | Freq: Two times a day (BID) | CUTANEOUS | 0 refills | Status: DC

## 2018-02-08 NOTE — Telephone Encounter (Signed)
5/2 at 1120 with [facility] for nexplanon

## 2018-02-08 NOTE — Patient Instructions (Signed)
Betamethasone; Clotrimazole Lotion Apply Twice Daily to affected areas  What is this medicine? BETAMETHASONE; CLOTRIMAZOLE (bay ta METH a sone; kloe TRIM a zole) is a corticosteroid and antifungal lotion. It treats ringworm and infections like jock itch and athlete's foot. It also helps reduce swelling, redness, and itching caused by these infections. This medicine may be used for other purposes; ask your health care provider or pharmacist if you have questions. COMMON BRAND NAME(S): Lotrisone What should I tell my health care provider before I take this medicine? They need to know if you have any of these conditions: -large areas of burned or damaged skin -skin thinning -peripheral vascular disease or poor circulation -an unusual or allergic reaction to betamethasone, clotrimazole, other corticosteroids, other antifungals, other medicines, foods, dyes, or preservatives -pregnant or trying to get pregnant -breast-feeding How should I use this medicine? This lotion is for external use only. Do not take by mouth. Follow the directions on the prescription label. Wash your hands before and after use. If treating hand or nail infections, wash hands before use only. Apply a thin layer of lotion to the affected area and rub in gently. Do not cover or wrap the treated area with an airtight bandage (like a plastic bandage). Use the lotion for the full course of treatment prescribed, even if you think the condition is getting better. Use the medicine at regular intervals. Do not use more often than directed. Do not use on healthy skin or over large areas of skin. Do not use this medicine for any condition other than the one for which it was prescribed. When applying to the groin area, apply a small amount and do not use for longer than 2 weeks unless directed to by your doctor or health care professional. Do not get this lotion in your eyes. If you do, rinse out with plenty of cool tap water. Talk to your  pediatrician regarding the use of this medicine in children. While this drug may be prescribed for children as young as 40 years of age for selected conditions, precautions do apply. Patients over 40 years old may have a stronger reaction and need a smaller dose. Overdosage: If you think you have taken too much of this medicine contact a poison control center or emergency room at once. NOTE: This medicine is only for you. Do not share this medicine with others. What if I miss a dose? If you miss a dose, use it as soon as you can. If it is almost time for your next dose, use only that dose. Do not use double or take extra doses. What may interact with this medicine? -topical products that have nystatin This list may not describe all possible interactions. Give your health care provider a list of all the medicines, herbs, non-prescription drugs, or dietary supplements you use. Also tell them if you smoke, drink alcohol, or use illegal drugs. Some items may interact with your medicine. What should I watch for while using this medicine? If using this medicine on your body or groin tell your doctor or health care professional if your symptoms do not improve within 1 week. If using this medicine on your feet tell your doctor or health care professional if your symptoms do not improve within 2 weeks. Tell your doctor if your skin infection returns after you stop using this lotion. If you are using this lotion for 'jock itch' be sure to dry the groin completely after bathing. Do not wear underwear that is tight-fitting  or made from synthetic fibers like rayon or nylon. Wear loose-fitting, cotton underwear. If you are using this lotion for athlete's foot be sure to dry your feet carefully after bathing, especially between the toes. Do not wear socks made from wool or synthetic materials like rayon or nylon. Wear clean cotton socks and change them at least once a day, change them more if your feet sweat a lot.  Also, try to wear sandals or shoes that are well-ventilated. Do not use this lotion to treat diaper rash. What side effects may I notice from receiving this medicine? Side effects that you should report to your doctor or health care professional as soon as possible: -allergic reactions like skin rash, itching or hives, swelling of the face, lips, or tongue -dark red spots on the skin -lack of healing of skin condition -loss of feeling on skin -painful, red, pus-filled blisters in hair follicles -skin infection -sores or blisters that do not heal properly -thinning of the skin or sunburn Side effects that usually do not require medical attention (report to your doctor or health care professional if they continue or are bothersome): -dry or peeling skin -minor skin irritation, burning, or itching This list may not describe all possible side effects. Call your doctor for medical advice about side effects. You may report side effects to FDA at 1-800-FDA-1088. Where should I keep my medicine? Keep out of the reach of children. Store at room temperature between 15 and 30 degrees C (59 and 86 degrees F). Do not freeze. Throw away any unused medicine after the expiration date. NOTE: This sheet is a summary. It may not cover all possible information. If you have questions about this medicine, talk to your doctor, pharmacist, or health care provider.  2018 [employer]vier/Gold Standard (2008-01-16 13:51:14)

## 2018-02-08 NOTE — Progress Notes (Signed)
History of Present Illness:  Laurie Frederick is a 42 y.o. with multiple concerns today. She had a Paragard IUD placed approximately 12 years ago. Since that time, she states that she has recently noted some vag d/c.  She has irreg periods.  She c/o vulvar itching and irritation the last few mos.  She has been treated twice for BV last month.  She feels hot and cold at times.  She says she feels like she has an infection somewhere in her body.  The following portions of the patient's history were reviewed and updated as appropriate: allergies, current medications, past family history, past medical history, past social history, past surgical history and problem list.  She  has a past medical history of Anemia, Anxiety, Depression, GERD (gastroesophageal reflux disease), H pylori ulcer, Hyperlipidemia, Multiple gastric ulcers, Nontoxic uninodular goiter, Obesity, PTSD (post-traumatic stress disorder), Sleep apnea, and Vitamin D deficiency. She  has a past surgical history that includes Cesarean section; Esophagogastroduodenoscopy (egd) with propofol (Left, 07/07/2015); Cesarean section; Esophagogastroduodenoscopy (egd) with propofol (N/A, 09/05/2015); Dilation and curettage of uterus; and Esophagogastroduodenoscopy (egd) with propofol (N/A, 03/15/2016). She  reports that she has been smoking cigarettes.  She has a 17.00 pack-year smoking history. She has never used smokeless tobacco. She reports that she does not drink alcohol or use drugs. SHe family history includes Alcohol abuse in her father; Anxiety disorder in her mother; Arthritis in her mother; Asthma in her son; Bone cancer in her paternal uncle; Cancer in her father, paternal aunt, and paternal uncle; Depression in her mother; Diabetes in her paternal grandmother; Heart disease in her paternal grandmother; Hyperlipidemia in her mother; Hypertension in her maternal grandmother and paternal grandmother; Lung cancer in her father and paternal  aunt.  Patient Active Problem List   Diagnosis Date Noted  . Overdose 04/11/2016  . Abdominal pain   . Anxiety   . Uncontrollable vomiting   . UTI (lower urinary tract infection) 03/13/2016  . Abdominal pain, epigastric   . Gastritis   . Hypokalemia 08/16/2015  . Chronic prescription benzodiazepine use 08/16/2015  . Intractable nausea and vomiting 07/22/2015  . Epigastric pain   . Helicobacter positive gastritis 07/16/2015  . Gastritis and gastroduodenitis 07/16/2015  . Hyperlipidemia 07/13/2015  . Vitamin D deficiency 07/13/2015  . Thyroid nodule 07/13/2015  . Polyp of gallbladder 07/13/2015  . Liver nodule 07/13/2015  . Nausea & vomiting 07/13/2015  . Gastroenteritis 07/06/2015  . 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 Alcohol abuse 04/01/2015   Medications:  Current Outpatient Medications on File Prior to Visit  Medication Sig Dispense Refill  . citalopram (CELEXA) 40 MG tablet Take 1 tablet (40 mg total) by mouth at bedtime. 30 tablet 1  . sulfamethoxazole-trimethoprim (BACTRIM DS,SEPTRA DS) 800-160 MG tablet Take 1 tablet 2 (two) times daily by mouth. 6 tablet 0  . ALPRAZolam (XANAX) 0.25 MG tablet Take 1 tablet (0.25 mg total) by mouth every 8 (eight) hours as needed for anxiety. (Patient not taking: Reported on 02/08/2018) 10 tablet 0  . buPROPion (WELLBUTRIN XL) 150 MG 24 hr tablet Take 150 mg by mouth daily.    Marland Kitchen. dicyclomine (BENTYL) 20 MG tablet Take 1 tablet (20 mg total) by mouth 3 (three) times daily as needed for spasms. (Patient not taking: Reported on 02/08/2018) 30 tablet 0  . famotidine (PEPCID) 20 MG tablet Take 1 tablet (20 mg total) by mouth 2 (two) times daily. 60 tablet 0  . ibuprofen (ADVIL,MOTRIN) 600 MG tablet Take 1  tablet (600 mg total) every 6 (six) hours as needed by mouth. 30 tablet 0  . ketorolac (TORADOL) 10 MG tablet Take 1 tablet (10 mg total) by mouth every 6 (six) hours as needed. 20 tablet 0  . lisinopril (PRINIVIL,ZESTRIL) 20 MG tablet  Take 20 mg by mouth daily.    . methocarbamol (ROBAXIN-750) 750 MG tablet Take 1 tablet (750 mg total) by mouth 4 (four) times daily. 20 tablet 0  . metoCLOPramide (REGLAN) 10 MG tablet Take 1 tablet (10 mg total) by mouth 4 (four) times daily -  before meals and at bedtime. 60 tablet 0  . metroNIDAZOLE (FLAGYL) 500 MG tablet Take 1 tablet (500 mg total) by mouth 2 (two) times daily. (Patient not taking: Reported on 02/08/2018) 14 tablet 0  . omeprazole (PRILOSEC) 20 MG capsule Take 20 mg by mouth 2 (two) times daily before a meal.    . pantoprazole (PROTONIX) 20 MG tablet Take 20 mg by mouth 2 (two) times daily.    . potassium chloride (K-DUR) 10 MEQ tablet Take 1 tablet (10 mEq total) by mouth daily. (Patient not taking: Reported on 02/08/2018) 5 tablet 0  . potassium chloride SA (KLOR-CON M20) 20 MEQ tablet Take 1 tablet (20 mEq total) by mouth daily. (Patient not taking: Reported on 02/08/2018) 7 tablet 0  . promethazine (PHENERGAN) 25 MG suppository Place 1 suppository (25 mg total) rectally every 6 (six) hours as needed for nausea. 12 suppository 1  . QUEtiapine (SEROQUEL) 50 MG tablet Take 100 mg by mouth at bedtime.    . sucralfate (CARAFATE) 1 g tablet Take 1 tablet (1 g total) by mouth 4 (four) times daily -  with meals and at bedtime. 30 tablet 1  . traMADol (ULTRAM) 50 MG tablet Take 1 tablet (50 mg total) by mouth every 6 (six) hours as needed. (Patient not taking: Reported on 02/08/2018) 15 tablet 0  . zolpidem (AMBIEN) 10 MG tablet Take 1 tablet (10 mg total) by mouth at bedtime. (Patient not taking: Reported on 02/08/2018) 30 tablet 1   Allergies: has No Known Allergies.  Physical Exam:  BP 140/90   Pulse 72   Ht 5\' 3"  (1.6 m)   Wt 185 lb (83.9 kg)   LMP 01/11/2018   BMI 32.77 kg/m  Body mass index is 32.77 kg/m. Physical examination Constitutional NAD, Conversant  HEENT Atraumatic; Op clear with mmm.  Normo-cephalic. Pupils reactive. Anicteric sclerae  Thyroid/Neck Smooth  without nodularity or enlargement. Normal ROM.  Neck Supple.  Skin No rashes, lesions or ulceration. Normal palpated skin turgor. No nodularity.  Breasts: No masses or discharge.  Symmetric.  No axillary adenopathy.  Lungs: Clear to auscultation.No rales or wheezes. Normal Respiratory effort, no retractions.  Heart: NSR.  No murmurs or rubs appreciated. No periferal edema  Abdomen: Soft.  Non-tender.  No masses.  No HSM. No hernia  Extremities: Moves all appropriately.  Normal ROM for age. No lymphadenopathy.  Neuro: Grossly intact  Psych: Oriented to PPT.  Normal mood. Normal affect.     Pelvic:   Vulva: Scaly irritated skin.  No lesions.  Vagina: No lesions or abnormalities noted.  Support: Normal pelvic support.  Urethra No masses tenderness or scarring.  Meatus Normal size without lesions or prolapse.  Cervix: Normal appearance.  No lesions.  Anus: Normal exam.  No lesions.  Perineum: Normal exam.  No lesions.        Bimanual   Uterus: ENLARGED 10 weeks size.  Non-tender.  Mobile.  AV.  Adnexae: No masses.  Non-tender to palpation.  Cul-de-sac: Negative for abnormality.  Also on Pelvic exam:  Two IUD strings present seen coming from the cervical os.  IUD Removal Strings of IUD identified and grasped.  IUD removed without problem.  Pt tolerated this well.  IUD noted to be intact.  Assessment:  1. Encounter for removal of intrauterine contraceptive device (IUD) Done.  Plans Nexplanon after ALL options d/w pt today and info provided.  To schedule.  2. Screening for cervical cancer - As it has been a while - Pap IG (Image Guided) - Also STD testing w that  3. Enlarged uterus - Scheduled an US PELVIS TRANSVANGINAL NON-OB (TV ONLY); Future  4. Vulvar rash Lotrisone topical BID, Rx, Info provided  5. Fatigue, unspecified type - No apparent signs of sepsis as patient thinks she has; will check WBC - Fatigue from blood loss (periods), other - CBC w/Diff  Annamarie Major,  M.D. 02/08/2018 11:12 AM

## 2018-02-09 LAB — CBC WITH DIFFERENTIAL/PLATELET
BASOS ABS: 0 10*3/uL (ref 0.0–0.2)
Basos: 0 %
EOS (ABSOLUTE): 0.5 10*3/uL — AB (ref 0.0–0.4)
Eos: 7 %
Hematocrit: 34.8 % (ref 34.0–46.6)
Hemoglobin: 11.5 g/dL (ref 11.1–15.9)
Immature Grans (Abs): 0 10*3/uL (ref 0.0–0.1)
Immature Granulocytes: 0 %
LYMPHS ABS: 2.6 10*3/uL (ref 0.7–3.1)
Lymphs: 38 %
MCH: 29 pg (ref 26.6–33.0)
MCHC: 33 g/dL (ref 31.5–35.7)
MCV: 88 fL (ref 79–97)
MONOCYTES: 8 %
MONOS ABS: 0.5 10*3/uL (ref 0.1–0.9)
Neutrophils Absolute: 3.2 10*3/uL (ref 1.4–7.0)
Neutrophils: 47 %
Platelets: 246 10*3/uL (ref 150–379)
RBC: 3.97 x10E6/uL (ref 3.77–5.28)
RDW: 15 % (ref 12.3–15.4)
WBC: 6.8 10*3/uL (ref 3.4–10.8)

## 2018-02-11 NOTE — Telephone Encounter (Signed)
Noted. Will order to arrive by apt date/time. 

## 2018-02-13 LAB — PAP IG (IMAGE GUIDED): PAP Smear Comment: 0

## 2018-02-28 ENCOUNTER — Other Ambulatory Visit: Payer: Medicaid Other

## 2018-02-28 ENCOUNTER — Ambulatory Visit: Payer: Medicaid Other | Admitting: Obstetrics & Gynecology

## 2018-04-18 ENCOUNTER — Other Ambulatory Visit: Payer: Self-pay

## 2018-04-18 ENCOUNTER — Emergency Department
Admission: EM | Admit: 2018-04-18 | Discharge: 2018-04-18 | Disposition: A | Discharge status: Home / Self Care | Payer: Medicaid Other | Attending: Emergency Medicine | Admitting: Emergency Medicine

## 2018-04-18 DIAGNOSIS — F17210 Nicotine dependence, cigarettes, uncomplicated: Secondary | ICD-10-CM | POA: Diagnosis not present

## 2018-04-18 DIAGNOSIS — L03314 Cellulitis of groin: Secondary | ICD-10-CM

## 2018-04-18 DIAGNOSIS — Z79899 Other long term (current) drug therapy: Secondary | ICD-10-CM | POA: Diagnosis not present

## 2018-04-18 DIAGNOSIS — R102 Pelvic and perineal pain: Secondary | ICD-10-CM | POA: Diagnosis present

## 2018-04-18 MED ORDER — CEPHALEXIN 500 MG PO CAPS
500.0000 mg | ORAL_CAPSULE | Freq: Once | ORAL | Status: AC
  Administered 2018-04-18: 500 mg via ORAL
  Filled 2018-04-18: qty 1

## 2018-04-18 MED ORDER — BACITRACIN ZINC 500 UNIT/GM EX OINT: TOPICAL_OINTMENT | CUTANEOUS | 0 refills | Status: AC

## 2018-04-18 MED ORDER — CEPHALEXIN 500 MG PO CAPS
500.0000 mg | ORAL_CAPSULE | Freq: Three times a day (TID) | ORAL | 0 refills | Status: AC
Start: 2018-04-18 — End: 2018-04-25

## 2018-04-18 MED ORDER — SULFAMETHOXAZOLE-TRIMETHOPRIM 800-160 MG PO TABS: 1.0000 | ORAL_TABLET | Freq: Two times a day (BID) | ORAL | 0 refills | Status: AC

## 2018-04-18 MED ORDER — SULFAMETHOXAZOLE-TRIMETHOPRIM 800-160 MG PO TABS
1.0000 | ORAL_TABLET | Freq: Once | ORAL | Status: AC
  Administered 2018-04-18: 1 via ORAL
  Filled 2018-04-18: qty 1

## 2018-04-18 NOTE — ED Notes (Signed)
Pt returns to ED lobby, smells heavily of cigarette smoke

## 2018-04-18 NOTE — ED Triage Notes (Signed)
Pt in with co painful nodules on vagina for a month. Has seen her pmd, Gyn, and here for the same. Has been given antibiotics and cream for the same. Unsure of diagnosis, states today she decided to "poke a hole" to the area and it has drained. Pt also concerned about "bed bugs in the vagina".

## 2018-04-18 NOTE — ED Notes (Signed)
Pt using alcohol swab to cleanse vagina, advised against it.

## 2018-04-18 NOTE — ED Notes (Signed)
Pt requesting chaplin to come and speak with her before she is discharged. Chaplin paged.

## 2018-04-18 NOTE — ED Notes (Addendum)
Pt c/o sores in vagina. Pt sts, "I don't have a normal vagina. I have seen 3 different doctors and none of them can tell me what I have." Pt reports she has recently been exposed to bed bugs and sts, "[place] popped one of the sores and blood and puss shot out of it. I was staying with my mom and she has a bedbug problem. You know when you wear a pad down there and it soaks up the blood. Bedbugs like blood and I think maybe they got up in there." MD made aware of this RN's assessment.

## 2018-04-18 NOTE — ED Notes (Signed)
Pt noted leaving ED lobby, st she"told her nurse she is going to her car to get something and will be back"; care nurse Erie NoeVanessa RN called and notified

## 2018-04-18 NOTE — ED Provider Notes (Signed)
[facility] Emergency Department Provider Note  ____________________________________________  Time seen: Approximately 8:21 AM  I have reviewed the triage vital signs and the nursing notes.   HISTORY  Chief Complaint Vaginal Pain   HPI Laurie Frederick is a 42 y.o. female with history as listed below who presents for evaluation of a pain and swelling of her groin.  Patient reports that she has had 3 months of pain and swelling in her groin.  She was seen several times in the emergency room, by OB/GYN, and her primary care doctor.  She was treated twice with Flagyl for vaginitis.  She is currently taking cephalexin for cellulitis.  She is complaining of feeling painful bumps in the groin region bilaterally in the area where she shaves.  No fever or chills.  The pain is constant, sharp, mild and nonradiating.  She has been poking holes in it at home to try to drain it.  Past Medical History:  Diagnosis Date  . Anemia   . Anxiety   . Depression   . GERD (gastroesophageal reflux disease)   . H pylori ulcer   . Hyperlipidemia   . Multiple gastric ulcers   . Nontoxic uninodular goiter   . Obesity   . PTSD (post-traumatic stress disorder)   . Sleep apnea   . Vitamin D deficiency     Patient Active Problem List   Diagnosis Date Noted  . Enlarged uterus 02/08/2018  . Overdose 04/11/2016  . Abdominal pain   . Anxiety   . Uncontrollable vomiting   . UTI (lower urinary tract infection) 03/13/2016  . Abdominal pain, epigastric   . Gastritis   . Hypokalemia 08/16/2015  . Chronic prescription benzodiazepine use 08/16/2015  . Intractable nausea and vomiting 07/22/2015  . Epigastric pain   . Helicobacter positive gastritis 07/16/2015  . Gastritis and gastroduodenitis 07/16/2015  . Hyperlipidemia 07/13/2015  . Vitamin D deficiency 07/13/2015  . Thyroid nodule 07/13/2015  . Polyp of gallbladder 07/13/2015  . Liver nodule 07/13/2015  . Nausea & vomiting  07/13/2015  . Gastroenteritis 07/06/2015  . 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 Alcohol abuse 04/01/2015    Past Surgical History:  Procedure Laterality Date  . CESAREAN SECTION    . CESAREAN SECTION    . DILATION AND CURETTAGE OF UTERUS     x 2 TAB  . ESOPHAGOGASTRODUODENOSCOPY (EGD) WITH PROPOFOL Left 07/07/2015   Procedure: ESOPHAGOGASTRODUODENOSCOPY (EGD) WITH PROPOFOL;  Surgeon: Scot Jun, MD;  Location: [facility] ENDOSCOPY;  Service: Endoscopy;  Laterality: Left;  . ESOPHAGOGASTRODUODENOSCOPY (EGD) WITH PROPOFOL N/A 09/05/2015   Procedure: ESOPHAGOGASTRODUODENOSCOPY (EGD) WITH PROPOFOL;  Surgeon: Midge Minium, MD;  Location: [facility] ENDOSCOPY;  Service: Endoscopy;  Laterality: N/A;  . ESOPHAGOGASTRODUODENOSCOPY (EGD) WITH PROPOFOL N/A 03/15/2016   Procedure: ESOPHAGOGASTRODUODENOSCOPY (EGD) WITH PROPOFOL;  Surgeon: Midge Minium, MD;  Location: [facility] ENDOSCOPY;  Service: Endoscopy;  Laterality: N/A;    Prior to Admission medications   Medication Sig Start Date End Date Taking? Authorizing Provider  ALPRAZolam (XANAX) 0.25 MG tablet Take 1 tablet (0.25 mg total) by mouth every 8 (eight) hours as needed for anxiety. Patient not taking: Reported on 02/08/2018 05/06/16   Lorre Nick, MD  buPROPion (WELLBUTRIN XL) 150 MG 24 hr tablet Take 150 mg by mouth daily.    [provider]  cephALEXin (KEFLEX) 500 MG capsule Take 1 capsule (500 mg total) by mouth 3 (three) times daily for 7 days. 04/18/18 04/25/18  Nita Sickle, MD  citalopram Budd Palmer)  40 MG tablet Take 1 tablet (40 mg total) by mouth at bedtime. 03/30/17   Emily Filbert, MD  clotrimazole-betamethasone (LOTRISONE) cream Apply 1 application topically 2 (two) times daily. 02/08/18   Nadara Mustard, MD  dicyclomine (BENTYL) 20 MG tablet Take 1 tablet (20 mg total) by mouth 3 (three) times daily as needed for spasms. Patient not taking: Reported on 02/08/2018 06/13/16   Sharman Cheek, MD  famotidine (PEPCID) 20  MG tablet Take 1 tablet (20 mg total) by mouth 2 (two) times daily. 06/13/16   Sharman Cheek, MD  ibuprofen (ADVIL,MOTRIN) 600 MG tablet Take 1 tablet (600 mg total) every 6 (six) hours as needed by mouth. 09/05/17   Triplett, Rulon Eisenmenger B, FNP  ketorolac (TORADOL) 10 MG tablet Take 1 tablet (10 mg total) by mouth every 6 (six) hours as needed. 11/08/16   Joni Reining, PA-C  lisinopril (PRINIVIL,ZESTRIL) 20 MG tablet Take 20 mg by mouth daily.    [provider]  methocarbamol (ROBAXIN-750) 750 MG tablet Take 1 tablet (750 mg total) by mouth 4 (four) times daily. 11/08/16   Joni Reining, PA-C  metoCLOPramide (REGLAN) 10 MG tablet Take 1 tablet (10 mg total) by mouth 4 (four) times daily -  before meals and at bedtime. 06/13/16   Sharman Cheek, MD  metroNIDAZOLE (FLAGYL) 500 MG tablet Take 1 tablet (500 mg total) by mouth 2 (two) times daily. Patient not taking: Reported on 02/08/2018 01/19/18   Kem Boroughs B, FNP  omeprazole (PRILOSEC) 20 MG capsule Take 20 mg by mouth 2 (two) times daily before a meal.    [provider]  pantoprazole (PROTONIX) 20 MG tablet Take 20 mg by mouth 2 (two) times daily.    [provider]  potassium chloride (K-DUR) 10 MEQ tablet Take 1 tablet (10 mEq total) by mouth daily. Patient not taking: Reported on 02/08/2018 04/16/16   Sharyn Creamer, MD  potassium chloride SA (KLOR-CON M20) 20 MEQ tablet Take 1 tablet (20 mEq total) by mouth daily. Patient not taking: Reported on 02/08/2018 05/05/16   Loleta Rose, MD  promethazine (PHENERGAN) 25 MG suppository Place 1 suppository (25 mg total) rectally every 6 (six) hours as needed for nausea. 05/05/16 05/05/17  Loleta Rose, MD  QUEtiapine (SEROQUEL) 50 MG tablet Take 100 mg by mouth at bedtime.    [provider]  sucralfate (CARAFATE) 1 g tablet Take 1 tablet (1 g total) by mouth 4 (four) times daily -  with meals and at bedtime. 04/08/16   Jene Every, MD  sulfamethoxazole-trimethoprim  (BACTRIM DS,SEPTRA DS) 800-160 MG tablet Take 1 tablet by mouth 2 (two) times daily for 7 days. 04/18/18 04/25/18  Nita Sickle, MD  traMADol (ULTRAM) 50 MG tablet Take 1 tablet (50 mg total) by mouth every 6 (six) hours as needed. Patient not taking: Reported on 02/08/2018 05/06/16   Lorre Nick, MD  zolpidem (AMBIEN) 10 MG tablet Take 1 tablet (10 mg total) by mouth at bedtime. Patient not taking: Reported on 02/08/2018 03/30/17   Emily Filbert, MD    Allergies Patient has no known allergies.  Family History  Problem Relation Age of Onset  . Lung cancer Father   . Cancer Father        lung cancer  . Alcohol abuse Father   . Depression Mother   . Anxiety disorder Mother   . Arthritis Mother   . Hyperlipidemia Mother   . Asthma Son   . Lung cancer Paternal Aunt   .  Cancer Paternal Aunt        lung  . Bone cancer Paternal Uncle   . Cancer Paternal Uncle        pancreatic  . Hypertension Maternal Grandmother   . Diabetes Paternal Grandmother   . Heart disease Paternal Grandmother   . Hypertension Paternal Grandmother   . Vision loss Neg Hx   . COPD Neg Hx     Social History Social History   Tobacco Use  . Smoking status: Current Every Day Smoker    Packs/day: 1.00    Years: 17.00    Pack years: 17.00    Types: Cigarettes  . Smokeless tobacco: Never Used  Substance Use Topics  . Alcohol use: No    Comment: occasional  . Drug use: No    Review of Systems  Constitutional: Negative for fever. Eyes: Negative for visual changes. ENT: Negative for sore throat. Neck: No neck pain  Cardiovascular: Negative for chest pain. Respiratory: Negative for shortness of breath. Gastrointestinal: Negative for abdominal pain, vomiting or diarrhea. Genitourinary: Negative for dysuria. Musculoskeletal: Negative for back pain. Skin: Negative for rash. + cellulitis of the groin Neurological: Negative for headaches, weakness or numbness. Psych: No SI or  HI  ____________________________________________   PHYSICAL EXAM:  VITAL SIGNS: ED Triage Vitals  Enc Vitals Group     BP 04/18/18 0424 132/83     Pulse Rate 04/18/18 0424 94     Resp 04/18/18 0424 18     Temp 04/18/18 0424 98.2 F (36.8 C)     Temp Source 04/18/18 0424 Oral     SpO2 04/18/18 0424 100 %     Weight 04/18/18 0425 183 lb (83 kg)     Height 04/18/18 0425 5\' 3"  (1.6 m)     Head Circumference --      Peak Flow --      Pain Score 04/18/18 0425 5     Pain Loc --      Pain Edu? --      Excl. in GC? --     Constitutional: Alert and oriented. Well appearing and in no apparent distress. HEENT:      Head: Normocephalic and atraumatic.         Eyes: Conjunctivae are normal. Sclera is non-icteric.       Mouth/Throat: Mucous membranes are moist.       Neck: Supple with no signs of meningismus. Cardiovascular: Regular rate and rhythm. No murmurs, gallops, or rubs. 2+ symmetrical distal pulses are present in all extremities. No JVD. Respiratory: Normal respiratory effort. Lungs are clear to auscultation bilaterally. No wheezes, crackles, or rhonchi.  Gastrointestinal: Soft, non tender, and non distended with positive bowel sounds. No rebound or guarding. Genitourinary: No CVA tenderness. Patient with induration and erythema of b/l groin, no drainable fluid collection Musculoskeletal: Nontender with normal range of motion in all extremities. No edema, cyanosis, or erythema of extremities. Neurologic: Normal speech and language. Face is symmetric. Moving all extremities. No gross focal neurologic deficits are appreciated. Skin: Skin is warm, dry and intact. No rash noted. Psychiatric: Mood and affect are normal. Speech and behavior are normal.  ____________________________________________   LABS (all labs ordered are listed, but only abnormal results are displayed)  Labs Reviewed - No data to display ____________________________________________  EKG  none   ____________________________________________  RADIOLOGY  none  ____________________________________________   PROCEDURES  Procedure(s) performed: None Procedures Critical Care performed:  None ____________________________________________   INITIAL IMPRESSION / ASSESSMENT AND PLAN /  ED COURSE  42 y.o. female with history as listed below who presents for evaluation of a pain and swelling of her groin.  Patient's clinical presentation is consistent with cellulitis of the groin area located in the areas where she shaves.  Patient has been on Keflex.  We will give her another week of Keflex and add Bactrim to it.  There are no clear abscess or pockets that can be drained at this time.  Patient is not septic from this.  Discussed return precautions for worsening infection.      As part of my medical decision making, I reviewed the following data within the electronic MEDICAL RECORD NUMBER Nursing notes reviewed and incorporated, Old chart reviewed, Notes from prior ED visits and [place] Controlled Substance Database    Pertinent labs & imaging results that were available during my care of the patient were reviewed by me and considered in my medical decision making (see chart for details).    ____________________________________________   FINAL CLINICAL IMPRESSION(S) / ED DIAGNOSES  Final diagnoses:  Cellulitis of groin      NEW MEDICATIONS STARTED DURING THIS VISIT:  ED Discharge Orders        Ordered    cephALEXin (KEFLEX) 500 MG capsule  3 times daily     04/18/18 0820    sulfamethoxazole-trimethoprim (BACTRIM DS,SEPTRA DS) 800-160 MG tablet  2 times daily     04/18/18 0820       Note:  This document was prepared using Dragon voice recognition software and may include unintentional dictation errors.    Don Perking, [place], MD 04/18/18 [phone]

## 2018-05-23 NOTE — Telephone Encounter (Signed)
Pt no showed 02/28/18 apt

## 2019-04-11 ENCOUNTER — Emergency Department
Admission: EM | Admit: 2019-04-11 | Discharge: 2019-04-11 | Disposition: A | Discharge status: Home / Self Care | Payer: Medicaid Other | Attending: Emergency Medicine | Admitting: Emergency Medicine

## 2019-04-11 ENCOUNTER — Other Ambulatory Visit: Payer: Self-pay

## 2019-04-11 ENCOUNTER — Encounter: Payer: Self-pay | Admitting: Emergency Medicine

## 2019-04-11 ENCOUNTER — Emergency Department: Payer: Medicaid Other

## 2019-04-11 DIAGNOSIS — R109 Unspecified abdominal pain: Secondary | ICD-10-CM | POA: Diagnosis not present

## 2019-04-11 DIAGNOSIS — Z79899 Other long term (current) drug therapy: Secondary | ICD-10-CM | POA: Diagnosis not present

## 2019-04-11 DIAGNOSIS — F17210 Nicotine dependence, cigarettes, uncomplicated: Secondary | ICD-10-CM | POA: Diagnosis not present

## 2019-04-11 DIAGNOSIS — R3911 Hesitancy of micturition: Secondary | ICD-10-CM | POA: Diagnosis not present

## 2019-04-11 DIAGNOSIS — M545 Low back pain: Secondary | ICD-10-CM | POA: Insufficient documentation

## 2019-04-11 DIAGNOSIS — G8929 Other chronic pain: Secondary | ICD-10-CM | POA: Diagnosis not present

## 2019-04-11 LAB — BASIC METABOLIC PANEL
Anion gap: 7 (ref 5–15)
BUN: 10 mg/dL (ref 6–20)
CO2: 28 mmol/L (ref 22–32)
Calcium: 9.5 mg/dL (ref 8.9–10.3)
Chloride: 105 mmol/L (ref 98–111)
Creatinine, Ser: 0.68 mg/dL (ref 0.44–1.00)
GFR calc Af Amer: >60 mL/min (ref >60)
GFR calc non Af Amer: >60 mL/min (ref >60)
Glucose, Bld: 84 mg/dL (ref 70–99)
Potassium: 4.4 mmol/L (ref 3.5–5.1)
Sodium: 140 mmol/L (ref 135–145)

## 2019-04-11 LAB — CBC
HCT: 37.7 % (ref 36.0–46.0)
Hemoglobin: 12 g/dL (ref 12.0–15.0)
MCH: 29.3 pg (ref 26.0–34.0)
MCHC: 31.8 g/dL (ref 30.0–36.0)
MCV: 92.2 fL (ref 80.0–100.0)
Platelets: 258 10*3/uL (ref 150–400)
RBC: 4.09 MIL/uL (ref 3.87–5.11)
RDW: 13.3 % (ref 11.5–15.5)
WBC: 6.8 10*3/uL (ref 4.0–10.5)
nRBC: 0 % (ref 0.0–0.2)

## 2019-04-11 LAB — URINALYSIS, COMPLETE (UACMP) WITH MICROSCOPIC
Bacteria, UA: NONE SEEN
Bilirubin Urine: NEGATIVE
Glucose, UA: NEGATIVE mg/dL
Hgb urine dipstick: NEGATIVE
Ketones, ur: NEGATIVE mg/dL
Leukocytes,Ua: NEGATIVE
Nitrite: NEGATIVE
Protein, ur: NEGATIVE mg/dL
Specific Gravity, Urine: 1.029 (ref 1.005–1.030)
pH: 5 (ref 5.0–8.0)

## 2019-04-11 LAB — POCT PREGNANCY, URINE: Preg Test, Ur: NEGATIVE

## 2019-04-11 NOTE — ED Notes (Signed)
Pt repeatedly stating that she has to push to urinate, pt repeatedly requesting CT scan. MD speaking at lengths with patient regarding risk vs benefit of CT scan. Pt repeatedly states "I know my body, I know my body, something is wrong, I shouldn't have to push to pee".
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
## 2019-04-11 NOTE — ED Notes (Addendum)
Bladder scan performed by this RN and MD. Bladder scan 4mL.
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
## 2019-04-11 NOTE — Discharge Instructions (Signed)
Your blood tests, urine test, and kidney ultrasound were all normal today.  Please follow up with urology for further evaluation of your symptoms or urinary difficulty.    Your back pain appears to be due to a muscle strain. Please take tylenol 650mg  every 4 hours in addition to your other medicines for this pain.  Intermittent use of a heating pad or application of a capsaicin cream can also be helpful.
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
## 2019-04-11 NOTE — ED Triage Notes (Signed)
Patient presents to the ED with right flank pain x 2 weeks.  Patient reports halting urination.  Patient states she went to her PCP for same issues and states, "I didn't have a good experience with him."  Patient's PCP is Dr. Brunetta Genera.  Patient states pain is severe.

## 2019-04-11 NOTE — ED Notes (Signed)
Patient wants to notify the [facility] [employer].  THis RN is contacting for her.

## 2019-08-17 ENCOUNTER — Emergency Department
Admission: EM | Admit: 2019-08-17 | Discharge: 2019-08-17 | Disposition: A | Discharge status: Home / Self Care | Payer: Medicaid Other | Attending: Emergency Medicine | Admitting: Emergency Medicine

## 2019-08-17 ENCOUNTER — Other Ambulatory Visit: Payer: Self-pay

## 2019-08-17 DIAGNOSIS — F17210 Nicotine dependence, cigarettes, uncomplicated: Secondary | ICD-10-CM | POA: Insufficient documentation

## 2019-08-17 DIAGNOSIS — R21 Rash and other nonspecific skin eruption: Secondary | ICD-10-CM | POA: Insufficient documentation

## 2019-08-17 DIAGNOSIS — K6289 Other specified diseases of anus and rectum: Secondary | ICD-10-CM | POA: Insufficient documentation

## 2019-08-17 DIAGNOSIS — Z79899 Other long term (current) drug therapy: Secondary | ICD-10-CM | POA: Diagnosis not present

## 2019-08-17 DIAGNOSIS — N898 Other specified noninflammatory disorders of vagina: Secondary | ICD-10-CM | POA: Diagnosis present

## 2019-08-17 LAB — URINALYSIS, COMPLETE (UACMP) WITH MICROSCOPIC
Bacteria, UA: NONE SEEN
Bilirubin Urine: NEGATIVE
Glucose, UA: NEGATIVE mg/dL
Hgb urine dipstick: NEGATIVE
Ketones, ur: 5 mg/dL — AB
Leukocytes,Ua: NEGATIVE
Nitrite: NEGATIVE
Protein, ur: 30 mg/dL — AB
Specific Gravity, Urine: 1.027 (ref 1.005–1.030)
pH: 5 (ref 5.0–8.0)

## 2019-08-17 LAB — WET PREP, GENITAL
Clue Cells Wet Prep HPF POC: NONE SEEN
Sperm: NONE SEEN
Trich, Wet Prep: NONE SEEN
Yeast Wet Prep HPF POC: NONE SEEN

## 2019-08-17 LAB — POCT PREGNANCY, URINE: Preg Test, Ur: NEGATIVE

## 2019-08-17 MED ORDER — CEFTRIAXONE SODIUM 250 MG IJ SOLR
250.0000 mg | Freq: Once | INTRAMUSCULAR | Status: AC
  Administered 2019-08-17: 250 mg via INTRAMUSCULAR
  Filled 2019-08-17: qty 250

## 2019-08-17 MED ORDER — CICLOPIROX OLAMINE 0.77 % EX CREA: TOPICAL_CREAM | Freq: Two times a day (BID) | CUTANEOUS | 0 refills | Status: DC

## 2019-08-17 MED ORDER — AZITHROMYCIN 500 MG PO TABS
1000.0000 mg | ORAL_TABLET | Freq: Once | ORAL | Status: AC
  Administered 2019-08-17: 1000 mg via ORAL
  Filled 2019-08-17: qty 2

## 2019-08-17 MED ORDER — VALACYCLOVIR HCL 1 G PO TABS
2000.0000 mg | ORAL_TABLET | Freq: Two times a day (BID) | ORAL | 0 refills | Status: AC
Start: 2019-08-17 — End: 2019-08-19

## 2019-08-17 NOTE — ED Triage Notes (Signed)
Pt states that she was with someone she hadn't been with in awhile, states that she started with symptoms 3 days ago, states that she is having lower pelvic discomfort, states vaginal discharge and discomfort and states rectal discomfort with bumps and wetness

## 2019-08-17 NOTE — ED Provider Notes (Signed)
[facility] Emergency Department Provider Note  ____________________________________________  Time seen: Approximately 5:00 PM  I have reviewed the triage vital signs and the nursing notes.   HISTORY  Chief Complaint Vaginal Discharge and Rectal Pain    HPI Laurie Frederick is a 43 y.o. female presents to the emergency department with concern for a rash along the superior aspect of the intergluteal crease and along the inner aspect of the right and left buttocks.  Patient also states that she has noticed a change in vaginal odor and some pelvic discomfort.  Patient reports that she recently had unprotected sex with a new man that she has been dating.  She is uncertain about possibility of pregnancy as she normally receives Depot shot which she has not been to her PCP for in several months.  Patient had one episode of vomiting yesterday but denies current nausea or vomiting.  No new back pain.  No fever or chills.  No other alleviating measures have been attempted.        Past Medical History:  Diagnosis Date  . Anemia   . Anxiety   . Depression   . GERD (gastroesophageal reflux disease)   . H pylori ulcer   . Hyperlipidemia   . Multiple gastric ulcers   . Nontoxic uninodular goiter   . Obesity   . PTSD (post-traumatic stress disorder)   . Sleep apnea   . Vitamin D deficiency     Patient Active Problem List   Diagnosis Date Noted  . Enlarged uterus 02/08/2018  . Overdose 04/11/2016  . Abdominal pain   . Anxiety   . Uncontrollable vomiting   . UTI (lower urinary tract infection) 03/13/2016  . Abdominal pain, epigastric   . Gastritis   . Hypokalemia 08/16/2015  . Chronic prescription benzodiazepine use 08/16/2015  . Intractable nausea and vomiting 07/22/2015  . Epigastric pain   . Helicobacter positive gastritis 07/16/2015  . Gastritis and gastroduodenitis 07/16/2015  . Hyperlipidemia 07/13/2015  . Vitamin D deficiency 07/13/2015  .  Thyroid nodule 07/13/2015  . Polyp of gallbladder 07/13/2015  . Liver nodule 07/13/2015  . Nausea & vomiting 07/13/2015  . Gastroenteritis 07/06/2015  . 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 Alcohol abuse 04/01/2015    Past Surgical History:  Procedure Laterality Date  . CESAREAN SECTION    . CESAREAN SECTION    . DILATION AND CURETTAGE OF UTERUS     x 2 TAB  . ESOPHAGOGASTRODUODENOSCOPY (EGD) WITH PROPOFOL Left 07/07/2015   Procedure: ESOPHAGOGASTRODUODENOSCOPY (EGD) WITH PROPOFOL;  Surgeon: Scot Jun, MD;  Location: [facility] ENDOSCOPY;  Service: Endoscopy;  Laterality: Left;  . ESOPHAGOGASTRODUODENOSCOPY (EGD) WITH PROPOFOL N/A 09/05/2015   Procedure: ESOPHAGOGASTRODUODENOSCOPY (EGD) WITH PROPOFOL;  Surgeon: Midge Minium, MD;  Location: [facility] ENDOSCOPY;  Service: Endoscopy;  Laterality: N/A;  . ESOPHAGOGASTRODUODENOSCOPY (EGD) WITH PROPOFOL N/A 03/15/2016   Procedure: ESOPHAGOGASTRODUODENOSCOPY (EGD) WITH PROPOFOL;  Surgeon: Midge Minium, MD;  Location: [facility] ENDOSCOPY;  Service: Endoscopy;  Laterality: N/A;    Prior to Admission medications   Medication Sig Start Date End Date Taking? Authorizing Provider  ALPRAZolam (XANAX) 0.25 MG tablet Take 1 tablet (0.25 mg total) by mouth every 8 (eight) hours as needed for anxiety. Patient not taking: Reported on 02/08/2018 05/06/16   Lorre Nick, MD  buPROPion (WELLBUTRIN XL) 150 MG 24 hr tablet Take 150 mg by mouth daily.    [provider]  ciclopirox (LOPROX) 0.77 % cream Apply topically 2 (two) times daily. 08/17/19  Sherral Hammers, Ellison Hughs M, PA-C  citalopram (CELEXA) 40 MG tablet Take 1 tablet (40 mg total) by mouth at bedtime. 03/30/17   Earleen Newport, MD  clotrimazole-betamethasone (LOTRISONE) cream Apply 1 application topically 2 (two) times daily. 02/08/18   Gae Dry, MD  dicyclomine (BENTYL) 20 MG tablet Take 1 tablet (20 mg total) by mouth 3 (three) times daily as needed for spasms. Patient not taking: Reported on  02/08/2018 06/13/16   Carrie Mew, MD  famotidine (PEPCID) 20 MG tablet Take 1 tablet (20 mg total) by mouth 2 (two) times daily. 06/13/16   Carrie Mew, MD  ibuprofen (ADVIL,MOTRIN) 600 MG tablet Take 1 tablet (600 mg total) every 6 (six) hours as needed by mouth. 09/05/17   Triplett, Johnette Abraham B, FNP  ketorolac (TORADOL) 10 MG tablet Take 1 tablet (10 mg total) by mouth every 6 (six) hours as needed. 11/08/16   Sable Feil, PA-C  lisinopril (PRINIVIL,ZESTRIL) 20 MG tablet Take 20 mg by mouth daily.    [provider]  methocarbamol (ROBAXIN-750) 750 MG tablet Take 1 tablet (750 mg total) by mouth 4 (four) times daily. 11/08/16   Sable Feil, PA-C  metoCLOPramide (REGLAN) 10 MG tablet Take 1 tablet (10 mg total) by mouth 4 (four) times daily -  before meals and at bedtime. 06/13/16   Carrie Mew, MD  metroNIDAZOLE (FLAGYL) 500 MG tablet Take 1 tablet (500 mg total) by mouth 2 (two) times daily. Patient not taking: Reported on 02/08/2018 01/19/18   Sherrie George B, FNP  omeprazole (PRILOSEC) 20 MG capsule Take 20 mg by mouth 2 (two) times daily before a meal.    [provider]  pantoprazole (PROTONIX) 20 MG tablet Take 20 mg by mouth 2 (two) times daily.    [provider]  potassium chloride (K-DUR) 10 MEQ tablet Take 1 tablet (10 mEq total) by mouth daily. Patient not taking: Reported on 02/08/2018 04/16/16   Delman Kitten, MD  potassium chloride SA (KLOR-CON M20) 20 MEQ tablet Take 1 tablet (20 mEq total) by mouth daily. Patient not taking: Reported on 02/08/2018 05/05/16   Hinda Kehr, MD  promethazine (PHENERGAN) 25 MG suppository Place 1 suppository (25 mg total) rectally every 6 (six) hours as needed for nausea. 05/05/16 05/05/17  Hinda Kehr, MD  QUEtiapine (SEROQUEL) 50 MG tablet Take 100 mg by mouth at bedtime.    [provider]  sucralfate (CARAFATE) 1 g tablet Take 1 tablet (1 g total) by mouth 4 (four) times daily -  with meals and at bedtime.  04/08/16   Lavonia Drafts, MD  traMADol (ULTRAM) 50 MG tablet Take 1 tablet (50 mg total) by mouth every 6 (six) hours as needed. Patient not taking: Reported on 02/08/2018 05/06/16   Lacretia Leigh, MD  valACYclovir (VALTREX) 1000 MG tablet Take 2 tablets (2,000 mg total) by mouth 2 (two) times daily for 2 days. 08/17/19 08/19/19  Lannie Fields, PA-C  zolpidem (AMBIEN) 10 MG tablet Take 1 tablet (10 mg total) by mouth at bedtime. Patient not taking: Reported on 02/08/2018 03/30/17   Earleen Newport, MD    Allergies Patient has no known allergies.  Family History  Problem Relation Age of Onset  . Lung cancer Father   . Cancer Father        lung cancer  . Alcohol abuse Father   . Depression Mother   . Anxiety disorder Mother   . Arthritis Mother   . Hyperlipidemia Mother   .  Asthma Son   . Lung cancer Paternal Aunt   . Cancer Paternal Aunt        lung  . Bone cancer Paternal Uncle   . Cancer Paternal Uncle        pancreatic  . Hypertension Maternal Grandmother   . Diabetes Paternal Grandmother   . Heart disease Paternal Grandmother   . Hypertension Paternal Grandmother   . Vision loss Neg Hx   . COPD Neg Hx     Social History Social History   Tobacco Use  . Smoking status: Current Every Day Smoker    Packs/day: 1.00    Years: 17.00    Pack years: 17.00    Types: Cigarettes  . Smokeless tobacco: Never Used  Substance Use Topics  . Alcohol use: No    Comment: occasional  . Drug use: No     Review of Systems  Constitutional: No fever/chills Eyes: No visual changes. No discharge ENT: No upper respiratory complaints. Cardiovascular: no chest pain. Respiratory: no cough. No SOB. Gastrointestinal: No abdominal pain.  No nausea, no vomiting.  No diarrhea.  No constipation. Genitourinary: Patient has increased vaginal discharge.  Musculoskeletal: Negative for musculoskeletal pain. Skin: Patient has rash.  Neurological: Negative for headaches, focal weakness or  numbness.   ____________________________________________   PHYSICAL EXAM:  VITAL SIGNS: ED Triage Vitals  Enc Vitals Group     BP 08/17/19 1544 121/77     Pulse Rate 08/17/19 1544 84     Resp 08/17/19 1544 18     Temp 08/17/19 1544 98.6 F (37 C)     Temp Source 08/17/19 1544 Oral     SpO2 08/17/19 1544 99 %     Weight 08/17/19 1545 219 lb (99.3 kg)     Height 08/17/19 1545  (1.6 m)     Head Circumference --      Peak Flow --      Pain Score 08/17/19 1544 8     Pain Loc --      Pain Edu? --      Excl. in GC? --      Constitutional: Alert and oriented. Well appearing and in no acute distress. Eyes: Conjunctivae are normal. PERRL. EOMI. Head: Atraumatic. Cardiovascular: Normal rate, regular rhythm. Normal S1 and S2.  Good peripheral circulation. Respiratory: Normal respiratory effort without tachypnea or retractions. Lungs CTAB. Good air entry to the bases with no decreased or absent breath sounds. Gastrointestinal: Bowel sounds 4 quadrants. Soft and nontender to palpation. No guarding or rigidity. No palpable masses. No distention. No CVA tenderness. Genitourinary: Patient has copious white discharge in vaginal vault. Musculoskeletal: Full range of motion to all extremities. No gross deformities appreciated. Neurologic:  Normal speech and language. No gross focal neurologic deficits are appreciated.  Skin: Patient has ulcerated rash localized to the skin at the superior aspect of the intergluteal crease.  There is a small amount of overlying slough around affected areas.  No purulent drainage.  No vesicle formation.  Rash is irregularly distributed.  No rash around rectum or vagina. Psychiatric: Mood and affect are normal. Speech and behavior are normal. Patient exhibits appropriate insight and judgement.   ____________________________________________   LABS (all labs ordered are listed, but only abnormal results are displayed)  Labs Reviewed  WET PREP, GENITAL -  Abnormal; Notable for the following components:      Result Value   WBC, Wet Prep HPF POC FEW (*)    All other components within normal limits  URINALYSIS, COMPLETE (UACMP) WITH MICROSCOPIC - Abnormal; Notable for the following components:   Color, Urine YELLOW (*)    APPearance HAZY (*)    Ketones, ur 5 (*)    Protein, ur 30 (*)    All other components within normal limits  GC/CHLAMYDIA PROBE AMP  POC URINE PREG, ED  POCT PREGNANCY, URINE   ____________________________________________  EKG   ____________________________________________  RADIOLOGY   No results found.  ____________________________________________    PROCEDURES  Procedure(s) performed:    Procedures    Medications  cefTRIAXone (ROCEPHIN) injection 250 mg (250 mg Intramuscular Given 08/17/19 1807)  azithromycin (ZITHROMAX) tablet 1,000 mg (1,000 mg Oral Given 08/17/19 1808)     ____________________________________________   INITIAL IMPRESSION / ASSESSMENT AND PLAN / ED COURSE  Pertinent labs & imaging results that were available during my care of the patient were reviewed by me and considered in my medical decision making (see chart for details).  Review of the [place] CSRS was performed in accordance of the [facility] prior to dispensing any controlled drugs.         Assessment and Plan:  STD exposure Rash [age] year old female presents to the emergency department with concern for possible STD exposure and a rash that is appeared along the superior aspect of the intergluteal crease.  Vital signs are reassuring at triage.  On physical exam, patient had a tender, burning, ulcerated rash at the superior aspect of the intergluteal crease. There was no surrounding cellulitis or vesicle formation visualized.  Rash is not localized to vagina.  Patient had no cervical motion tenderness on vaginal exam with copious white vaginal discharge visualized in the vault.  Differential diagnosis includes STD, genital  herpes, tinea cruris.  Wet prep revealed no evidence of yeast or BV.  Gonorrhea and Chlamydia testing is pending at this time.  Urinalysis was noncontributory for cystitis.  Patient was treated empirically for gonorrhea and chlamydia with Rocephin and azithromycin.  Informed patient that my differential for rash along buttocks included genital herpes and tinea cruris.  Empirically treated patient for both with Valtrex and ciclopirox topical cream.  Patient was advised to follow-up with local health department for testing for HIV, hepatitis and syphilis.  She voiced understanding  ____________________________________________  FINAL CLINICAL IMPRESSION(S) / ED DIAGNOSES  Final diagnoses:  Rash      NEW MEDICATIONS STARTED DURING THIS VISIT:  ED Discharge Orders         Ordered    valACYclovir (VALTREX) 1000 MG tablet  2 times daily     08/17/19 1804    ciclopirox (LOPROX) 0.77 % cream  2 times daily     08/17/19 1804              This chart was dictated using voice recognition software/Dragon. Despite best efforts to proofread, errors can occur which can change the meaning. Any change was purely unintentional.    Orvil FeilWoods, Chrishaun Sasso M, PA-C 08/17/19 1849    Phineas SemenGoodman, [place], MD 08/17/19 2256

## 2019-08-17 NOTE — ED Notes (Signed)
Pt changing into gown at this time

## 2019-08-17 NOTE — Discharge Instructions (Signed)
Some of your test are still pending.  You have been treated with Rocephin and azithromycin today. Please follow-up with local health department in 2 weeks for STD panel.

## 2019-08-19 LAB — GC/CHLAMYDIA PROBE AMP
Chlamydia trachomatis, NAA: NEGATIVE
Neisseria Gonorrhoeae by PCR: NEGATIVE

## 2019-09-30 DIAGNOSIS — D649 Anemia, unspecified: Secondary | ICD-10-CM | POA: Diagnosis not present

## 2019-09-30 DIAGNOSIS — M545 Low back pain: Secondary | ICD-10-CM | POA: Diagnosis not present

## 2019-09-30 DIAGNOSIS — F419 Anxiety disorder, unspecified: Secondary | ICD-10-CM | POA: Diagnosis not present

## 2019-09-30 DIAGNOSIS — K219 Gastro-esophageal reflux disease without esophagitis: Secondary | ICD-10-CM | POA: Diagnosis not present

## 2019-10-13 ENCOUNTER — Other Ambulatory Visit: Payer: Self-pay

## 2019-10-13 ENCOUNTER — Emergency Department: Payer: No Typology Code available for payment source

## 2019-10-13 ENCOUNTER — Emergency Department
Admission: EM | Admit: 2019-10-13 | Discharge: 2019-10-13 | Disposition: A | Discharge status: Home / Self Care | Payer: No Typology Code available for payment source | Attending: Emergency Medicine | Admitting: Emergency Medicine

## 2019-10-13 ENCOUNTER — Encounter: Payer: Self-pay | Admitting: Emergency Medicine

## 2019-10-13 DIAGNOSIS — Y92410 Unspecified street and highway as the place of occurrence of the external cause: Secondary | ICD-10-CM | POA: Diagnosis not present

## 2019-10-13 DIAGNOSIS — F17210 Nicotine dependence, cigarettes, uncomplicated: Secondary | ICD-10-CM | POA: Diagnosis not present

## 2019-10-13 DIAGNOSIS — S161XXA Strain of muscle, fascia and tendon at neck level, initial encounter: Secondary | ICD-10-CM | POA: Diagnosis not present

## 2019-10-13 DIAGNOSIS — Z20828 Contact with and (suspected) exposure to other viral communicable diseases: Secondary | ICD-10-CM | POA: Diagnosis not present

## 2019-10-13 DIAGNOSIS — R062 Wheezing: Secondary | ICD-10-CM | POA: Diagnosis not present

## 2019-10-13 DIAGNOSIS — Y998 Other external cause status: Secondary | ICD-10-CM | POA: Insufficient documentation

## 2019-10-13 DIAGNOSIS — B9789 Other viral agents as the cause of diseases classified elsewhere: Secondary | ICD-10-CM | POA: Diagnosis not present

## 2019-10-13 DIAGNOSIS — Y9389 Activity, other specified: Secondary | ICD-10-CM | POA: Diagnosis not present

## 2019-10-13 DIAGNOSIS — R0602 Shortness of breath: Secondary | ICD-10-CM | POA: Diagnosis not present

## 2019-10-13 DIAGNOSIS — R05 Cough: Secondary | ICD-10-CM | POA: Diagnosis not present

## 2019-10-13 DIAGNOSIS — M542 Cervicalgia: Secondary | ICD-10-CM | POA: Diagnosis not present

## 2019-10-13 DIAGNOSIS — Z79899 Other long term (current) drug therapy: Secondary | ICD-10-CM | POA: Diagnosis not present

## 2019-10-13 DIAGNOSIS — S199XXA Unspecified injury of neck, initial encounter: Secondary | ICD-10-CM | POA: Diagnosis present

## 2019-10-13 DIAGNOSIS — J069 Acute upper respiratory infection, unspecified: Secondary | ICD-10-CM | POA: Insufficient documentation

## 2019-10-13 LAB — SARS CORONAVIRUS 2 (TAT 6-24 HRS): SARS Coronavirus 2: NEGATIVE

## 2019-10-13 MED ORDER — MELOXICAM 15 MG PO TABS: 15.0000 mg | ORAL_TABLET | Freq: Every day | ORAL | 0 refills | Status: AC
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
## 2019-10-13 NOTE — ED Notes (Signed)
Patient transported to X-ray 
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
## 2019-10-13 NOTE — Discharge Instructions (Addendum)
Call make an appointment with your primary care provider for follow-up.  A Covid test was done in the emergency department today.  You will need to quarantine you and your family until you have heard the results of this test.  If your test is positive you will need to quarantine you in your family for an additional 10 days.  Began to think meloxicam 15 mg 1 daily with food for your neck pain.  Increase fluids.  You may take Tylenol with this medication if additional medicine as needed.

## 2019-10-25 ENCOUNTER — Telehealth: Payer: Self-pay

## 2019-10-25 NOTE — Telephone Encounter (Signed)

## 2019-11-07 DIAGNOSIS — M545 Low back pain: Secondary | ICD-10-CM | POA: Diagnosis not present

## 2019-11-07 DIAGNOSIS — F419 Anxiety disorder, unspecified: Secondary | ICD-10-CM | POA: Diagnosis not present

## 2019-11-07 DIAGNOSIS — K219 Gastro-esophageal reflux disease without esophagitis: Secondary | ICD-10-CM | POA: Diagnosis not present

## 2019-11-07 DIAGNOSIS — N912 Amenorrhea, unspecified: Secondary | ICD-10-CM | POA: Diagnosis not present

## 2019-11-07 DIAGNOSIS — Z716 Tobacco abuse counseling: Secondary | ICD-10-CM | POA: Diagnosis not present

## 2019-11-07 DIAGNOSIS — E7800 Pure hypercholesterolemia, unspecified: Secondary | ICD-10-CM | POA: Diagnosis not present

## 2019-11-07 DIAGNOSIS — D649 Anemia, unspecified: Secondary | ICD-10-CM | POA: Diagnosis not present

## 2019-11-19 DIAGNOSIS — E559 Vitamin D deficiency, unspecified: Secondary | ICD-10-CM | POA: Diagnosis not present

## 2019-11-19 DIAGNOSIS — M129 Arthropathy, unspecified: Secondary | ICD-10-CM | POA: Diagnosis not present

## 2019-11-19 DIAGNOSIS — Z79899 Other long term (current) drug therapy: Secondary | ICD-10-CM | POA: Diagnosis not present

## 2019-11-19 DIAGNOSIS — Z03818 Encounter for observation for suspected exposure to other biological agents ruled out: Secondary | ICD-10-CM | POA: Diagnosis not present

## 2019-11-19 DIAGNOSIS — Z79891 Long term (current) use of opiate analgesic: Secondary | ICD-10-CM | POA: Diagnosis not present

## 2019-12-01 DIAGNOSIS — Z79899 Other long term (current) drug therapy: Secondary | ICD-10-CM | POA: Diagnosis not present

## 2019-12-17 DIAGNOSIS — Z79899 Other long term (current) drug therapy: Secondary | ICD-10-CM | POA: Diagnosis not present

## 2020-01-06 DIAGNOSIS — G8929 Other chronic pain: Secondary | ICD-10-CM | POA: Diagnosis not present

## 2020-01-06 DIAGNOSIS — Z79899 Other long term (current) drug therapy: Secondary | ICD-10-CM | POA: Diagnosis not present

## 2020-01-06 DIAGNOSIS — M545 Low back pain: Secondary | ICD-10-CM | POA: Diagnosis not present

## 2020-03-08 DIAGNOSIS — M545 Low back pain: Secondary | ICD-10-CM | POA: Diagnosis not present

## 2020-03-08 DIAGNOSIS — G8929 Other chronic pain: Secondary | ICD-10-CM | POA: Diagnosis not present

## 2020-03-08 DIAGNOSIS — Z79899 Other long term (current) drug therapy: Secondary | ICD-10-CM | POA: Diagnosis not present

## 2020-03-08 DIAGNOSIS — M79604 Pain in right leg: Secondary | ICD-10-CM | POA: Diagnosis not present

## 2020-03-09 DIAGNOSIS — Z20822 Contact with and (suspected) exposure to covid-19: Secondary | ICD-10-CM | POA: Diagnosis not present

## 2020-03-24 DIAGNOSIS — Z79899 Other long term (current) drug therapy: Secondary | ICD-10-CM | POA: Diagnosis not present

## 2020-03-24 DIAGNOSIS — M545 Low back pain: Secondary | ICD-10-CM | POA: Diagnosis not present

## 2020-03-24 DIAGNOSIS — G8929 Other chronic pain: Secondary | ICD-10-CM | POA: Diagnosis not present

## 2020-03-24 DIAGNOSIS — F419 Anxiety disorder, unspecified: Secondary | ICD-10-CM | POA: Diagnosis not present

## 2020-03-25 DIAGNOSIS — Z131 Encounter for screening for diabetes mellitus: Secondary | ICD-10-CM | POA: Diagnosis not present

## 2020-03-25 DIAGNOSIS — Z114 Encounter for screening for human immunodeficiency virus [HIV]: Secondary | ICD-10-CM | POA: Diagnosis not present

## 2020-03-25 DIAGNOSIS — M129 Arthropathy, unspecified: Secondary | ICD-10-CM | POA: Diagnosis not present

## 2020-03-25 DIAGNOSIS — Z0000 Encounter for general adult medical examination without abnormal findings: Secondary | ICD-10-CM | POA: Diagnosis not present

## 2020-03-25 DIAGNOSIS — E559 Vitamin D deficiency, unspecified: Secondary | ICD-10-CM | POA: Diagnosis not present

## 2020-03-25 DIAGNOSIS — Z13220 Encounter for screening for lipoid disorders: Secondary | ICD-10-CM | POA: Diagnosis not present

## 2020-03-25 DIAGNOSIS — R5383 Other fatigue: Secondary | ICD-10-CM | POA: Diagnosis not present

## 2020-03-25 DIAGNOSIS — Z01419 Encounter for gynecological examination (general) (routine) without abnormal findings: Secondary | ICD-10-CM | POA: Diagnosis not present

## 2020-03-25 DIAGNOSIS — Z13228 Encounter for screening for other metabolic disorders: Secondary | ICD-10-CM | POA: Diagnosis not present

## 2020-04-05 DIAGNOSIS — M79606 Pain in leg, unspecified: Secondary | ICD-10-CM | POA: Diagnosis not present

## 2020-04-05 DIAGNOSIS — M79604 Pain in right leg: Secondary | ICD-10-CM | POA: Diagnosis not present

## 2020-04-07 DIAGNOSIS — R0789 Other chest pain: Secondary | ICD-10-CM | POA: Diagnosis not present

## 2020-04-07 DIAGNOSIS — R0602 Shortness of breath: Secondary | ICD-10-CM | POA: Diagnosis not present

## 2020-04-07 DIAGNOSIS — R9431 Abnormal electrocardiogram [ECG] [EKG]: Secondary | ICD-10-CM | POA: Diagnosis not present

## 2020-04-08 DIAGNOSIS — Z79899 Other long term (current) drug therapy: Secondary | ICD-10-CM | POA: Diagnosis not present

## 2020-05-06 DIAGNOSIS — Z79899 Other long term (current) drug therapy: Secondary | ICD-10-CM | POA: Diagnosis not present

## 2020-05-06 DIAGNOSIS — G8929 Other chronic pain: Secondary | ICD-10-CM | POA: Diagnosis not present

## 2020-05-06 DIAGNOSIS — F419 Anxiety disorder, unspecified: Secondary | ICD-10-CM | POA: Diagnosis not present

## 2020-05-06 DIAGNOSIS — M545 Low back pain: Secondary | ICD-10-CM | POA: Diagnosis not present

## 2020-05-11 ENCOUNTER — Encounter: Payer: Self-pay | Admitting: Emergency Medicine

## 2020-05-11 ENCOUNTER — Emergency Department
Admission: EM | Admit: 2020-05-11 | Discharge: 2020-05-11 | Disposition: A | Discharge status: Home / Self Care | Payer: Medicaid Other | Attending: Emergency Medicine | Admitting: Emergency Medicine

## 2020-05-11 ENCOUNTER — Other Ambulatory Visit: Payer: Self-pay

## 2020-05-11 DIAGNOSIS — I10 Essential (primary) hypertension: Secondary | ICD-10-CM | POA: Diagnosis not present

## 2020-05-11 DIAGNOSIS — F17210 Nicotine dependence, cigarettes, uncomplicated: Secondary | ICD-10-CM | POA: Insufficient documentation

## 2020-05-11 DIAGNOSIS — R112 Nausea with vomiting, unspecified: Secondary | ICD-10-CM | POA: Diagnosis not present

## 2020-05-11 DIAGNOSIS — R1111 Vomiting without nausea: Secondary | ICD-10-CM | POA: Diagnosis not present

## 2020-05-11 DIAGNOSIS — M5489 Other dorsalgia: Secondary | ICD-10-CM | POA: Diagnosis not present

## 2020-05-11 DIAGNOSIS — R1084 Generalized abdominal pain: Secondary | ICD-10-CM | POA: Diagnosis not present

## 2020-05-11 LAB — COMPREHENSIVE METABOLIC PANEL
ALT: 19 U/L (ref 0–44)
AST: 19 U/L (ref 15–41)
Albumin: 4.9 g/dL (ref 3.5–5.0)
Alkaline Phosphatase: 83 U/L (ref 38–126)
Anion gap: 13 (ref 5–15)
BUN: 11 mg/dL (ref 6–20)
CO2: 21 mmol/L — ABNORMAL LOW (ref 22–32)
Calcium: 10.1 mg/dL (ref 8.9–10.3)
Chloride: 107 mmol/L (ref 98–111)
Creatinine, Ser: 0.74 mg/dL (ref 0.44–1.00)
GFR calc Af Amer: >60 mL/min (ref >60)
GFR calc non Af Amer: >60 mL/min (ref >60)
Glucose, Bld: 141 mg/dL — ABNORMAL HIGH (ref 70–99)
Potassium: 3.8 mmol/L (ref 3.5–5.1)
Sodium: 141 mmol/L (ref 135–145)
Total Bilirubin: 0.7 mg/dL (ref 0.3–1.2)
Total Protein: 8.7 g/dL — ABNORMAL HIGH (ref 6.5–8.1)

## 2020-05-11 LAB — CBC
HCT: 41.2 % (ref 36.0–46.0)
Hemoglobin: 13.6 g/dL (ref 12.0–15.0)
MCH: 29.8 pg (ref 26.0–34.0)
MCHC: 33 g/dL (ref 30.0–36.0)
MCV: 90.2 fL (ref 80.0–100.0)
Platelets: 305 10*3/uL (ref 150–400)
RBC: 4.57 MIL/uL (ref 3.87–5.11)
RDW: 13.8 % (ref 11.5–15.5)
WBC: 7.7 10*3/uL (ref 4.0–10.5)
nRBC: 0 % (ref 0.0–0.2)

## 2020-05-11 LAB — LIPASE, BLOOD: Lipase: 23 U/L (ref 11–51)

## 2020-05-11 MED ORDER — SODIUM CHLORIDE 0.9% FLUSH: 3.0000 mL | Freq: Once | INTRAVENOUS | Status: DC

## 2020-05-11 MED ORDER — ONDANSETRON 4 MG PO TBDP: 4.0000 mg | ORAL_TABLET | Freq: Three times a day (TID) | ORAL | 0 refills | Status: AC | PRN

## 2020-05-11 MED ORDER — ONDANSETRON 4 MG PO TBDP
8.0000 mg | ORAL_TABLET | Freq: Once | ORAL | Status: AC
  Administered 2020-05-11: 8 mg via ORAL
  Filled 2020-05-11: qty 2

## 2020-05-11 NOTE — ED Provider Notes (Signed)
[facility] Emergency Department Provider Note   ____________________________________________    I have reviewed the triage vital signs and the nursing notes.   HISTORY  Chief Complaint Abdominal Pain     HPI Laurie Frederick is a 44 y.o. female with significant past history including GERD, anxiety, chronic abdominal pain, intractable nausea and vomiting presents today with nausea and vomiting which started this morning.  Patient reports she takes buprenorphine and oxycodone to prevent her nausea and vomiting.  She apparently is due to pick up her buprenorphine at the pharmacy today but her symptoms started this morning.  She has not take anything for nausea.  Denies fevers or chills.  Denies abdominal pain to me.  Normal stools reported.  Past Medical History:  Diagnosis Date  . Anemia   . Anxiety   . Depression   . GERD (gastroesophageal reflux disease)   . H pylori ulcer   . Hyperlipidemia   . Multiple gastric ulcers   . Nontoxic uninodular goiter   . Obesity   . PTSD (post-traumatic stress disorder)   . Sleep apnea   . Vitamin D deficiency     Patient Active Problem List   Diagnosis Date Noted  . Enlarged uterus 02/08/2018  . Overdose 04/11/2016  . Abdominal pain   . Anxiety   . Uncontrollable vomiting   . UTI (lower urinary tract infection) 03/13/2016  . Abdominal pain, epigastric   . Gastritis   . Hypokalemia 08/16/2015  . Chronic prescription benzodiazepine use 08/16/2015  . Intractable nausea and vomiting 07/22/2015  . Epigastric pain   . Helicobacter positive gastritis 07/16/2015  . Gastritis and gastroduodenitis 07/16/2015  . Hyperlipidemia 07/13/2015  . Vitamin D deficiency 07/13/2015  . Thyroid nodule 07/13/2015  . Polyp of gallbladder 07/13/2015  . Liver nodule 07/13/2015  . Nausea & vomiting 07/13/2015  . Gastroenteritis 07/06/2015  . 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 Alcohol abuse 04/01/2015     Past Surgical History:  Procedure Laterality Date  . CESAREAN SECTION    . CESAREAN SECTION    . DILATION AND CURETTAGE OF UTERUS     x 2 TAB  . ESOPHAGOGASTRODUODENOSCOPY (EGD) WITH PROPOFOL Left 07/07/2015   Procedure: ESOPHAGOGASTRODUODENOSCOPY (EGD) WITH PROPOFOL;  Surgeon: Scot Jun, MD;  Location: [facility] ENDOSCOPY;  Service: Endoscopy;  Laterality: Left;  . ESOPHAGOGASTRODUODENOSCOPY (EGD) WITH PROPOFOL N/A 09/05/2015   Procedure: ESOPHAGOGASTRODUODENOSCOPY (EGD) WITH PROPOFOL;  Surgeon: Midge Minium, MD;  Location: [facility] ENDOSCOPY;  Service: Endoscopy;  Laterality: N/A;  . ESOPHAGOGASTRODUODENOSCOPY (EGD) WITH PROPOFOL N/A 03/15/2016   Procedure: ESOPHAGOGASTRODUODENOSCOPY (EGD) WITH PROPOFOL;  Surgeon: Midge Minium, MD;  Location: [facility] ENDOSCOPY;  Service: Endoscopy;  Laterality: N/A;    Prior to Admission medications   Medication Sig Start Date End Date Taking? Authorizing Provider  buPROPion (WELLBUTRIN XL) 150 MG 24 hr tablet Take 150 mg by mouth daily.    [provider]  ciclopirox (LOPROX) 0.77 % cream Apply topically 2 (two) times daily. 08/17/19   Orvil Feil, PA-C  citalopram (CELEXA) 40 MG tablet Take 1 tablet (40 mg total) by mouth at bedtime. 03/30/17   Emily Filbert, MD  clotrimazole-betamethasone (LOTRISONE) cream Apply 1 application topically 2 (two) times daily. 02/08/18   Nadara Mustard, MD  famotidine (PEPCID) 20 MG tablet Take 1 tablet (20 mg total) by mouth 2 (two) times daily. 06/13/16   Sharman Cheek, MD  lisinopril (PRINIVIL,ZESTRIL) 20 MG tablet Take 20 mg by mouth daily.  [provider]  meloxicam (MOBIC) 15 MG tablet Take 1 tablet (15 mg total) by mouth daily. 10/13/19 10/12/20  Tommi Rumps, PA-C  metoCLOPramide (REGLAN) 10 MG tablet Take 1 tablet (10 mg total) by mouth 4 (four) times daily -  before meals and at bedtime. 06/13/16   Sharman Cheek, MD  omeprazole (PRILOSEC) 20 MG capsule Take 20 mg by mouth 2  (two) times daily before a meal.    [provider]  ondansetron (ZOFRAN ODT) 4 MG disintegrating tablet Take 1 tablet (4 mg total) by mouth every 8 (eight) hours as needed. 05/11/20   Jene Every, MD  pantoprazole (PROTONIX) 20 MG tablet Take 20 mg by mouth 2 (two) times daily.    [provider]  promethazine (PHENERGAN) 25 MG suppository Place 1 suppository (25 mg total) rectally every 6 (six) hours as needed for nausea. 05/05/16 05/05/17  Loleta Rose, MD  QUEtiapine (SEROQUEL) 50 MG tablet Take 100 mg by mouth at bedtime.    [provider]  sucralfate (CARAFATE) 1 g tablet Take 1 tablet (1 g total) by mouth 4 (four) times daily -  with meals and at bedtime. 04/08/16   Jene Every, MD     Allergies Patient has no known allergies.  Family History  Problem Relation Age of Onset  . Lung cancer Father   . Cancer Father        lung cancer  . Alcohol abuse Father   . Depression Mother   . Anxiety disorder Mother   . Arthritis Mother   . Hyperlipidemia Mother   . Asthma Son   . Lung cancer Paternal Aunt   . Cancer Paternal Aunt        lung  . Bone cancer Paternal Uncle   . Cancer Paternal Uncle        pancreatic  . Hypertension Maternal Grandmother   . Diabetes Paternal Grandmother   . Heart disease Paternal Grandmother   . Hypertension Paternal Grandmother   . Vision loss Neg Hx   . COPD Neg Hx     Social History Social History   Tobacco Use  . Smoking status: Current Every Day Smoker    Packs/day: 1.00    Years: 17.00    Pack years: 17.00    Types: Cigarettes  . Smokeless tobacco: Never Used  Substance Use Topics  . Alcohol use: No    Comment: occasional  . Drug use: No    Review of Systems  Constitutional: No fever   Respiratory: No cough Gastrointestinal: As above  Musculoskeletal: No joint swelling Skin: Negative for rash.    ____________________________________________   PHYSICAL EXAM:  VITAL SIGNS: ED Triage  Vitals  Enc Vitals Group     BP 05/11/20 1204 122/85     Pulse Rate 05/11/20 1204 79     Resp 05/11/20 1204 (!) 26     Temp 05/11/20 1204 98.6 F (37 C)     Temp Source 05/11/20 1204 Oral     SpO2 05/11/20 1204 98 %     Weight 05/11/20 1159 99.8 kg (220 lb)     Height 05/11/20 1159 1.6 m (5\' 3" )     Head Circumference --      Peak Flow --      Pain Score 05/11/20 1204 10     Pain Loc --      Pain Edu? --      Excl. in GC? --     Constitutional: Alert and oriented.  Anxious  Nose: No congestion/rhinnorhea. Mouth/Throat: Mucous membranes are moist.    Cardiovascular: Normal rate, regular rhythm. Peri Jefferson peripheral circulation. Respiratory: Normal respiratory effort.  No retractions.  Gastrointestinal: Soft and nontender. No distention.  Reassuring exam Musculoskeletal:   Warm and well perfused Neurologic:  Normal speech and language. No gross focal neurologic deficits are appreciated.  Skin:  Skin is warm, dry and intact. No rash noted. Psychiatric: Mood and affect are normal. Speech and behavior are normal.  ____________________________________________   LABS (all labs ordered are listed, but only abnormal results are displayed)  Labs Reviewed  COMPREHENSIVE METABOLIC PANEL - Abnormal; Notable for the following components:      Result Value   CO2 21 (*)    Glucose, Bld 141 (*)    Total Protein 8.7 (*)    All other components within normal limits  LIPASE, BLOOD  CBC  URINALYSIS, COMPLETE (UACMP) WITH MICROSCOPIC  POC URINE PREG, ED   ____________________________________________  EKG   ____________________________________________  RADIOLOGY   ____________________________________________   PROCEDURES  Procedure(s) performed: No  Procedures   Critical Care performed: No ____________________________________________   INITIAL IMPRESSION / ASSESSMENT AND PLAN / ED COURSE  Pertinent labs & imaging results that were available during my care of the  patient were reviewed by me and considered in my medical decision making (see chart for details).  Patient presents with nausea and vomiting consistent with prior episodes of nausea vomiting that she has had.  She attributes this to running out of buprenorphine and being unable to take her oxycodone.  Abdominal exam is quite reassuring.  No tenderness to palpation to suggest inflammation or surgical abnormality.  White blood cell count is normal.  Lipase is normal.  CMP is unremarkable.  Treated with 8 mg ODT Zofran with significant improvement in nausea.  She is now able to tolerate p.o.'s.  Appropriate for discharge with close outpatient follow-up.    ____________________________________________   FINAL CLINICAL IMPRESSION(S) / ED DIAGNOSES  Final diagnoses:  Non-intractable vomiting with nausea, unspecified vomiting type        Note:  This document was prepared using Dragon voice recognition software and may include unintentional dictation errors.   Jene Every, MD 05/11/20 [phone]

## 2020-05-11 NOTE — ED Notes (Addendum)
While in triage pt attempted to take another home pain pill, this RN attempted to explain to patient that she could not take her home medications while in the ED. This RN explained to patient she was not allowed to take her home meds and that patient would not be able to have pain medications if she continued to take her home meds due to risk of overdosing. Pt repeatedly yelling at this RN that she needed to see a doctor and that she could not go back to lobby, pt repeatedly stating "I have gastritis, I have my pills right here, it's in my chart, it's in my chart". This RN explained patient could not sit in triage room until she was able to go to treatment room. Pt states "you just want me to sit out there so people can look at me because I'm in pain!" While this RN taking patient back to lobby pt attempted to stop this RN by stick her arms and legs out of wheelchair to stop this RN from getting patient through the door. This RN explained to patient she could not do that.   First RN informed, pt placed within eye sight of first RN.

## 2020-05-11 NOTE — ED Notes (Signed)
This RN outside where patient is laying on bench, pt noted to have had episode of emesis on ground under bench. Due to patient taking 1 known dose of home oxy, and attempting to take 2nd dose while in ED, this RN explained to patient the need to lock up home pain medications in pharmacy until discharge. Pt refused with this RN stating, "y'all just want to be hateful". This RN explained to patient needs regarding patient safety at this time. Pt refusing to allow this RN to send home meds to pharmacy. First RN made aware at this time.

## 2020-05-11 NOTE — ED Triage Notes (Signed)
First nurse note- here by EMS for abdominal pain and vomiting.  Per EMS out of buprenorphine.  While being brought to triage EMS reports pt took one of her oxycodones and has more with her. VSS with EMS but they were unable to obtain BP because of pt movement.

## 2020-05-11 NOTE — ED Triage Notes (Signed)
Pt presents to ED via [facility] with c/o abdominal pain and vomiting. Pt states out of buprenorphine 2 mg BID. Pt states abd pain and emesis. Pt states also takes OxyContin for pain. Pt states was unable to take her pain medication today due to being out of buprenorphine. VSS in triage at this time. Pt noted to be tearful and asking repeatedly for buprenorphine upon arrival to triage.    Pt reports vomiting since 0700 this morning.

## 2020-05-11 NOTE — ED Notes (Signed)
Pt ambulated outside and sitting on bench outside.

## 2020-05-11 NOTE — ED Notes (Signed)
E-signature pad unavailable at time of discharge. Pt voices understanding of d/c teaching and paperwork given by this RN. Pt taken to lobby to use phone to call for a ride

## 2020-06-04 DIAGNOSIS — Z79899 Other long term (current) drug therapy: Secondary | ICD-10-CM | POA: Diagnosis not present

## 2020-06-17 DIAGNOSIS — Z79899 Other long term (current) drug therapy: Secondary | ICD-10-CM | POA: Diagnosis not present

## 2020-07-12 DIAGNOSIS — Z79899 Other long term (current) drug therapy: Secondary | ICD-10-CM | POA: Diagnosis not present

## 2020-07-25 DIAGNOSIS — U071 COVID-19: Secondary | ICD-10-CM | POA: Diagnosis not present

## 2020-07-25 DIAGNOSIS — Z20822 Contact with and (suspected) exposure to covid-19: Secondary | ICD-10-CM | POA: Diagnosis not present

## 2020-08-23 DIAGNOSIS — Z1159 Encounter for screening for other viral diseases: Secondary | ICD-10-CM | POA: Diagnosis not present

## 2020-08-23 DIAGNOSIS — R5383 Other fatigue: Secondary | ICD-10-CM | POA: Diagnosis not present

## 2020-08-23 DIAGNOSIS — E7800 Pure hypercholesterolemia, unspecified: Secondary | ICD-10-CM | POA: Diagnosis not present

## 2020-08-23 DIAGNOSIS — Z79899 Other long term (current) drug therapy: Secondary | ICD-10-CM | POA: Diagnosis not present

## 2020-08-23 DIAGNOSIS — E559 Vitamin D deficiency, unspecified: Secondary | ICD-10-CM | POA: Diagnosis not present

## 2020-08-23 DIAGNOSIS — M129 Arthropathy, unspecified: Secondary | ICD-10-CM | POA: Diagnosis not present

## 2020-08-23 DIAGNOSIS — I10 Essential (primary) hypertension: Secondary | ICD-10-CM | POA: Diagnosis not present

## 2020-09-28 DIAGNOSIS — Z79899 Other long term (current) drug therapy: Secondary | ICD-10-CM | POA: Diagnosis not present

## 2020-10-27 DIAGNOSIS — Z79899 Other long term (current) drug therapy: Secondary | ICD-10-CM | POA: Diagnosis not present

## 2020-11-04 DIAGNOSIS — Z79899 Other long term (current) drug therapy: Secondary | ICD-10-CM | POA: Diagnosis not present

## 2020-11-04 DIAGNOSIS — J441 Chronic obstructive pulmonary disease with (acute) exacerbation: Secondary | ICD-10-CM | POA: Diagnosis not present

## 2020-11-04 DIAGNOSIS — Z7251 High risk heterosexual behavior: Secondary | ICD-10-CM | POA: Diagnosis not present

## 2020-11-04 DIAGNOSIS — Z20822 Contact with and (suspected) exposure to covid-19: Secondary | ICD-10-CM | POA: Diagnosis not present

## 2020-11-04 DIAGNOSIS — N898 Other specified noninflammatory disorders of vagina: Secondary | ICD-10-CM | POA: Diagnosis not present

## 2020-11-04 DIAGNOSIS — U071 COVID-19: Secondary | ICD-10-CM | POA: Diagnosis not present

## 2020-12-06 DIAGNOSIS — Z79899 Other long term (current) drug therapy: Secondary | ICD-10-CM | POA: Diagnosis not present

## 2021-01-03 DIAGNOSIS — Z79899 Other long term (current) drug therapy: Secondary | ICD-10-CM | POA: Diagnosis not present

## 2021-01-30 DIAGNOSIS — Z79899 Other long term (current) drug therapy: Secondary | ICD-10-CM | POA: Diagnosis not present

## 2021-02-05 DIAGNOSIS — Z79899 Other long term (current) drug therapy: Secondary | ICD-10-CM | POA: Diagnosis not present

## 2021-03-03 DIAGNOSIS — Z79899 Other long term (current) drug therapy: Secondary | ICD-10-CM | POA: Diagnosis not present

## 2021-03-30 ENCOUNTER — Emergency Department
Admission: EM | Admit: 2021-03-30 | Discharge: 2021-03-30 | Disposition: A | Discharge status: Home / Self Care | Payer: Medicaid Other | Attending: Emergency Medicine | Admitting: Emergency Medicine

## 2021-03-30 ENCOUNTER — Emergency Department: Payer: Medicaid Other

## 2021-03-30 ENCOUNTER — Other Ambulatory Visit: Payer: Self-pay

## 2021-03-30 ENCOUNTER — Encounter: Payer: Self-pay | Admitting: Emergency Medicine

## 2021-03-30 DIAGNOSIS — F17210 Nicotine dependence, cigarettes, uncomplicated: Secondary | ICD-10-CM | POA: Diagnosis not present

## 2021-03-30 DIAGNOSIS — W228XXA Striking against or struck by other objects, initial encounter: Secondary | ICD-10-CM | POA: Diagnosis not present

## 2021-03-30 DIAGNOSIS — S99921A Unspecified injury of right foot, initial encounter: Secondary | ICD-10-CM | POA: Diagnosis present

## 2021-03-30 DIAGNOSIS — M7989 Other specified soft tissue disorders: Secondary | ICD-10-CM | POA: Diagnosis not present

## 2021-03-30 DIAGNOSIS — S92324A Nondisplaced fracture of second metatarsal bone, right foot, initial encounter for closed fracture: Secondary | ICD-10-CM | POA: Insufficient documentation

## 2021-03-30 DIAGNOSIS — S92301A Fracture of unspecified metatarsal bone(s), right foot, initial encounter for closed fracture: Secondary | ICD-10-CM

## 2021-03-30 MED ORDER — NAPROXEN 500 MG PO TABS
500.0000 mg | ORAL_TABLET | Freq: Two times a day (BID) | ORAL | Status: DC
Start: 2021-03-30 — End: 2024-04-25

## 2021-03-30 MED ORDER — NAPROXEN 500 MG PO TABS
500.0000 mg | ORAL_TABLET | Freq: Two times a day (BID) | ORAL | Status: DC
Start: 2021-03-30 — End: 2021-03-30

## 2021-03-30 MED ORDER — OXYCODONE-ACETAMINOPHEN 5-325 MG PO TABS
1.0000 | ORAL_TABLET | Freq: Once | ORAL | Status: AC
  Administered 2021-03-30: 1 via ORAL
  Filled 2021-03-30: qty 1

## 2021-03-30 MED ORDER — IBUPROFEN 600 MG PO TABS
600.0000 mg | ORAL_TABLET | Freq: Once | ORAL | Status: AC
  Administered 2021-03-30: 600 mg via ORAL
  Filled 2021-03-30: qty 1

## 2021-03-30 NOTE — Discharge Instructions (Signed)
Read and follow discharge care instructions.  Ambulate with cam walker.  Be advised medication may cause drowsiness.  Do not drive operate machinery while taking pain medication.
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
## 2021-03-30 NOTE — ED Notes (Signed)
See triage note  Presents with pain and swelling across the top of right foot  Good pulses
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
## 2021-04-01 DIAGNOSIS — Z79899 Other long term (current) drug therapy: Secondary | ICD-10-CM | POA: Diagnosis not present

## 2021-04-24 ENCOUNTER — Emergency Department
Admission: EM | Admit: 2021-04-24 | Discharge: 2021-04-24 | Disposition: A | Discharge status: Home / Self Care | Payer: Medicaid Other | Attending: Student in an Organized Health Care Education/Training Program | Admitting: Student in an Organized Health Care Education/Training Program

## 2021-04-24 DIAGNOSIS — F17210 Nicotine dependence, cigarettes, uncomplicated: Secondary | ICD-10-CM | POA: Diagnosis not present

## 2021-04-24 DIAGNOSIS — R300 Dysuria: Secondary | ICD-10-CM

## 2021-04-24 DIAGNOSIS — R350 Frequency of micturition: Secondary | ICD-10-CM | POA: Insufficient documentation

## 2021-04-24 LAB — CHLAMYDIA/NGC RT PCR (ARMC ONLY)
Chlamydia Tr: NOT DETECTED
N gonorrhoeae: NOT DETECTED

## 2021-04-24 LAB — WET PREP, GENITAL
Sperm: NONE SEEN
Yeast Wet Prep HPF POC: NONE SEEN

## 2021-04-24 LAB — URINALYSIS, COMPLETE (UACMP) WITH MICROSCOPIC
Bilirubin Urine: NEGATIVE
Glucose, UA: NEGATIVE mg/dL
Ketones, ur: NEGATIVE mg/dL
Nitrite: NEGATIVE
Protein, ur: NEGATIVE mg/dL
Specific Gravity, Urine: 1.013 (ref 1.005–1.030)
WBC, UA: >50 WBC/hpf — ABNORMAL HIGH (ref 0–5)
pH: 7 (ref 5.0–8.0)

## 2021-04-24 LAB — POC URINE PREG, ED: Preg Test, Ur: NEGATIVE

## 2021-04-24 MED ORDER — CEPHALEXIN 500 MG PO CAPS: 500.0000 mg | ORAL_CAPSULE | Freq: Four times a day (QID) | ORAL | 0 refills | Status: AC

## 2021-04-24 MED ORDER — METRONIDAZOLE 500 MG PO TABS: 500.0000 mg | ORAL_TABLET | Freq: Two times a day (BID) | ORAL | 0 refills | Status: DC

## 2021-04-24 MED ORDER — METRONIDAZOLE 500 MG PO TABS: 500.0000 mg | ORAL_TABLET | Freq: Two times a day (BID) | ORAL | 0 refills | Status: AC

## 2021-04-24 MED ORDER — DOXYCYCLINE MONOHYDRATE 100 MG PO TABS: 100.0000 mg | ORAL_TABLET | Freq: Two times a day (BID) | ORAL | 0 refills | Status: AC

## 2021-04-24 MED ORDER — DOXYCYCLINE MONOHYDRATE 100 MG PO TABS: 100.0000 mg | ORAL_TABLET | Freq: Two times a day (BID) | ORAL | 0 refills | Status: DC

## 2021-04-24 MED ORDER — CEPHALEXIN 500 MG PO CAPS: 500.0000 mg | ORAL_CAPSULE | Freq: Four times a day (QID) | ORAL | 0 refills | Status: DC

## 2021-04-24 MED ORDER — CEFTRIAXONE SODIUM 1 G IJ SOLR
1.0000 g | Freq: Once | INTRAMUSCULAR | Status: AC
  Administered 2021-04-24: 1 g via INTRAMUSCULAR
  Filled 2021-04-24: qty 10

## 2021-04-24 NOTE — ED Provider Notes (Signed)
[facility]-EMERGENCY DEPARTMENT  ____________________________________________  Time seen: Approximately 5:09 PM  I have reviewed the triage vital signs and the nursing notes.   HISTORY  Chief Complaint Dysuria   Historian Patient     HPI Laurie Frederick is a 45 y.o. female presents to the emergency department with dysuria and increased urinary frequency for the past 4 days.  Patient did recently have unprotected sex.  She denies changes in vaginal discharge.  No vaginal rash.  She denies low back pain, nausea or vomiting.  No fever at home.   Past Medical History:  Diagnosis Date   Anemia    Anxiety    Depression    GERD (gastroesophageal reflux disease)    H pylori ulcer    Hyperlipidemia    Multiple gastric ulcers    Nontoxic uninodular goiter    Obesity    PTSD (post-traumatic stress disorder)    Sleep apnea    Vitamin D deficiency      Immunizations up to date:  Yes.     Past Medical History:  Diagnosis Date   Anemia    Anxiety    Depression    GERD (gastroesophageal reflux disease)    H pylori ulcer    Hyperlipidemia    Multiple gastric ulcers    Nontoxic uninodular goiter    Obesity    PTSD (post-traumatic stress disorder)    Sleep apnea    Vitamin D deficiency     Patient Active Problem List   Diagnosis Date Noted   Enlarged uterus 02/08/2018   Overdose 04/11/2016   Abdominal pain    Anxiety    Uncontrollable vomiting    UTI (lower urinary tract infection) 03/13/2016   Abdominal pain, epigastric    Gastritis    Hypokalemia 08/16/2015   Chronic prescription benzodiazepine use 08/16/2015   Intractable nausea and vomiting 07/22/2015   Epigastric pain    Helicobacter positive gastritis 07/16/2015   Gastritis and gastroduodenitis 07/16/2015   Hyperlipidemia 07/13/2015   Vitamin D deficiency 07/13/2015   Thyroid nodule 07/13/2015   Polyp of gallbladder 07/13/2015   Liver nodule 07/13/2015   Nausea & vomiting 07/13/2015    Gastroenteritis 07/06/2015   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   Alcohol abuse 04/01/2015    Past Surgical History:  Procedure Laterality Date   CESAREAN SECTION     CESAREAN SECTION     DILATION AND CURETTAGE OF UTERUS     x 2 TAB   ESOPHAGOGASTRODUODENOSCOPY (EGD) WITH PROPOFOL Left 07/07/2015   Procedure: ESOPHAGOGASTRODUODENOSCOPY (EGD) WITH PROPOFOL;  Surgeon: Scot Jun, MD;  Location: [facility] ENDOSCOPY;  Service: Endoscopy;  Laterality: Left;   ESOPHAGOGASTRODUODENOSCOPY (EGD) WITH PROPOFOL N/A 09/05/2015   Procedure: ESOPHAGOGASTRODUODENOSCOPY (EGD) WITH PROPOFOL;  Surgeon: Midge Minium, MD;  Location: [facility] ENDOSCOPY;  Service: Endoscopy;  Laterality: N/A;   ESOPHAGOGASTRODUODENOSCOPY (EGD) WITH PROPOFOL N/A 03/15/2016   Procedure: ESOPHAGOGASTRODUODENOSCOPY (EGD) WITH PROPOFOL;  Surgeon: Midge Minium, MD;  Location: [facility] ENDOSCOPY;  Service: Endoscopy;  Laterality: N/A;    Prior to Admission medications   Medication Sig Start Date End Date Taking? Authorizing Provider  buPROPion (WELLBUTRIN XL) 150 MG 24 hr tablet Take 150 mg by mouth daily.    [provider]  cephALEXin (KEFLEX) 500 MG capsule Take 1 capsule (500 mg total) by mouth 4 (four) times daily for 7 days. 04/24/21 05/01/21  Orvil Feil, PA-C  ciclopirox (LOPROX) 0.77 % cream Apply topically 2 (two) times daily. 08/17/19   Orvil Feil, PA-C  citalopram (  CELEXA) 40 MG tablet Take 1 tablet (40 mg total) by mouth at bedtime. 03/30/17   Emily Filbert, MD  clotrimazole-betamethasone (LOTRISONE) cream Apply 1 application topically 2 (two) times daily. 02/08/18   Nadara Mustard, MD  doxycycline (ADOXA) 100 MG tablet Take 1 tablet (100 mg total) by mouth 2 (two) times daily for 7 days. 04/24/21 05/01/21  Orvil Feil, PA-C  famotidine (PEPCID) 20 MG tablet Take 1 tablet (20 mg total) by mouth 2 (two) times daily. 06/13/16   Sharman Cheek, MD  lisinopril (PRINIVIL,ZESTRIL) 20 MG tablet Take 20 mg by  mouth daily.    [provider]  metoCLOPramide (REGLAN) 10 MG tablet Take 1 tablet (10 mg total) by mouth 4 (four) times daily -  before meals and at bedtime. 06/13/16   Sharman Cheek, MD  metroNIDAZOLE (FLAGYL) 500 MG tablet Take 1 tablet (500 mg total) by mouth 2 (two) times daily for 7 days. 04/24/21 05/01/21  Orvil Feil, PA-C  naproxen (NAPROSYN) 500 MG tablet Take 1 tablet (500 mg total) by mouth 2 (two) times daily with a meal. 03/30/21   Joni Reining, PA-C  omeprazole (PRILOSEC) 20 MG capsule Take 20 mg by mouth 2 (two) times daily before a meal.    [provider]  ondansetron (ZOFRAN ODT) 4 MG disintegrating tablet Take 1 tablet (4 mg total) by mouth every 8 (eight) hours as needed. 05/11/20   Jene Every, MD  pantoprazole (PROTONIX) 20 MG tablet Take 20 mg by mouth 2 (two) times daily.    [provider]  promethazine (PHENERGAN) 25 MG suppository Place 1 suppository (25 mg total) rectally every 6 (six) hours as needed for nausea. 05/05/16 05/05/17  Loleta Rose, MD  QUEtiapine (SEROQUEL) 50 MG tablet Take 100 mg by mouth at bedtime.    [provider]  sucralfate (CARAFATE) 1 g tablet Take 1 tablet (1 g total) by mouth 4 (four) times daily -  with meals and at bedtime. 04/08/16   Jene Every, MD    Allergies Patient has no known allergies.  Family History  Problem Relation Age of Onset   Lung cancer Father    Cancer Father        lung cancer   Alcohol abuse Father    Depression Mother    Anxiety disorder Mother    Arthritis Mother    Hyperlipidemia Mother    Asthma Son    Lung cancer Paternal Aunt    Cancer Paternal Aunt        lung   Bone cancer Paternal Uncle    Cancer Paternal Uncle        pancreatic   Hypertension Maternal Grandmother    Diabetes Paternal Grandmother    Heart disease Paternal Grandmother    Hypertension Paternal Grandmother    Vision loss Neg Hx    COPD Neg Hx     Social History Social History    Tobacco Use   Smoking status: Every Day    Packs/day: 1.00    Years: 17.00    Pack years: 17.00    Types: Cigarettes   Smokeless tobacco: Never  Substance Use Topics   Alcohol use: No    Comment: occasional   Drug use: No     Review of Systems  Constitutional: No fever/chills Eyes:  No discharge ENT: No upper respiratory complaints. Respiratory: no cough. No SOB/ use of accessory muscles to breath Gastrointestinal:   No nausea, no vomiting.  No diarrhea.  No constipation. Genitourinary: Patient has dysuria.  Musculoskeletal: Negative for musculoskeletal pain. Skin: Negative for rash, abrasions, lacerations, ecchymosis.    ____________________________________________   PHYSICAL EXAM:  VITAL SIGNS: ED Triage Vitals [04/24/21 1557]  Enc Vitals Group     BP (!) 111/49     Pulse Rate 84     Resp 16     Temp 98.6 F (37 C)     Temp Source Oral     SpO2 98 %     Weight 214 lb (97.1 kg)     Height 5\' 3"  (1.6 m)     Head Circumference      Peak Flow      Pain Score 8     Pain Loc      Pain Edu?      Excl. in GC?      Constitutional: Alert and oriented. Well appearing and in no acute distress. Eyes: Conjunctivae are normal. PERRL. EOMI. Head: Atraumatic. ENT:      Nose: No congestion/rhinnorhea.      Mouth/Throat: Mucous membranes are moist.  Neck: No stridor.  No cervical spine tenderness to palpation. Cardiovascular: Normal rate, regular rhythm. Normal S1 and S2.  Good peripheral circulation. Respiratory: Normal respiratory effort without tachypnea or retractions. Lungs CTAB. Good air entry to the bases with no decreased or absent breath sounds Gastrointestinal: Bowel sounds x 4 quadrants. Soft and nontender to palpation. No guarding or rigidity. No distention. Musculoskeletal: Full range of motion to all extremities. No obvious deformities noted Neurologic:  Normal for age. No gross focal neurologic deficits are appreciated.  Skin:  Skin is warm, dry and  intact. No rash noted. Psychiatric: Mood and affect are normal for age. Speech and behavior are normal.   ____________________________________________   LABS (all labs ordered are listed, but only abnormal results are displayed)  Labs Reviewed  WET PREP, GENITAL - Abnormal; Notable for the following components:      Result Value   Trich, Wet Prep PRESENT (*)    Clue Cells Wet Prep HPF POC PRESENT (*)    WBC, Wet Prep HPF POC MANY (*)    All other components within normal limits  URINALYSIS, COMPLETE (UACMP) WITH MICROSCOPIC - Abnormal; Notable for the following components:   Color, Urine YELLOW (*)    APPearance HAZY (*)    Hgb urine dipstick SMALL (*)    Leukocytes,Ua LARGE (*)    WBC, UA >50 (*)    Bacteria, UA MANY (*)    All other components within normal limits  CHLAMYDIA/NGC RT PCR ([facility] ONLY)            URINE CULTURE  RPR  POC URINE PREG, ED   ____________________________________________  EKG   ____________________________________________  RADIOLOGY   No results found.  ____________________________________________    PROCEDURES  Procedure(s) performed:     Procedures     Medications  cefTRIAXone (ROCEPHIN) injection 1 g (1 g Intramuscular Given 04/24/21 1720)     ____________________________________________   INITIAL IMPRESSION / ASSESSMENT AND PLAN / ED COURSE  Pertinent labs & imaging results that were available during my care of the patient were reviewed by me and considered in my medical decision making (see chart for details).      Assessment and Plan:  Dysuria:  45 year old female presents to the emergency department with dysuria for the past 2 to 3 days.  Vital signs were reassuring at triage.  On physical exam, patient was alert, active and nontoxic-appearing.  Wet  prep tested positive for both trichomoniasis and BV.  Patient received an injection of Rocephin in the emergency department and was discharged with Flagyl,  doxycycline and Keflex.  Urine culture is pending at this time.  Return precautions were given to return with new or worsening symptoms.     ____________________________________________  FINAL CLINICAL IMPRESSION(S) / ED DIAGNOSES  Final diagnoses:  Dysuria      NEW MEDICATIONS STARTED DURING THIS VISIT:  ED Discharge Orders          Ordered    cephALEXin (KEFLEX) 500 MG capsule  4 times daily,   Status:  Discontinued        04/24/21 1801    metroNIDAZOLE (FLAGYL) 500 MG tablet  2 times daily,   Status:  Discontinued        04/24/21 1801    doxycycline (ADOXA) 100 MG tablet  2 times daily,   Status:  Discontinued        04/24/21 1801    cephALEXin (KEFLEX) 500 MG capsule  4 times daily        04/24/21 1807    doxycycline (ADOXA) 100 MG tablet  2 times daily        04/24/21 1807    metroNIDAZOLE (FLAGYL) 500 MG tablet  2 times daily        04/24/21 1807                This chart was dictated using voice recognition software/Dragon. Despite best efforts to proofread, errors can occur which can change the meaning. Any change was purely unintentional.     Orvil FeilWoods, Raekwan Spelman M, PA-C 04/24/21 2250    Willy Eddyobinson, Patrick, MD 04/24/21 2330

## 2021-04-24 NOTE — Discharge Instructions (Addendum)
Take Keflex 4 times a day for the next 7 days. Take doxycycline twice a day for the next 7 days. Take Flagyl twice daily for the next 7 days.

## 2021-04-24 NOTE — ED Triage Notes (Signed)
Pt arrives to ER c/o of painful urination. States x 4 days. States had sexual intercourse and symptoms began after. Worried about STI. States white discharge. A&O, ambulatory.

## 2021-04-25 LAB — RPR: RPR Ser Ql: NONREACTIVE

## 2021-04-27 LAB — URINE CULTURE: Culture: >=100000 — AB

## 2021-05-03 DIAGNOSIS — Z79899 Other long term (current) drug therapy: Secondary | ICD-10-CM | POA: Diagnosis not present

## 2021-05-06 DIAGNOSIS — Z79899 Other long term (current) drug therapy: Secondary | ICD-10-CM | POA: Diagnosis not present

## 2021-06-02 DIAGNOSIS — I10 Essential (primary) hypertension: Secondary | ICD-10-CM | POA: Diagnosis not present

## 2021-06-02 DIAGNOSIS — E559 Vitamin D deficiency, unspecified: Secondary | ICD-10-CM | POA: Diagnosis not present

## 2021-06-02 DIAGNOSIS — M129 Arthropathy, unspecified: Secondary | ICD-10-CM | POA: Diagnosis not present

## 2021-06-02 DIAGNOSIS — Z79899 Other long term (current) drug therapy: Secondary | ICD-10-CM | POA: Diagnosis not present

## 2021-06-08 DIAGNOSIS — Z79899 Other long term (current) drug therapy: Secondary | ICD-10-CM | POA: Diagnosis not present

## 2021-07-03 DIAGNOSIS — Z79899 Other long term (current) drug therapy: Secondary | ICD-10-CM | POA: Diagnosis not present

## 2021-07-08 DIAGNOSIS — Z79899 Other long term (current) drug therapy: Secondary | ICD-10-CM | POA: Diagnosis not present

## 2021-08-01 DIAGNOSIS — Z79899 Other long term (current) drug therapy: Secondary | ICD-10-CM | POA: Diagnosis not present

## 2021-08-05 DIAGNOSIS — Z79899 Other long term (current) drug therapy: Secondary | ICD-10-CM | POA: Diagnosis not present

## 2021-08-10 DIAGNOSIS — F331 Major depressive disorder, recurrent, moderate: Secondary | ICD-10-CM | POA: Diagnosis not present

## 2021-08-11 DIAGNOSIS — F331 Major depressive disorder, recurrent, moderate: Secondary | ICD-10-CM | POA: Diagnosis not present

## 2021-08-12 DIAGNOSIS — F331 Major depressive disorder, recurrent, moderate: Secondary | ICD-10-CM | POA: Diagnosis not present

## 2021-08-16 DIAGNOSIS — F331 Major depressive disorder, recurrent, moderate: Secondary | ICD-10-CM | POA: Diagnosis not present

## 2021-08-18 DIAGNOSIS — F331 Major depressive disorder, recurrent, moderate: Secondary | ICD-10-CM | POA: Diagnosis not present

## 2021-08-19 DIAGNOSIS — F331 Major depressive disorder, recurrent, moderate: Secondary | ICD-10-CM | POA: Diagnosis not present

## 2021-08-27 DIAGNOSIS — F331 Major depressive disorder, recurrent, moderate: Secondary | ICD-10-CM | POA: Diagnosis not present

## 2021-08-29 DIAGNOSIS — F331 Major depressive disorder, recurrent, moderate: Secondary | ICD-10-CM | POA: Diagnosis not present

## 2021-08-29 DIAGNOSIS — Z79899 Other long term (current) drug therapy: Secondary | ICD-10-CM | POA: Diagnosis not present

## 2021-09-01 DIAGNOSIS — F331 Major depressive disorder, recurrent, moderate: Secondary | ICD-10-CM | POA: Diagnosis not present

## 2021-09-01 DIAGNOSIS — Z79899 Other long term (current) drug therapy: Secondary | ICD-10-CM | POA: Diagnosis not present

## 2021-09-03 DIAGNOSIS — F331 Major depressive disorder, recurrent, moderate: Secondary | ICD-10-CM | POA: Diagnosis not present

## 2021-09-14 DIAGNOSIS — F331 Major depressive disorder, recurrent, moderate: Secondary | ICD-10-CM | POA: Diagnosis not present

## 2021-09-16 DIAGNOSIS — F331 Major depressive disorder, recurrent, moderate: Secondary | ICD-10-CM | POA: Diagnosis not present

## 2021-09-19 DIAGNOSIS — F331 Major depressive disorder, recurrent, moderate: Secondary | ICD-10-CM | POA: Diagnosis not present

## 2021-09-20 DIAGNOSIS — F331 Major depressive disorder, recurrent, moderate: Secondary | ICD-10-CM | POA: Diagnosis not present

## 2021-09-28 DIAGNOSIS — F331 Major depressive disorder, recurrent, moderate: Secondary | ICD-10-CM | POA: Diagnosis not present

## 2021-09-28 DIAGNOSIS — Z79899 Other long term (current) drug therapy: Secondary | ICD-10-CM | POA: Diagnosis not present

## 2021-11-01 DIAGNOSIS — Z79899 Other long term (current) drug therapy: Secondary | ICD-10-CM | POA: Diagnosis not present

## 2021-11-03 DIAGNOSIS — Z79899 Other long term (current) drug therapy: Secondary | ICD-10-CM | POA: Diagnosis not present

## 2021-11-18 DIAGNOSIS — D539 Nutritional anemia, unspecified: Secondary | ICD-10-CM | POA: Diagnosis not present

## 2021-11-18 DIAGNOSIS — E559 Vitamin D deficiency, unspecified: Secondary | ICD-10-CM | POA: Diagnosis not present

## 2021-11-18 DIAGNOSIS — I10 Essential (primary) hypertension: Secondary | ICD-10-CM | POA: Diagnosis not present

## 2021-11-18 DIAGNOSIS — R5383 Other fatigue: Secondary | ICD-10-CM | POA: Diagnosis not present

## 2021-11-18 DIAGNOSIS — Z0000 Encounter for general adult medical examination without abnormal findings: Secondary | ICD-10-CM | POA: Diagnosis not present

## 2021-11-18 DIAGNOSIS — M129 Arthropathy, unspecified: Secondary | ICD-10-CM | POA: Diagnosis not present

## 2021-11-18 DIAGNOSIS — Z79899 Other long term (current) drug therapy: Secondary | ICD-10-CM | POA: Diagnosis not present

## 2022-03-24 ENCOUNTER — Other Ambulatory Visit: Payer: Self-pay | Admitting: Neurological Surgery

## 2022-03-24 DIAGNOSIS — M47816 Spondylosis without myelopathy or radiculopathy, lumbar region: Secondary | ICD-10-CM

## 2022-04-07 ENCOUNTER — Ambulatory Visit
Admission: RE | Admit: 2022-04-07 | Discharge: 2022-04-07 | Disposition: A | Discharge status: Home / Self Care | Payer: Medicaid Other | Source: Ambulatory Visit | Attending: Neurological Surgery | Admitting: Neurological Surgery

## 2022-04-07 DIAGNOSIS — M47816 Spondylosis without myelopathy or radiculopathy, lumbar region: Secondary | ICD-10-CM

## 2023-02-16 ENCOUNTER — Other Ambulatory Visit: Payer: Self-pay | Admitting: Physician Assistant

## 2023-02-16 DIAGNOSIS — Z1231 Encounter for screening mammogram for malignant neoplasm of breast: Secondary | ICD-10-CM

## 2023-03-07 ENCOUNTER — Ambulatory Visit: Payer: Medicaid Other

## 2023-03-28 ENCOUNTER — Ambulatory Visit (INDEPENDENT_AMBULATORY_CARE_PROVIDER_SITE_OTHER): Payer: Medicaid Other

## 2023-03-28 ENCOUNTER — Ambulatory Visit
Admission: EM | Admit: 2023-03-28 | Discharge: 2023-03-28 | Disposition: A | Discharge status: Home / Self Care | Payer: Medicaid Other

## 2023-03-28 DIAGNOSIS — M48061 Spinal stenosis, lumbar region without neurogenic claudication: Secondary | ICD-10-CM | POA: Diagnosis not present

## 2023-03-28 DIAGNOSIS — M25552 Pain in left hip: Secondary | ICD-10-CM

## 2023-03-28 DIAGNOSIS — M5136 Other intervertebral disc degeneration, lumbar region: Secondary | ICD-10-CM | POA: Diagnosis not present

## 2023-03-28 MED ORDER — PREDNISONE 50 MG PO TABS: 50.0000 mg | ORAL_TABLET | Freq: Every day | ORAL | 0 refills | Status: DC

## 2023-03-28 MED ORDER — CYCLOBENZAPRINE HCL 5 MG PO TABS: 5.0000 mg | ORAL_TABLET | Freq: Three times a day (TID) | ORAL | 0 refills | Status: DC | PRN

## 2023-03-28 NOTE — ED Provider Notes (Signed)
[facility]  Note:  This document was prepared using Conservation officer, historic buildings and may include unintentional dictation errors.  MRN: [id number] DOB: 09/27/1976  Subjective:   Laurie Frederick is a 47 y.o. female presenting for 4-day history of acute on chronic left sided internal buttock pain, left-sided hip pain.  In general she has chronic low back pain but feels that this may not be related.  No fall, trauma.  Patient does have a history of lumbar stenosis, degenerative disc disease and a bulging disc as seen through an MRI from June 2023.  Has been recommended to have surgery but patient has not pursued this.  Denies fever, weakness, numbness, tingling, radicular symptoms that radiate from the low back into the left leg, n/v, abdominal pain, pelvic pain, rashes, dysuria, urinary frequency, hematuria, vaginal discharge.    No current facility-administered medications for this encounter.  Current Outpatient Medications:    HYDROcodone-acetaminophen (NORCO/VICODIN) 5-325 MG tablet, Take by mouth., Disp: , Rfl:    Buprenorphine HCl-Naloxone HCl (SUBOXONE) 12-3 MG FILM, Place under the tongue., Disp: , Rfl:    buPROPion (WELLBUTRIN XL) 150 MG 24 hr tablet, Take 150 mg by mouth daily., Disp: , Rfl:    ciclopirox (LOPROX) 0.77 % cream, Apply topically 2 (two) times daily., Disp: 15 g, Rfl: 0   citalopram (CELEXA) 40 MG tablet, Take 1 tablet (40 mg total) by mouth at bedtime., Disp: 30 tablet, Rfl: 1   clotrimazole-betamethasone (LOTRISONE) cream, Apply 1 application topically 2 (two) times daily., Disp: 30 g, Rfl: 0   famotidine (PEPCID) 20 MG tablet, Take 1 tablet (20 mg total) by mouth 2 (two) times daily., Disp: 60 tablet, Rfl: 0   gabapentin (NEURONTIN) 300 MG capsule, Take by mouth., Disp: , Rfl:    lisinopril (PRINIVIL,ZESTRIL) 20 MG tablet, Take 20 mg by mouth daily., Disp: , Rfl:    metoCLOPramide (REGLAN) 10 MG tablet, Take 1 tablet (10 mg total) by  mouth 4 (four) times daily -  before meals and at bedtime., Disp: 60 tablet, Rfl: 0   naproxen (NAPROSYN) 500 MG tablet, Take 1 tablet (500 mg total) by mouth 2 (two) times daily with a meal., Disp: 10 tablet, Rfl: 00   omeprazole (PRILOSEC) 20 MG capsule, Take 20 mg by mouth 2 (two) times daily before a meal., Disp: , Rfl:    ondansetron (ZOFRAN ODT) 4 MG disintegrating tablet, Take 1 tablet (4 mg total) by mouth every 8 (eight) hours as needed., Disp: 20 tablet, Rfl: 0   pantoprazole (PROTONIX) 20 MG tablet, Take 20 mg by mouth 2 (two) times daily., Disp: , Rfl:    promethazine (PHENERGAN) 25 MG suppository, Place 1 suppository (25 mg total) rectally every 6 (six) hours as needed for nausea., Disp: 12 suppository, Rfl: 1   QUEtiapine (SEROQUEL) 50 MG tablet, Take 100 mg by mouth at bedtime., Disp: , Rfl:    sucralfate (CARAFATE) 1 g tablet, Take 1 tablet (1 g total) by mouth 4 (four) times daily -  with meals and at bedtime., Disp: 30 tablet, Rfl: 1   tiZANidine (ZANAFLEX) 4 MG tablet, Take 4 mg by mouth 2 (two) times daily as needed., Disp: , Rfl:    No Known Allergies  Past Medical History:  Diagnosis Date   Anemia    Anxiety    Depression    GERD (gastroesophageal reflux disease)    H pylori ulcer    Hyperlipidemia    Multiple gastric ulcers  Nontoxic uninodular goiter    Obesity    PTSD (post-traumatic stress disorder)    Sleep apnea    Vitamin D deficiency      Past Surgical History:  Procedure Laterality Date   CESAREAN SECTION     CESAREAN SECTION     DILATION AND CURETTAGE OF UTERUS     x 2 TAB   ESOPHAGOGASTRODUODENOSCOPY (EGD) WITH PROPOFOL Left 07/07/2015   Procedure: ESOPHAGOGASTRODUODENOSCOPY (EGD) WITH PROPOFOL;  Surgeon: Scot Jun, MD;  Location: [facility] ENDOSCOPY;  Service: Endoscopy;  Laterality: Left;   ESOPHAGOGASTRODUODENOSCOPY (EGD) WITH PROPOFOL N/A 09/05/2015   Procedure: ESOPHAGOGASTRODUODENOSCOPY (EGD) WITH PROPOFOL;  Surgeon: Midge Minium, MD;   Location: [facility] ENDOSCOPY;  Service: Endoscopy;  Laterality: N/A;   ESOPHAGOGASTRODUODENOSCOPY (EGD) WITH PROPOFOL N/A 03/15/2016   Procedure: ESOPHAGOGASTRODUODENOSCOPY (EGD) WITH PROPOFOL;  Surgeon: Midge Minium, MD;  Location: [facility] ENDOSCOPY;  Service: Endoscopy;  Laterality: N/A;    Family History  Problem Relation Age of Onset   Lung cancer Father    Cancer Father        lung cancer   Alcohol abuse Father    Depression Mother    Anxiety disorder Mother    Arthritis Mother    Hyperlipidemia Mother    Asthma Son    Lung cancer Paternal Aunt    Cancer Paternal Aunt        lung   Bone cancer Paternal Uncle    Cancer Paternal Uncle        pancreatic   Hypertension Maternal Grandmother    Diabetes Paternal Grandmother    Heart disease Paternal Grandmother    Hypertension Paternal Grandmother    Vision loss Neg Hx    COPD Neg Hx     Social History   Tobacco Use   Smoking status: Every Day    Packs/day: 1.00    Years: 17.00    Additional pack years: 0.00    Total pack years: 17.00    Types: Cigarettes   Smokeless tobacco: Never  Vaping Use   Vaping Use: Never used  Substance Use Topics   Alcohol use: Yes    Comment: occasional   Drug use: Yes    Frequency: 7.0 times per week    Types: Marijuana    ROS   Objective:   Vitals: BP 136/83 (BP Location: Left Arm)   Pulse 73   Temp 99.3 F (37.4 C) (Oral)   Resp 18   LMP  (Within Weeks) Comment: 1 week  SpO2 98%   Physical Exam Constitutional:      General: She is not in acute distress.    Appearance: Normal appearance. She is well-developed. She is not ill-appearing, toxic-appearing or diaphoretic.  HENT:     Head: Normocephalic and atraumatic.     Nose: Nose normal.     Mouth/Throat:     Mouth: Mucous membranes are moist.  Eyes:     General: No scleral icterus.       Right eye: No discharge.        Left eye: No discharge.     Extraocular Movements: Extraocular movements intact.  Cardiovascular:      Rate and Rhythm: Normal rate.  Pulmonary:     Effort: Pulmonary effort is normal.  Musculoskeletal:        General: No swelling.     Lumbar back: No swelling, edema, deformity, signs of trauma, lacerations, spasms, tenderness or bony tenderness. Normal range of motion. Negative right straight leg raise test and negative left  straight leg raise test. No scoliosis.     Left hip: Tenderness and bony tenderness present. No deformity, lacerations or crepitus. Normal range of motion. Normal strength.       Legs:  Skin:    General: Skin is warm and dry.     Findings: No erythema or rash.     Comments: RN Presnell assisted as a female chaperone.  Neurological:     General: No focal deficit present.     Mental Status: She is alert and oriented to person, place, and time.  Psychiatric:        Mood and Affect: Mood normal.        Behavior: Behavior normal.     DG Hip Unilat With Pelvis 2-3 Views Left  Result Date: 03/28/2023 CLINICAL DATA:  Left hip pain for 3 days. EXAM: DG HIP (WITH OR WITHOUT PELVIS) 2-3V LEFT COMPARISON:  None Available. FINDINGS: There is no evidence of hip fracture or dislocation. There is no evidence of arthropathy or other focal bone abnormality. IMPRESSION: Negative. Electronically Signed   By: Lupita Raider M.D.   On: 03/28/2023 18:15     Assessment and Plan :   PDMP not reviewed this encounter.  1. Left hip pain   2. Spinal stenosis of lumbar region, unspecified whether neurogenic claudication present   3. Lumbar degenerative disc disease    Patient has focal tenderness of the left hip joint.  X-ray was negative.  Suspect that this is related to her chronic spinal stenosis, lumbar degenerative disc disease and is an acute exacerbation involving the left hip joint.  No signs of cellulitis or abscess were noted on exam.  Recommended an oral prednisone course, cyclobenzaprine and very close follow-up with her neuro spine specialist.  Counseled patient on potential  for adverse effects with medications prescribed/recommended today, ER and return-to-clinic precautions discussed, patient verbalized understanding.    Wallis Bamberg, [place] 03/28/23 1850

## 2023-03-28 NOTE — ED Notes (Signed)
Pt returned from X-rays and states she feels a bump in the left side groin area, she think this may be causing the pain.

## 2023-03-28 NOTE — ED Triage Notes (Signed)
Pt reports pain in the "joint left leg" x  3-4 days. Tizanidine, gabapentin and Suboxone gives no relief. Pain is worse when walking.

## 2023-03-28 NOTE — ED Notes (Signed)
In with Mani, PA-C for exam 

## 2023-04-06 ENCOUNTER — Emergency Department (HOSPITAL_BASED_OUTPATIENT_CLINIC_OR_DEPARTMENT_OTHER)
Admission: EM | Admit: 2023-04-06 | Discharge: 2023-04-06 | Disposition: A | Discharge status: Home / Self Care | Payer: Medicaid Other | Attending: Emergency Medicine | Admitting: Emergency Medicine

## 2023-04-06 ENCOUNTER — Encounter (HOSPITAL_BASED_OUTPATIENT_CLINIC_OR_DEPARTMENT_OTHER): Payer: Self-pay | Admitting: Emergency Medicine

## 2023-04-06 DIAGNOSIS — R109 Unspecified abdominal pain: Secondary | ICD-10-CM | POA: Insufficient documentation

## 2023-04-06 DIAGNOSIS — R112 Nausea with vomiting, unspecified: Secondary | ICD-10-CM | POA: Diagnosis present

## 2023-04-06 MED ORDER — FAMOTIDINE 20 MG PO TABS
20.0000 mg | ORAL_TABLET | Freq: Once | ORAL | Status: AC
  Administered 2023-04-06: 20 mg via ORAL
  Filled 2023-04-06: qty 1

## 2023-04-06 MED ORDER — ONDANSETRON HCL 4 MG PO TABS: 4.0000 mg | ORAL_TABLET | Freq: Four times a day (QID) | ORAL | 0 refills | Status: DC

## 2023-04-06 MED ORDER — DICYCLOMINE HCL 20 MG PO TABS: 20.0000 mg | ORAL_TABLET | Freq: Two times a day (BID) | ORAL | 0 refills | Status: DC

## 2023-04-06 MED ORDER — DICYCLOMINE HCL 10 MG/ML IM SOLN
20.0000 mg | Freq: Once | INTRAMUSCULAR | Status: AC
  Administered 2023-04-06: 20 mg via INTRAMUSCULAR
  Filled 2023-04-06: qty 2

## 2023-04-06 MED ORDER — LOPERAMIDE HCL 2 MG PO CAPS: 2.0000 mg | ORAL_CAPSULE | Freq: Four times a day (QID) | ORAL | 0 refills | Status: DC | PRN

## 2023-04-06 MED ORDER — ACETAMINOPHEN 325 MG PO TABS
650.0000 mg | ORAL_TABLET | Freq: Once | ORAL | Status: DC
  Filled 2023-04-06: qty 2

## 2023-04-06 MED ORDER — ONDANSETRON 4 MG PO TBDP
4.0000 mg | ORAL_TABLET | Freq: Once | ORAL | Status: AC
  Administered 2023-04-06: 4 mg via ORAL
  Filled 2023-04-06: qty 1

## 2023-04-06 NOTE — ED Triage Notes (Signed)
Pt here from home with c/o withdrawal from her suboxone but she states that she had one this morning , pt missed her 9 am doctors appointment this morning

## 2023-04-06 NOTE — ED Notes (Signed)
Pt resting , no n/v

## 2023-04-06 NOTE — ED Provider Notes (Signed)
[facility] Provider Note   CSN: [id number] Arrival date & time: 04/06/23  1153     History  Chief Complaint  Patient presents with   Withdrawal    Laurie Frederick is a 47 y.o. female.  Patient is a 47 year old female with a past medical history of opioid use disorder on Suboxone, gastritis, chronic abdominal pain presenting to the emergency department with abdominal pain, nausea and vomiting.  The patient states that she woke up this morning with abdominal pain and nausea and felt like she was in opioid withdrawal.  She states that she did take her Suboxone this morning but had no significant change in symptoms.  She states that she is having diffuse pain.  States she is having associated diarrhea.  She denies any fevers or chills.  She states that her prescribing doctor was going out of town and did not give her a full prescription as usual and she was supposed to follow-up in the office this morning for refill however did not go to the appointment due to her nausea and vomiting.  She states that she is post to be seen tomorrow.  The history is provided by the patient.       Home Medications Prior to Admission medications   Medication Sig Start Date End Date Taking? Authorizing Provider  dicyclomine (BENTYL) 20 MG tablet Take 1 tablet (20 mg total) by mouth 2 (two) times daily. 04/06/23  Yes Elayne Snare K, DO  loperamide (IMODIUM) 2 MG capsule Take 1 capsule (2 mg total) by mouth 4 (four) times daily as needed for diarrhea or loose stools. 04/06/23  Yes Theresia Lo, [place] K, DO  ondansetron (ZOFRAN) 4 MG tablet Take 1 tablet (4 mg total) by mouth every 6 (six) hours. 04/06/23  Yes Theresia Lo, [place] K, DO  Buprenorphine HCl-Naloxone HCl (SUBOXONE) 12-3 MG FILM Place under the tongue.    [provider]  buPROPion (WELLBUTRIN XL) 150 MG 24 hr tablet Take 150 mg by mouth daily.    [provider]  ciclopirox (LOPROX)  0.77 % cream Apply topically 2 (two) times daily. 08/17/19   Orvil Feil, PA-C  citalopram (CELEXA) 40 MG tablet Take 1 tablet (40 mg total) by mouth at bedtime. 03/30/17   Emily Filbert, MD  clotrimazole-betamethasone (LOTRISONE) cream Apply 1 application topically 2 (two) times daily. 02/08/18   Nadara Mustard, MD  cyclobenzaprine (FLEXERIL) 5 MG tablet Take 1 tablet (5 mg total) by mouth 3 (three) times daily as needed for muscle spasms. 03/28/23   Wallis Bamberg, PA-C  famotidine (PEPCID) 20 MG tablet Take 1 tablet (20 mg total) by mouth 2 (two) times daily. 06/13/16   Sharman Cheek, MD  gabapentin (NEURONTIN) 300 MG capsule Take by mouth.    [provider]  HYDROcodone-acetaminophen (NORCO/VICODIN) 5-325 MG tablet Take by mouth. 07/12/15   [provider]  lisinopril (PRINIVIL,ZESTRIL) 20 MG tablet Take 20 mg by mouth daily.    [provider]  metoCLOPramide (REGLAN) 10 MG tablet Take 1 tablet (10 mg total) by mouth 4 (four) times daily -  before meals and at bedtime. 06/13/16   Sharman Cheek, MD  naproxen (NAPROSYN) 500 MG tablet Take 1 tablet (500 mg total) by mouth 2 (two) times daily with a meal. 03/30/21   Joni Reining, PA-C  omeprazole (PRILOSEC) 20 MG capsule Take 20 mg by mouth 2 (two) times daily before a meal.    [provider]  ondansetron (ZOFRAN ODT) 4 MG disintegrating tablet Take 1 tablet (4 mg total) by mouth every 8 (eight) hours as needed. 05/11/20   Jene Every, MD  pantoprazole (PROTONIX) 20 MG tablet Take 20 mg by mouth 2 (two) times daily.    [provider]  predniSONE (DELTASONE) 50 MG tablet Take 1 tablet (50 mg total) by mouth daily with breakfast. 03/28/23   Wallis Bamberg, PA-C  promethazine (PHENERGAN) 25 MG suppository Place 1 suppository (25 mg total) rectally every 6 (six) hours as needed for nausea. 05/05/16 05/05/17  Loleta Rose, MD  QUEtiapine (SEROQUEL) 50 MG tablet Take 100 mg by mouth at bedtime.     [provider]  sucralfate (CARAFATE) 1 g tablet Take 1 tablet (1 g total) by mouth 4 (four) times daily -  with meals and at bedtime. 04/08/16   Jene Every, MD  tiZANidine (ZANAFLEX) 4 MG tablet Take 4 mg by mouth 2 (two) times daily as needed.    [provider]      Allergies    Patient has no known allergies.    Review of Systems   Review of Systems  Physical Exam Updated Vital Signs BP (!) 141/82   Pulse 79   Temp 98.8 F (37.1 C) (Oral)   Resp 18   SpO2 100%  Physical Exam Vitals and nursing note reviewed.  Constitutional:      Appearance: Normal appearance.  HENT:     Head: Normocephalic and atraumatic.     Nose: Nose normal.     Mouth/Throat:     Mouth: Mucous membranes are moist.     Pharynx: Oropharynx is clear.  Eyes:     Extraocular Movements: Extraocular movements intact.     Conjunctiva/sclera: Conjunctivae normal.  Cardiovascular:     Rate and Rhythm: Normal rate and regular rhythm.     Heart sounds: Normal heart sounds.  Pulmonary:     Effort: Pulmonary effort is normal.     Breath sounds: Normal breath sounds.  Abdominal:     General: Abdomen is flat.     Palpations: Abdomen is soft.     Tenderness: There is abdominal tenderness (mild, diffuse).  Musculoskeletal:        General: Normal range of motion.     Cervical back: Normal range of motion.  Skin:    General: Skin is warm and dry.  Neurological:     General: No focal deficit present.     Mental Status: She is alert and oriented to person, place, and time.  Psychiatric:        Mood and Affect: Mood normal.        Behavior: Behavior normal.     ED Results / Procedures / Treatments   Labs (all labs ordered are listed, but only abnormal results are displayed) Labs Reviewed - No data to display  EKG None  Radiology No results found.  Procedures Procedures    Medications Ordered in ED Medications  ondansetron (ZOFRAN-ODT) disintegrating tablet 4 mg (4 mg  Oral Given 04/06/23 1304)  famotidine (PEPCID) tablet 20 mg (20 mg Oral Given 04/06/23 1303)  acetaminophen (TYLENOL) tablet 650 mg (650 mg Oral Given 04/06/23 1304)  dicyclomine (BENTYL) injection 20 mg (20 mg Intramuscular Given 04/06/23 1306)    ED Course/ Medical Decision Making/ A&P Clinical Course as of 04/06/23 1418  Fri Apr 06, 2023  1415 Upon reassessment, patient is asleep in bed resting comfortably. No further vomiting in the ED. She is stable for  discharge home with outpatient follow up. [VK]    Clinical Course User Index [VK] Rexford Maus, DO                             Medical Decision Making This patient presents to the ED with chief complaint(s) of N/V/D with pertinent past medical history of opioid use disorder on suboxone, gastritis, chronic abdominal pain which further complicates the presenting complaint. The complaint involves an extensive differential diagnosis and also carries with it a high risk of complications and morbidity.    The differential diagnosis includes patient did take her Suboxone this morning making an opioid withdrawal less likely, considering gastroenteritis, gastritis, no signs of severe dehydration on exam  Additional history obtained: Additional history obtained from N/A Records reviewed N/A  ED Course and Reassessment: On patient's arrival to the emergency department she is hemodynamically stable in no acute distress.  She did have 1 episode of vomiting in the emergency department.  She has a COWS score of 3 and did take her Suboxone this morning making opioid withdrawal less likely.  I did review the patient's PDMP and her Suboxone was refilled with 63 tabs for 21 days on 5/21 so she should not be ready for another refill at this time.  The patient reported she was only given 42 tabs however this is not consistent with pharmacy fill records.  Patient does not appear to be in acute opioid withdrawal at this time and will be given symptomatic  management with Zofran, Pepcid, Bentyl and Tylenol and will be closely reassessed.  She was recommended to follow-up with her outpatient prescribing doctor for further Suboxone refills.  Independent labs interpretation:  N/A  Independent visualization of imaging: N/A  Consultation: - Consulted or discussed management/test interpretation w/ external professional: N/A  Consideration for admission or further workup: Patient has no emergent conditions requiring admission or further work-up at this time and is stable for discharge home with primary care follow-up  Social Determinants of health: N/A    Risk OTC drugs. Prescription drug management.          Final Clinical Impression(s) / ED Diagnoses Final diagnoses:  Nausea and vomiting, unspecified vomiting type    Rx / DC Orders ED Discharge Orders          Ordered    ondansetron (ZOFRAN) 4 MG tablet  Every 6 hours        04/06/23 1417    dicyclomine (BENTYL) 20 MG tablet  2 times daily        04/06/23 1417    loperamide (IMODIUM) 2 MG capsule  4 times daily PRN        04/06/23 1417              Rexford Maus, DO 04/06/23 1418

## 2023-04-06 NOTE — Discharge Instructions (Addendum)
You were seen in the emergency department for your nausea and vomiting.  You had no other signs of opioid withdrawal and is unlikely that your symptoms were related to opioid withdrawal since you have been taking your medications.  You should continue to take your medications as prescribed and can follow-up with your primary doctor for further medication management and prescription refills.  You can take Zofran as needed for nausea, Tylenol or Bentyl as needed for abdominal pain or cramps and Imodium as needed for diarrhea.  You should take your antacids as prescribed.  You should return to the emergency department if you have significantly worsening pain, repetitive vomiting despite the nausea medicine or any other new or concerning symptoms.

## 2023-06-04 ENCOUNTER — Encounter: Payer: MEDICAID | Admitting: Advanced Practice Midwife

## 2023-09-14 ENCOUNTER — Other Ambulatory Visit: Payer: Self-pay | Admitting: Physician Assistant

## 2023-09-14 DIAGNOSIS — M47816 Spondylosis without myelopathy or radiculopathy, lumbar region: Secondary | ICD-10-CM

## 2023-10-01 ENCOUNTER — Ambulatory Visit (HOSPITAL_COMMUNITY)
Admission: EM | Admit: 2023-10-01 | Discharge: 2023-10-01 | Disposition: A | Discharge status: Home / Self Care | Payer: MEDICAID | Attending: Psychiatry | Admitting: Psychiatry

## 2023-10-01 DIAGNOSIS — F909 Attention-deficit hyperactivity disorder, unspecified type: Secondary | ICD-10-CM | POA: Insufficient documentation

## 2023-10-01 DIAGNOSIS — Z560 Unemployment, unspecified: Secondary | ICD-10-CM | POA: Insufficient documentation

## 2023-10-01 DIAGNOSIS — F419 Anxiety disorder, unspecified: Secondary | ICD-10-CM | POA: Insufficient documentation

## 2023-10-01 DIAGNOSIS — F32A Depression, unspecified: Secondary | ICD-10-CM | POA: Insufficient documentation

## 2023-10-01 DIAGNOSIS — F4310 Post-traumatic stress disorder, unspecified: Secondary | ICD-10-CM | POA: Insufficient documentation

## 2023-10-01 DIAGNOSIS — F1290 Cannabis use, unspecified, uncomplicated: Secondary | ICD-10-CM | POA: Insufficient documentation

## 2023-10-01 DIAGNOSIS — Z760 Encounter for issue of repeat prescription: Secondary | ICD-10-CM | POA: Insufficient documentation

## 2023-10-01 NOTE — Discharge Instructions (Addendum)
You have an appointment with [facility] on the 2cd floor on Wednesday 10/03/2023 @ 3 pm with Dr. Standley Brooking   Based on what you have shared, a list of resources for outpatient therapy and psychiatry is provided below to get you started back on treatment.  It is imperative that you follow through with treatment within 5-7 days from the day of discharge to prevent any further risk to your safety or mental well-being.  You are not limited to the list provided.  In case of an urgent crisis, you may contact the Mobile Crisis Unit with [facility] at [phone].        Outpatient Services for Therapy and Medication Management for [facility] [address], [place], [address] [phone] phone  New Patient Assessment/Therapy Walk-ins Monday and Wednesday: 8am until slots are full. Every 1st and 2nd Friday: 1pm - 5pm  NO ASSESSMENT/THERAPY WALK-INS ON TUESDAYS OR THURSDAYS  New Patient Psychiatry/Medication Management Walk-ins Monday-Friday: 8am-11am  For all walk-ins, we ask that you arrive by 7:30am because patient will be seen in the order of arrival.  Availability is limited; therefore, you may not be seen on the same day that you walk-in.  Our goal is to serve and meet the needs of our community to the best of our ability.   [facility] [address] [place], [place], [address] [phone] phone  [facility] [address] [place], [place], [address] [phone] phone [phone] phone ([address], [address], Anthem/Elevance, [address], [address], [address], [address], Healthy [place], [place], [place], [address], [employer], [place], UHC, American Financial, [place], Out of Network)  [employer], [place] [address] [place], [place], [address] [phone] phone ([place], Anthem/Elevance, [employer] Options/Carelon, BCBS,  [address], [place], [place], [place], [place], [employer], [place], [place], [place], [facility])  [employer] [address] [place], [place], [address] [phone] phone (Medicaid, ask about other insurance)  [facility] [address], [address] [place], [place], [address] [phone] phone [phone] fax ([address], [place] , [place], [place], [place] Health Choice, UHC, General Electric, Self-Pay)  [facility] [address] [place], [place], [address] [phone] phone ([address] Anthem/Elevance, [address], [address], [place], [employer]X Corporation, [place], [place], [place], [employer], [place], [place], [place], [facility])  [facility] - 6-8 MONTH WAIT FOR THERAPY; SOONER FOR MEDICATION MANAGEMENT [address] [place], [place], [address] [phone] phone ([address], AmeriHealth [address] - [place], [address], [place], [place], Friday Health Plans, [address], BCBS Healthy [place], [place], [address], [place], [place], [place], [place], Tricare, UHC, Safeco Corporation, [place])  [facility] [address] [place], [place], [address] [phone] phone  [facility] [address] [place], [place], [address] [phone] phone  [facility] [address] [place], [place], [address] [phone] phone  [facility] [place] - THERAPY ONLY [address], [place], [address] [phone] phone  [facility], [place] [address], [place], [address] [phone] phone  [facility] [address] [place], [place], [address] [phone] phone [phone] fax  [facility] [address] [place], [place], [address] [phone] phone [phone] fax  [facility] [phone] [address] [place], [place], [address] [phone] phone  Jovita Kussmaul [address] [place], [place], [address]  [phone] phone  [facility]  (  Adults Only) [address] [place], [place], [address]  [phone] phone [phone] fax

## 2023-10-01 NOTE — Progress Notes (Signed)
   10/01/23 1008  [facility] Triage Screening (Walk-ins at [facility] only)  How Did You Hear About [place]? Self  What Is the Reason for Your Visit/Call Today? Pt is a 47 yo female who present to [facility] voluntarily seeking medication refill. Pt reports that she left her previous clinic and is currently out of her medications. Pt reports that she was a [facility]. Pt denies SI, HI,AVH. Pt reports using marijuana daily to help with her anxiety.  How Long Has This Been Causing You Problems? 1 wk - 1 month  Have You Recently Had Any Thoughts About Hurting Yourself? No  Are You Planning to Commit Suicide/Harm Yourself At This time? No  Have you Recently Had Thoughts About Hurting Someone Karolee Ohs? No  Are You Planning To Harm Someone At This Time? No  Physical Abuse Denies  Verbal Abuse Denies  Sexual Abuse Denies  Exploitation of patient/patient's resources Denies  Self-Neglect Denies  Possible abuse reported to:  (n/a)  Are you currently experiencing any auditory, visual or other hallucinations? No  Have You Used Any Alcohol or Drugs in the Past 24 Hours? Yes  How long ago did you use Drugs or Alcohol? Marijuana  What Did You Use and How Much? Daily ; amount unknown  Do you have any current medical co-morbidities that require immediate attention? No  Clinician description of patient physical appearance/behavior: Pt presents anxious but cooperative  What Do You Feel Would Help You the Most Today? Medication(s);Stress Management  If access to [facility] was not available, would you have sought care in the Emergency Department? Yes  Determination of Need Routine (7 days)  Options For Referral Medication Management    Flowsheet Row ED from 10/01/2023 in [facility] ED from 04/06/2023 in [facility] Emergency Department at [facility] ED from 03/28/2023 in [facility] Care at [facility])  C-SSRS RISK CATEGORY No Risk No Risk No Risk

## 2023-10-01 NOTE — ED Provider Notes (Signed)
Behavioral Health Urgent Care Medical Screening Exam  Patient Name: Laurie Frederick MRN: [id number] Date of Evaluation: 10/01/23 Chief Complaint:  Seeking medication refills Diagnosis:  Final diagnoses:  Medication refill    History of Present illness: Laurie Frederick is a 47 y.o. female patient presented to [facility] as a walk in unaccompanied seeking medication refills.  Laurie Frederick, 47 y.o., female patient seen face to face by this provider, chart reviewed, on 10/01/23.   On evaluation Laurie Frederick reports she has a past psychiatric history of depression, anxiety, PTSD and ADHD.  She has been prescribed alprazolam 0.5 mg, Celexa, and risperidone in the past.  She has been off her medications for at least 1 week.  States her insurance is changed and she has had difficulty obtaining a provider to manage her medications.  She has been prescribed medications by [facility] medical clinic in the past. She is also prescribed Subutex which is prescribed by a pain management physician.   She lives in the home with her teenage child.  She is unemployed and has recently been granted disability due to medical reasons.  She endorses occasional marijuana use but denies all other substance use. She presents today requesting medication refills.   During evaluation Laurie Frederick is observed sitting in assessment room in no acute distress.  She is well-groomed and makes good eye contact. She is alert/oriented x 4, calm, cooperative, attentive, and responses were relevant and appropriate to assessment questions.  She spoke in a clear tone at moderate volume, and normal pace.She endorses depression and anxiety, but reports her biggest concern is her anxiety.  She denies any concerns with appetite or sleep.  She has a euthymic affect.  She denies SI/HI/AVH.  Objectively there is no evidence of psychosis/mania or delusional thinking.  She conversed coherently, with goal directed thoughts, and no  distractibility, or pre-occupation.  At this time Laurie Frederick is educated and verbalizes understanding of mental health resources and other crisis services in the community. She is instructed to call 911 and present to the nearest emergency room should she experience any suicidal/homicidal ideation, auditory/visual/hallucinations, or detrimental worsening of her mental health condition.   Discussed open access walk-in hours for [facility].  Appointment was made for patient  with [facility] outpatient services for 12/4/024 with Dr. [name] for medication management.   Flowsheet Row ED from 10/01/2023 in [facility] ED from 04/06/2023 in [facility] Emergency Department at [facility] ED from 03/28/2023 in [facility] Care at [facility])  C-SSRS RISK CATEGORY No Risk No Risk No Risk       Psychiatric Specialty Exam  Presentation  General Appearance:Appropriate for Environment; Well Groomed  Eye Contact:Good  Speech:Clear and Coherent; Normal Rate  Speech Volume:Normal  Handedness:Right   Mood and Affect  Mood:Anxious  Affect:Congruent   Thought Process  Thought Processes:Coherent  Descriptions of Associations:Intact  Orientation:Full (Time, Place and Person)  Thought Content:Logical    Hallucinations:None  Ideas of Reference:None  Suicidal Thoughts:No  Homicidal Thoughts:No   Sensorium  Memory:Immediate Good; Recent Good; Remote Good  Judgment:Good  Insight:Good   Executive Functions  Concentration:Good  Attention Span:Good  Recall:Good  Fund of Knowledge:Good  Language:Good   Psychomotor Activity  Psychomotor Activity:Normal   Assets  Assets:Communication Skills; Desire for Improvement; Financial Resources/Insurance; Housing; Leisure Time; Physical Health; Resilience; Transportation   Sleep  Sleep:Good  Number of hours: No data recorded  Physical Exam: Physical  Exam Vitals and nursing note reviewed.  Constitutional:      General: She is not in acute distress.    Appearance: Normal appearance. She is not ill-appearing.  HENT:     Head: Normocephalic.  Eyes:     General:        Right eye: No discharge.        Left eye: No discharge.  Cardiovascular:     Rate and Rhythm: Normal rate.  Pulmonary:     Effort: Pulmonary effort is normal. No respiratory distress.  Musculoskeletal:        General: Normal range of motion.     Cervical back: Normal range of motion.  Skin:    Coloration: Skin is not jaundiced or pale.  Neurological:     Mental Status: She is alert and oriented to person, place, and time.  Psychiatric:        Attention and Perception: Attention and perception normal.        Mood and Affect: Affect normal. Mood is anxious and depressed.        Speech: Speech normal.        Behavior: Behavior normal. Behavior is cooperative.        Thought Content: Thought content normal.        Cognition and Memory: Cognition normal.        Judgment: Judgment normal.    Review of Systems  Constitutional: Negative.   HENT: Negative.    Eyes: Negative.   Respiratory: Negative.    Cardiovascular: Negative.   Musculoskeletal: Negative.   Skin: Negative.   Neurological: Negative.   Psychiatric/Behavioral:  Positive for depression. The patient is nervous/anxious.    Blood pressure (!) 143/85, pulse 92, temperature 99.6 F (37.6 C), temperature source Oral, resp. rate 18, SpO2 100%. There is no height or weight on file to calculate BMI.  Musculoskeletal: Strength & Muscle Tone: within normal limits Gait & Station: normal Patient leans: N/A   [facility] MSE Discharge Disposition for Follow up and Recommendations: Based on my evaluation the patient does not appear to have an emergency medical condition and can be discharged with resources and follow up care in outpatient services for Medication Management and Individual Therapy  Discharge  patient  Provided outpatient psychiatric resources for medication management and therapy.  Appointment made with [facility] outpatient services for 12/4/024 with Dr. [name] for medication management  Ardis Hughs, NP 10/01/2023, 11:05 AM

## 2023-10-02 NOTE — Progress Notes (Unsigned)
Psychiatric Initial Adult Assessment  Patient Identification: Laurie Frederick MRN:  [id number] Date of Evaluation:  10/02/2023 Referral Source: [facility] - [facility] Gambles  Assessment:  Laurie Frederick is a 47 y.o. female with a history of MDD, GAD, PTSD, ADHD who presents in person to [facility] for initial evaluation of medication management.  Patient reports ***  Plan:  # *** Past medication trials:  Status of problem: *** Interventions: -- ***  # *** Past medication trials:  Status of problem: *** Interventions: -- ***  # *** Past medication trials:  Status of problem: *** Interventions: -- ***  Patient was given contact information for behavioral health clinic and was instructed to call 911 for emergencies.   Subjective:  Chief Complaint: No chief complaint on file.  History of Present Illness:  ***  Past Psychiatric History:  Diagnoses: MDD, GAD, PTSD, ADHD Medication trials: xanax 0.5, celexa 40, risperidone, subutex, seroquel 100mg  at bedtime, wellbutrin 150mg  XL daily, zanaflex 4mg  BID, norco/vicodin 5-325, gabapentin 300, flexeril 5mg  TID, suboxone 12-3   -Per PDMP, patient last filled 30 day Rx on 10/9 for xanax 0.5 BID, ambien 5mg  at bedtime. 30 day Rx on 10/29 for oxycodone 10 TID, gabapentin 800 TID. 30 day Rx on 11/8 for subutex 8mg  SL  -Previous medications also include: xanax up to 2mg  BID, ambien 10mg  at bedtime,  Previous psychiatrist/therapist: [facility] Hospitalizations: *** Suicide attempts: *** SIB: *** Hx of violence towards others: *** Current access to guns: *** Hx of trauma/abuse: ***  Substance Abuse History in the last 12 months:  {yes no:314532} -occasional THC use   Past Medical History:  Past Medical History:  Diagnosis Date   Anemia    Anxiety    Depression    GERD (gastroesophageal reflux disease)    H pylori ulcer    Hyperlipidemia    Multiple gastric ulcers    Nontoxic uninodular  goiter    Obesity    PTSD (post-traumatic stress disorder)    Sleep apnea    Vitamin D deficiency     Past Surgical History:  Procedure Laterality Date   CESAREAN SECTION     CESAREAN SECTION     DILATION AND CURETTAGE OF UTERUS     x 2 TAB   ESOPHAGOGASTRODUODENOSCOPY (EGD) WITH PROPOFOL Left 07/07/2015   Procedure: ESOPHAGOGASTRODUODENOSCOPY (EGD) WITH PROPOFOL;  Surgeon: Scot Jun, MD;  Location: [facility] ENDOSCOPY;  Service: Endoscopy;  Laterality: Left;   ESOPHAGOGASTRODUODENOSCOPY (EGD) WITH PROPOFOL N/A 09/05/2015   Procedure: ESOPHAGOGASTRODUODENOSCOPY (EGD) WITH PROPOFOL;  Surgeon: Midge Minium, MD;  Location: [facility] ENDOSCOPY;  Service: Endoscopy;  Laterality: N/A;   ESOPHAGOGASTRODUODENOSCOPY (EGD) WITH PROPOFOL N/A 03/15/2016   Procedure: ESOPHAGOGASTRODUODENOSCOPY (EGD) WITH PROPOFOL;  Surgeon: Midge Minium, MD;  Location: [facility] ENDOSCOPY;  Service: Endoscopy;  Laterality: N/A;    Family Psychiatric History: ***  Family History:  Family History  Problem Relation Age of Onset   Lung cancer Father    Cancer Father        lung cancer   Alcohol abuse Father    Depression Mother    Anxiety disorder Mother    Arthritis Mother    Hyperlipidemia Mother    Asthma Son    Lung cancer Paternal Aunt    Cancer Paternal Aunt        lung   Bone cancer Paternal Uncle    Cancer Paternal Uncle        pancreatic   Hypertension Maternal Grandmother    Diabetes  Paternal Grandmother    Heart disease Paternal Grandmother    Hypertension Paternal Grandmother    Vision loss Neg Hx    COPD Neg Hx     Social History:   Academic/Vocational:  Unemployed, on disability for medical reasons    Social History   Socioeconomic History   Marital status: Single    Spouse name: Not on file   Number of children: Not on file   Years of education: Not on file   Highest education level: Not on file  Occupational History   Not on file  Tobacco Use   Smoking status: Every Day    Current  packs/day: 1.00    Average packs/day: 1 pack/day for 17.0 years (17.0 ttl pk-yrs)    Types: Cigarettes   Smokeless tobacco: Never  Vaping Use   Vaping status: Never Used  Substance and Sexual Activity   Alcohol use: Yes    Comment: occasional   Drug use: Yes    Frequency: 7.0 times per week    Types: Marijuana   Sexual activity: Yes    Birth control/protection: None  Other Topics Concern   Not on file  Social History Narrative   ** Merged History Encounter **       Social Determinants of Health   Financial Resource Strain: Not on file  Food Insecurity: Not on file  Transportation Needs: Not on file  Physical Activity: Not on file  Stress: Not on file  Social Connections: Unknown (02/27/2022)   Received from [facility], [facility]   Social Network    Social Network: Not on file    Additional Social History: updated  Allergies:  No Known Allergies  Current Medications: Current Outpatient Medications  Medication Sig Dispense Refill   Buprenorphine HCl-Naloxone HCl (SUBOXONE) 12-3 MG FILM Place under the tongue.     buPROPion (WELLBUTRIN XL) 150 MG 24 hr tablet Take 150 mg by mouth daily.     ciclopirox (LOPROX) 0.77 % cream Apply topically 2 (two) times daily. 15 g 0   citalopram (CELEXA) 40 MG tablet Take 1 tablet (40 mg total) by mouth at bedtime. 30 tablet 1   clotrimazole-betamethasone (LOTRISONE) cream Apply 1 application topically 2 (two) times daily. 30 g 0   cyclobenzaprine (FLEXERIL) 5 MG tablet Take 1 tablet (5 mg total) by mouth 3 (three) times daily as needed for muscle spasms. 30 tablet 0   dicyclomine (BENTYL) 20 MG tablet Take 1 tablet (20 mg total) by mouth 2 (two) times daily. 20 tablet 0   famotidine (PEPCID) 20 MG tablet Take 1 tablet (20 mg total) by mouth 2 (two) times daily. 60 tablet 0   gabapentin (NEURONTIN) 300 MG capsule Take by mouth.     HYDROcodone-acetaminophen (NORCO/VICODIN) 5-325 MG tablet Take by mouth.     lisinopril  (PRINIVIL,ZESTRIL) 20 MG tablet Take 20 mg by mouth daily.     loperamide (IMODIUM) 2 MG capsule Take 1 capsule (2 mg total) by mouth 4 (four) times daily as needed for diarrhea or loose stools. 12 capsule 0   metoCLOPramide (REGLAN) 10 MG tablet Take 1 tablet (10 mg total) by mouth 4 (four) times daily -  before meals and at bedtime. 60 tablet 0   naproxen (NAPROSYN) 500 MG tablet Take 1 tablet (500 mg total) by mouth 2 (two) times daily with a meal. 10 tablet 00   omeprazole (PRILOSEC) 20 MG capsule Take 20 mg by mouth 2 (two) times daily before a meal.  ondansetron (ZOFRAN ODT) 4 MG disintegrating tablet Take 1 tablet (4 mg total) by mouth every 8 (eight) hours as needed. 20 tablet 0   ondansetron (ZOFRAN) 4 MG tablet Take 1 tablet (4 mg total) by mouth every 6 (six) hours. 12 tablet 0   pantoprazole (PROTONIX) 20 MG tablet Take 20 mg by mouth 2 (two) times daily.     predniSONE (DELTASONE) 50 MG tablet Take 1 tablet (50 mg total) by mouth daily with breakfast. 5 tablet 0   promethazine (PHENERGAN) 25 MG suppository Place 1 suppository (25 mg total) rectally every 6 (six) hours as needed for nausea. 12 suppository 1   QUEtiapine (SEROQUEL) 50 MG tablet Take 100 mg by mouth at bedtime.     sucralfate (CARAFATE) 1 g tablet Take 1 tablet (1 g total) by mouth 4 (four) times daily -  with meals and at bedtime. 30 tablet 1   tiZANidine (ZANAFLEX) 4 MG tablet Take 4 mg by mouth 2 (two) times daily as needed.     No current facility-administered medications for this visit.    ROS: Review of Systems  Objective:  Psychiatric Specialty Exam: There were no vitals taken for this visit.There is no height or weight on file to calculate BMI.  General Appearance: {Appearance:22683}  Eye Contact:  {BHH EYE CONTACT:22684}  Speech:  {Speech:22685}  Volume:  {Volume (PAA):22686}  Mood:  {BHH MOOD:22306}  Affect:  {Affect (PAA):22687}  Thought Content: {Thought Content:22690}   Suicidal Thoughts:   {ST/HT (PAA):22692}  Homicidal Thoughts:  {ST/HT (PAA):22692}  Thought Process:  {Thought Process (PAA):22688}  Orientation:  {BHH ORIENTATION (PAA):22689}    Memory:  Grossly intact ***  Judgment:  {Judgement (PAA):22694}  Insight:  {Insight (PAA):22695}  Concentration:  {Concentration:21399}  Recall:  not formally assessed ***  Fund of Knowledge: {BHH GOOD/FAIR/POOR:22877}  Language: {BHH GOOD/FAIR/POOR:22877}  Psychomotor Activity:  {Psychomotor (PAA):22696}  Akathisia:  {BHH YES OR NO:22294}  AIMS (if indicated): {Desc; done/not:10129}  Assets:  {Assets (PAA):22698}  ADL's:  {BHH FAO'Z:30865}  Cognition: {chl bhh cognition:304700322}  Sleep:  {BHH GOOD/FAIR/POOR:22877}   PE: General: well-appearing; no acute distress *** Pulm: no increased work of breathing on room air *** Strength & Muscle Tone: {desc; muscle tone:32375} Neuro: no focal neurological deficits observed *** Gait & Station: {PE GAIT ED HQIO:96295}  Metabolic Disorder Labs: Lab Results  Component Value Date   HGBA1C 5.6 09/18/2015   No results found for: "PROLACTIN" No results found for: "CHOL", "TRIG", "HDL", "CHOLHDL", "VLDL", "LDLCALC" Lab Results  Component Value Date   TSH 0.592 09/18/2015    Therapeutic Level Labs: No results found for: "LITHIUM" No results found for: "CBMZ" No results found for: "VALPROATE"  Screenings:  Flowsheet Row ED from 10/01/2023 in [facility] ED from 04/06/2023 in [facility] Emergency Department at [facility] ED from 03/28/2023 in [facility])  C-SSRS RISK CATEGORY No Risk No Risk No Risk       Collaboration of Care: Collaboration of Care: [facility] OP Collaboration of MWUX:[id number]}  Patient/Guardian was advised Release of Information must be obtained prior to any record release in order to collaborate their care with an outside provider. Patient/Guardian was advised if they have not already  done so to contact the registration department to sign all necessary forms in order for [place] to release information regarding their care.   Consent: Patient/Guardian gives verbal consent for treatment and assignment of benefits for services provided during this visit. Patient/Guardian  expressed understanding and agreed to proceed.   A total of *** minutes was spent involved in face to face clinical care, chart review, documentation, and ***.   [name], MD 12/3/20247:44 AM

## 2023-10-03 ENCOUNTER — Ambulatory Visit (INDEPENDENT_AMBULATORY_CARE_PROVIDER_SITE_OTHER): Payer: MEDICAID | Admitting: Psychiatry

## 2023-10-03 VITALS — BP 163/91 | HR 91 | Wt 197.0 lb

## 2023-10-03 DIAGNOSIS — I10 Essential (primary) hypertension: Secondary | ICD-10-CM | POA: Diagnosis not present

## 2023-10-03 DIAGNOSIS — Z79899 Other long term (current) drug therapy: Secondary | ICD-10-CM | POA: Diagnosis not present

## 2023-10-03 NOTE — Addendum Note (Signed)
Addended by: [name] on: 10/03/2023 07:40 PM   Modules accepted: Level of Service

## 2023-10-04 ENCOUNTER — Telehealth (HOSPITAL_COMMUNITY): Payer: Self-pay | Admitting: Psychiatry

## 2023-10-08 ENCOUNTER — Ambulatory Visit
Admission: RE | Admit: 2023-10-08 | Discharge: 2023-10-08 | Disposition: A | Discharge status: Home / Self Care | Payer: MEDICAID | Source: Ambulatory Visit | Attending: Physician Assistant | Admitting: Physician Assistant

## 2023-10-08 DIAGNOSIS — M47816 Spondylosis without myelopathy or radiculopathy, lumbar region: Secondary | ICD-10-CM

## 2024-01-08 ENCOUNTER — Ambulatory Visit (HOSPITAL_BASED_OUTPATIENT_CLINIC_OR_DEPARTMENT_OTHER): Payer: MEDICAID | Admitting: Family Medicine

## 2024-02-25 ENCOUNTER — Ambulatory Visit (HOSPITAL_BASED_OUTPATIENT_CLINIC_OR_DEPARTMENT_OTHER): Payer: MEDICAID | Admitting: Family Medicine

## 2024-04-25 ENCOUNTER — Encounter (HOSPITAL_COMMUNITY): Payer: Self-pay | Admitting: *Deleted

## 2024-04-25 ENCOUNTER — Ambulatory Visit (HOSPITAL_COMMUNITY)
Admission: EM | Admit: 2024-04-25 | Discharge: 2024-04-25 | Disposition: A | Discharge status: Home / Self Care | Payer: MEDICAID

## 2024-04-25 DIAGNOSIS — K0889 Other specified disorders of teeth and supporting structures: Secondary | ICD-10-CM

## 2024-04-25 DIAGNOSIS — S025XXA Fracture of tooth (traumatic), initial encounter for closed fracture: Secondary | ICD-10-CM

## 2024-04-25 MED ORDER — AMOXICILLIN 875 MG PO TABS: 875.0000 mg | ORAL_TABLET | ORAL | 0 refills | Status: AC

## 2024-04-25 MED ORDER — NAPROXEN 500 MG PO TABS: 500.0000 mg | ORAL_TABLET | Freq: Two times a day (BID) | ORAL | 0 refills | Status: DC

## 2024-04-25 MED ORDER — CHLORHEXIDINE GLUCONATE 0.12 % MT SOLN: 15.0000 mL | OROMUCOSAL | 0 refills | Status: AC

## 2024-04-25 NOTE — ED Triage Notes (Signed)
 Pt states she has left lower dental pain which she has seen her pcp for and has been on antibiotics. She states that she has been having dental pain X months.

## 2024-04-25 NOTE — Discharge Instructions (Addendum)
 You were seen today for a dental infection in a partially erupted and decayed tooth. You recently completed antibiotics from your primary care provider, which gave temporary relief, but your symptoms have returned with severe pain. There is no visible abscess or facial swelling at this time, and you do not have a fever. You were prescribed antibiotics to treat the infection and pain medication to help manage discomfort. While taking these medications, do not use over-the-counter anti-inflammatories such as aspirin, Motrin , ibuprofen , or Aleve , as this may increase the risk of side effects. If needed, you may take Tylenol  (acetaminophen ) 1000 mg every six hours for additional pain relief. This equals two 500 mg tablets at a time. Be careful not to take more than 4000 mg of Tylenol  in a 24-hour period. An antiseptic mouth rinse was also prescribed to use twice daily. Be sure not to eat, drink, or smoke for at least 30 minutes after using the rinse to allow it to work effectively.   For symptom relief, continue to use the prescribed pain medication as directed. You may also apply a cold compress to the outside of your cheek to help with swelling and discomfort. Avoid chewing on the affected side, and stick to soft foods until the pain improves. Follow up with your oral surgeon as scheduled on Monday for definitive treatment. If you develop increased facial swelling, fever, difficulty opening your mouth or swallowing, or if the pain becomes unmanageable despite medication, go to the emergency department or seek urgent dental care.

## 2024-04-25 NOTE — ED Provider Notes (Signed)
 [facility]    CSN: [id number] Arrival date & time: 04/25/24  1151      History   Chief Complaint Chief Complaint  Patient presents with   Dental Pain    HPI Laurie Frederick is a 48 y.o. female.   Discussed the use of AI scribe software for clinical note transcription with the patient, who gave verbal consent to proceed.   Laurie Frederick is a 48 y.o. female with a chief complaint of severe tooth pain due to an infected partially erupted wisdom tooth. The patient reports a history of wisdom tooth issues, with one removed about 6 years ago at a dental school in [place]. The patient states that the current problematic tooth partially erupted and then stopped, developing a hole that keeps getting infected. The infection has now spread to the nerve, causing intense pain and chills. The patient expresses concern that the infection may have entered their bloodstream. They completed a course of antibiotics prescribed by their primary care physician about 2 weeks ago, which temporarily alleviated the pain, but the infection has since returned. The patient is particularly distressed because the pain is so severe that they feel unable to make it through the weekend until their oral surgeon appointment on Monday.  The following portions of the patient's history were reviewed and updated as appropriate: allergies, current medications, past family history, past medical history, past social history, past surgical history, and problem list.    Past Medical History:  Diagnosis Date   Anemia    Anxiety    Depression    GERD (gastroesophageal reflux disease)    H pylori ulcer    Hyperlipidemia    Multiple gastric ulcers    Nontoxic uninodular goiter    Obesity    PTSD (post-traumatic stress disorder)    Sleep apnea    Vitamin D deficiency     Patient Active Problem List   Diagnosis Date Noted   Enlarged uterus 02/08/2018   Overdose 04/11/2016   Abdominal pain     Anxiety    Uncontrollable vomiting    Lower urinary tract infectious disease 03/13/2016   Abdominal pain, epigastric    Gastritis    Hypokalemia 08/16/2015   Chronic prescription benzodiazepine use 08/16/2015   Intractable nausea and vomiting 07/22/2015   Epigastric pain    Helicobacter positive gastritis 07/16/2015   Gastritis and gastroduodenitis 07/16/2015   Hyperlipidemia 07/13/2015   Vitamin D deficiency 07/13/2015   Thyroid  nodule 07/13/2015   Polyp of gallbladder 07/13/2015   Liver nodule 07/13/2015   Nausea & vomiting 07/13/2015   Gastroenteritis 07/06/2015   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   Alcohol abuse 04/01/2015    Past Surgical History:  Procedure Laterality Date   CESAREAN SECTION     CESAREAN SECTION     DILATION AND CURETTAGE OF UTERUS     x 2 TAB   ESOPHAGOGASTRODUODENOSCOPY (EGD) WITH PROPOFOL  Left 07/07/2015   Procedure: ESOPHAGOGASTRODUODENOSCOPY (EGD) WITH PROPOFOL ;  Surgeon: Lamar ONEIDA Holmes, MD;  Location: [facility];  Service: Endoscopy;  Laterality: Left;   ESOPHAGOGASTRODUODENOSCOPY (EGD) WITH PROPOFOL  N/A 09/05/2015   Procedure: ESOPHAGOGASTRODUODENOSCOPY (EGD) WITH PROPOFOL ;  Surgeon: Rogelia Copping, MD;  Location: [facility] ENDOSCOPY;  Service: Endoscopy;  Laterality: N/A;   ESOPHAGOGASTRODUODENOSCOPY (EGD) WITH PROPOFOL  N/A 03/15/2016   Procedure: ESOPHAGOGASTRODUODENOSCOPY (EGD) WITH PROPOFOL ;  Surgeon: Rogelia Copping, MD;  Location: [facility] ENDOSCOPY;  Service: Endoscopy;  Laterality: N/A;    OB History     Gravida  2  Para  2   Term  2   Preterm  0   AB  0   Living  2      SAB  0   IAB  0   Ectopic  0   Multiple  0   Live Births               Home Medications    Prior to Admission medications   Medication Sig Start Date End Date Taking? Authorizing Provider  ALPRAZolam  (XANAX ) 0.5 MG tablet Take 0.5 mg by mouth 2 (two) times daily as needed. 04/20/24  Yes [provider]  amoxicillin  (AMOXIL ) 875 MG  tablet Take 1 tablet (875 mg total) by mouth 2 (two) times daily at 8 am and 6 pm for 7 days. 04/25/24 05/02/24 Yes Iola Lukes, FNP  buprenorphine (SUBUTEX) 8 MG SUBL SL tablet Place 24 mg under the tongue daily. 04/10/24  Yes [provider]  chlorhexidine (PERIDEX) 0.12 % solution Use as directed 15 mLs in the mouth or throat 2 (two) times daily at 8 am and 6 pm for 7 days. Brush teeth then swish in mouth for 2 minutes then spit out. Do not rinse mouth after use. Avoid eating, drinking, smoking and vaping for at least 30 minutes after use 04/25/24 05/02/24 Yes Tyson Parkison, FNP  escitalopram (LEXAPRO) 20 MG tablet Take 20 mg by mouth daily. 03/29/24  Yes [provider]  gabapentin (NEURONTIN) 300 MG capsule Take by mouth.   Yes [provider]  lisinopril (PRINIVIL,ZESTRIL) 20 MG tablet Take 20 mg by mouth daily.   Yes [provider]  naproxen  (NAPROSYN ) 500 MG tablet Take 1 tablet (500 mg total) by mouth 2 (two) times daily with a meal. 04/25/24  Yes Iola Lukes, FNP  PREMARIN vaginal cream SMARTSIG:0.5 Gram(s) Vaginal 3 Times a Week 03/18/24  Yes [provider]  REXULTI 0.25 MG TABS tablet Take 0.25 mg by mouth daily. 12/26/23  Yes [provider]  tiZANidine (ZANAFLEX) 4 MG tablet Take 4 mg by mouth 2 (two) times daily as needed.   Yes [provider]  Buprenorphine HCl-Naloxone HCl (SUBOXONE) 12-3 MG FILM Place under the tongue.    [provider]  buPROPion (WELLBUTRIN XL) 150 MG 24 hr tablet Take 150 mg by mouth daily.    [provider]  ciclopirox  (LOPROX ) 0.77 % cream Apply topically 2 (two) times daily. 08/17/19   Woods, Jaclyn M, PA-C  clotrimazole -betamethasone  (LOTRISONE ) cream Apply 1 application topically 2 (two) times daily. 02/08/18   Arloa Lamar SQUIBB, MD  cyclobenzaprine  (FLEXERIL ) 5 MG tablet Take 1 tablet (5 mg total) by mouth 3 (three) times daily as needed for muscle spasms. 03/28/23   Christopher Savannah, PA-C  dicyclomine  (BENTYL ) 20 MG tablet Take 1 tablet (20 mg total) by mouth 2 (two) times daily. 04/06/23   Kingsley, Victoria K, DO  famotidine  (PEPCID ) 20 MG tablet Take 1 tablet (20 mg total) by mouth 2 (two) times daily. 06/13/16   Viviann Pastor, MD  HYDROcodone -acetaminophen  (NORCO/VICODIN) 5-325 MG tablet Take by mouth. 07/12/15   [provider]  loperamide  (IMODIUM ) 2 MG capsule Take 1 capsule (2 mg total) by mouth 4 (four) times daily as needed for diarrhea or loose stools. 04/06/23   Kingsley, Victoria K, DO  metoCLOPramide  (REGLAN ) 10 MG tablet Take 1 tablet (10 mg total) by mouth 4 (four) times daily -  before meals and at bedtime. 06/13/16   Viviann Pastor, MD  omeprazole  (  PRILOSEC) 20 MG capsule Take 20 mg by mouth 2 (two) times daily before a meal.    [provider]  ondansetron  (ZOFRAN  ODT) 4 MG disintegrating tablet Take 1 tablet (4 mg total) by mouth every 8 (eight) hours as needed. 05/11/20   Arlander Charleston, MD  ondansetron  (ZOFRAN ) 4 MG tablet Take 1 tablet (4 mg total) by mouth every 6 (six) hours. 04/06/23   Kingsley, Victoria K, DO  pantoprazole  (PROTONIX ) 20 MG tablet Take 20 mg by mouth 2 (two) times daily.    [provider]  predniSONE  (DELTASONE ) 50 MG tablet Take 1 tablet (50 mg total) by mouth daily with breakfast. 03/28/23   Christopher Savannah, PA-C  promethazine  (PHENERGAN ) 25 MG suppository Place 1 suppository (25 mg total) rectally every 6 (six) hours as needed for nausea. 05/05/16 05/05/17  Gordan Huxley, MD  QUEtiapine  (SEROQUEL ) 50 MG tablet Take 100 mg by mouth at bedtime.    [provider]  sucralfate  (CARAFATE ) 1 g tablet Take 1 tablet (1 g total) by mouth 4 (four) times daily -  with meals and at bedtime. 04/08/16   Arlander Charleston, MD    Family History Family History  Problem Relation Age of Onset   Lung cancer Father    Cancer Father        lung cancer   Alcohol abuse Father    Depression Mother    Anxiety disorder Mother     Arthritis Mother    Hyperlipidemia Mother    Asthma Son    Lung cancer Paternal Aunt    Cancer Paternal Aunt        lung   Bone cancer Paternal Uncle    Cancer Paternal Uncle        pancreatic   Hypertension Maternal Grandmother    Diabetes Paternal Grandmother    Heart disease Paternal Grandmother    Hypertension Paternal Grandmother    Vision loss Neg Hx    COPD Neg Hx     Social History Social History   Tobacco Use   Smoking status: Every Day    Current packs/day: 1.00    Average packs/day: 1 pack/day for 17.0 years (17.0 ttl pk-yrs)    Types: Cigarettes   Smokeless tobacco: Never  Vaping Use   Vaping status: Never Used  Substance Use Topics   Alcohol use: Yes    Comment: occasional   Drug use: Yes    Frequency: 7.0 times per week    Types: Marijuana     Allergies   Patient has no known allergies.   Review of Systems Review of Systems  Constitutional:  Positive for chills.  HENT:  Positive for dental problem.   All other systems reviewed and are negative.    Physical Exam Triage Vital Signs ED Triage Vitals  Encounter Vitals Group     BP 04/25/24 1211 (!) 157/97     Girls Systolic BP Percentile --      Girls Diastolic BP Percentile --      Boys Systolic BP Percentile --      Boys Diastolic BP Percentile --      Pulse Rate 04/25/24 1211 86     Resp 04/25/24 1211 18     Temp 04/25/24 1211 98.9 F (37.2 C)     Temp Source 04/25/24 1211 Oral     SpO2 04/25/24 1211 97 %     Weight --      Height --      Head Circumference --  Peak Flow --      Pain Score 04/25/24 1204 10     Pain Loc --      Pain Education --      Exclude from Growth Chart --    No data found.  Updated Vital Signs BP (!) 157/97 (BP Location: Right Arm)   Pulse 86   Temp 98.9 F (37.2 C) (Oral)   Resp 18   SpO2 97%   Visual Acuity Right Eye Distance:   Left Eye Distance:   Bilateral Distance:    Right Eye Near:   Left Eye Near:    Bilateral Near:      Physical Exam Vitals reviewed.  Constitutional:      General: She is awake. She is not in acute distress.    Appearance: Normal appearance. She is well-developed. She is not ill-appearing, toxic-appearing or diaphoretic.  HENT:     Head: Normocephalic.     Mouth/Throat:     Mouth: Mucous membranes are moist.     Pharynx: Oropharynx is clear. Uvula midline.    Eyes:     Conjunctiva/sclera: Conjunctivae normal.    Cardiovascular:     Rate and Rhythm: Normal rate and regular rhythm.     Heart sounds: Normal heart sounds.  Pulmonary:     Effort: Pulmonary effort is normal.     Breath sounds: Normal breath sounds.   Musculoskeletal:        General: Normal range of motion.   Skin:    General: Skin is warm and dry.   Neurological:     General: No focal deficit present.     Mental Status: She is alert and oriented to person, place, and time.   Psychiatric:        Behavior: Behavior is cooperative.      UC Treatments / Results  Labs (all labs ordered are listed, but only abnormal results are displayed) Labs Reviewed - No data to display  EKG   Radiology No results found.  Procedures Procedures (including critical care time)  Medications Ordered in UC Medications - No data to display  Initial Impression / Assessment and Plan / UC Course  I have reviewed the triage vital signs and the nursing notes.  Pertinent labs & imaging results that were available during my care of the patient were reviewed by me and considered in my medical decision making (see chart for details).     Patient presents with recurrent dental infection in a partially erupted, decayed tooth--likely a wisdom tooth--with a visible cavity and ongoing severe pain. The patient recently completed a course of antibiotics from their primary care provider, which provided only temporary relief. The current episode involves severe nerve-like pain without fever or visible abscess. On exam, decay is noted  but no overt signs of swelling or pus. Given the recurrence and severity of symptoms, likely secondary to chronic infection. Antibiotics were prescribed to address the infection, along with pain medication for symptomatic relief. An antiseptic mouth rinse was also prescribed to be used twice daily, with instructions to avoid eating, drinking, or smoking for at least 30 minutes after use. Patient was advised to keep their scheduled appointment with the oral surgeon on Monday for definitive treatment. Advised to seek urgent care or ED evaluation if facial swelling, fever, difficulty swallowing, or worsening pain develops.  Today's evaluation has revealed no signs of a dangerous process. Discussed diagnosis with patient and/or guardian. Patient and/or guardian aware of their diagnosis, possible red flag symptoms to  watch out for and need for close follow up. Patient and/or guardian understands verbal and written discharge instructions. Patient and/or guardian comfortable with plan and disposition.  Patient and/or guardian has a clear mental status at this time, good insight into illness (after discussion and teaching) and has clear judgment to make decisions regarding their care  Documentation was completed with the aid of voice recognition software. Transcription may contain typographical errors. Final Clinical Impressions(s) / UC Diagnoses   Final diagnoses:  Pain, dental  Closed fracture of tooth, initial encounter  Dentalgia     Discharge Instructions      You were seen today for a dental infection in a partially erupted and decayed tooth. You recently completed antibiotics from your primary care provider, which gave temporary relief, but your symptoms have returned with severe pain. There is no visible abscess or facial swelling at this time, and you do not have a fever. You were prescribed antibiotics to treat the infection and pain medication to help manage discomfort. While taking these  medications, do not use over-the-counter anti-inflammatories such as aspirin, Motrin , ibuprofen , or Aleve , as this may increase the risk of side effects. If needed, you may take Tylenol  (acetaminophen ) 1000 mg every six hours for additional pain relief. This equals two 500 mg tablets at a time. Be careful not to take more than 4000 mg of Tylenol  in a 24-hour period. An antiseptic mouth rinse was also prescribed to use twice daily. Be sure not to eat, drink, or smoke for at least 30 minutes after using the rinse to allow it to work effectively.   For symptom relief, continue to use the prescribed pain medication as directed. You may also apply a cold compress to the outside of your cheek to help with swelling and discomfort. Avoid chewing on the affected side, and stick to soft foods until the pain improves. Follow up with your oral surgeon as scheduled on Monday for definitive treatment. If you develop increased facial swelling, fever, difficulty opening your mouth or swallowing, or if the pain becomes unmanageable despite medication, go to the emergency department or seek urgent dental care.      ED Prescriptions     Medication Sig Dispense Auth. Provider   chlorhexidine (PERIDEX) 0.12 % solution Use as directed 15 mLs in the mouth or throat 2 (two) times daily at 8 am and 6 pm for 7 days. Brush teeth then swish in mouth for 2 minutes then spit out. Do not rinse mouth after use. Avoid eating, drinking, smoking and vaping for at least 30 minutes after use 210 mL Venice Marcucci, FNP   amoxicillin  (AMOXIL ) 875 MG tablet Take 1 tablet (875 mg total) by mouth 2 (two) times daily at 8 am and 6 pm for 7 days. 14 tablet Merrillyn Ackerley, [place], FNP   naproxen  (NAPROSYN ) 500 MG tablet Take 1 tablet (500 mg total) by mouth 2 (two) times daily with a meal. 20 tablet Iola Lukes, FNP      I have reviewed the PDMP during this encounter.   Cybill, [place], [place] 04/25/24 1248

## 2024-07-05 ENCOUNTER — Ambulatory Visit (HOSPITAL_COMMUNITY): Payer: MEDICAID

## 2024-07-08 ENCOUNTER — Encounter (HOSPITAL_COMMUNITY): Payer: Self-pay

## 2024-07-08 ENCOUNTER — Ambulatory Visit (HOSPITAL_COMMUNITY)
Admission: EM | Admit: 2024-07-08 | Discharge: 2024-07-08 | Disposition: A | Discharge status: Home / Self Care | Payer: MEDICAID | Attending: Family Medicine | Admitting: Family Medicine

## 2024-07-08 DIAGNOSIS — N3090 Cystitis, unspecified without hematuria: Secondary | ICD-10-CM | POA: Diagnosis not present

## 2024-07-08 DIAGNOSIS — Z202 Contact with and (suspected) exposure to infections with a predominantly sexual mode of transmission: Secondary | ICD-10-CM | POA: Insufficient documentation

## 2024-07-08 DIAGNOSIS — F419 Anxiety disorder, unspecified: Secondary | ICD-10-CM | POA: Diagnosis not present

## 2024-07-08 LAB — POCT URINALYSIS DIP (MANUAL ENTRY)
Bilirubin, UA: NEGATIVE
Glucose, UA: NEGATIVE mg/dL
Ketones, POC UA: NEGATIVE mg/dL
Leukocytes, UA: NEGATIVE
Nitrite, UA: NEGATIVE
Protein Ur, POC: NEGATIVE mg/dL
Spec Grav, UA: 1.015 (ref 1.010–1.025)
Urobilinogen, UA: 0.2 U/dL
pH, UA: 6.5 (ref 5.0–8.0)

## 2024-07-08 MED ORDER — NITROFURANTOIN MONOHYD MACRO 100 MG PO CAPS: 100.0000 mg | ORAL_CAPSULE | Freq: Two times a day (BID) | ORAL | 0 refills | Status: AC

## 2024-07-08 MED ORDER — PHENAZOPYRIDINE HCL 100 MG PO TABS: 100.0000 mg | ORAL_TABLET | Freq: Three times a day (TID) | ORAL | 0 refills | Status: AC | PRN

## 2024-07-08 MED ORDER — ESCITALOPRAM OXALATE 20 MG PO TABS: 20.0000 mg | ORAL_TABLET | Freq: Every day | ORAL | 1 refills | Status: DC

## 2024-07-08 NOTE — ED Provider Notes (Signed)
 [facility]    CSN: [id number] Arrival date & time: 07/08/24  1311      History   Chief Complaint Chief Complaint  Patient presents with   Urinary Tract Infection    HPI Laurie Frederick is a 48 y.o. female.    Urinary Tract Infection Here for dysuria and urinary frequency.  She has had some chills and maybe some nausea.  No vomiting  NKDA  She also requests STD testing  She also requests new prescriptions of her Xanax  and Lexapro .  I have reviewed the PMP and she has been prescribed the Xanax  previously and also Subutex.  Her last prescription was filled of the Xanax  in late July for a month supply.  She takes the Xanax  and Lexapro  for anxiety.  No suicidal thoughts.  Past Medical History:  Diagnosis Date   Anemia    Anxiety    Depression    GERD (gastroesophageal reflux disease)    H pylori ulcer    Hyperlipidemia    Multiple gastric ulcers    Nontoxic uninodular goiter    Obesity    PTSD (post-traumatic stress disorder)    Sleep apnea    Vitamin D deficiency     Patient Active Problem List   Diagnosis Date Noted   Enlarged uterus 02/08/2018   Overdose 04/11/2016   Abdominal pain    Anxiety    Uncontrollable vomiting    Lower urinary tract infectious disease 03/13/2016   Abdominal pain, epigastric    Gastritis    Hypokalemia 08/16/2015   Chronic prescription benzodiazepine use 08/16/2015   Intractable nausea and vomiting 07/22/2015   Epigastric pain    Helicobacter positive gastritis 07/16/2015   Gastritis and gastroduodenitis 07/16/2015   Hyperlipidemia 07/13/2015   Vitamin D deficiency 07/13/2015   Thyroid  nodule 07/13/2015   Polyp of gallbladder 07/13/2015   Liver nodule 07/13/2015   Nausea & vomiting 07/13/2015   Gastroenteritis 07/06/2015   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   Alcohol abuse 04/01/2015    Past Surgical History:  Procedure Laterality Date   CESAREAN SECTION     CESAREAN SECTION     DILATION AND  CURETTAGE OF UTERUS     x 2 TAB   ESOPHAGOGASTRODUODENOSCOPY (EGD) WITH PROPOFOL  Left 07/07/2015   Procedure: ESOPHAGOGASTRODUODENOSCOPY (EGD) WITH PROPOFOL ;  Surgeon: Lamar ONEIDA Holmes, MD;  Location: [facility] ENDOSCOPY;  Service: Endoscopy;  Laterality: Left;   ESOPHAGOGASTRODUODENOSCOPY (EGD) WITH PROPOFOL  N/A 09/05/2015   Procedure: ESOPHAGOGASTRODUODENOSCOPY (EGD) WITH PROPOFOL ;  Surgeon: Rogelia Copping, MD;  Location: [facility] ENDOSCOPY;  Service: Endoscopy;  Laterality: N/A;   ESOPHAGOGASTRODUODENOSCOPY (EGD) WITH PROPOFOL  N/A 03/15/2016   Procedure: ESOPHAGOGASTRODUODENOSCOPY (EGD) WITH PROPOFOL ;  Surgeon: Rogelia Copping, MD;  Location: [facility] ENDOSCOPY;  Service: Endoscopy;  Laterality: N/A;    OB History     Gravida  2   Para  2   Term  2   Preterm  0   AB  0   Living  2      SAB  0   IAB  0   Ectopic  0   Multiple  0   Live Births               Home Medications    Prior to Admission medications   Medication Sig Start Date End Date Taking? Authorizing Provider  nitrofurantoin , macrocrystal-monohydrate, (MACROBID ) 100 MG capsule Take 1 capsule (100 mg total) by mouth 2 (two) times daily. 07/08/24  Yes Vonna Sharlet POUR, MD  phenazopyridine  (PYRIDIUM ) 100  MG tablet Take 1 tablet (100 mg total) by mouth 3 (three) times daily as needed (urinary pain). 07/08/24  Yes Vonna Sharlet POUR, MD  ALPRAZolam  (XANAX ) 0.5 MG tablet Take 0.5 mg by mouth 2 (two) times daily as needed. 04/20/24   [provider]  buprenorphine (SUBUTEX) 8 MG SUBL SL tablet Place 24 mg under the tongue daily. 04/10/24   [provider]  escitalopram  (LEXAPRO ) 20 MG tablet Take 1 tablet (20 mg total) by mouth daily. 07/08/24   Shresta Risden K, MD  gabapentin (NEURONTIN) 300 MG capsule Take by mouth.    [provider]  lisinopril (PRINIVIL,ZESTRIL) 20 MG tablet Take 20 mg by mouth daily.    [provider]  ondansetron  (ZOFRAN  ODT) 4 MG disintegrating tablet Take 1 tablet (4 mg  total) by mouth every 8 (eight) hours as needed. 05/11/20   Arlander Charleston, MD  PREMARIN vaginal cream SMARTSIG:0.5 Gram(s) Vaginal 3 Times a Week 03/18/24   [provider]  tiZANidine (ZANAFLEX) 4 MG tablet Take 4 mg by mouth 2 (two) times daily as needed.    [provider]    Family History Family History  Problem Relation Age of Onset   Lung cancer Father    Cancer Father        lung cancer   Alcohol abuse Father    Depression Mother    Anxiety disorder Mother    Arthritis Mother    Hyperlipidemia Mother    Asthma Son    Lung cancer Paternal Aunt    Cancer Paternal Aunt        lung   Bone cancer Paternal Uncle    Cancer Paternal Uncle        pancreatic   Hypertension Maternal Grandmother    Diabetes Paternal Grandmother    Heart disease Paternal Grandmother    Hypertension Paternal Grandmother    Vision loss Neg Hx    COPD Neg Hx     Social History Social History   Tobacco Use   Smoking status: Every Day    Current packs/day: 1.00    Average packs/day: 1 pack/day for 17.0 years (17.0 ttl pk-yrs)    Types: Cigarettes   Smokeless tobacco: Never  Vaping Use   Vaping status: Never Used  Substance Use Topics   Alcohol use: Yes    Comment: occasional   Drug use: Yes    Frequency: 7.0 times per week    Types: Marijuana     Allergies   Patient has no known allergies.   Review of Systems Review of Systems   Physical Exam Triage Vital Signs ED Triage Vitals [07/08/24 1440]  Encounter Vitals Group     BP 128/83     Girls Systolic BP Percentile      Girls Diastolic BP Percentile      Boys Systolic BP Percentile      Boys Diastolic BP Percentile      Pulse Rate 84     Resp 18     Temp 98.6 F (37 C)     Temp Source Oral     SpO2 95 %     Weight      Height      Head Circumference      Peak Flow      Pain Score 8     Pain Loc      Pain Education      Exclude from Growth Chart    No data found.  Updated Vital  Signs BP 128/83  (BP Location: Left Arm)   Pulse 84   Temp 98.6 F (37 C) (Oral)   Resp 18   SpO2 95%   Visual Acuity Right Eye Distance:   Left Eye Distance:   Bilateral Distance:    Right Eye Near:   Left Eye Near:    Bilateral Near:     Physical Exam Vitals reviewed.  Constitutional:      General: She is not in acute distress.    Appearance: She is not toxic-appearing.  HENT:     Nose: Nose normal.     Mouth/Throat:     Mouth: Mucous membranes are moist.     Pharynx: No oropharyngeal exudate or posterior oropharyngeal erythema.  Eyes:     Extraocular Movements: Extraocular movements intact.     Conjunctiva/sclera: Conjunctivae normal.     Pupils: Pupils are equal, round, and reactive to light.  Cardiovascular:     Rate and Rhythm: Normal rate and regular rhythm.     Heart sounds: No murmur heard. Pulmonary:     Effort: No respiratory distress.     Breath sounds: No stridor. No wheezing, rhonchi or rales.  Musculoskeletal:     Cervical back: Neck supple.  Lymphadenopathy:     Cervical: No cervical adenopathy.  Skin:    Coloration: Skin is not jaundiced or pale.  Neurological:     General: No focal deficit present.     Mental Status: She is alert and oriented to person, place, and time.  Psychiatric:        Behavior: Behavior normal.      UC Treatments / Results  Labs (all labs ordered are listed, but only abnormal results are displayed) Labs Reviewed  POCT URINALYSIS DIP (MANUAL ENTRY) - Abnormal; Notable for the following components:      Result Value   Clarity, UA cloudy (*)    Blood, UA moderate (*)    All other components within normal limits  URINE CULTURE  CERVICOVAGINAL ANCILLARY ONLY    EKG   Radiology No results found.  Procedures Procedures (including critical care time)  Medications Ordered in UC Medications - No data to display  Initial Impression / Assessment and Plan / UC Course  I have reviewed the triage vital signs and the nursing  notes.  Pertinent labs & imaging results that were available during my care of the patient were reviewed by me and considered in my medical decision making (see chart for details).     Urinalysis shows some red blood cells.  Macrobid  is sent in along with Pyridium  for cystitis.  Urine culture sent and we will notify her and treat per protocol if she needs a different antibiotic.  Vaginal self swab is done, and we will notify of any positives on that and treat per protocol.  I have discussed with her that we do not as a matter of policy prescribe controlled substances for chronic issues from her urgent care clinics.  We discussed options for treatment and she may consider going back to her primary care in [place] where she used to live in the short-term.   Final Clinical Impressions(s) / UC Diagnoses   Final diagnoses:  Cystitis  Potential exposure to STD  Anxiety     Discharge Instructions      Urinalysis had some blood on it, and this could be consistent with a bladder infection.  Take nitrofurantoin  100 mg--1 capsule 2 times daily for 5 days  Take Pyridium /phenazopyridine  100 mg--1  tablet 3 times daily as needed for urinary pain.  This medication usually makes the urine orange  Urine culture is sent, and staff will notify you that it looks like the antibiotic needs to be changed.  Staff will notify you if there is anything positive on the swab. It can take 2-3 days for the tests to result, depending on the day of the week your test was taken. You will only be notified if there are any positives on the testing; test results will also go to your MyChart if you are signed up for MyChart.   A refill is sent of your Lexapro /escitalopram .  Please follow-up with primary care.     ED Prescriptions     Medication Sig Dispense Auth. Provider   escitalopram  (LEXAPRO ) 20 MG tablet Take 1 tablet (20 mg total) by mouth daily. 30 tablet Bryttany Tortorelli, Sharlet POUR, MD   nitrofurantoin ,  macrocrystal-monohydrate, (MACROBID ) 100 MG capsule Take 1 capsule (100 mg total) by mouth 2 (two) times daily. 10 capsule Vonna Sharlet POUR, MD   phenazopyridine  (PYRIDIUM ) 100 MG tablet Take 1 tablet (100 mg total) by mouth 3 (three) times daily as needed (urinary pain). 10 tablet Vonna Sharlet POUR, MD      I have reviewed the PDMP during this encounter.   Vonna Sharlet POUR, MD 07/08/24 [phone]

## 2024-07-08 NOTE — ED Triage Notes (Signed)
 Pt c/o burning on urination with urgency and frequency x3-4days. States lost her PCP and needs refill on her xanax  and lexapro .

## 2024-07-08 NOTE — Discharge Instructions (Addendum)
 Urinalysis had some blood on it, and this could be consistent with a bladder infection.  Take nitrofurantoin  100 mg--1 capsule 2 times daily for 5 days  Take Pyridium /phenazopyridine  100 mg--1 tablet 3 times daily as needed for urinary pain.  This medication usually makes the urine orange  Urine culture is sent, and staff will notify you that it looks like the antibiotic needs to be changed.  Staff will notify you if there is anything positive on the swab. It can take 2-3 days for the tests to result, depending on the day of the week your test was taken. You will only be notified if there are any positives on the testing; test results will also go to your MyChart if you are signed up for MyChart.   A refill is sent of your Lexapro /escitalopram .  Please follow-up with primary care.

## 2024-07-09 LAB — CERVICOVAGINAL ANCILLARY ONLY
Chlamydia: NEGATIVE
Comment: NEGATIVE
Comment: NEGATIVE
Comment: NORMAL
Neisseria Gonorrhea: NEGATIVE
Trichomonas: NEGATIVE

## 2024-07-10 ENCOUNTER — Ambulatory Visit (HOSPITAL_COMMUNITY): Payer: Self-pay

## 2024-07-10 LAB — URINE CULTURE: Culture: 40000 — AB

## 2024-08-04 ENCOUNTER — Ambulatory Visit (HOSPITAL_COMMUNITY)
Admission: EM | Admit: 2024-08-04 | Discharge: 2024-08-04 | Disposition: A | Discharge status: Home / Self Care | Payer: MEDICAID

## 2024-08-04 NOTE — ED Notes (Signed)
 Patient presents to the office for refill on her Lexapro . Patient was not aware she had a refill waiting at the pharmacy. I called [facility] and verified she has one more refill available. Patient will go and pick up refill.

## 2024-09-01 DIAGNOSIS — Z122 Encounter for screening for malignant neoplasm of respiratory organs: Secondary | ICD-10-CM

## 2024-09-01 DIAGNOSIS — F17200 Nicotine dependence, unspecified, uncomplicated: Secondary | ICD-10-CM

## 2024-11-19 ENCOUNTER — Ambulatory Visit: Admission: EM | Admit: 2024-11-19 | Discharge: 2024-11-19 | Disposition: A | Discharge status: Home / Self Care

## 2024-11-19 ENCOUNTER — Encounter: Payer: Self-pay | Admitting: Emergency Medicine

## 2024-11-19 DIAGNOSIS — Z760 Encounter for issue of repeat prescription: Secondary | ICD-10-CM

## 2024-11-19 MED ORDER — ESCITALOPRAM OXALATE 20 MG PO TABS: 20.0000 mg | ORAL_TABLET | Freq: Every day | ORAL | 1 refills | Status: AC

## 2024-11-19 MED ORDER — ALPRAZOLAM 0.5 MG PO TABS: 0.5000 mg | ORAL_TABLET | Freq: Two times a day (BID) | ORAL | 0 refills | Status: AC | PRN

## 2024-11-19 MED ORDER — BUPRENORPHINE HCL 8 MG SL SUBL: 24.0000 mg | SUBLINGUAL_TABLET | Freq: Every day | SUBLINGUAL | 0 refills | Status: DC

## 2024-11-19 NOTE — Discharge Instructions (Signed)
" °  1. Medication refill (Primary) - ALPRAZolam  (XANAX ) 0.5 MG tablet; Take 1 tablet (0.5 mg total) by mouth 2 (two) times daily as needed.  Dispense: 16 tablet; Refill: 0 - escitalopram  (LEXAPRO ) 20 MG tablet; Take 1 tablet (20 mg total) by mouth daily.  Dispense: 30 tablet; Refill: 1 - buprenorphine  (SUBUTEX ) 8 MG SUBL SL tablet; Place 3 tablets (24 mg total) under the tongue daily.  Dispense: 21 tablet; Refill: 0  -Continue to monitor symptoms for any change in severity if there is any escalation of current symptoms or development of new symptoms follow-up in ER for further evaluation, management or for further refills. "

## 2024-11-19 NOTE — ED Triage Notes (Addendum)
 Patient presents for medication refill of   subutex , xanax , and lexapro .  Patient had to leave her primary care because they did not take her insurance so now she is without medication.  Patient is in need of a Primary Care MD.

## 2024-11-19 NOTE — ED Provider Notes (Signed)
 " [facility]  Note:  This document was prepared using Dragon voice recognition software and may include unintentional dictation errors.  MRN: [id number] DOB: 10/28/1976  Subjective:   Laurie Frederick is a 49 y.o. female presenting for medication refill.  Patient reports that she is currently in between primary care providers because her provider at [facility] no longer accepts her insurance.  Patient is here for refill of Lexapro , Xanax , Subutex  that she has been on for several years.  Patient is also inquiring about a new primary care provider at [facility].  Patient also reports that she has an appointment with her pain management specialist on February 9 history and was hoping for prescription to bridge the time between now and her appointment.  Patient denies any other medical concerns at this time.  Current Medications[1]   Allergies[2]  Past Medical History:  Diagnosis Date   Anemia    Anxiety    Depression    GERD (gastroesophageal reflux disease)    H pylori ulcer    Hyperlipidemia    Multiple gastric ulcers    Nontoxic uninodular goiter    Obesity    PTSD (post-traumatic stress disorder)    Sleep apnea    Vitamin D deficiency      Past Surgical History:  Procedure Laterality Date   CESAREAN SECTION     CESAREAN SECTION     DILATION AND CURETTAGE OF UTERUS     x 2 TAB   ESOPHAGOGASTRODUODENOSCOPY (EGD) WITH PROPOFOL  Left 07/07/2015   Procedure: ESOPHAGOGASTRODUODENOSCOPY (EGD) WITH PROPOFOL ;  Surgeon: Lamar ONEIDA Holmes, MD;  Location: [facility] ENDOSCOPY;  Service: Endoscopy;  Laterality: Left;   ESOPHAGOGASTRODUODENOSCOPY (EGD) WITH PROPOFOL  N/A 09/05/2015   Procedure: ESOPHAGOGASTRODUODENOSCOPY (EGD) WITH PROPOFOL ;  Surgeon: Rogelia Copping, MD;  Location: [facility] ENDOSCOPY;  Service: Endoscopy;  Laterality: N/A;   ESOPHAGOGASTRODUODENOSCOPY (EGD) WITH PROPOFOL  N/A 03/15/2016   Procedure: ESOPHAGOGASTRODUODENOSCOPY (EGD) WITH PROPOFOL ;  Surgeon: Rogelia Copping, MD;   Location: [facility] ENDOSCOPY;  Service: Endoscopy;  Laterality: N/A;    Family History  Problem Relation Age of Onset   Lung cancer Father    Cancer Father        lung cancer   Alcohol abuse Father    Depression Mother    Anxiety disorder Mother    Arthritis Mother    Hyperlipidemia Mother    Asthma Son    Lung cancer Paternal Aunt    Cancer Paternal Aunt        lung   Bone cancer Paternal Uncle    Cancer Paternal Uncle        pancreatic   Hypertension Maternal Grandmother    Diabetes Paternal Grandmother    Heart disease Paternal Grandmother    Hypertension Paternal Grandmother    Vision loss Neg Hx    COPD Neg Hx     Social History[3]  ROS Refer to HPI for ROS details.  Objective:   Vitals: BP (!) 137/91 (BP Location: Right Arm)   Pulse 100   Temp 98.4 F (36.9 C) (Oral)   Resp 18   SpO2 97%   Physical Exam Vitals and nursing note reviewed.  Constitutional:      General: She is not in acute distress.    Appearance: Normal appearance. She is well-developed. She is not ill-appearing or toxic-appearing.  HENT:     Head: Normocephalic and atraumatic.  Cardiovascular:     Rate and Rhythm: Normal rate.  Pulmonary:     Effort: Pulmonary effort is normal.  No respiratory distress.  Skin:    General: Skin is warm and dry.  Neurological:     General: No focal deficit present.     Mental Status: She is alert and oriented to person, place, and time.  Psychiatric:        Mood and Affect: Mood normal.        Behavior: Behavior normal.        Thought Content: Thought content normal.        Judgment: Judgment normal.     Procedures  No results found for this or any previous visit (from the past 24 hours).  No results found.   Assessment and Plan :     Discharge Instructions       1. Medication refill (Primary) - ALPRAZolam  (XANAX ) 0.5 MG tablet; Take 1 tablet (0.5 mg total) by mouth 2 (two) times daily as needed.  Dispense: 16 tablet; Refill: 0 -  escitalopram  (LEXAPRO ) 20 MG tablet; Take 1 tablet (20 mg total) by mouth daily.  Dispense: 30 tablet; Refill: 1 - buprenorphine  (SUBUTEX ) 8 MG SUBL SL tablet; Place 3 tablets (24 mg total) under the tongue daily.  Dispense: 21 tablet; Refill: 0  -Continue to monitor symptoms for any change in severity if there is any escalation of current symptoms or development of new symptoms follow-up in ER for further evaluation, management or for further refills.       Jasma Seevers B Anaisha Mago    [1] No current facility-administered medications for this encounter.  Current Outpatient Medications:    ALPRAZolam  (XANAX ) 0.5 MG tablet, Take 1 tablet (0.5 mg total) by mouth 2 (two) times daily as needed., Disp: 16 tablet, Rfl: 0   buprenorphine  (SUBUTEX ) 8 MG SUBL SL tablet, Place 3 tablets (24 mg total) under the tongue daily., Disp: 21 tablet, Rfl: 0   escitalopram  (LEXAPRO ) 20 MG tablet, Take 1 tablet (20 mg total) by mouth daily., Disp: 30 tablet, Rfl: 1   gabapentin (NEURONTIN) 300 MG capsule, Take by mouth., Disp: , Rfl:    lisinopril (PRINIVIL,ZESTRIL) 20 MG tablet, Take 20 mg by mouth daily., Disp: , Rfl:    nitrofurantoin , macrocrystal-monohydrate, (MACROBID ) 100 MG capsule, Take 1 capsule (100 mg total) by mouth 2 (two) times daily., Disp: 10 capsule, Rfl: 0   ondansetron  (ZOFRAN  ODT) 4 MG disintegrating tablet, Take 1 tablet (4 mg total) by mouth every 8 (eight) hours as needed., Disp: 20 tablet, Rfl: 0   phenazopyridine  (PYRIDIUM ) 100 MG tablet, Take 1 tablet (100 mg total) by mouth 3 (three) times daily as needed (urinary pain)., Disp: 10 tablet, Rfl: 0   PREMARIN vaginal cream, SMARTSIG:0.5 Gram(s) Vaginal 3 Times a Week, Disp: , Rfl:    tiZANidine (ZANAFLEX) 4 MG tablet, Take 4 mg by mouth 2 (two) times daily as needed., Disp: , Rfl:  [2] No Known Allergies [3]  Social History Tobacco Use   Smoking status: Every Day    Current packs/day: 1.00    Average packs/day: 1 pack/day for 17.0 years  (17.0 ttl pk-yrs)    Types: Cigarettes   Smokeless tobacco: Never  Vaping Use   Vaping status: Never Used  Substance Use Topics   Alcohol use: Yes    Comment: occasional   Drug use: Yes    Frequency: 7.0 times per week    Types: Marijuana     Aurea Goodell B, NP 11/19/24 1912  "

## 2024-11-28 ENCOUNTER — Telehealth: Payer: Self-pay | Admitting: *Deleted

## 2024-11-28 NOTE — Telephone Encounter (Signed)
 Message from pt access:  Laurie Frederick was seen last week by Hosey and he told her if she had trouble getting her Buprenorphine  refilled than contact him. She has a pcp appointment scheduled 12/26/24 with [facility] but she does not want to go into withdrawal (she has one pill left). Please assist. I made her an appointment for tomorrow in the meantime.  I spoke with Laurie Frederick. She states she made an appointment for tomorrow and did not need any further assistance at this time.

## 2024-11-29 ENCOUNTER — Ambulatory Visit

## 2024-12-02 ENCOUNTER — Ambulatory Visit
Admission: EM | Admit: 2024-12-02 | Discharge: 2024-12-02 | Discharge status: Against Medical Advice | Source: Home / Self Care

## 2024-12-02 ENCOUNTER — Ambulatory Visit (HOSPITAL_COMMUNITY)
Admission: EM | Admit: 2024-12-02 | Discharge: 2024-12-02 | Disposition: A | Discharge status: Home / Self Care | Source: Home / Self Care | Attending: Psychiatry | Admitting: Psychiatry

## 2024-12-02 DIAGNOSIS — Z760 Encounter for issue of repeat prescription: Secondary | ICD-10-CM | POA: Diagnosis not present

## 2024-12-02 DIAGNOSIS — F411 Generalized anxiety disorder: Secondary | ICD-10-CM

## 2024-12-02 MED ORDER — BUPRENORPHINE HCL 8 MG SL SUBL: 8.0000 mg | SUBLINGUAL_TABLET | Freq: Three times a day (TID) | SUBLINGUAL | 0 refills | Status: AC

## 2024-12-02 MED ORDER — BUPRENORPHINE HCL 8 MG SL SUBL: 8.0000 mg | SUBLINGUAL_TABLET | Freq: Every day | SUBLINGUAL | 0 refills | Status: DC

## 2024-12-02 NOTE — Discharge Instructions (Signed)

## 2024-12-02 NOTE — BH Assessment (Incomplete)
 Laurie Frederick

## 2024-12-02 NOTE — Progress Notes (Signed)
" °   12/02/24 1317  [facility] Triage Screening (Walk-ins at [facility] only)  What Is the Reason for Your Visit/Call Today? Laurie Frederick is a 49 year old female who presented to the [facility] via self-referral requesting a refill of buprenorphine  8 mg to prevent withdrawal. She has a history of depression and anxiety and reports her medications were previously managed by [facility]; however, her insurance changed to [facility] at the beginning of the year, requiring her to establish care with new medical and psychiatric providers. She states she was recently seen at an urgent care and given a short supply of medication, then returned today to the same urgent care for additional medication but was informed they would not provide another refill, prompting her to present to [facility] for a bridge prescription, as her new PCP appointment is scheduled for December 26, 2024 and her current supply will not last until then. The patient denies current suicidal ideation but reports a prior suicide attempt by overdose in 2021; she denies homicidal ideation and auditory or visual hallucinations. She denies alcohol use and reports THC use approximately twice weekly for pain and anxiety. The patients goal is to obtain a refill to prevent buprenorphine  withdrawal until she is able to establish ongoing care.  How Long Has This Been Causing You Problems? 1 wk - 1 month  Have You Recently Had Any Thoughts About Hurting Yourself? No  Are You Planning to Commit Suicide/Harm Yourself At This time? No  Have you Recently Had Thoughts About Hurting Someone Sherral? No  Are You Planning To Harm Someone At This Time? No  Physical Abuse Denies  Sexual Abuse Denies  Exploitation of patient/patient's resources Denies  Self-Neglect Denies  Are you currently experiencing any auditory, visual or other hallucinations? No  Have You Used Any Alcohol or Drugs in the Past 24 Hours? Yes  What Did You Use and How Much? THC,unknown amount  Do  you have any current medical co-morbidities that require immediate attention? Yes  Please describe current medical co-morbidities that require immediate attention: Patient appears to be in alot of pain evidenced by facial grimaces.  Clinician description of patient physical appearance/behavior: Patient presents anxious but cooperative.  What Do You Feel Would Help You the Most Today? Treatment for Depression or other mood problem;Stress Management  If access to [facility] was not available, would you have sought care in the Emergency Department? Yes  Determination of Need Routine (7 days)  Options For Referral Medication Management    "

## 2024-12-04 NOTE — ED Provider Notes (Cosign Needed)
 Behavioral Health Urgent Care Medical Screening Exam  Patient Name: Laurie Frederick MRN: [id number] Date of Evaluation: 12/04/24 Chief Complaint:   Diagnosis:  Final diagnoses:  GAD (generalized anxiety disorder)    History of Present illness: Laurie Frederick is a 49 y.o. female. Requesting refill/bridge prescription of buprenorphine  to prevent withdrawal.  The patient is a 49 year old female who presented to [facility] via self-referral requesting a refill of buprenorphine  8 mg to prevent opioid withdrawal. She reports a history of depression and anxiety and states that her medications were previously managed by [facility]. She reports that her insurance changed to [facility] at the beginning of the year, requiring her to establish care with new primary care and psychiatric providers.  She reports she was recently seen at a home health urgent care and provided a short supply of buprenorphine . She returned to that same urgent care today requesting additional medication but was informed that they would not provide another refill, prompting her to present to [facility] seeking a bridge prescription. She reports that she has a pain management appointment scheduled for December 08, 2024, and a new primary care appointment scheduled for December 26, 2024.  During evaluation, the patient reports she is prescribed Subutex  (buprenorphine ) 8 mg three times daily for chronic back pain. 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 Her stated goal is to prevent withdrawal until she is able to attend her upcoming pain management appointment.  The patient denies current suicidal ideation but reports a prior suicide attempt by overdose in 2021. She denies homicidal ideation and denies auditory or visual hallucinations. She denies alcohol use and reports THC use approximately twice weekly for pain and anxiety.  Patient was appropriate, cooperative, and pleasant throughout the assessment. No  overt signs of intoxication, withdrawal, or acute psychiatric decompensation were observed.  Past Psychiatric History: Depression Anxiety Prior suicide attempt by overdose (2021)  Substance Use History: Denies alcohol use THC use approximately twice weekly Prescribed buprenorphine  for chronic pain  Risk Assessment: Suicide Risk: Low (chronic risk elevated given past attempt; no acute ideation) Homicide Risk: Low Protective Factors: Seeking help, upcoming appointments, employment history, cooperative with care Acute Risk Factors: Medication access disruption  Plan / Recommendations: Patient does not meet criteria for inpatient psychiatric hospitalization. Patient psychiatrically cleared from [facility]gent [facility] standpoint. After consultation with collaborating psychiatrist (Dr. Cole), prescribed Subutex  (buprenorphine ) 8 mg PO three times daily for 7 days to [facility] on [address] as a bridge until pain management appointment on December 08, 2024 in [place], [place], [place]. Provided education regarding medication use, risks, and overdose precautions. Provided resources for GCSTOP and ADS for assistance with buprenorphine  services in the future. Encouraged to keep scheduled pain management and primary care appointments. Provided return precautions for worsening mood symptoms, withdrawal symptoms, SI/HI, or psychosis.   Flowsheet Row ED from 12/02/2024 in [facility] UC from 11/19/2024 in [facility] at [facility] UC from 07/08/2024 in [facility] at [facility] RISK CATEGORY No Risk No Risk No Risk    Psychiatric Specialty Exam  Presentation  General Appearance:No data recorded Eye Contact:No data recorded Speech:No data recorded Speech Volume:No data recorded Handedness:No data recorded  Mood and Affect  Mood: Euthymic; Anxious  Affect: Appropriate   Thought Process  Thought  Processes: Coherent  Descriptions of Associations:Intact  Orientation:Full (Time, Place and Person)  Thought Content:Logical; WDL    Hallucinations:None  Ideas  of Reference:None  Suicidal Thoughts:No  Homicidal Thoughts:No   Sensorium  Memory: Immediate Good  Judgment: Good  Insight: Fair   Executive Functions  Concentration: Good  Attention Span: Good  Recall: Good  Fund of Knowledge: Good  Language: Good   Psychomotor Activity  Psychomotor Activity: Normal   Assets  Assets: Communication Skills; Desire for Improvement; Housing; Resilience; Social Support   Sleep  Sleep: Good  Number of hours: No data recorded  Physical Exam: Physical Exam ROS Blood pressure 107/73, pulse 76, temperature 98.5 F (36.9 C), temperature source Oral, resp. rate 17, SpO2 97%. There is no height or weight on file to calculate BMI.  Musculoskeletal: Strength & Muscle Tone: within normal limits Gait & Station: normal Patient leans: N/A   [facility] MSE Discharge Disposition for Follow up and Recommendations: Based on my evaluation the patient does not appear to have an emergency medical condition and can be discharged with resources and follow up care in outpatient services for Medication Management and Patient psychiatrically cleared from [facility] Urgent Care standpoint. After consultation with collaborating psychiatrist (Dr. Cole), prescribed Subutex  (buprenorphine ) 8 mg PO three times daily for 7 days to [facility] on [address] as a bridge until pain management appointment on December 08, 2024 in [place], [place], [place]. Provided education regarding medication use, risks, and overdose precautions. Provided resources for GCSTOP and ADS for assistance with buprenorphine  services in the future. Encouraged to keep scheduled pain management and primary care appointments.   Dayjah Selman MOTLEY-MANGRUM, PMHNP 12/04/2024, 10:00 AM

## 2025-01-01 ENCOUNTER — Ambulatory Visit: Admitting: Family
